# Patient Record
Sex: Male | Born: 1975 | Race: White | Hispanic: No | Marital: Married | Smoking: Never smoker
Health system: Southern US, Community
[De-identification: ages and names within clinical notes are randomized; demographics above are authoritative.]

## PROBLEM LIST (undated history)

## (undated) DIAGNOSIS — F32A Depression, unspecified: Secondary | ICD-10-CM

## (undated) DIAGNOSIS — I10 Essential (primary) hypertension: Secondary | ICD-10-CM

## (undated) DIAGNOSIS — C801 Malignant (primary) neoplasm, unspecified: Secondary | ICD-10-CM

## (undated) DIAGNOSIS — I951 Orthostatic hypotension: Secondary | ICD-10-CM

## (undated) DIAGNOSIS — I341 Nonrheumatic mitral (valve) prolapse: Secondary | ICD-10-CM

## (undated) DIAGNOSIS — R011 Cardiac murmur, unspecified: Secondary | ICD-10-CM

## (undated) DIAGNOSIS — R634 Abnormal weight loss: Secondary | ICD-10-CM

## (undated) DIAGNOSIS — S3992XA Unspecified injury of lower back, initial encounter: Secondary | ICD-10-CM

## (undated) DIAGNOSIS — K219 Gastro-esophageal reflux disease without esophagitis: Secondary | ICD-10-CM

## (undated) DIAGNOSIS — G629 Polyneuropathy, unspecified: Secondary | ICD-10-CM

## (undated) DIAGNOSIS — Z923 Personal history of irradiation: Secondary | ICD-10-CM

## (undated) HISTORY — DX: Depression, unspecified: F32.A

## (undated) HISTORY — PX: ELBOW FRACTURE SURGERY: SHX616

## (undated) HISTORY — DX: Malignant (primary) neoplasm, unspecified: C80.1

## (undated) HISTORY — DX: Nonrheumatic mitral (valve) prolapse: I34.1

## (undated) HISTORY — DX: Unspecified injury of lower back, initial encounter: S39.92XA

## (undated) HISTORY — PX: ELBOW SURGERY: SHX618

## (undated) HISTORY — DX: Personal history of irradiation: Z92.3

---

## 2002-10-16 DIAGNOSIS — S3992XA Unspecified injury of lower back, initial encounter: Secondary | ICD-10-CM

## 2002-10-16 HISTORY — DX: Unspecified injury of lower back, initial encounter: S39.92XA

## 2012-10-22 ENCOUNTER — Emergency Department: Payer: Self-pay | Admitting: Emergency Medicine

## 2012-10-22 LAB — CBC
HGB: 15.7 g/dL (ref 13.0–18.0)
Platelet: 254 10*3/uL (ref 150–440)
RBC: 5.21 10*6/uL (ref 4.40–5.90)
RDW: 12.7 % (ref 11.5–14.5)
WBC: 9.6 10*3/uL (ref 3.8–10.6)

## 2012-10-22 LAB — BASIC METABOLIC PANEL
BUN: 16 mg/dL (ref 7–18)
Chloride: 103 mmol/L (ref 98–107)
Co2: 27 mmol/L (ref 21–32)
Creatinine: 1.04 mg/dL (ref 0.60–1.30)
EGFR (African American): >60
Glucose: 113 mg/dL — ABNORMAL HIGH (ref 65–99)
Osmolality: 281 (ref 275–301)
Potassium: 3.9 mmol/L (ref 3.5–5.1)
Sodium: 140 mmol/L (ref 136–145)

## 2012-10-22 LAB — TROPONIN I: Troponin-I: <0.02 ng/mL

## 2014-01-07 ENCOUNTER — Emergency Department: Payer: Self-pay | Admitting: Emergency Medicine

## 2015-09-09 ENCOUNTER — Encounter (HOSPITAL_COMMUNITY): Payer: Self-pay | Admitting: Emergency Medicine

## 2015-09-09 ENCOUNTER — Emergency Department (HOSPITAL_COMMUNITY)
Admission: EM | Admit: 2015-09-09 | Discharge: 2015-09-09 | Disposition: A | Discharge status: Home / Self Care | Payer: Worker's Compensation | Attending: Emergency Medicine | Admitting: Emergency Medicine

## 2015-09-09 ENCOUNTER — Emergency Department (HOSPITAL_COMMUNITY): Payer: Worker's Compensation

## 2015-09-09 DIAGNOSIS — W208XXA Other cause of strike by thrown, projected or falling object, initial encounter: Secondary | ICD-10-CM | POA: Insufficient documentation

## 2015-09-09 DIAGNOSIS — Y9389 Activity, other specified: Secondary | ICD-10-CM | POA: Diagnosis not present

## 2015-09-09 DIAGNOSIS — Y998 Other external cause status: Secondary | ICD-10-CM | POA: Diagnosis not present

## 2015-09-09 DIAGNOSIS — S99922A Unspecified injury of left foot, initial encounter: Secondary | ICD-10-CM | POA: Diagnosis present

## 2015-09-09 DIAGNOSIS — Y9289 Other specified places as the place of occurrence of the external cause: Secondary | ICD-10-CM | POA: Diagnosis not present

## 2015-09-09 DIAGNOSIS — Z8679 Personal history of other diseases of the circulatory system: Secondary | ICD-10-CM | POA: Diagnosis not present

## 2015-09-09 DIAGNOSIS — S9032XA Contusion of left foot, initial encounter: Secondary | ICD-10-CM | POA: Diagnosis not present

## 2015-09-09 HISTORY — DX: Nonrheumatic mitral (valve) prolapse: I34.1

## 2015-09-09 MED ORDER — HYDROCODONE-ACETAMINOPHEN 5-325 MG PO TABS: 2.0000 | ORAL_TABLET | ORAL | Status: DC | PRN

## 2015-09-09 MED ORDER — ACETAMINOPHEN 325 MG PO TABS
650.0000 mg | ORAL_TABLET | Freq: Once | ORAL | Status: AC
  Administered 2015-09-09: 650 mg via ORAL
  Filled 2015-09-09: qty 2

## 2015-09-09 NOTE — Discharge Instructions (Signed)
Cryotherapy Cryotherapy means treatment with cold. Ice or gel packs can be used to reduce both pain and swelling. Ice is the most helpful within the first 24 to 48 hours after an injury or flare-up from overusing a muscle or joint. Sprains, strains, spasms, burning pain, shooting pain, and aches can all be eased with ice. Ice can also be used when recovering from surgery. Ice is effective, has very few side effects, and is safe for most people to use. PRECAUTIONS  Ice is not a safe treatment option for people with:  Raynaud phenomenon. This is a condition affecting small blood vessels in the extremities. Exposure to cold may cause your problems to return.  Cold hypersensitivity. There are many forms of cold hypersensitivity, including:  Cold urticaria. Red, itchy hives appear on the skin when the tissues begin to warm after being iced.  Cold erythema. This is a red, itchy rash caused by exposure to cold.  Cold hemoglobinuria. Red blood cells break down when the tissues begin to warm after being iced. The hemoglobin that carry oxygen are passed into the urine because they cannot combine with blood proteins fast enough.  Numbness or altered sensitivity in the area being iced. If you have any of the following conditions, do not use ice until you have discussed cryotherapy with your caregiver:  Heart conditions, such as arrhythmia, angina, or chronic heart disease.  High blood pressure.  Healing wounds or open skin in the area being iced.  Current infections.  Rheumatoid arthritis.  Poor circulation.  Diabetes. Ice slows the blood flow in the region it is applied. This is beneficial when trying to stop inflamed tissues from spreading irritating chemicals to surrounding tissues. However, if you expose your skin to cold temperatures for too long or without the proper protection, you can damage your skin or nerves. Watch for signs of skin damage due to cold. HOME CARE INSTRUCTIONS Follow  these tips to use ice and cold packs safely.  Place a dry or damp towel between the ice and skin. A damp towel will cool the skin more quickly, so you may need to shorten the time that the ice is used.  For a more rapid response, add gentle compression to the ice.  Ice for no more than 10 to 20 minutes at a time. The bonier the area you are icing, the less time it will take to get the benefits of ice.  Check your skin after 5 minutes to make sure there are no signs of a poor response to cold or skin damage.  Rest 20 minutes or more between uses.  Once your skin is numb, you can end your treatment. You can test numbness by very lightly touching your skin. The touch should be so light that you do not see the skin dimple from the pressure of your fingertip. When using ice, most people will feel these normal sensations in this order: cold, burning, aching, and numbness.  Do not use ice on someone who cannot communicate their responses to pain, such as small children or people with dementia. HOW TO MAKE AN ICE PACK Ice packs are the most common way to use ice therapy. Other methods include ice massage, ice baths, and cryosprays. Muscle creams that cause a cold, tingly feeling do not offer the same benefits that ice offers and should not be used as a substitute unless recommended by your caregiver. To make an ice pack, do one of the following:  Place crushed ice or a  bag of frozen vegetables in a sealable plastic bag. Squeeze out the excess air. Place this bag inside another plastic bag. Slide the bag into a pillowcase or place a damp towel between your skin and the bag.  Mix 3 parts water with 1 part rubbing alcohol. Freeze the mixture in a sealable plastic bag. When you remove the mixture from the freezer, it will be slushy. Squeeze out the excess air. Place this bag inside another plastic bag. Slide the bag into a pillowcase or place a damp towel between your skin and the bag. SEEK MEDICAL CARE  IF:  You develop white spots on your skin. This may give the skin a blotchy (mottled) appearance.  Your skin turns blue or pale.  Your skin becomes waxy or hard.  Your swelling gets worse. MAKE SURE YOU:   Understand these instructions.  Will watch your condition.  Will get help right away if you are not doing well or get worse.   This information is not intended to replace advice given to you by your health care provider. Make sure you discuss any questions you have with your health care provider.   Document Released: 05/29/2011 Document Revised: 10/23/2014 Document Reviewed: 05/29/2011 Elsevier Interactive Patient Education 2016 [place] Contusion A foot contusion is a deep bruise to the foot. Contusions are the result of an injury that caused bleeding under the skin. The contusion may turn blue, purple, or yellow. Minor injuries will give you a painless contusion, but more severe contusions may stay painful and swollen for a few weeks. CAUSES  A foot contusion comes from a direct blow to that area, such as a heavy object falling on the foot. SYMPTOMS   Swelling of the foot.  Discoloration of the foot.  Tenderness or soreness of the foot. DIAGNOSIS  You will have a physical exam and will be asked about your history. You may need an X-ray of your foot to look for a broken bone (fracture).  TREATMENT  An elastic wrap may be recommended to support your foot. Resting, elevating, and applying cold compresses to your foot are often the best treatments for a foot contusion. Over-the-counter medicines may also be recommended for pain control. HOME CARE INSTRUCTIONS   Put ice on the injured area.  Put ice in a plastic bag.  Place a towel between your skin and the bag.  Leave the ice on for 15-20 minutes, 03-04 times a day.  Only take over-the-counter or prescription medicines for pain, discomfort, or fever as directed by your caregiver.  If told, use an elastic  wrap as directed. This can help reduce swelling. You may remove the wrap for sleeping, showering, and bathing. If your toes become numb, cold, or blue, take the wrap off and reapply it more loosely.  Elevate your foot with pillows to reduce swelling.  Try to avoid standing or walking while the foot is painful. Do not resume use until instructed by your caregiver. Then, begin use gradually. If pain develops, decrease use. Gradually increase activities that do not cause discomfort until you have normal use of your foot.  See your caregiver as directed. It is very important to keep all follow-up appointments in order to avoid any lasting problems with your foot, including long-term (chronic) pain. SEEK IMMEDIATE MEDICAL CARE IF:   You have increased redness, swelling, or pain in your foot.  Your swelling or pain is not relieved with medicines.  You have loss of feeling in your foot  or are unable to move your toes.  Your foot turns cold or blue.  You have pain when you move your toes.  Your foot becomes warm to the touch.  Your contusion does not improve in 2 days. MAKE SURE YOU:   Understand these instructions.  Will watch your condition.  Will get help right away if you are not doing well or get worse.   This information is not intended to replace advice given to you by your health care provider. Make sure you discuss any questions you have with your health care provider.   Document Released: 07/24/2006 Document Revised: 04/02/2012 Document Reviewed: 06/08/2015 Elsevier Interactive Patient Education [employer].

## 2015-09-09 NOTE — ED Notes (Signed)
Pt c/o left foot injury onset today 0700 after a heavy object landed on his distal foot which was hanging off the end of a curb. Motor function, sensation intact, pedal pulses 2+.

## 2015-09-09 NOTE — ED Notes (Signed)
Patient transported to X-ray 
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
## 2015-10-17 HISTORY — PX: TONSILLECTOMY: SHX28A

## 2016-03-03 ENCOUNTER — Inpatient Hospital Stay: Admit: 2016-03-03 | Discharge: 2016-03-03 | Disposition: A | Discharge status: Home / Self Care | Payer: Self-pay

## 2016-03-03 ENCOUNTER — Other Ambulatory Visit: Payer: Self-pay | Admitting: Gastroenterology

## 2016-03-03 ENCOUNTER — Ambulatory Visit
Admission: RE | Admit: 2016-03-03 | Discharge: 2016-03-03 | Disposition: A | Discharge status: Home / Self Care | Payer: Managed Care, Other (non HMO) | Source: Ambulatory Visit | Attending: Urgent Care | Admitting: Urgent Care

## 2016-03-03 ENCOUNTER — Other Ambulatory Visit: Payer: Self-pay | Admitting: Urgent Care

## 2016-03-03 DIAGNOSIS — R220 Localized swelling, mass and lump, head: Secondary | ICD-10-CM

## 2016-03-03 DIAGNOSIS — R221 Localized swelling, mass and lump, neck: Secondary | ICD-10-CM | POA: Diagnosis not present

## 2016-03-03 DIAGNOSIS — R590 Localized enlarged lymph nodes: Secondary | ICD-10-CM | POA: Insufficient documentation

## 2016-03-03 MED ORDER — IOPAMIDOL (ISOVUE-300) INJECTION 61%
75.0000 mL | Freq: Once | INTRAVENOUS | Status: AC | PRN
  Administered 2016-03-03: 75 mL via INTRAVENOUS

## 2016-03-12 ENCOUNTER — Inpatient Hospital Stay: Admit: 2016-03-12 | Discharge: 2016-03-12 | Disposition: A | Discharge status: Home / Self Care | Payer: Self-pay

## 2016-03-12 ENCOUNTER — Encounter (HOSPITAL_COMMUNITY): Payer: Self-pay | Admitting: *Deleted

## 2016-03-12 ENCOUNTER — Emergency Department (HOSPITAL_COMMUNITY)
Admission: EM | Admit: 2016-03-12 | Discharge: 2016-03-12 | Disposition: A | Discharge status: Home / Self Care | Payer: Managed Care, Other (non HMO) | Attending: Emergency Medicine | Admitting: Emergency Medicine

## 2016-03-12 ENCOUNTER — Emergency Department (HOSPITAL_COMMUNITY): Payer: Managed Care, Other (non HMO)

## 2016-03-12 DIAGNOSIS — Z791 Long term (current) use of non-steroidal anti-inflammatories (NSAID): Secondary | ICD-10-CM | POA: Insufficient documentation

## 2016-03-12 DIAGNOSIS — R079 Chest pain, unspecified: Secondary | ICD-10-CM | POA: Diagnosis present

## 2016-03-12 DIAGNOSIS — R590 Localized enlarged lymph nodes: Secondary | ICD-10-CM | POA: Diagnosis not present

## 2016-03-12 LAB — CBC
HCT: 41.6 % (ref 39.0–52.0)
Hemoglobin: 14.4 g/dL (ref 13.0–17.0)
MCH: 29.8 pg (ref 26.0–34.0)
MCHC: 34.6 g/dL (ref 30.0–36.0)
MCV: 86.1 fL (ref 78.0–100.0)
PLATELETS: 227 10*3/uL (ref 150–400)
RBC: 4.83 MIL/uL (ref 4.22–5.81)
RDW: 12.5 % (ref 11.5–15.5)
WBC: 7.4 10*3/uL (ref 4.0–10.5)

## 2016-03-12 LAB — RAPID STREP SCREEN (MED CTR MEBANE ONLY): Streptococcus, Group A Screen (Direct): NEGATIVE

## 2016-03-12 LAB — BASIC METABOLIC PANEL
Anion gap: 6 (ref 5–15)
BUN: 20 mg/dL (ref 6–20)
CALCIUM: 9.4 mg/dL (ref 8.9–10.3)
CO2: 28 mmol/L (ref 22–32)
Chloride: 104 mmol/L (ref 101–111)
Creatinine, Ser: 0.98 mg/dL (ref 0.61–1.24)
GFR calc Af Amer: >60 mL/min (ref >60)
GLUCOSE: 118 mg/dL — AB (ref 65–99)
Potassium: 3.4 mmol/L — ABNORMAL LOW (ref 3.5–5.1)
SODIUM: 138 mmol/L (ref 135–145)

## 2016-03-12 LAB — I-STAT TROPONIN, ED
TROPONIN I, POC: 0 ng/mL (ref 0.00–0.08)
Troponin i, poc: 0 ng/mL (ref 0.00–0.08)

## 2016-03-12 LAB — MONONUCLEOSIS SCREEN: Mono Screen: NEGATIVE

## 2016-03-12 MED ORDER — KETOROLAC TROMETHAMINE 30 MG/ML IJ SOLN: 30.0000 mg | Freq: Once | INTRAMUSCULAR | Status: DC

## 2016-03-12 MED ORDER — NAPROXEN 500 MG PO TABS: 500.0000 mg | ORAL_TABLET | Freq: Two times a day (BID) | ORAL | Status: DC

## 2016-03-12 MED ORDER — AMOXICILLIN 500 MG PO CAPS
500.0000 mg | ORAL_CAPSULE | Freq: Once | ORAL | Status: AC
  Administered 2016-03-12: 500 mg via ORAL
  Filled 2016-03-12: qty 1

## 2016-03-12 MED ORDER — AMOXICILLIN 500 MG PO CAPS: 500.0000 mg | ORAL_CAPSULE | Freq: Three times a day (TID) | ORAL | Status: DC

## 2016-03-12 MED ORDER — IOPAMIDOL (ISOVUE-370) INJECTION 76%
100.0000 mL | Freq: Once | INTRAVENOUS | Status: AC | PRN
  Administered 2016-03-12: 100 mL via INTRAVENOUS

## 2016-03-12 NOTE — ED Notes (Signed)
Pt states he was trying to go to sleep tonight and he was lying on his side, started having cp and sob. Pt also concerned about lump he has on the left side of his neck. Denies fevers and 1 episode of vomiting.

## 2016-03-12 NOTE — ED Provider Notes (Signed)
CSN: [id number]     Arrival date & time 03/12/16  0301 History  By signing my name below, I, [name], attest that this documentation has been prepared under the direction and in the presence of Devynne Sturdivant, MD.  Electronically Signed: Tedra Coupe. Sheppard Coil, ED Scribe. 03/12/2016. 3:13 AM.    No chief complaint on file.   Patient is a 40 y.o. male presenting with chest pain. The history is provided by the patient. No language interpreter was used.  Chest Pain Pain location:  L chest Pain quality: sharp   Pain radiates to:  Does not radiate Pain radiates to the back: no   Pain severity:  Moderate Onset quality:  Sudden Duration:  2 hours Timing:  Intermittent Progression:  Partially resolved Chronicity:  New Worsened by:  Nothing tried Associated symptoms: no altered mental status, no cough, no dizziness, no fever, no lower extremity edema, no palpitations, no syncope, not vomiting and no weakness   Risk factors: male sex   Risk factors: no aortic disease     HPI Comments: [name] is a 40 y.o. male who presents to the Emergency Department complaining of sudden onset, intermittent, sharp chest pain with associated mild shortness of breath which came on PTA. Pt notes that while he was trying to sleep, he started having chest pain and shortness of breath. He reports possible weight loss of approximately 8-10 pounds. He admits to being pain-free at this time. He also reports left sided neck pain with a noted swollen "mass" to the left neck. He states his neck is warm to the touch and slightly numb surrounding mass. He reports having a headache for the past 3 days with no relief from medication. Pt denies any fever or swelling in legs.   Past Medical History  Diagnosis Date  . Mitral valve prolapse    No past surgical history on file. No family history on file. Social History  Substance Use Topics  . Smoking status: Not on file  . Smokeless tobacco: Not on file  .  Alcohol Use: Not on file    Review of Systems  Constitutional: Negative for fever and unexpected weight change.  HENT: Negative for sore throat and voice change.   Respiratory: Negative for cough.   Cardiovascular: Positive for chest pain. Negative for palpitations, leg swelling and syncope.  Gastrointestinal: Negative for vomiting and diarrhea.  Musculoskeletal: Positive for neck pain.  Neurological: Negative for dizziness and weakness.  All other systems reviewed and are negative.    Allergies  Review of patient's allergies indicates no known allergies.  Home Medications   Prior to Admission medications   Medication Sig Start Date End Date Taking? Authorizing Provider  HYDROcodone-acetaminophen (NORCO/VICODIN) 5-325 MG tablet Take 2 tablets by mouth every 4 (four) hours as needed. 09/09/15   Lacretia Leigh, MD  ibuprofen (ADVIL,MOTRIN) 200 MG tablet Take 200 mg by mouth every 6 (six) hours as needed for moderate pain.    Historical Provider, MD   There were no vitals taken for this visit. Physical Exam  Constitutional: He is oriented to person, place, and time. He appears well-developed and well-nourished.  HENT:  Head: Normocephalic and atraumatic.  Eyes: EOM are normal. Pupils are equal, round, and reactive to light.  Neck: Normal range of motion.  2xm, freely mobile mass on left neck  Cardiovascular: Normal rate, regular rhythm, normal heart sounds and intact distal pulses.   Pulmonary/Chest: Effort normal and breath sounds normal. No respiratory distress. He has  no wheezes. He has no rales.  Abdominal: Soft. Bowel sounds are normal. He exhibits no distension. There is no tenderness. There is no rebound and no guarding.  Gassy.   Musculoskeletal: Normal range of motion.  Lymphadenopathy:       Head (right side): No submental, no submandibular, no tonsillar, no preauricular, no posterior auricular and no occipital adenopathy present.       Head (left side): No submental, no  submandibular, no tonsillar, no preauricular, no posterior auricular and no occipital adenopathy present.    He has cervical adenopathy.       Right cervical: No superficial cervical and no deep cervical adenopathy present.      Left cervical: Superficial cervical adenopathy present. No deep cervical and no posterior cervical adenopathy present.    He has no axillary adenopathy.       Right: No inguinal, no supraclavicular and no epitrochlear adenopathy present.       Left: No inguinal, no supraclavicular and no epitrochlear adenopathy present.  Neurological: He is alert and oriented to person, place, and time.  Skin: Skin is warm and dry. No rash noted.  Psychiatric: He has a normal mood and affect. Judgment normal.  Nursing note and vitals reviewed.   ED Course  Procedures (including critical care time) DIAGNOSTIC STUDIES: Oxygen Saturation is 100% on RA, normal by my interpretation.    COORDINATION OF CARE: 3:13 AM-Discussed treatment plan which includes strep test, blood works, EKG, and CT with contrast  with pt at bedside and pt agreed to plan. Will receive Toradol.  Labs Review Labs Reviewed - No data to display  Imaging Review No results found. I have personally reviewed and evaluated these images and lab results as part of my medical decision-making.   EKG Interpretation None      MDM   Final diagnoses:  None    EKG Interpretation  Date/Time:    Ventricular Rate:    PR Interval:    QRS Duration:   QT Interval:    QTC Calculation:   R Axis:     Text Interpretation:        Results for orders placed or performed in visit on 10/22/12  CBC  Result Value Ref Range   WBC 9.6 3.8-10.6 x10 3/mm 3   RBC 5.21 4.40-5.90 x10 6/mm 3   HGB 15.7 13.0-18.0 g/dL   HCT 46.1 40.0-52.0 %   MCV 88 80-100 fL   MCH 30.2 26.0-34.0 pg   MCHC 34.1 32.0-36.0 g/dL   RDW 12.7 11.5-14.5 %   Platelet 254 150-440 x10 3/mm 3  Basic metabolic panel  Result Value Ref Range    Glucose 113 (H) 65-99 mg/dL   BUN 16 7-18 mg/dL   Creatinine 1.04 0.60-1.30 mg/dL   Sodium 140 136-145 mmol/L   Potassium 3.9 3.5-5.1 mmol/L   Chloride 103 98-107 mmol/L   Co2 27 21-32 mmol/L   Calcium, Total 8.8 8.5-10.1 mg/dL   Osmolality 281 275-301   Anion Gap 10 7-16   EGFR (African American) >60    EGFR (Non-African Amer.) >60   Troponin I  Result Value Ref Range   Troponin-I < 0.02 ng/mL   Ct Soft Tissue Neck W Contrast  03/03/2016  CLINICAL DATA:  Swelling in the left neck for 3 days. States similar symptoms previously when sick. EXAM: CT NECK WITH CONTRAST TECHNIQUE: Multidetector CT imaging of the neck was performed using the standard protocol following the bolus administration of intravenous contrast. CONTRAST:  3m ISOVUE-300  IOPAMIDOL (ISOVUE-300) INJECTION 61% COMPARISON:  None. FINDINGS: Pharynx and larynx: Symmetric prominence of palatine and lingual tonsils with tonsillith in the left tonsillar fossa. There is no definitive mass lesion. The larynx is deviated to the right compared to the hyoid with mildly concave appearance of the left thyroidal cartilage, favored posttraumatic. No airway narrowing. Salivary glands: Negative Thyroid: Sub cm cyst or nodule in the left lobe that is too small for follow-up purposes. Lymph nodes: Enlarged left jugulodigastric node with fairly homogeneous enhancement. The rounded node measures 18 mm in diameter and 29 mm length. Vascular: Negative Limited intracranial: Negative Visualized orbits: Negative Mastoids and visualized paranasal sinuses: Clear. Notable leftward nasal septal spurring contacting the middle turbinate. Skeleton: Remote T4 superior endplate fracture. No acute or aggressive process. Upper chest: Clear apical lungs.  Negative upper mediastinum. IMPRESSION: Left jugulodigastric adenopathy correlating with palpable complaint. If there is pharyngitis/URI (tonsils are symmetrically prominent for age) this could be a reactive node. If no  associated illness or if node persists, recommend biopsy to exclude lymphoma or metastasis. Electronically Signed   By: Monte Fantasia M.D.   On: 03/03/2016 17:10    Medications  ketorolac (TORADOL) 30 MG/ML injection 30 mg (not administered)   Filed Vitals:   03/12/16 0318  BP: 147/95  Pulse: 82  Temp: 98 F (36.7 C)  Resp: 16      Date: 03/12/2016  Rate: 80  Rhythm: normal sinus rhythm  QRS Axis: normal  Intervals: normal  ST/T Wave abnormalities: normal  Conduction Disutrbances: none  Narrative Interpretation: unremarkable  Medications  ketorolac (TORADOL) 30 MG/ML injection 30 mg (30 mg Intravenous Not Given 03/12/16 0457)  amoxicillin (AMOXIL) capsule 500 mg (not administered)  iopamidol (ISOVUE-370) 76 % injection 100 mL (100 mLs Intravenous Contrast Given 03/12/16 0452)    Patient is well appearing and has normal vitals and a full lymph exam was conducted and there were no additional lymph nodes.  I suspect this node is reactive, likely from an infection.  Will start NSAIDs and amoxicillin for 7 days.  Will have patient follow up with ENT for potential biopsy as directed on neck CT.  Ruled out for MI in the Ed with a normal EKG and 2 normal delta troponins.  HEART score is 1 and patient is stable for discharge.  Follow up with your PMD for ongoing care.    I personally performed the services described in this documentation, which was scribed in my presence. The recorded information has been reviewed and is accurate.      Veatrice Kells, MD 03/12/16 [phone]

## 2016-03-12 NOTE — ED Notes (Signed)
EKG given to EDP,Palumbo,MD., for review. 

## 2016-03-12 NOTE — ED Notes (Signed)
Pt returned from ct

## 2016-03-12 NOTE — Discharge Instructions (Signed)

## 2016-03-14 LAB — CULTURE, GROUP A STREP (THRC)

## 2016-03-27 ENCOUNTER — Other Ambulatory Visit: Payer: Self-pay | Admitting: Otolaryngology

## 2016-04-03 ENCOUNTER — Other Ambulatory Visit (HOSPITAL_COMMUNITY): Payer: Self-pay | Admitting: Otolaryngology

## 2016-04-03 DIAGNOSIS — R221 Localized swelling, mass and lump, neck: Secondary | ICD-10-CM

## 2016-04-04 ENCOUNTER — Telehealth: Payer: Self-pay | Admitting: Hematology and Oncology

## 2016-04-04 NOTE — Telephone Encounter (Signed)
Lt mess for referring office regarding pt review and evaulations

## 2016-04-07 ENCOUNTER — Inpatient Hospital Stay: Admit: 2016-04-07 | Discharge: 2016-04-07 | Disposition: A | Discharge status: Home / Self Care | Payer: Self-pay

## 2016-04-07 ENCOUNTER — Other Ambulatory Visit: Payer: Self-pay | Admitting: Gastroenterology

## 2016-04-07 ENCOUNTER — Telehealth: Payer: Self-pay | Admitting: *Deleted

## 2016-04-07 ENCOUNTER — Encounter (HOSPITAL_COMMUNITY)
Admission: RE | Admit: 2016-04-07 | Discharge: 2016-04-07 | Disposition: A | Discharge status: Home / Self Care | Payer: Managed Care, Other (non HMO) | Source: Ambulatory Visit | Attending: Otolaryngology | Admitting: Otolaryngology

## 2016-04-07 DIAGNOSIS — R221 Localized swelling, mass and lump, neck: Secondary | ICD-10-CM | POA: Insufficient documentation

## 2016-04-07 LAB — GLUCOSE, CAPILLARY: GLUCOSE-CAPILLARY: 116 mg/dL — AB (ref 65–99)

## 2016-04-07 MED ORDER — FLUDEOXYGLUCOSE F - 18 (FDG) INJECTION
8.8000 | Freq: Once | INTRAVENOUS | Status: AC | PRN
  Administered 2016-04-07: 8.8 via INTRAVENOUS

## 2016-04-07 NOTE — Telephone Encounter (Signed)
  Oncology Nurse Navigator Documentation  Navigator Location: [facility] (04/07/16 1302) Navigator Encounter Type: Introductory phone call;Telephone (04/07/16 1302) Telephone: Appt Confirmation/Clarification (04/07/16 1302)             Barriers/Navigation Needs: Coordination of Care (04/07/16 1302)       Placed introductory call to new referral patient [name].  Introduced myself as the H&N oncology nurse navigator that works with Drs Alvy Bimler and Isidore Moos to whom he has been referred by Dr. Wilburn Cornelia.  He confirmed his understanding of referral; he confirmed availability to see Dr. Alvy Bimler 7/7 11:45 followed by NE 1:30, Dr. Isidore Moos 2:00.  I briefly explained my role as his navigator, indicated that I would be joining him during his appts.  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 I explained the purpose of a dental evaluation prior to starting RT, indicated he wd be contacted by [facility] to arrange an appt.   I provided my contact information, encouraged him to call with questions/concerns before his appointments.  He verbalized understanding of information provided, expressed appreciation for my call.  [name], RN, BSN, [place] [facility] [place] [phone]                        Time Spent with Patient: 45 (04/07/16 1302)

## 2016-04-07 NOTE — Telephone Encounter (Signed)
  Oncology Nurse Navigator Documentation  Navigator Location: [facility] (04/07/16 1427) Navigator Encounter Type: Telephone (04/07/16 1427)                   Interventions: Coordination of Care (04/07/16 1427)       Called ENT Dr. Victorio Palm office, [facility] with request he order CT Neck with Contrast.  [name], RN, BSN, [place] [facility] [place] [phone]                   Time Spent with Patient: 15 (04/07/16 1427)

## 2016-04-10 ENCOUNTER — Other Ambulatory Visit: Payer: Self-pay | Admitting: Radiation Oncology

## 2016-04-10 ENCOUNTER — Other Ambulatory Visit (HOSPITAL_COMMUNITY): Payer: Self-pay

## 2016-04-11 ENCOUNTER — Encounter (HOSPITAL_BASED_OUTPATIENT_CLINIC_OR_DEPARTMENT_OTHER): Payer: Self-pay | Admitting: *Deleted

## 2016-04-11 ENCOUNTER — Telehealth: Payer: Self-pay | Admitting: *Deleted

## 2016-04-11 NOTE — Telephone Encounter (Signed)
  Oncology Nurse Navigator Documentation  Navigator Location: [facility] (04/11/16 1502) Navigator Encounter Type: Telephone (04/11/16 1502) Telephone: [name] Confirmation/Clarification (04/11/16 1502)                  Informed Mr. [name] that per Drs. Ann Lions, chemotherapy will not be part of his treatment plan.  He expressed appreciation for the information.  He informed me that he was just notified by Dr. Victorio Palm office that he is scheduled this Friday for tonsillectomy with biopsy, including biopsy of tongue and other tissues.    Information forwarded to Dr. Isidore Moos.  [name], RN, BSN, [place] [facility] [place] [phone]                          Time Spent with Patient: 15 (04/11/16 1502)

## 2016-04-13 ENCOUNTER — Other Ambulatory Visit: Payer: Self-pay | Admitting: Otolaryngology

## 2016-04-14 ENCOUNTER — Encounter: Payer: Self-pay | Admitting: Gastroenterology

## 2016-04-14 ENCOUNTER — Ambulatory Visit (HOSPITAL_BASED_OUTPATIENT_CLINIC_OR_DEPARTMENT_OTHER): Payer: Managed Care, Other (non HMO) | Admitting: Certified Registered"

## 2016-04-14 ENCOUNTER — Ambulatory Visit (HOSPITAL_BASED_OUTPATIENT_CLINIC_OR_DEPARTMENT_OTHER)
Admission: RE | Admit: 2016-04-14 | Discharge: 2016-04-14 | Disposition: A | Discharge status: Home / Self Care | Payer: Managed Care, Other (non HMO) | Source: Ambulatory Visit | Attending: Otolaryngology | Admitting: Otolaryngology

## 2016-04-14 ENCOUNTER — Encounter (HOSPITAL_BASED_OUTPATIENT_CLINIC_OR_DEPARTMENT_OTHER): Payer: Self-pay | Admitting: Certified Registered"

## 2016-04-14 ENCOUNTER — Encounter (HOSPITAL_BASED_OUTPATIENT_CLINIC_OR_DEPARTMENT_OTHER)
Admission: RE | Disposition: A | Discharge status: Home / Self Care | Payer: Self-pay | Source: Ambulatory Visit | Attending: Otolaryngology

## 2016-04-14 DIAGNOSIS — K219 Gastro-esophageal reflux disease without esophagitis: Secondary | ICD-10-CM | POA: Insufficient documentation

## 2016-04-14 DIAGNOSIS — C112 Malignant neoplasm of lateral wall of nasopharynx: Secondary | ICD-10-CM | POA: Diagnosis not present

## 2016-04-14 DIAGNOSIS — C770 Secondary and unspecified malignant neoplasm of lymph nodes of head, face and neck: Secondary | ICD-10-CM

## 2016-04-14 DIAGNOSIS — R221 Localized swelling, mass and lump, neck: Secondary | ICD-10-CM | POA: Diagnosis present

## 2016-04-14 HISTORY — DX: Abnormal weight loss: R63.4

## 2016-04-14 HISTORY — PX: NASOPHARYNGOSCOPY: SHX5210

## 2016-04-14 HISTORY — PX: TONSILLECTOMY: SHX5217

## 2016-04-14 HISTORY — DX: Gastro-esophageal reflux disease without esophagitis: K21.9

## 2016-04-14 SURGERY — TONSILLECTOMY
Anesthesia: General | Site: Mouth | Laterality: Left

## 2016-04-14 MED ORDER — DEXAMETHASONE SODIUM PHOSPHATE 10 MG/ML IJ SOLN
10.0000 mg | Freq: Once | INTRAMUSCULAR | Status: AC
  Administered 2016-04-14: 10 mg via INTRAVENOUS
  Filled 2016-04-14: qty 1

## 2016-04-14 MED ORDER — LIDOCAINE-EPINEPHRINE 1 %-1:100000 IJ SOLN
INTRAMUSCULAR | Status: AC
  Filled 2016-04-14: qty 1

## 2016-04-14 MED ORDER — SUCCINYLCHOLINE CHLORIDE 200 MG/10ML IV SOSY
PREFILLED_SYRINGE | INTRAVENOUS | Status: AC
  Filled 2016-04-14: qty 10

## 2016-04-14 MED ORDER — HYDROCODONE-ACETAMINOPHEN 7.5-325 MG/15ML PO SOLN
10.0000 mL | ORAL | Status: DC | PRN
  Administered 2016-04-14 (×2): 15 mL via ORAL
  Filled 2016-04-14 (×2): qty 15

## 2016-04-14 MED ORDER — ONDANSETRON HCL 4 MG/2ML IJ SOLN
INTRAMUSCULAR | Status: AC
  Filled 2016-04-14: qty 2

## 2016-04-14 MED ORDER — SCOPOLAMINE 1 MG/3DAYS TD PT72: 1.0000 | MEDICATED_PATCH | Freq: Once | TRANSDERMAL | Status: DC | PRN

## 2016-04-14 MED ORDER — CEFAZOLIN SODIUM-DEXTROSE 2-4 GM/100ML-% IV SOLN
INTRAVENOUS | Status: AC
  Filled 2016-04-14: qty 100

## 2016-04-14 MED ORDER — CEFAZOLIN SODIUM-DEXTROSE 2-3 GM-% IV SOLR
INTRAVENOUS | Status: DC | PRN
  Administered 2016-04-14: 2 g via INTRAVENOUS

## 2016-04-14 MED ORDER — OXYMETAZOLINE HCL 0.05 % NA SOLN
NASAL | Status: AC
  Filled 2016-04-14: qty 15

## 2016-04-14 MED ORDER — PROMETHAZINE HCL 25 MG/ML IJ SOLN: 6.2500 mg | INTRAMUSCULAR | Status: DC | PRN

## 2016-04-14 MED ORDER — EPINEPHRINE HCL 1 MG/ML IJ SOLN
INTRAMUSCULAR | Status: AC
  Filled 2016-04-14: qty 1

## 2016-04-14 MED ORDER — PHENOL 1.4 % MT LIQD
1.0000 | OROMUCOSAL | Status: DC | PRN
  Administered 2016-04-14: 1 via OROMUCOSAL
  Filled 2016-04-14: qty 177

## 2016-04-14 MED ORDER — HYDROMORPHONE HCL 1 MG/ML IJ SOLN
0.2500 mg | INTRAMUSCULAR | Status: DC | PRN
  Administered 2016-04-14 (×2): 0.5 mg via INTRAVENOUS

## 2016-04-14 MED ORDER — AMOXICILLIN-POT CLAVULANATE 250-62.5 MG/5ML PO SUSR: 10.0000 mL | Freq: Two times a day (BID) | ORAL | Status: DC

## 2016-04-14 MED ORDER — ONDANSETRON HCL 4 MG PO TABS: 4.0000 mg | ORAL_TABLET | ORAL | Status: DC | PRN

## 2016-04-14 MED ORDER — DEXAMETHASONE SODIUM PHOSPHATE 10 MG/ML IJ SOLN
INTRAMUSCULAR | Status: AC
  Filled 2016-04-14: qty 1

## 2016-04-14 MED ORDER — CHLORHEXIDINE GLUCONATE CLOTH 2 % EX PADS: 6.0000 | MEDICATED_PAD | Freq: Once | CUTANEOUS | Status: DC

## 2016-04-14 MED ORDER — GLYCOPYRROLATE 0.2 MG/ML IJ SOLN: 0.2000 mg | Freq: Once | INTRAMUSCULAR | Status: DC | PRN

## 2016-04-14 MED ORDER — DEXAMETHASONE SODIUM PHOSPHATE 4 MG/ML IJ SOLN
INTRAMUSCULAR | Status: DC | PRN
  Administered 2016-04-14: 10 mg via INTRAVENOUS

## 2016-04-14 MED ORDER — MIDAZOLAM HCL 2 MG/2ML IJ SOLN
INTRAMUSCULAR | Status: AC
  Filled 2016-04-14: qty 2

## 2016-04-14 MED ORDER — PROPOFOL 10 MG/ML IV BOLUS
INTRAVENOUS | Status: DC | PRN
  Administered 2016-04-14: 200 mg via INTRAVENOUS

## 2016-04-14 MED ORDER — LACTATED RINGERS IV SOLN
INTRAVENOUS | Status: DC
  Administered 2016-04-14 (×2): via INTRAVENOUS

## 2016-04-14 MED ORDER — HYDROCODONE-ACETAMINOPHEN 7.5-325 MG/15ML PO SOLN: 10.0000 mL | ORAL | Status: DC | PRN

## 2016-04-14 MED ORDER — HYDROMORPHONE HCL 1 MG/ML IJ SOLN
INTRAMUSCULAR | Status: AC
  Filled 2016-04-14: qty 1

## 2016-04-14 MED ORDER — LIDOCAINE HCL (CARDIAC) 20 MG/ML IV SOLN
INTRAVENOUS | Status: DC | PRN
  Administered 2016-04-14: 100 mg via INTRAVENOUS

## 2016-04-14 MED ORDER — ONDANSETRON HCL 4 MG/2ML IJ SOLN: 4.0000 mg | INTRAMUSCULAR | Status: DC | PRN

## 2016-04-14 MED ORDER — ONDANSETRON HCL 4 MG/2ML IJ SOLN
INTRAMUSCULAR | Status: DC | PRN
  Administered 2016-04-14: 4 mg via INTRAVENOUS

## 2016-04-14 MED ORDER — LIDOCAINE 2% (20 MG/ML) 5 ML SYRINGE
INTRAMUSCULAR | Status: AC
  Filled 2016-04-14: qty 5

## 2016-04-14 MED ORDER — BACITRACIN-NEOMYCIN-POLYMYXIN 400-5-5000 EX OINT
TOPICAL_OINTMENT | CUTANEOUS | Status: AC
  Filled 2016-04-14: qty 1

## 2016-04-14 MED ORDER — LIDOCAINE HCL (PF) 1 % IJ SOLN
INTRAMUSCULAR | Status: AC
  Filled 2016-04-14: qty 30

## 2016-04-14 MED ORDER — MORPHINE SULFATE (PF) 2 MG/ML IV SOLN
2.0000 mg | INTRAVENOUS | Status: DC | PRN
  Administered 2016-04-14: 2 mg via INTRAVENOUS
  Filled 2016-04-14: qty 2

## 2016-04-14 MED ORDER — DEXTROSE IN LACTATED RINGERS 5 % IV SOLN
INTRAVENOUS | Status: DC
  Administered 2016-04-14: 75 mL/h via INTRAVENOUS

## 2016-04-14 MED ORDER — SUCCINYLCHOLINE CHLORIDE 20 MG/ML IJ SOLN
INTRAMUSCULAR | Status: DC | PRN
  Administered 2016-04-14: 100 mg via INTRAVENOUS

## 2016-04-14 MED ORDER — FENTANYL CITRATE (PF) 100 MCG/2ML IJ SOLN
50.0000 ug | INTRAMUSCULAR | Status: DC | PRN
  Administered 2016-04-14 (×2): 50 ug via INTRAVENOUS

## 2016-04-14 MED ORDER — FENTANYL CITRATE (PF) 100 MCG/2ML IJ SOLN
INTRAMUSCULAR | Status: AC
  Filled 2016-04-14: qty 2

## 2016-04-14 MED ORDER — MIDAZOLAM HCL 2 MG/2ML IJ SOLN
1.0000 mg | INTRAMUSCULAR | Status: DC | PRN
  Administered 2016-04-14: 2 mg via INTRAVENOUS

## 2016-04-14 MED FILL — AMOX TR-K CLV 250-62.5/5 SU: 250-62.5 | 10 days supply | Qty: 200 | Fill #0

## 2016-04-14 MED FILL — HYDROCOD-APAP 7.5-325/15ML: 7.5-325 | 5 days supply | Qty: 473 | Fill #0

## 2016-04-14 SURGICAL SUPPLY — 41 items
CANISTER SUCT 1200ML W/VALVE (MISCELLANEOUS) ×3 IMPLANT
CATH ROBINSON RED A/P 10FR (CATHETERS) ×3 IMPLANT
CLEANER CAUTERY TIP 5X5 PAD (MISCELLANEOUS) ×1 IMPLANT
COAGULATOR SUCT 6 FR SWTCH (ELECTROSURGICAL) ×1
COAGULATOR SUCT SWTCH 10FR 6 (ELECTROSURGICAL) ×2 IMPLANT
COVER MAYO STAND STRL (DRAPES) ×3 IMPLANT
ELECT COATED BLADE 2.86 ST (ELECTRODE) ×3 IMPLANT
ELECT REM PT RETURN 9FT ADLT (ELECTROSURGICAL)
ELECT REM PT RETURN 9FT PED (ELECTROSURGICAL)
ELECTRODE REM PT RETRN 9FT PED (ELECTROSURGICAL) IMPLANT
ELECTRODE REM PT RTRN 9FT ADLT (ELECTROSURGICAL) IMPLANT
GLOVE BIOGEL M 7.0 STRL (GLOVE) ×3 IMPLANT
GLOVE BIOGEL PI IND STRL 7.0 (GLOVE) ×1 IMPLANT
GLOVE BIOGEL PI IND STRL 8.5 (GLOVE) ×1 IMPLANT
GLOVE BIOGEL PI INDICATOR 7.0 (GLOVE) ×2
GLOVE BIOGEL PI INDICATOR 8.5 (GLOVE) ×2
GLOVE ECLIPSE 6.5 STRL STRAW (GLOVE) ×3 IMPLANT
GOWN STRL REUS W/ TWL LRG LVL3 (GOWN DISPOSABLE) ×2 IMPLANT
GOWN STRL REUS W/ TWL XL LVL3 (GOWN DISPOSABLE) IMPLANT
GOWN STRL REUS W/TWL LRG LVL3 (GOWN DISPOSABLE) ×4
GOWN STRL REUS W/TWL XL LVL3 (GOWN DISPOSABLE)
MARKER SKIN DUAL TIP RULER LAB (MISCELLANEOUS) ×3 IMPLANT
NEEDLE PRECISIONGLIDE 27X1.5 (NEEDLE) IMPLANT
NS IRRIG 1000ML POUR BTL (IV SOLUTION) ×3 IMPLANT
PAD CLEANER CAUTERY TIP 5X5 (MISCELLANEOUS) ×2
PENCIL BUTTON HOLSTER BLD 10FT (ELECTRODE) ×3 IMPLANT
PIN SAFETY STERILE (MISCELLANEOUS) IMPLANT
SHEET MEDIUM DRAPE 40X70 STRL (DRAPES) ×3 IMPLANT
SOLUTION BUTLER CLEAR DIP (MISCELLANEOUS) ×3 IMPLANT
SPONGE GAUZE 4X4 12PLY STER LF (GAUZE/BANDAGES/DRESSINGS) ×3 IMPLANT
SPONGE NEURO XRAY DETECT 1X3 (DISPOSABLE) IMPLANT
SPONGE TONSIL 1 RF SGL (DISPOSABLE) IMPLANT
SPONGE TONSIL 1.25 RF SGL STRG (GAUZE/BANDAGES/DRESSINGS) ×3 IMPLANT
SYR BULB 3OZ (MISCELLANEOUS) ×3 IMPLANT
SYR CONTROL 10ML LL (SYRINGE) IMPLANT
TOWEL OR 17X24 6PK STRL BLUE (TOWEL DISPOSABLE) ×3 IMPLANT
TUBE CONNECTING 20'X1/4 (TUBING) ×1
TUBE CONNECTING 20X1/4 (TUBING) ×2 IMPLANT
TUBE SALEM SUMP 12R W/ARV (TUBING) IMPLANT
TUBE SALEM SUMP 16 FR W/ARV (TUBING) ×3 IMPLANT
YANKAUER SUCT BULB TIP NO VENT (SUCTIONS) ×3 IMPLANT

## 2016-04-14 NOTE — Brief Op Note (Signed)
04/14/2016  10:35 AM  PATIENT:  [name]  39 y.o. male  PRE-OPERATIVE DIAGNOSIS:  NECK MASS LEFT LATERAL SWELLING   POST-OPERATIVE DIAGNOSIS:  NECK MASS LEFT LATERAL SWELLING  PROCEDURE:  Procedure(s): TONSILLECTOMY AND BIOPSY OF TONGUE  (Left) BASE NASAL NASOPHARYNX (Left)  SURGEON:  Surgeon(s) and Role:    * Jerrell Belfast, MD - Primary  PHYSICIAN ASSISTANT:   ASSISTANTS: none   ANESTHESIA:   general  EBL:  Total I/O In: 1000 [I.V.:1000] Out: 50 [Blood:50]  BLOOD ADMINISTERED:none  DRAINS: none   LOCAL MEDICATIONS USED:  NONE  SPECIMEN:  Source of Specimen:  See op report  DISPOSITION OF SPECIMEN:  PATHOLOGY  COUNTS:  YES  TOURNIQUET:  * No tourniquets in log *  DICTATION: .Other Dictation: Dictation Number [id number]  PLAN OF CARE: Admit for overnight observation  PATIENT DISPOSITION:  PACU - hemodynamically stable.   Delay start of Pharmacological VTE agent (>24hrs) due to surgical blood loss or risk of bleeding: not applicable

## 2016-04-14 NOTE — Transfer of Care (Signed)
Immediate Anesthesia Transfer of Care Note  Patient: [name]  Procedure(s) Performed: Procedure(s): TONSILLECTOMY AND BIOPSY OF TONGUE  (Left) BASE NASAL NASOPHARYNX (Left)  Patient Location: PACU  Anesthesia Type:General  Level of Consciousness: awake, alert  and patient cooperative  Airway & Oxygen Therapy: Patient Spontanous Breathing and Patient connected to face mask oxygen  Post-op Assessment: Report given to RN, Post -op Vital signs reviewed and stable and Patient moving all extremities  Post vital signs: Reviewed and stable  Last Vitals:  Filed Vitals:   04/14/16 0916  BP: 132/71  Pulse: 73  Temp: 36.7 C  Resp: 18    Last Pain:  Filed Vitals:   04/14/16 0917  PainSc: 5       Patients Stated Pain Goal: 2 ([id number])  Complications: No apparent anesthesia complications

## 2016-04-14 NOTE — Anesthesia Preprocedure Evaluation (Signed)
Anesthesia Evaluation  Patient identified by MRN, date of birth, ID band Patient awake    Reviewed: Allergy & Precautions, NPO status , Patient's Chart, lab work & pertinent test results  Airway Mallampati: II  TM Distance: >3 FB Neck ROM: Full    Dental no notable dental hx.    Pulmonary neg pulmonary ROS,    Pulmonary exam normal breath sounds clear to auscultation       Cardiovascular negative cardio ROS Normal cardiovascular exam Rhythm:Regular Rate:Normal     Neuro/Psych negative neurological ROS  negative psych ROS   GI/Hepatic Neg liver ROS, GERD  ,  Endo/Other  negative endocrine ROS  Renal/GU negative Renal ROS  negative genitourinary   Musculoskeletal negative musculoskeletal ROS (+)   Abdominal   Peds negative pediatric ROS (+)  Hematology negative hematology ROS (+)   Anesthesia Other Findings   Reproductive/Obstetrics negative OB ROS                             Anesthesia Physical Anesthesia Plan  ASA: II  Anesthesia Plan: General   Post-op Pain Management:    Induction: Intravenous  Airway Management Planned: Oral ETT  Additional Equipment:   Intra-op Plan:   Post-operative Plan: Extubation in OR  Informed Consent: I have reviewed the patients History and Physical, chart, labs and discussed the procedure including the risks, benefits and alternatives for the proposed anesthesia with the patient or authorized representative who has indicated his/her understanding and acceptance.   Dental advisory given  Plan Discussed with: CRNA and Surgeon  Anesthesia Plan Comments:         Anesthesia Quick Evaluation

## 2016-04-14 NOTE — Anesthesia Postprocedure Evaluation (Signed)
Anesthesia Post Note  Patient: [name]  Procedure(s) Performed: Procedure(s) (LRB): TONSILLECTOMY AND BIOPSY OF TONGUE  (Left) BASE NASAL NASOPHARYNX (Left)  Patient location during evaluation: PACU Anesthesia Type: General Level of consciousness: awake and alert Pain management: pain level controlled Vital Signs Assessment: post-procedure vital signs reviewed and stable Respiratory status: spontaneous breathing, nonlabored ventilation, respiratory function stable and patient connected to nasal cannula oxygen Cardiovascular status: blood pressure returned to baseline and stable Postop Assessment: no signs of nausea or vomiting Anesthetic complications: no    Last Vitals:  Filed Vitals:   04/14/16 1037 04/14/16 1045  BP:  131/89  Pulse: 89 82  Temp: 36.4 C   Resp: 14 16    Last Pain:  Filed Vitals:   04/14/16 1051  PainSc: 5                  Edelin Fryer S

## 2016-04-14 NOTE — OR Nursing (Deleted)
Hycet from pyxis as 10 ml,  15 ml given to patient.  None to be wasted.  Marta Antu, RN witnessed.

## 2016-04-14 NOTE — Discharge Instructions (Signed)

## 2016-04-14 NOTE — H&P (Signed)
[name] is an 40 y.o. male.   Chief Complaint: Left neck mass HPI: Pt with bx pos. SCCa of left neck.  Past Medical History  Diagnosis Date  . Mitral valve prolapse   . GERD (gastroesophageal reflux disease)   . Weight loss     Past Surgical History  Procedure Laterality Date  . Elbow surgery Right   . Knee surgery Left     History reviewed. No pertinent family history. Social History:  reports that he has never smoked. He does not have any smokeless tobacco history on file. He reports that he drinks alcohol. He reports that he does not use illicit drugs.  Allergies: No Known Allergies  Medications Prior to Admission  Medication Sig Dispense Refill  . aspirin-acetaminophen-caffeine (EXCEDRIN MIGRAINE) 250-250-65 MG tablet Take 2 tablets by mouth every 6 (six) hours as needed for headache.      No results found for this or any previous visit (from the past 48 hour(s)). No results found.  Review of Systems  Constitutional: Negative.   HENT:       Left neck mass  Respiratory: Negative.   Cardiovascular: Negative.   Gastrointestinal: Negative.     Height 5\' 11"  (1.803 m), weight 79.379 kg (175 lb). Physical Exam  Constitutional: He is oriented to person, place, and time. He appears well-developed and well-nourished.  Cardiovascular: Normal rate.   Respiratory: Effort normal.  GI: Soft.  Musculoskeletal: Normal range of motion.  Lymphadenopathy:    He has cervical adenopathy.  Neurological: He is alert and oriented to person, place, and time.     Assessment/Plan Adm for OP L tonsillectomy, tongue base bx and NP bx  Yalda Herd, MD 04/14/2016, 8:50 AM

## 2016-04-14 NOTE — Anesthesia Procedure Notes (Signed)
Procedure Name: Intubation Date/Time: 04/14/2016 10:02 AM Performed by: Baxter Flattery Pre-anesthesia Checklist: Patient identified, Emergency Drugs available, Suction available and Patient being monitored Patient Re-evaluated:Patient Re-evaluated prior to inductionOxygen Delivery Method: Circle system utilized Preoxygenation: Pre-oxygenation with 100% oxygen Intubation Type: IV induction Ventilation: Mask ventilation without difficulty Laryngoscope Size: Miller and 2 Grade View: Grade II Tube type: Oral Tube size: 8.0 mm Number of attempts: 1 Airway Equipment and Method: Stylet and Oral airway Placement Confirmation: ETT inserted through vocal cords under direct vision,  positive ETCO2 and breath sounds checked- equal and bilateral Secured at: 22 cm Tube secured with: Tape Dental Injury: Teeth and Oropharynx as per pre-operative assessment

## 2016-04-17 ENCOUNTER — Encounter (HOSPITAL_BASED_OUTPATIENT_CLINIC_OR_DEPARTMENT_OTHER): Payer: Self-pay | Admitting: Otolaryngology

## 2016-04-17 NOTE — Progress Notes (Signed)
Head and Neck Cancer Location of Tumor / Histology:  Left Neck fine needle aspiration favored squamous cell carcinoma.   Patient presented with symptoms of: When he presented to the Emergency Department on 03/12/16 he reported a several week history of Left neck swelling.   Biopsies of revealed:  03/28/16 Left neck mass, Fine Needle Aspiration I (smears): Positive for malignancy, favor squamous cell carcinoma  04/14/16  Diagnosis 1. Nasopharynx, biopsy, Right Superior - DENSELY INFLAMED RESPIRATORY EPITHELIUM. - THERE IS NO EVIDENCE OF MALIGNANCY. 2. Nasopharynx, biopsy, Left Superior - DENSELY INFLAMED RESPIRATORY EPITHELIUM. - THERE IS NO EVIDENCE OF MALIGNANCY. 3. Nasopharynx, biopsy, Right Inferior - DENSELY INFLAMED RESPIRATORY EPITHELIUM. - THERE IS NO EVIDENCE OF MALIGNANCY. 4. Nasopharynx, biopsy, Left Inferior - DENSELY INFLAMED RESPIRATORY EPITHELIUM. - THERE IS NO EVIDENCE OF MALIGNANCY. 5. Tongue, biopsy, Lateral Base - DENSELY INFLAMED SQUAMOUS LINED MUCOSA. - THERE IS NO EVIDENCE OF MALIGNANCY. 6. Tongue, biopsy, Medial Base - DENSELY INFLAMED SQUAMOUS LINED MUCOSA. - THERE IS NO EVIDENCE OF MALIGNANCY. 7. Tonsil, Left - INVASIVE SQUAMOUS CELL CARCINOMA. - SEE COMMENT. 8. Oropharynx, biopsy, Left - DENSELY INFLAMED RESPIRATORY EPITHELIUM. - THERE IS NO EVIDENCE OF MALIGNANCY.   Nutrition Status Yes No Comments  Weight changes? [x]  []  He reports a 14 lb weight loss since April  Swallowing concerns? [x]  []  He did have trouble before his surgery, related to feeling like his food was stuck. Since the surgery he has had difficulty swallowing related to pain and food feeling like it is getting stuck.   PEG? []  [x]     Referrals Yes No Comments  Social Work? []  [x]    Dentistry? [x]  []  Dr. Enrique Sack 04/24/16  Swallowing therapy? []  [x]    Nutrition? []  [x]    Med/Onc? []  [x]  No Chemotherapy planned?   Safety Issues Yes No Comments  Prior radiation? []  [x]     Pacemaker/ICD? []  [x]    Possible current pregnancy? []  [x]  N/A  Is the patient on methotrexate? []  [x]     Tobacco/Marijuana/Snuff/ETOH use: He has never smoked.   Past/Anticipated interventions by otolaryngology, if any:  04/14/16 PROCEDURE: Procedure(s): TONSILLECTOMY AND BIOPSY OF TONGUE (Left) BASE NASAL NASOPHARYNX (Left) SURGEON: Surgeon(s) and Role:  * Jerrell Belfast, MD - Primary  Past/Anticipated interventions by medical oncology, if any: None planned.      Current Complaints / other details:   04/07/16 PET scan IMPRESSION: 1. Solitary left level-II lymph node is enlarged and exhibits malignant range FDG uptake. 2. Symmetric uptake identified within both tonsillar regions. No definite findings identified to suggest the primary site of disease. 3. No evidence for metastatic disease to the chest, abdomen, pelvis or bony structures. 4. Gallstones.  He does report when he leans his head back, he feels like his throat is blocked.  BP 129/88 mmHg  Pulse 99  Temp(Src) 97.8 F (36.6 C)  Ht 5\' 11"  (1.803 m)  Wt 174 lb 1.6 oz (78.971 kg)  BMI 24.29 kg/m2  SpO2 99%   Wt Readings from Last 3 Encounters:  04/21/16 174 lb 1.6 oz (78.971 kg)  04/14/16 176 lb (79.833 kg)

## 2016-04-19 NOTE — Op Note (Signed)
NAME:  MINA, METCALF NO.:  [id number]  MEDICAL RECORD NO.:  [id number]  LOCATION:                                 FACILITY:  PHYSICIAN:  Early Chars. Wilburn Cornelia, M.D.DATE OF BIRTH:  29-Dec-1975  DATE OF PROCEDURE:  04/14/2016 DATE OF DISCHARGE:                              OPERATIVE REPORT   LOCATION:  [place].  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  SURGICAL PROCEDURE: 1. Left tonsillectomy. 2. Nasopharyngeal biopsies. 3. Base of tongue biopsy.  ANESTHESIA:  General endotracheal.  SURGEON:  Early Chars. Wilburn Cornelia, MD.  COMPLICATIONS:  None.  ESTIMATED BLOOD LOSS:  Less than 50 mL.  The patient transferred from the operating room to the recovery room in stable condition.  BRIEF HISTORY:  The patient is an otherwise healthy 40 year old male who presented to our office with a left lateral neck mass.  Workup including CT scan and needle biopsy showed findings consistent with metastatic squamous cell carcinoma of the left neck.  The patient underwent PET scan to identify a possible primary.  There is no evidence of abnormal enhancement or activity with the exception of the left neck lymph node. A previous examination in the office showed no evidence of mucosal lesion or mass and the patient was completely asymptomatic.  Given his history and findings, I recommended that we undertake left tonsillectomy and the above-noted biopsies in order to establish the primary source of his metastatic squamous cell carcinoma.  The risks and benefits of procedure were discussed in detail with the patient and his family and they understood and agreed with our plan for surgery which is scheduled on an  elective basis at [facility].  DESCRIPTION OF PROCEDURE:  The patient was brought to the operating room on April 14, 2016, and placed in supine position on the operating table. General endotracheal anesthesia was established without difficulty. When the patient was adequately anesthetized, a surgical time-out was performed with correct identification of the patient, and the surgical procedures as well as the laterality of the left-sided biopsies.  With the patient prepped, draped and prepared, a Crowe-Davis mouth gag was inserted without difficulty.  The oral cavity and oropharynx were examined, nasopharynx was examined.  There is no evidence of mucosal mass or lesion.  Surgical procedure was begun with left tonsillectomy using Bovie electrocautery and dissecting in a subcapsular fashion.  The entire left tonsil was removed from superior pole to tongue base.  The tonsillar fossa were then gently abraded with a dry tonsil sponge and several areas of point hemorrhage were then cauterized with suction cautery.  Biopsy was then undertaken of the nasopharynx.  Under direct visualization four quadrant nasopharyngeal biopsy was undertaken.  These were blind biopsies and involved the right and left superior nasopharynx and right and left inferior nasopharynx.  Next set of biopsies performed involved the tongue base.  With the mouth guard in place, I was able to visualize  the patient's tongue base and lingual tonsil tissue was then biopsied using cup forceps.  Biopsy areas included medial and lateral left tongue base.  There is also an area of swelling and inflammatory changes along the posterior oropharynx on the left-hand side which was biopsied again as a separate specimen.  Each of the above specimen were carefully marked for location and laterality and these were all sent to Pathology for gross microscopic evaluation in formalin.  After the conclusion of all the  biopsies, the patient's nasal cavity and nasopharynx were irrigated and suctioned.  Orogastric tube was passed and the stomach contents were aspirated.  The mouth gag was released and reapplied.  There is no bleeding.  The mouth gag was then removed. Again no loose or broken teeth and no active bleeding.  The patient was awakened from his anesthetic.  He was then extubated and transferred from the operating room to recovery room in stable condition.  There were no complications and blood loss was approximately 50 mL.    ______________________________ Early Chars. Wilburn Cornelia, M.D.   ______________________________ Early Chars. Wilburn Cornelia, M.D.    DLS/MEDQ  D:  [id number]  T:  04/15/2016  Job:  [id number]

## 2016-04-20 ENCOUNTER — Telehealth: Payer: Self-pay | Admitting: *Deleted

## 2016-04-20 NOTE — Telephone Encounter (Signed)
  Oncology Nurse Navigator Documentation  Navigator Location: [facility] On[facility] (04/20/16 1755) Navigator Encounter Type: Telephone (04/20/16 1755) Telephone: [facility] Call;Appt Confirmation/Clarification (04/20/16 1755)             Barriers/Navigation Needs: Education (04/20/16 1755) Education: Newly Diagnosed Cancer Education (04/20/16 1755)       Returned Mr. Ungerer's VM, confirmed appt times for tomorrow and with Dr. Enrique Sack on Monday of next week, answered his questions about treatment start date, potential need for feeding tube. He understands to arrive tomorrow by 1:15 to register.  [name], RN, BSN, [place] [facility] [place] [phone]                      Time Spent with Patient: 15 (04/20/16 1755)

## 2016-04-21 ENCOUNTER — Ambulatory Visit: Payer: Self-pay | Admitting: Hematology and Oncology

## 2016-04-21 ENCOUNTER — Ambulatory Visit
Admission: RE | Admit: 2016-04-21 | Discharge: 2016-04-21 | Disposition: A | Discharge status: Home / Self Care | Payer: Managed Care, Other (non HMO) | Source: Ambulatory Visit | Attending: Radiation Oncology | Admitting: Radiation Oncology

## 2016-04-21 ENCOUNTER — Encounter: Payer: Self-pay | Admitting: Radiation Oncology

## 2016-04-21 ENCOUNTER — Encounter: Payer: Self-pay | Admitting: *Deleted

## 2016-04-21 VITALS — BP 129/88 | HR 99 | Temp 97.8°F | Ht 71.0 in | Wt 174.1 lb

## 2016-04-21 DIAGNOSIS — I341 Nonrheumatic mitral (valve) prolapse: Secondary | ICD-10-CM | POA: Insufficient documentation

## 2016-04-21 DIAGNOSIS — Z510 Encounter for antineoplastic radiation therapy: Secondary | ICD-10-CM | POA: Insufficient documentation

## 2016-04-21 DIAGNOSIS — R1310 Dysphagia, unspecified: Secondary | ICD-10-CM | POA: Diagnosis not present

## 2016-04-21 DIAGNOSIS — Z7401 Bed confinement status: Secondary | ICD-10-CM | POA: Diagnosis not present

## 2016-04-21 DIAGNOSIS — R634 Abnormal weight loss: Secondary | ICD-10-CM | POA: Diagnosis present

## 2016-04-21 DIAGNOSIS — K219 Gastro-esophageal reflux disease without esophagitis: Secondary | ICD-10-CM | POA: Insufficient documentation

## 2016-04-21 DIAGNOSIS — C090 Malignant neoplasm of tonsillar fossa: Secondary | ICD-10-CM | POA: Diagnosis present

## 2016-04-21 DIAGNOSIS — R590 Localized enlarged lymph nodes: Secondary | ICD-10-CM | POA: Insufficient documentation

## 2016-04-21 DIAGNOSIS — K429 Umbilical hernia without obstruction or gangrene: Secondary | ICD-10-CM | POA: Diagnosis not present

## 2016-04-21 NOTE — Progress Notes (Signed)
  Oncology Nurse Navigator Documentation  Navigator Location: [facility] (04/21/16 1415) Navigator Encounter Type: Initial RadOnc (04/21/16 1415)           Patient Visit Type: KVTXLE (04/21/16 1415) Treatment Phase: Pre-Tx/Tx Discussion (04/21/16 1415) Barriers/Navigation Needs: No barriers at this time;Coordination of Care;Education (04/21/16 1415) Education: Understanding Cancer/ Treatment Options (04/21/16 1415) Interventions: Education Method (04/21/16 1415)     Education Method: Verbal;Tour (04/21/16 1415)        Met with [name] during initial consult with Dr. Isidore Moos.  He was accompanied by his wife. 1. Further introduced myself as his Navigator, explained my role as a member of the Care Team.   2. Provided New Patient Information packet, discussed contents:  Contact information for physician(s), myself, other members of the Care Team.  Advance Directive information ([place] blue pamphlet with LCSW contact info) - He stated they are meeting with a lawyer to complete.  Fall Prevention Patient Safety Plan  Appointment Guideline  Financial Assistance Information sheet  [place] with highlight of [place] 3. Provided introductory explanation of radiation treatment including SIM planning and purpose of Aquaplast head and shoulder mask, showed them example.   4. Provided and discussed education handouts for PEG.  5. We discussed his attendance at next week Tuesday's [facility].  I provided him an 8:45 arrival time. 6. Provided a tour of SIM and [place] areas, explained treatment and arrival procedures. 7. Showed them location of [place], explained registration process for his Monday appt. 8. I encouraged them to contact me with questions/concerns as treatments/procedures begin.  They verbalized understanding of information provided.    [name], RN, BSN, [place] at [place] [phone]            Time Spent with Patient: 120 (04/21/16 1415)

## 2016-04-21 NOTE — Progress Notes (Signed)
Radiation Oncology         [phone] ________________________________  Initial outpatient Consultation  Name: [name] MRN: [id number]  Date: 04/21/2016  DOB: 1976-04-27  [id number] [name] Dust, MD  No ref. 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 Loss of weight 783.21 R63.4 naproxen (NAPROSYN) 500 MG tablet     TSH  2. Cancer of tonsillar fossa (HCC) 146.1 C09.0 naproxen (NAPROSYN) 500 MG tablet     TSH    HISTORY OF PRESENT ILLNESS::[name] is a 40 y.o. male who presented with left neck swelling which prompted him to visit the ED at the end of May.  Subsequently, the patient saw Dr. Claiborne Rigg who conducted a biopsy of the left neck mass.  Biopsy of the left neck mass on 03/27/16 revealed: malignant cells favoring squamous cell carcinoma.   Pertinent imaging thus far includes CT of neck performed on 03/03/16 revealing left cervical adenopathy. Also a PET scan on 04/07/26 revealed a solitary left level 2 lymph node that was enlarged any hypermetabolic. There is symmetric tonsillar hypermetabolic activity.   On 04/14/16 Dr. Wilburn Cornelia performed biopsies of bilateral nasopharynx as well as the lateral and medial base of tongue.  These biopsies were negative for malignancy, however, biopsy of the left tonsil revealed invasive squamous cell carcinoma which was diffusely p16 positive. The carcinoma was moderately differentiated. Dr Victorio Palm op note noted an area of swelling and inflammatory changes along the posterior oropharynx on theleft-hand side.   The patient reports 14lbs weight loss since April. He has had some sensation of food feeling like it is getting stuck in his throat with swallowing. He sees Dr. Enrique Sack on 04/24/16. He denies any history of smoking/chewing tobacco. Reports when he leans his head back, he feels as though  his head is blocked. He is here today with his wife, who is a Education officer, museum.    Prior cancers, if any: None  Patient reports that he drinks a glass of wine per day. Patient reports that he has been having some trouble swallowing, and has had waxing and waning throat pain over the past few years, in addition to waxing and waning swollen throat lymph nodes. Patient reports headaches, ear aches, and a tingling sensation on the left side of his face. Patient reports that his swelling has worsened since his surgical procedure on 04/14/16. He reports that he can, for the most part, swallow liquids with minimal effort versus solid food. Patient reports that he has nausea and a burning sensation in his stomach when he takes medication without eating food.  PREVIOUS RADIATION THERAPY: No  PAST MEDICAL HISTORY:  has a past medical history of Mitral valve prolapse; GERD (gastroesophageal reflux disease); and Weight loss.    PAST SURGICAL HISTORY: Past Surgical History  Procedure Laterality Date  . Elbow surgery Right   . Knee surgery Left   . Tonsillectomy Left 04/14/2016    Procedure: TONSILLECTOMY AND BIOPSY OF TONGUE ;  Surgeon: Jerrell Belfast, MD;  Location: [facility];  Service: ENT;  Laterality: Left;  . Nasopharyngoscopy Left 04/14/2016    Procedure: BASE NASAL NASOPHARYNX;  Surgeon: Jerrell Belfast, MD;  Location: [place];  Service: ENT;  Laterality: Left;    FAMILY HISTORY: family history is not on file.  SOCIAL HISTORY:  reports that he has never smoked. He does  not have any smokeless tobacco history on file. He reports that he drinks alcohol. He reports that he does not use illicit drugs.  ALLERGIES: Review of patient's allergies indicates no known allergies.  MEDICATIONS:  Current Outpatient Prescriptions  Medication Sig Dispense Refill  . amoxicillin-clavulanate (AUGMENTIN) 250-62.5 MG/5ML suspension Take 10 mLs by mouth 2 (two) times daily. 200 mL 0    . HYDROcodone-acetaminophen (HYCET) 7.5-325 mg/15 ml solution Take 10-15 mLs by mouth every 4 (four) hours as needed for moderate pain. 473 mL 0  . naproxen (NAPROSYN) 500 MG tablet Take by mouth. Reported on 04/21/2016     No current facility-administered medications for this encounter.    REVIEW OF SYSTEMS:  Notable for that above.   PHYSICAL EXAM:  height is 5\' 11"  (1.803 m) and weight is 174 lb 1.6 oz (78.971 kg). His temperature is 97.8 F (36.6 C). His blood pressure is 129/88 and his pulse is 99. His oxygen saturation is 99%.   General: Alert and oriented, in no acute distress HEENT: Head is normocephalic. Extraocular movements are intact. Oropharynx is notable for whiteish granulation tissue extending from the majority of the left tonsillar fossa to the left soft palate, the region of abnormality expands approximately 3-4cm. The right tonsil appears slightly full. On the left, the soft palate whitish tissue extends to the lateral aspect of the uvula appearing related to recent biopsy. He has a left level 2 mass that's approximately 3.0 cm at greatest dimension, no other masses appreciated from neck.   Tympanic membranes are clear.   Neck: Neck is notable for neck pain  Heart: Regular in rate and rhythm with a heart murmur that is most audible at the mitral valve region.  (pt states history of mitral valve prolapse) Chest: Clear to auscultation bilaterally, with no rhonchi, wheezes, or rales. Abdomen: Soft, nontender, nondistended, with no rigidity or guarding. Extremities: No cyanosis or edema. Lymphatics: see Neck Exam Skin: No concerning lesions. Musculoskeletal: symmetric strength and muscle tone throughout. Neurologic: Cranial nerves II through XII are grossly intact. No obvious focalities. Speech is fluent. Coordination is intact. Psychiatric: Judgment and insight are intact. Affect is appropriate.  ECOG = 0  0 - Asymptomatic (Fully active, able to carry on all predisease  activities without restriction)  1 - Symptomatic but completely ambulatory (Restricted in physically strenuous activity but ambulatory and able to carry out work of a light or sedentary nature. For example, light housework, office work)  2 - Symptomatic, <50% in bed during the day (Ambulatory and capable of all self care but unable to carry out any work activities. Up and about more than 50% of waking hours)  3 - Symptomatic, >50% in bed, but not bedbound (Capable of only limited self-care, confined to bed or chair 50% or more of waking hours)  4 - Bedbound (Completely disabled. Cannot carry on any self-care. Totally confined to bed or chair)  5 - Death   Eustace Pen MM, Creech RH, Tormey DC, et al. [phone]). "Toxicity and response criteria of the [facility]". [place] Oncol. 5 (6): 649-55   LABORATORY DATA:  Lab Results  Component Value Date   WBC 7.4 03/12/2016   HGB 14.4 03/12/2016   HCT 41.6 03/12/2016   MCV 86.1 03/12/2016   PLT 227 03/12/2016   CMP     Component Value Date/Time   NA 138 03/12/2016 0345   NA 140 10/22/2012 0146   K 3.4* 03/12/2016 0345   K 3.9 10/22/2012  0146   CL 104 03/12/2016 0345   CL 103 10/22/2012 0146   CO2 28 03/12/2016 0345   CO2 27 10/22/2012 0146   GLUCOSE 118* 03/12/2016 0345   GLUCOSE 113* 10/22/2012 0146   BUN 20 03/12/2016 0345   BUN 16 10/22/2012 0146   CREATININE 0.98 03/12/2016 0345   CREATININE 1.04 10/22/2012 0146   CALCIUM 9.4 03/12/2016 0345   CALCIUM 8.8 10/22/2012 0146   GFRNONAA >60 03/12/2016 0345   GFRNONAA >60 10/22/2012 0146   GFRAA >60 03/12/2016 0345   GFRAA >60 10/22/2012 0146      No results found for: TSH    RADIOGRAPHY: Nm Pet Image Initial (pi) Skull Base To Thigh  04/07/2016  CLINICAL DATA:  Initial treatment strategy for head and neck carcinoma. EXAM: NUCLEAR MEDICINE PET SKULL BASE TO THIGH TECHNIQUE: 8.8 mCi F-18 FDG was injected intravenously. Full-ring PET imaging was performed from  the skull base to thigh after the radiotracer. CT data was obtained and used for attenuation correction and anatomic localization. FASTING BLOOD GLUCOSE:  Value: 8.8 mg/dl COMPARISON:  03/12/2016 FINDINGS: NECK There is a hypermetabolic left level-II measuring 2.7 x 2.0 cm. SUV max is equal to 23.74. Bilateral and symmetric increased uptake identified within both tonsillar regions. No additional hypermetabolic or enlarged lymph nodes within the soft tissues of the neck. CHEST No hypermetabolic mediastinal or hilar nodes. No suspicious pulmonary nodules on the CT scan. ABDOMEN/PELVIS No abnormal hypermetabolic activity within the liver, pancreas, adrenal glands, or spleen. Numerous stones are identified within the gallbladder. No hypermetabolic lymph nodes in the abdomen or pelvis. Small periumbilical hernia contains fat only. SKELETON No focal hypermetabolic activity to suggest skeletal metastasis. IMPRESSION: 1. Solitary left level-II lymph node is enlarged and exhibits malignant range FDG uptake. 2. Symmetric uptake identified within both tonsillar regions. No definite findings identified to suggest the primary site of disease. 3. No evidence for metastatic disease to the chest, abdomen, pelvis or bony structures. 4. Gallstones. Electronically Signed   By: Kerby Moors M.D.   On: 04/07/2016 09:15      IMPRESSION/PLAN:  This is a delightful patient with tonsil head and neck cancer. I have recommended radiotherapy for this patient.  I spoke with the patient about offering a referral to our medical oncologist at the cancer center, although the case has already been discussed at tumor board, and the benefits of chemotherapy are felt to be outweighed by the risks. He declines an opportunity to meet with our medical oncologist to discuss further. I also spoke with the patient about TORS surgery which could potentially cure his cancer, although I cannot guarantee that he would not need adjuvant treatment. One  reason he might consider a surgical consultation is that radiotherapy has a significant risk of dysgeusia and the patient works as a Ambulance person. I explained to him that sometimes taste changes can persist long term, even for life. Nevertheless the patient is intent on proceeding with definitive radiation and does not want to receive a surgical opinion at [facility] on TORS.   I recommend that he receive 35 treatments over 6-7 weeks.  We discussed the potential risks, benefits, and side effects of radiotherapy. We talked in detail about acute and late effects. We discussed that some of the most bothersome acute effects may be mucositis, dysgeusia, salivary changes, skin irritation, hair loss, dehydration, weight loss and fatigue. We talked about late effects which include but are not necessarily limited to dysphagia, hypothyroidism, nerve injury, spinal cord injury,  xerostomia, trismus, and neck edema. No guarantees of treatment were given. A consent form was signed and placed in the patient's medical record. The patient is enthusiastic about proceeding with treatment. I look forward to participating in the patient's care.    Simulation (treatment planning) will take place tentatively on July 14 - we can move this is needed per dentistry.  We also discussed that the treatment of head and neck cancer is a multidisciplinary process to maximize treatment outcomes and quality of life. For this reasons the following referrals have been or will be made:    Dentistry for dental evaluation, possible extractions in the radiation fields, and /or advice on reducing risk of cavities, osteoradionecrosis, or other oral issues.  MBSS for dysphagia.                                                            Nutritionist for nutrition support during and after treatment.  Speech language pathology for swallowing and/or speech therapy.  Social work for social support.   Physical therapy due to risk of lymphedema in neck  and deconditioning.   Baseline labs including TSH.  Feeding tube discussed - will use wait and see approach and not order now.   __________________________________________   Eppie Gibson, MD     This document serves as a record of services personally performed by Eppie Gibson , MD. It was created on his behalf by Truddie Hidden, a trained medical scribe. The creation of this record is based on the scribe's personal observations and the provider's statements to them. This document has been checked and approved by the attending provider.

## 2016-04-24 ENCOUNTER — Encounter (HOSPITAL_COMMUNITY): Payer: Self-pay | Admitting: Dentistry

## 2016-04-24 ENCOUNTER — Ambulatory Visit (HOSPITAL_COMMUNITY): Payer: Self-pay | Admitting: Dentistry

## 2016-04-24 VITALS — BP 122/68 | HR 70 | Temp 98.2°F

## 2016-04-24 DIAGNOSIS — K045 Chronic apical periodontitis: Secondary | ICD-10-CM

## 2016-04-24 DIAGNOSIS — C090 Malignant neoplasm of tonsillar fossa: Secondary | ICD-10-CM

## 2016-04-24 DIAGNOSIS — C099 Malignant neoplasm of tonsil, unspecified: Secondary | ICD-10-CM | POA: Diagnosis not present

## 2016-04-24 DIAGNOSIS — K029 Dental caries, unspecified: Secondary | ICD-10-CM

## 2016-04-24 DIAGNOSIS — Z01818 Encounter for other preprocedural examination: Secondary | ICD-10-CM | POA: Diagnosis not present

## 2016-04-24 DIAGNOSIS — K036 Deposits [accretions] on teeth: Secondary | ICD-10-CM

## 2016-04-24 DIAGNOSIS — K083 Retained dental root: Secondary | ICD-10-CM

## 2016-04-24 DIAGNOSIS — M264 Malocclusion, unspecified: Secondary | ICD-10-CM

## 2016-04-24 DIAGNOSIS — K0530 Chronic periodontitis, unspecified: Secondary | ICD-10-CM

## 2016-04-24 DIAGNOSIS — K08409 Partial loss of teeth, unspecified cause, unspecified class: Secondary | ICD-10-CM

## 2016-04-24 DIAGNOSIS — IMO0002 Reserved for concepts with insufficient information to code with codable children: Secondary | ICD-10-CM

## 2016-04-24 NOTE — Progress Notes (Signed)
DENTAL CONSULTATION  Date of Consultation:  04/24/2016 Patient Name:   [name] Date of Birth:   10-18-1975 Medical Record Number: [id number]  VITALS: BP 122/68 mmHg  Pulse 70  Temp(Src) 98.2 F (36.8 C) (Oral)  CHIEF COMPLAINT: Patient was referred by Dr. Isidore Moos for a dental consultation.  HPI: [name] is a 40 year old male recently diagnosed with squamous cell carcinoma of the left tonsil. Patient with anticipated radiation therapy. Patient now seen as part of a medically necessary pre-radiation therapy dental protocol examination.  The patient currently denies acute toothaches, swellings, or abscesses. Patient was last seen by dentist one year ago for root canal therapy on tooth #19. This was with Dr. Remonia Richter, an Endodontist. The patient has not been seen by general dentist for at least 10 years. Patient has not seen a general dentist since being in [place] since 2010. The patient indicates that he is a dental phobic. Patient relates having problems with the dentist during orthodontic therapy as a teenager. Patient had the majority of his dental treatment when he was in [place] in approximately 1994/1995.   PROBLEM LIST: Patient Active Problem List   Diagnosis Date Noted  . Cancer of tonsillar fossa ([place]) 04/21/2016    Priority: High  . Mass of left side of neck 04/14/2016  . 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 Neck mass 04/14/2016    PMH: Past Medical History  Diagnosis Date  . Mitral valve prolapse   . GERD (gastroesophageal reflux disease)   . Weight loss     PSH: Past Surgical History  Procedure Laterality Date  . Elbow surgery Right   . Knee surgery Left   . Tonsillectomy Left 04/14/2016    Procedure: TONSILLECTOMY AND BIOPSY OF TONGUE ;  Surgeon: Jerrell Belfast, MD;  Location: [place];  Service: ENT;  Laterality: Left;  . Nasopharyngoscopy Left 04/14/2016    Procedure: BASE NASAL NASOPHARYNX;  Surgeon:  Jerrell Belfast, MD;  Location: [facility];  Service: ENT;  Laterality: Left;    ALLERGIES: No Known Allergies  MEDICATIONS: Current Outpatient Prescriptions  Medication Sig Dispense Refill  . amoxicillin-clavulanate (AUGMENTIN) 250-62.5 MG/5ML suspension Take 10 mLs by mouth 2 (two) times daily. 200 mL 0  . HYDROcodone-acetaminophen (HYCET) 7.5-325 mg/15 ml solution Take 10-15 mLs by mouth every 4 (four) hours as needed for moderate pain. 473 mL 0  . naproxen (NAPROSYN) 500 MG tablet Take by mouth. Reported on 04/21/2016     No current facility-administered medications for this visit.    LABS: Lab Results  Component Value Date   WBC 7.4 03/12/2016   HGB 14.4 03/12/2016   HCT 41.6 03/12/2016   MCV 86.1 03/12/2016   PLT 227 03/12/2016      Component Value Date/Time   NA 138 03/12/2016 0345   NA 140 10/22/2012 0146   K 3.4* 03/12/2016 0345   K 3.9 10/22/2012 0146   CL 104 03/12/2016 0345   CL 103 10/22/2012 0146   CO2 28 03/12/2016 0345   CO2 27 10/22/2012 0146   GLUCOSE 118* 03/12/2016 0345   GLUCOSE 113* 10/22/2012 0146   BUN 20 03/12/2016 0345   BUN 16 10/22/2012 0146   CREATININE 0.98 03/12/2016 0345   CREATININE 1.04 10/22/2012 0146   CALCIUM 9.4 03/12/2016 0345   CALCIUM 8.8 10/22/2012 0146   GFRNONAA >60 03/12/2016 0345   GFRNONAA >60 10/22/2012 0146   GFRAA >60 03/12/2016 0345   GFRAA >60 10/22/2012 0146  No results found for: INR, PROTIME No results found for: PTT  SOCIAL HISTORY: Social History   Social History  . Marital Status: Married    Spouse Name: N/A  . Number of Children: 0  . Years of Education: N/A   Occupational History  . Not on file.   Social History Main Topics  . Smoking status: Never Smoker   . Smokeless tobacco: Former Systems developer  . Alcohol Use: 0.0 oz/week    0 Standard drinks or equivalent per week     Comment: social: Research scientist (life sciences) at [employer]  . Drug Use: No  . Sexual Activity: Not on  file   Other Topics Concern  . Not on file   Social History Narrative    FAMILY HISTORY: Family History  Problem Relation Age of Onset  . Cancer Mother     Breast  . Cancer Father     Lung    REVIEW OF SYSTEMS: Reviewed with patient as per history of present illness.  Psych: Patient has a history of dental phobia.   DENTAL HISTORY: CHIEF COMPLAINT: Patient was referred by Dr. Isidore Moos for a dental consultation.  HPI: [name] is a 40 year old male recently diagnosed with squamous cell carcinoma of the left tonsil. Patient with anticipated radiation therapy. Patient now seen as part of a medically necessary pre-radiation therapy dental protocol examination.  The patient currently denies acute toothaches, swellings, or abscesses. Patient was last seen by dentist one year ago for root canal therapy on tooth #19. This was with Dr. Remonia Richter, an Endodontist. The patient has not been seen by general dentist for at least 10 years. Patient has not seen a general dentist since being in [place] since 2010. The patient indicates that he is a dental phobic. Patient relates having problems with the dentist during orthodontic therapy as a teenager. Patient had the majority of his dental treatment when he was in [place] in approximately 1994/1995.   DENTAL EXAMINATION: GENERAL:The patient is a well-developed, well-nourished male in no acute distress.  HEAD AND NECK:There is left neck lymphadenopathy. I do not palpate any right neck lymphadenopathy. The patient denies acute TMJ symptoms.  INTRAORAL EXAM:Patient has normal saliva. Patient has bilateral mandibular lingual tori. There is no evidence of oral abscess formation.  DENTITION:Patient is missing tooth numbers 1, 16, 17, and 32. Tooth numbers 3 and 5 are retained root segments.  PERIODONTAL:Patient has chronic periodontitis with plaque and calculus accumulations, gingival recession, and incipient to moderate bone loss.  DENTAL  CARIES/SUBOPTIMAL RESTORATIONS: Multiple dental caries noted as per dental charting form.  ENDODONTIC:Patient currently denies acute pulpitis symptoms. Patient has periapical pathology associated with tooth numbers 5 and 8. Tooth #8 was nonresponsive with the electric pulp testing.  CROWN AND BRIDGE: There are no crown or bridge restorations.  PROSTHODONTIC:Patient denies having any partial dentures.  OCCLUSION:Patient has a class III malocclusion with bilateral anterior and posterior crossbite. RADIOGRAPHIC INTERPRETATION: An orthopantogram was taken and supplemented with a full series of dental radiographs. There are missing tooth numbers 1, 16, 17, and 32 with there are retained root segments in the area of tooth numbers 3 and 5. There multiple areas appeared pathology and radiolucency associated with tooth numbers 5 and 8. Dental caries are noted. Suboptimal dental restorations are noted. Previous root canal therapies associated with tooth numbers 3, 18, 19.  ASSESSMENTS: 1. Squamous cell carcinoma of the left tonsil 2. Preradiation therapy dental protocol 3. 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 Multiple retained root segments 5. Dental caries and suboptimal dental restorations 6. Bilateral mandibular lingual tori 7. Chronic periodontitis with bone loss 8. Gingival recession 9. Accretions 10. Multiple missing teeth 11. 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 I discussed the risks, benefits, and complications of various treatment options with the patient in relationship to his medical and dental conditions, anticipated radiation therapy, and radiation therapy side effects to include xerostomia, radiation caries, trismus, mucositis, taste changes, gum and jawbone changes, and risk for infection and osteoradionecrosis.  We discussed various treatment options to include no treatment, multiple extractions with  alveoloplasty, pre-prosthetic surgery as indicated, periodontal therapy, dental restorations, root canal therapy, crown and bridge therapy, implant therapy, and replacement of missing teeth as indicated. The patient currently wishes to proceed with referral to an oral surgeon for second opinion concerning extraction of tooth numbers 3, 5, 15, 18, 19 with consideration for additional extraction of tooth numbers 2 and 31 based on the anticipated dose to these teeth.  Patient will also be evaluated for bilateral mandibular lingual tori reductions. Patient has been referred to Dr. Benson Norway for a oral surgery consultation today with definitive treatment planning to be determined after further discussion with Dr. Isidore Moos concerning the anticipated doses to the posterior molars on the right side. Patient will then need to proceed with evaluation for root canal therapy on tooth #8 as well as dental restorations and initial periodontal therapy as indicated. Patient agreed to proceed with impressions today for the fabrication of future fluoride trays and scatter protection devices once it is has been determined which teeth will be extracted. After adequate healing of 2-3 weeks, the patient may then proceed with start of radiation therapy. Patient will need to be referred to a new primary dentist in the future that is able to provide dental treatment with emphasis on his dental phobia.   2. Discussion of findings with medical team and coordination of future medical and dental care as needed.  I spent in excess of  120 minutes during the conduct of this consultation and >50% of this time involved direct face-to-face encounter for counseling and/or coordination of the patient's care.    Lenn Cal, DDS

## 2016-04-24 NOTE — Patient Instructions (Signed)

## 2016-04-25 ENCOUNTER — Ambulatory Visit
Admission: RE | Admit: 2016-04-25 | Discharge: 2016-04-25 | Disposition: A | Discharge status: Home / Self Care | Payer: Managed Care, Other (non HMO) | Source: Ambulatory Visit | Attending: Radiation Oncology | Admitting: Radiation Oncology

## 2016-04-25 ENCOUNTER — Telehealth: Payer: Self-pay | Admitting: *Deleted

## 2016-04-25 ENCOUNTER — Ambulatory Visit: Payer: Managed Care, Other (non HMO) | Attending: Radiation Oncology | Admitting: Physical Therapy

## 2016-04-25 ENCOUNTER — Ambulatory Visit: Payer: Managed Care, Other (non HMO)

## 2016-04-25 ENCOUNTER — Encounter: Payer: Self-pay | Admitting: *Deleted

## 2016-04-25 DIAGNOSIS — R1310 Dysphagia, unspecified: Secondary | ICD-10-CM | POA: Insufficient documentation

## 2016-04-25 DIAGNOSIS — C090 Malignant neoplasm of tonsillar fossa: Secondary | ICD-10-CM

## 2016-04-25 DIAGNOSIS — R634 Abnormal weight loss: Secondary | ICD-10-CM

## 2016-04-25 DIAGNOSIS — R293 Abnormal posture: Secondary | ICD-10-CM | POA: Insufficient documentation

## 2016-04-25 DIAGNOSIS — M6281 Muscle weakness (generalized): Secondary | ICD-10-CM | POA: Diagnosis present

## 2016-04-25 LAB — TSH: TSH: 0.874 m(IU)/L (ref 0.320–4.118)

## 2016-04-25 NOTE — Telephone Encounter (Signed)
04/25/2016  Patient:            [name] Date of Birth:  11-17-1975 MRN:                [id number]   The patient was contacted concerning scheduling dental restoration and periodontal procedures prior to dental extractions scheduled for 05/03/2016 with Dr. Benson Norway.  Patient indicates that he is unable to make an appointment today and is leaving town at this time. Patient will return later in the evening on 05/02/2016 prior to his anticipated dental extraction date of 05/03/2016.  The patient will be contacted at later date to schedule the dental appointments as needed. L. Ingram/Dr. Enrique Sack

## 2016-04-25 NOTE — Therapy (Signed)
[place], [place], [address] Phone: [phone]   Fax:  [phone]  Physical Therapy Evaluation  Patient Details  Name: [name] MRN: [id number] Date of Birth: [birth year] Referring Provider: Dr. Eppie [name]  Encounter Date: 04/25/2016      PT End of Session - 04/25/16 1125    Visit Number 1   Number of Visits 1   PT Start Time 1015   PT Stop Time 1038   PT Time Calculation (min) 23 min   Activity Tolerance Patient tolerated treatment well   Behavior During Therapy [facility] for tasks assessed/performed      Past Medical History  Diagnosis Date  . Mitral valve prolapse   . GERD (gastroesophageal reflux disease)   . Weight loss     Past Surgical History  Procedure Laterality Date  . Elbow surgery Right   . Knee surgery Left   . Tonsillectomy Left 04/14/2016    Procedure: TONSILLECTOMY AND BIOPSY OF TONGUE ;  Surgeon: [name] Belfast, MD;  Location: [place];  Service: ENT;  Laterality: Left;  . Nasopharyngoscopy Left 04/14/2016    Procedure: BASE NASAL NASOPHARYNX;  Surgeon: [name] Belfast, MD;  Location: [place];  Service: ENT;  Laterality: Left;    There were no vitals filed for this visit.       Subjective Assessment - 04/25/16 1112    Subjective still with sore throat after left tonsillectomy 04/14/16   Patient is accompained by: Family member  wife   Pertinent History Presented with left neck swelling to ED at end of May; had been fatigued prior to that.  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  PET 04/07/16 showed solitary left level 2 lymph node enlarged and hypermetabolic.  Left tonsillectomy 04/14/16.   Patient Stated Goals get info from all head & neck clinic providers   Currently in Pain? Yes   Pain Score 2    Pain Location Throat   up to ear   Pain Orientation Left   Pain Descriptors / Indicators Sore   Pain Type Surgical pain   Pain Onset 1 to 4 weeks ago   Aggravating Factors  swallowing   Pain Relieving Factors not swallowing            [facility] PT Assessment - 04/25/16 1117    Assessment   Medical Diagnosis left tonsil squamous cell carcinoma, stage III with lymph node involvement   Referring Provider Dr. Eppie [name]   Onset Date/Surgical Date 04/14/16  left tonsillectomy   Precautions   Precautions Other (comment)   Precaution Comments cancer precautions   Restrictions   Weight Bearing Restrictions No   Balance Screen   Has the patient fallen in the past 6 months No   Has the patient had a decrease in activity level because of a fear of falling?  No   Is the patient reluctant to leave their home because of a fear of falling?  No   Home Environment   Living Environment Private residence   Living Arrangements Spouse/significant other   Type of Homeland Two level   Prior Function   Level of Pass [place] Full time employment  currently on disability   Vocation Requirements manages a country club, ensuring events and programs run smoothly; only occasional heavy lifting; lots of walking; also is  a sommelier   Leisure until onset of symptoms, worked out at gym 5 days a week, including elliptical, free weights, and resistance exercises   Cognition   Overall Cognitive Status Within Functional Limits for tasks assessed   Observation/Other Assessments   Observations left neck lump visible   Functional Tests   Functional tests Sit to Stand   Sit to Stand   Comments 18 times in 30 seconds   Posture/Postural Control   Posture/Postural Control Postural limitations   Postural Limitations Rounded Shoulders;Forward head   ROM / Strength   AROM / PROM / Strength AROM   AROM   Overall AROM Comments neck and shoulder AROM both grossly WFL throughout   Ambulation/Gait    Ambulation/Gait Yes   Ambulation/Gait Assistance 7: Independent           LYMPHEDEMA/ONCOLOGY QUESTIONNAIRE - 04/25/16 1123    Type   Cancer Type left tonsil, stage III   Surgeries   Other Surgery Date 04/14/16  tonsillectomy   Treatment   Active Radiation Treatment Yes   Lymphedema Assessments   Lymphedema Assessments Head and Neck   Head and Neck   4 cm superior to sternal notch around neck 37.6 cm   6 cm superior to sternal notch around neck 37.3 cm   8 cm superior to sternal notch around neck 38 cm   Other 10 cm. superior, 38.8                        PT Education - 04/25/16 1125    Education provided Yes   Education Details neck ROM, posture, walking, Cure article on staying active, lymphedema and PT info   Person(s) Educated Patient;Spouse   Methods Explanation;Handout   Comprehension Verbalized understanding                 Head and Neck Clinic Goals - 04/25/16 1129    Patient will be able to verbalize understanding of a home exercise program for cervical range of motion, posture, and walking.    Status Achieved   Patient will be able to verbalize understanding of proper sitting and standing posture.    Status Achieved   Patient will be able to verbalize understanding of lymphedema risk and availability of treatment for this condition.    Status Achieved           Plan - 04/25/16 1126    Clinical Impression Statement Young man with stage III head & neck cancer who was previously very fit, going to the gym 5 days a week, but hasn't been able to do this in some months due to onset of fatigue.  He has forward head posture as well as throat pain currently.     Rehab Potential Excellent   PT Frequency One time visit   PT Treatment/Interventions Patient/family education   PT Next Visit Plan None at this time; may need therapy if lymphedema develops or if he has difficulty regaining energy.   PT Home Exercise Plan neck  ROM, walking   Consulted and Agree with Plan of Care Patient      Patient will benefit from skilled therapeutic intervention in order to improve the following deficits and impairments:  Decreased activity tolerance, Postural dysfunction  Visit Diagnosis: Abnormal posture - Plan: PT plan of care cert/re-cert  Muscle weakness (generalized) - Plan: PT plan of care cert/re-cert     Problem List Patient Active Problem List   Diagnosis Date Noted  . Cancer  of tonsillar fossa ([place]) 04/21/2016  . Mass of left side of neck 04/14/2016  . 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 Neck mass 04/14/2016    SALISBURY,DONNA 04/25/2016, 11:32 AM  [place], [place], [address] Phone: [phone]   Fax:  [phone]  Name: [name] MRN: [id number] Date of Birth: 05-20-1976   Serafina Royals, PT 04/25/2016 11:32 AM

## 2016-04-25 NOTE — Patient Instructions (Signed)
SWALLOWING EXERCISES Do these 6 of the 7 days per week until 6 months after your last day of radiation, then 2-3 times per week afterwards  1. Effortful Swallows - Press your tongue against the roof of your mouth for 3 seconds, then squeeze          the muscles in your neck while you swallow your saliva or a sip of water - Repeat 15-20 times, 2-3 times a day, and use whenever you eat or drink  2. Masako Swallow - swallow with your tongue sticking out - Stick tongue out past your teeth and gently bite tongue with your teeth - Swallow, while holding your tongue with your teeth - Repeat 15-20 times, 2-3 times a day *use a wet spoon if your mouth gets dry*  3. Shaker Exercise - head lift - Lie flat on your back in your bed or on a couch without pillows - Raise your head and look at your feet - KEEP YOUR SHOULDERS DOWN - HOLD FOR 45-60 SECONDS, then lower your head back down - Repeat 3 times, 2-3 times a day  4. Mendelsohn Maneuver - "half swallow" exercise - Start to swallow, and keep your Adam's apple up by squeezing hard with the            muscles of the throat - Hold the squeeze for 5-7 seconds and then relax - Repeat 15-20 times, 2-3 times a day *use a wet spoon if your mouth gets dry*  5. Breath Hold - Say "HUH!" loudly, then hold your breath for 3 seconds at your voice box - Repeat 20 times, 2-3 times a day  6. Chin pushback - Open your mouth  - Place your fist UNDER your chin near your neck, and push back with your fist for 5 seconds - Repeat 10 times, 2-3 times a day

## 2016-04-25 NOTE — Telephone Encounter (Signed)
Called patient to give info and ask question, no answer will call later.

## 2016-04-25 NOTE — Progress Notes (Signed)
Head & Neck Multidisciplinary Clinic Clinical Social Work  Clinical Social Work met with patient/family at head & neck multidisciplinary clinic to offer support and assess for psychosocial needs.  [name] was accompanied by his spouse, [name].  [name] works at [employer], but is on leave from work throughout [place] treatment.  [name] only concerns at this time was being able to eat and managing side effects.  [name]'s main concern was the patient maintaining a sense of routine through treatment.  CSW and patient/spouse discussed ways to maintain routine and utilize support system.  CSW discussed symptoms of anxiety and depression and importance of utilizing CSW support through treatment if they begin to experience symptoms.    Clinical Social Work briefly discussed Clinical Social Work role and [employer] support programs/services.  Clinical Social Work encouraged patient to call with any additional questions or concerns.   Polo Riley, MSW, LCSW, OSW-C Clinical Social Worker [facility] [phone]

## 2016-04-25 NOTE — Progress Notes (Signed)
  Oncology Nurse Navigator Documentation  Navigator Location: [facility] Onc (04/25/16 6387) Navigator Encounter Type: Clinic/MDC (04/25/16 5643)             Treatment Phase: Pre-Tx/Tx Discussion (04/25/16 3295)       Met with Mr. Ostergaard during H&N [place].    Arrived him to Nursing, provided verbal and written overview of [place], the clinicians who will be seeing him, encouraged him to ask questions during their time with him.  He informed he was seen by Dr. Benson Norway yesterday after his appointment with Dr. Enrique Sack, has extractions scheduled for 05/03/16.  I informed him he has a lab appointment this morning after [place], explained registration procedure.  He was seen by SW, SLP and PT.  Spoke with him at end of [facility], addressed questions.  [name], RN, BSN, [place] at [place] [phone]                          Time Spent with Patient: 60 (04/25/16 0160)

## 2016-04-25 NOTE — Therapy (Signed)
[facility] [address] [place], [place], [address] Phone: [phone]   Fax:  [phone]  Speech Language Pathology Evaluation  Patient Details  Name: [name] MRN: [id number] Date of Birth: 11/18/1975 Referring Provider: Eppie Gibson M.D.  Encounter Date: 04/25/2016      End of Session - 04/25/16 1208    Visit Number 1   Number of Visits 7   Date for SLP Re-Evaluation 10/26/16   SLP Start Time 41   SLP Stop Time  1000   SLP Time Calculation (min) 45 min   Activity Tolerance Patient tolerated treatment well      Past Medical History  Diagnosis Date  . Mitral valve prolapse   . GERD (gastroesophageal reflux disease)   . Weight loss     Past Surgical History  Procedure Laterality Date  . Elbow surgery Right   . Knee surgery Left   . Tonsillectomy Left 04/14/2016    Procedure: TONSILLECTOMY AND BIOPSY OF TONGUE ;  Surgeon: Jerrell Belfast, MD;  Location: [place];  Service: ENT;  Laterality: Left;  . Nasopharyngoscopy Left 04/14/2016    Procedure: BASE NASAL NASOPHARYNX;  Surgeon: Jerrell Belfast, MD;  Location: [place];  Service: ENT;  Laterality: Left;    There were no vitals filed for this visit.          SLP Evaluation [facility] - 04/25/16 [id number]    SLP Visit Information   SLP Received On 04/25/16   Referring Provider Eppie Gibson M.D.   Medical Diagnosis lt tonsilar [facility]   Pain Assessment   Pain Score 2    Pain Location Ear   Pain Orientation Left   Pain Type Acute pain   Prior Functional Status   Cognitive/Linguistic Baseline Within functional limits   Cognition   Overall Cognitive Status Within Functional Limits for tasks assessed   Auditory Comprehension   Overall Auditory Comprehension Appears within functional limits for tasks assessed   Verbal Expression   Overall Verbal Expression Appears within functional limits for tasks assessed   Oral  Motor/Sensory Function   Overall Oral Motor/Sensory Function Appears within functional limits for tasks assessed   Motor Speech   Overall Motor Speech Appears within functional limits for tasks assessed     Pt currently tolerates softer diet with thin liquids, mostly due to tonsillectomy/biopsy removal.  POs: Pt ate bites of ham sandwith and drank water without overt s/s aspiration. Initially pt was noted to drink with open mouth posture, SLP cued him to close mouth during PO liquid intake. This was likely a learned behavior as a result of his surgery.Thyroid elevation appeared WNL, and swallows appeared timely. Oral residue noted as WNL. Pt's swallow deemed WFL/WNL at this time. Pt told SLP that swallowing is affected by pain near his rt ear.  Until yesterday pt had experienced coughing at slightly more than average frequency with liquids. Frequency of cough with liquids has decr'd much in the last 2-3 days. SLP assumes this is might be due to decr'd edema as a result of cont'd healing in tonsillar area.  Because data states the risk for dysphagia during and after radiation treatment is high due to undergoing radiation tx, SLP taught pt about the possibility of reduced/limited ability for PO intake during rad tx. SLP encouraged pt to continue swallowing POs as far into rad tx as possible, even ingesting POs and/or completing HEP shortly after administration of pain meds.   SLP educated pt re:  changes to swallowing musculature after rad tx, and why adherence to dysphagia HEP provided today and PO consumption was necessary to inhibit muscular disuse atrophy and to reduce muscle fibrosis following rad tx. Pt demonstrated understanding of these things to SLP.    SLP then developed a HEP for pt and pt was instructed how to perform exercises involving lingual, vocal, and pharyngeal strengthening. SLP performed each exercise and pt return demonstrated each exercise. SLP ensured pt performance was correct  prior to moving on to next exercise. Pt was instructed to complete this program 2-3 times a day, 6-7 days/week until 6 months after their last rad tx, then x2-3 a week after that.                     SLP Education - 04/25/16 1207    Education provided Yes   Education Details HEP, late effects head/neck radiation on swallowing skills   Person(s) Educated Patient;Spouse   Methods Explanation;Handout;Demonstration;Verbal cues   Comprehension Verbalized understanding;Returned demonstration;Need further instruction          SLP Short Term Goals - 04/25/16 1211    SLP SHORT TERM GOAL #1   Title pt will demo HEP with rare min A over 2 sessions   Time 3   Period --  visits   Status New   SLP SHORT TERM GOAL #2   Title pt will tell SLP why he is completing HEP over two sessions   Time 3   Period --  visits   Status New   SLP SHORT TERM GOAL #3   Title pt will tell SLP 3 s/s aspiration PNA with modified independence over two sesions   Time 3   Period --  visits   Status New          SLP Long Term Goals - 04/25/16 1212    SLP LONG TERM GOAL #1   Title pt will demo HEP with independence over 3 sessions   Time 6   Period --  visits   Status New   SLP LONG TERM GOAL #2   Title pt will tell SLP how a food journal can assist return to full WNL PO diet   Time 6   Period --  visits   Status New          Plan - 04/25/16 1209    Clinical Impression Statement Pt presents with swallowing abilities WFL/WNL today. however pt's swallowing skills  may decline over his course of radiaiton therapy. Skilled ST is necessary to continually assess pt's swallowing safety during and after treatment, as well as to assess continued correct procedure with HEP.    Speech Therapy Frequency --  approx every four weeks   Duration --  6 therapy visits   Treatment/Interventions Aspiration precaution training;Pharyngeal strengthening exercises;Diet toleration management by  SLP;Trials of upgraded texture/liquids;SLP instruction and feedback;Patient/family education;Compensatory techniques  any combination or all may be used   Potential to Achieve Goals Good   SLP Home Exercise Plan provided today   Consulted and Agree with Plan of Care Patient      Patient will benefit from skilled therapeutic intervention in order to improve the following deficits and impairments:   Dysphagia    Problem List Patient Active Problem List   Diagnosis Date Noted  . Cancer of tonsillar fossa ([place]) 04/21/2016  . Mass of left side of neck 04/14/2016  . Metastatic cancer to cervical lymph nodes ([place]) 04/14/2016  . Neck mass 04/14/2016  [facility] ,[place], [place]  04/25/2016, 12:14 PM  [place] [address] [place], [place], [address] Phone: [phone]   Fax:  [phone]  Name: [name] MRN: [id number] Date of Birth: [birth year]

## 2016-04-26 ENCOUNTER — Telehealth: Payer: Self-pay | Admitting: *Deleted

## 2016-04-26 ENCOUNTER — Other Ambulatory Visit: Payer: Self-pay | Admitting: Radiation Oncology

## 2016-04-26 DIAGNOSIS — C090 Malignant neoplasm of tonsillar fossa: Secondary | ICD-10-CM

## 2016-04-26 NOTE — Telephone Encounter (Signed)
Called patient to inform of appts. On 05/09/16, vm has not been set up, therefore I could not leave a message, i will try again later.

## 2016-05-02 ENCOUNTER — Telehealth: Payer: Self-pay | Admitting: *Deleted

## 2016-05-02 NOTE — Telephone Encounter (Signed)
  Oncology Nurse Navigator Documentation  Navigator Location: [facility] On[facility] (05/02/16 1509) Navigator Encounter Type: Telephone (05/02/16 1509) Telephone: [name] (05/02/16 1509)                   Called [name] to follow-up on my offer of a Patient Mentor; he confirmed his interest.  I later called him, provide him mentor name and phone number [name]), explained [employer] will contact him in the next day or so.  I explained general components of mentoring, assuring him of relationship confidentiality.  He expressed concern about upfront cost of extractions tomorrow with Dr. Benson Norway.  I confirmed with [place]'s office that his medical insurance does not cover this cost, relayed this information to him.  He understands i can be contacted with needs/concerns.  [name], RN, BSN, [place] at [place] [phone]                           Time Spent with Patient: 30 (05/02/16 1509)

## 2016-05-09 ENCOUNTER — Ambulatory Visit
Admission: RE | Admit: 2016-05-09 | Discharge: 2016-05-09 | Disposition: A | Discharge status: Home / Self Care | Payer: Managed Care, Other (non HMO) | Source: Ambulatory Visit | Attending: Radiation Oncology | Admitting: Radiation Oncology

## 2016-05-09 ENCOUNTER — Ambulatory Visit (HOSPITAL_COMMUNITY): Payer: Self-pay | Admitting: Dentistry

## 2016-05-09 ENCOUNTER — Encounter (HOSPITAL_COMMUNITY): Payer: Self-pay | Admitting: Dentistry

## 2016-05-09 ENCOUNTER — Telehealth: Payer: Self-pay | Admitting: *Deleted

## 2016-05-09 ENCOUNTER — Ambulatory Visit: Payer: Managed Care, Other (non HMO)

## 2016-05-09 ENCOUNTER — Other Ambulatory Visit: Payer: Managed Care, Other (non HMO)

## 2016-05-09 ENCOUNTER — Encounter: Payer: Self-pay | Admitting: *Deleted

## 2016-05-09 ENCOUNTER — Inpatient Hospital Stay: Admission: RE | Admit: 2016-05-09 | Payer: Managed Care, Other (non HMO) | Source: Ambulatory Visit

## 2016-05-09 VITALS — BP 129/77 | HR 85 | Temp 98.7°F

## 2016-05-09 DIAGNOSIS — K08409 Partial loss of teeth, unspecified cause, unspecified class: Secondary | ICD-10-CM

## 2016-05-09 DIAGNOSIS — Z01818 Encounter for other preprocedural examination: Secondary | ICD-10-CM

## 2016-05-09 DIAGNOSIS — Z463 Encounter for fitting and adjustment of dental prosthetic device: Secondary | ICD-10-CM | POA: Diagnosis not present

## 2016-05-09 DIAGNOSIS — Z510 Encounter for antineoplastic radiation therapy: Secondary | ICD-10-CM | POA: Diagnosis not present

## 2016-05-09 DIAGNOSIS — C090 Malignant neoplasm of tonsillar fossa: Secondary | ICD-10-CM

## 2016-05-09 LAB — BUN AND CREATININE (CC13)
BUN: 10.6 mg/dL (ref 7.0–26.0)
Creatinine: 1.1 mg/dL (ref 0.7–1.3)
EGFR: 80 mL/min/{1.73_m2} — AB (ref >90)

## 2016-05-09 NOTE — Patient Instructions (Signed)
PLAN: 1. Continue with salt water rinses as needed to aid healing. 2. Maintain oral hygiene as best able after surgical procedures. Brush tongue daily. 3. Start fluoride in fluoride trays after additional healing from dental extractions. 4. Prescription for FluoriSHIELD was sent to [place] with as needed refills. 5. Patient will be referred to Dr. Delphia Grates for evaluation for dental restorations, periodontal therapy, and questionable root canal therapy #8 with nitrous oxide or sedation as indicated. Release of information was previously obtained. 6. Simulation appointment with Dr. Isidore Moos as indicated after evaluation of facial swelling and ability to proceed with simulation-today. 7. Return to dental medicine in 2-3 weeks for an oral examination during radiation therapy. Patient to call for appointment.    Lenn Cal, DDS

## 2016-05-09 NOTE — Progress Notes (Signed)
Head and Neck Cancer Simulation, IMRT treatment planning note   Outpatient  Diagnosis:    ICD-9-CM ICD-10-CM   1. Cancer of tonsillar fossa (HCC) 146.1 C09.0     The patient was taken to the CT simulator and laid in the supine position on the table. An Aquaplast head and shoulder mask was custom fitted to the patient's anatomy. High-resolution CT axial imaging was obtained of the head and neck with contrast. I verified that the quality of the imaging is good for treatment planning. 1 Medically Necessary Treatment Device was fabricated and supervised by me: Aquaplast mask.   Treatment planning note I plan to treat the patient with IMRT. I plan to treat the patient's b/l tonsils (due to findings on PET and physical exam) and bilateral neck nodes. I plan to treat to a total dose of 70 Gray in 35 fractions. BID treatments once/week, otherwise QD treatment. Dose calculation was ordered from dosimetry.  IMRT planning Note  IMRT is an important modality to deliver adequate dose to the patient's at risk tissues while sparing the patient's normal structures, including the: esophagus, parotid tissue, mandible, brain stem, spinal cord, oral cavity, brachial plexus.  This justifies the use of IMRT in the patient's treatment.   -----------------------------------  Eppie Gibson, MD  This document serves as a record of services personally performed by Eppie Gibson , MD. It was created on his behalf by Truddie Hidden, a trained medical scribe. The creation of this record is based on the scribe's personal observations and the provider's statements to them. This document has been checked and approved by the attending provider.

## 2016-05-09 NOTE — Progress Notes (Addendum)
Does patient have an allergy to IV contrast dye?: No   Has patient ever received premedication for IV contrast dye?: No  Does patient take metformin?: No  If patient does take metformin when was the last dose: N/A  Date of lab work: 05-09-16 11;23 A.M. BUN: 10.6 CR: 1.1  IV site: Left antecubital tolerating well No pacemaker

## 2016-05-09 NOTE — Progress Notes (Signed)
PROGRESS NOTE:  05/09/2016 [name] [id number]  VITALS: BP 129/77   Pulse 85   Temp 98.7 F (37.1 C) (Oral)   [name] is status post extraction of tooth numbers 2, 3, 5, 15, 18, 19, and 31 with alveoloplasty and bilateral tori reductions with Dr. Duaine [name] surgeon on 05/03/16. Patient now presents for insertion of upper and lower fluoride trays and scatter protection devices.  SUBJECTIVE: Patient indicates that he is having discomfort and swelling after the dental extractions with alveoloplasty and tori reductions.  Patient has been using his pain medication as needed. Patient is using salt water rinses as needed. Patient is scheduled to see Dr. Benson [name] later this afternoon for evaluation of healing. Patient is also scheduled for simulation appointment after this appointment.  EXAM: There is no sign of infection, heme, or ooze.  Sutures are intact. Patient is healing in by generalized primary closure.    PROCEDURE: Appliances were tried in and adjusted as needed. [name] ([place]). Trismus device was previously fabricated at 42 mm and 25 sticks. Postop instructions were provided in a written and verbal format concerning the use and care of appliances. All questions were answered.  ASSESSMENT: Loss of teeth due to extraction by Dr. Benson [name], oral surgeon Healing is consistent with dental procedures performed by Dr. Benson [name]. Dental caries Periapical pathology  PLAN: 1. Continue with salt water rinses as needed to aid healing. 2. Maintain oral hygiene as best able after surgical procedures. Brush tongue daily. 3. Start fluoride in fluoride trays after additional healing from dental extractions. 4. Prescription for FluoriSHIELD was sent to [facility] with as needed refills. 5. Patient will be referred to Dr. Delphia [name] for evaluation for dental restorations, periodontal therapy, and questionable root canal therapy #8 with nitrous oxide or sedation as indicated.  Release of information was previously obtained. 6. Simulation appointment with Dr. Isidore [name] as indicated after evaluation of facial swelling and ability to proceed with simulation-today. 7. Return to dental medicine in 2-3 weeks for an oral examination during radiation therapy. Patient to call for appointment.    [name], DDS

## 2016-05-12 ENCOUNTER — Telehealth: Payer: Self-pay | Admitting: *Deleted

## 2016-05-12 DIAGNOSIS — Z510 Encounter for antineoplastic radiation therapy: Secondary | ICD-10-CM | POA: Diagnosis not present

## 2016-05-12 NOTE — Telephone Encounter (Signed)
CALLED PATIENT TO INFORM OF APPT. @ [facility] FOR PT, APPT. WILL BE ON 05-16-16 - ARRIVAL TIME - 1:30 PM, SPOKE WITH PATIENT AND HE IS AWARE OF THIS

## 2016-05-12 NOTE — Progress Notes (Signed)
  Oncology Nurse Navigator Documentation  To provide support, encouragement and care continuity, met with [name] during his CT SIM.  He was accompanied by his wife. He tolerated procedure with difficulty, denied any concerns. He noted he had been contacted by his mentor, said their initial conversation was very helpful. He understands I can be contacted prior to the initiation of his Tomotherapy on Monday, 05/22/16.  [name], RN, BSN, [place] at [facility] [phone]   Navigator Location: [facility] (05/09/16 1230) Navigator Encounter Type: Other (05/09/16 1230)           Patient Visit Type: RadOnc (05/09/16 1230) Treatment Phase: CT SIM (05/09/16 1230)                            Time Spent with Patient: 75 (05/09/16 1230)

## 2016-05-16 ENCOUNTER — Ambulatory Visit: Payer: Managed Care, Other (non HMO) | Attending: Radiation Oncology | Admitting: Physical Therapy

## 2016-05-16 ENCOUNTER — Ambulatory Visit: Payer: Self-pay | Admitting: Physical Therapy

## 2016-05-16 DIAGNOSIS — R1310 Dysphagia, unspecified: Secondary | ICD-10-CM | POA: Insufficient documentation

## 2016-05-16 DIAGNOSIS — R293 Abnormal posture: Secondary | ICD-10-CM | POA: Diagnosis present

## 2016-05-16 DIAGNOSIS — M6281 Muscle weakness (generalized): Secondary | ICD-10-CM | POA: Insufficient documentation

## 2016-05-16 DIAGNOSIS — M25512 Pain in left shoulder: Secondary | ICD-10-CM | POA: Insufficient documentation

## 2016-05-16 NOTE — Therapy (Signed)
[place], [place], [address] Phone: [phone]   Fax:  [phone]  Physical Therapy Evaluation  Patient Details  Name: [name] MRN: [id number] Date of Birth: 09/08/1976 Referring Provider: Isidore Moos  Encounter Date: 05/16/2016      PT End of Session - 05/16/16 1117    Visit Number 1   Number of Visits 9   Date for PT Re-Evaluation 06/13/16   PT Start Time 1020   PT Stop Time 1107   PT Time Calculation (min) 47 min   Activity Tolerance Patient tolerated treatment well   Behavior During Therapy [facility] for tasks assessed/performed      Past Medical History:  Diagnosis Date  . GERD (gastroesophageal reflux disease)   . Mitral valve prolapse   . Weight loss     Past Surgical History:  Procedure Laterality Date  . ELBOW SURGERY Right   . KNEE SURGERY Left   . NASOPHARYNGOSCOPY Left 04/14/2016   Procedure: BASE NASAL NASOPHARYNX;  Surgeon: Jerrell Belfast, MD;  Location: [place];  Service: ENT;  Laterality: Left;  . TONSILLECTOMY Left 04/14/2016   Procedure: TONSILLECTOMY AND BIOPSY OF TONGUE ;  Surgeon: Jerrell Belfast, MD;  Location: [place];  Service: ENT;  Laterality: Left;    There were no vitals filed for this visit.       Subjective Assessment - 05/16/16 1035    Subjective A year ago I had a hard time swallowing there was something in my neck. I went to the doctor. Over the course of the last two to three years I had what I thought was strep throat and tonsillitis. In March I was down and in bed and thought I was sick again. I was sick for two weeks and then I noticed the swelling. I was having awful night sweats and had chest pains. I went to the ER and they sent me to have a needle biopsy. I was diagnosed with throat cancer. Removed left tonsil, left the right tonsil, removed part of the nasopharyngeal area to biopsy. They biopsied by throat and base of  my tongue and nasopharyngeal areas on both sides. The took out six of my teeth and the tori bone.    Pertinent History Presented with left neck swelling to ED at end of May; had been fatigued prior to that.  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  PET 04/07/16 showed solitary left level 2 lymph node enlarged and hypermetabolic.  Left tonsillectomy 04/14/16.   Patient Stated Goals to be able to lift left arm over head without pain   Currently in Pain? No/denies   Pain Score 0-No pain            [facility] PT Assessment - 05/16/16 0001      Assessment   Medical Diagnosis left tonsil squamous cell carcinoma, stage III with lymph node involvement   Referring Provider Squire   Onset Date/Surgical Date 04/14/16  left tonsillectomy   Hand Dominance Right   Prior Therapy none     Precautions   Precautions Other (comment)  at risk for lymphedema     Restrictions   Weight Bearing Restrictions No     Balance Screen   Has the patient fallen in the past 6 months No   Has the patient had a decrease in activity level because of a fear of falling?  No   Is the patient reluctant to leave their home because of a fear of falling?  No     Home Ecologist residence   Living Arrangements Spouse/significant other   Available Help at Discharge Family   Type of [place] to enter   Entrance Stairs-Number of Steps 3   Entrance Stairs-Rails None   Home Layout Two level   Alternate Level Stairs-Number of Steps 14   Alternate Level Stairs-Rails Right   Home Equipment None     Prior Function   Level of Independence Independent   Vocation Full time employment  currently using his vacation time and not working   [employer] a country club, ensuring events and programs run smoothly; only occasional heavy lifting; lots of  walking; also is a sommelier   Leisure until onset of symptoms, worked out at gym 5 days a week, including elliptical, free weights, and resistance exercises     Cognition   Overall Cognitive Status Within Functional Limits for tasks assessed     Observation/Other Assessments   Observations left neck lump visible     ROM / Strength   AROM / PROM / Strength AROM     AROM   Overall AROM Comments neck and shoulder AROM both grossly WFL throughout  pain in left shoulder during AROM           LYMPHEDEMA/ONCOLOGY QUESTIONNAIRE - 05/16/16 1101      Type   Cancer Type left tonsil, stage III     Surgeries   Other Surgery Date 04/14/16  tonsillectomy     Treatment   Active Chemotherapy Treatment No   Past Chemotherapy Treatment No   Active Radiation Treatment No  starts on Monday   Past Radiation Treatment No     What other symptoms do you have   Are you Having Heaviness or Tightness No     Lymphedema Assessments   Lymphedema Assessments Head and Neck     Head and Neck   4 cm superior to sternal notch around neck 40.5 cm   6 cm superior to sternal notch around neck 37.5 cm   8 cm superior to sternal notch around neck 37.4 cm   Other 38.5 10 cm superior to sternal notch                        PT Education - 05/16/16 1123    Education provided Yes   Education Details anatomy and physiology of lymphatic system, importance of walking to increase endurance   Person(s) Educated Patient   Methods Explanation   Comprehension Verbalized understanding                Long Term Clinic Goals - 05/16/16 1123      CC Long Term Goal  #1   Title Pt will report a 60% improvement in left shoulder pain when taking his shirt off over his head.   Time 4   Period Weeks   Status New     CC Long Term Goal  #2   Title Pt will be independent in a home exercise program for strengthening   Time 4   Period Weeks   Status New     CC Long Term Goal  #3   Title  Pt will be able to verbalize signs and symptoms of lymphedema   Time 4   Period Weeks   Status  New         Head and Neck Clinic Goals - 04/25/16 1129      Patient will be able to verbalize understanding of a home exercise program for cervical range of motion, posture, and walking.    Status Achieved     Patient will be able to verbalize understanding of proper sitting and standing posture.    Status Achieved     Patient will be able to verbalize understanding of lymphedema risk and availability of treatment for this condition.    Status Achieved           Plan - 05/16/16 1119    Clinical Impression Statement Pt presents with left neck mass that he states is an enlarged lymph nodes and complaints of left shoulder pain when taking off his shirt. He also has been more fatigued lately and prior to his cancer diagnosis he was exercising 5 days a week at the gym. 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 He would benefit from skilled PT services to increase left upper trap strength and left shoulder and scapular strength to help decrease left shoulder pain.    Rehab Potential Excellent   Clinical Impairments Affecting Rehab Potential starting radiation   PT Frequency 2x / week   PT Duration 4 weeks   PT Treatment/Interventions ADLs/Self Care Home Management;Electrical Stimulation;Therapeutic exercise;Manual techniques;Patient/family education;Passive range of motion   PT Next Visit Plan give Quick DASH, begin scapular strengthening, upper trap strengthening,    PT Home Exercise Plan neck ROM, walking   Consulted and Agree with Plan of Care Patient      Patient will benefit from skilled therapeutic intervention in order to improve the following deficits and impairments:  Decreased activity tolerance, Postural  dysfunction, Decreased strength, Impaired UE functional use  Visit Diagnosis: Pain in left shoulder - Plan: PT plan of care cert/re-cert  Abnormal posture - Plan: PT plan of care cert/re-cert  Muscle weakness (generalized) - Plan: PT plan of care cert/re-cert     Problem List Patient Active Problem List   Diagnosis Date Noted  . Cancer of tonsillar fossa ([place]) 04/21/2016  . Mass of left side of neck 04/14/2016  . 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 Neck mass 04/14/2016    Alexia Freestone 05/16/2016, 11:27 AM  [place], [place], [address] Phone: [phone]   Fax:  [phone]  Name: Kierin Tempest MRN: [id number] Date of Birth: [birth year]  Allyson Sabal, PT 05/16/16 11:27 AM

## 2016-05-17 ENCOUNTER — Ambulatory Visit: Payer: Managed Care, Other (non HMO)

## 2016-05-17 ENCOUNTER — Ambulatory Visit: Payer: Managed Care, Other (non HMO) | Admitting: Radiation Oncology

## 2016-05-18 ENCOUNTER — Ambulatory Visit: Payer: Managed Care, Other (non HMO)

## 2016-05-19 ENCOUNTER — Ambulatory Visit: Payer: Managed Care, Other (non HMO)

## 2016-05-22 ENCOUNTER — Encounter: Payer: Self-pay | Admitting: *Deleted

## 2016-05-22 ENCOUNTER — Ambulatory Visit
Admission: RE | Admit: 2016-05-22 | Discharge: 2016-05-22 | Disposition: A | Discharge status: Home / Self Care | Payer: Managed Care, Other (non HMO) | Source: Ambulatory Visit | Attending: Radiation Oncology | Admitting: Radiation Oncology

## 2016-05-22 ENCOUNTER — Telehealth: Payer: Self-pay | Admitting: *Deleted

## 2016-05-22 ENCOUNTER — Encounter: Payer: Self-pay | Admitting: Radiation Oncology

## 2016-05-22 VITALS — BP 118/63 | HR 76 | Temp 97.5°F | Ht 71.0 in | Wt 174.1 lb

## 2016-05-22 DIAGNOSIS — C090 Malignant neoplasm of tonsillar fossa: Secondary | ICD-10-CM

## 2016-05-22 DIAGNOSIS — Z510 Encounter for antineoplastic radiation therapy: Secondary | ICD-10-CM | POA: Diagnosis not present

## 2016-05-22 NOTE — Progress Notes (Signed)
   Weekly Management Note:  Outpatient    ICD-9-CM ICD-10-CM   1. Cancer of tonsillar fossa (HCC) 146.1 C09.0     Current Dose:  2 Gy  Projected Dose: 70 Gy   Narrative:  The patient presents for routine under treatment assessment.  CBCT/MVCT images/Port film x-rays were reviewed.  The chart was checked. No new complaints. Doing well.   Physical Findings:  Wt Readings from Last 3 Encounters:  05/22/16 174 lb 1.6 oz (79 kg)  04/25/16 173 lb (78.5 kg)  04/21/16 174 lb 1.6 oz (79 kg)    height is 5\' 11"  (1.803 m) and weight is 174 lb 1.6 oz (79 kg). His temperature is 97.5 F (36.4 C). His blood pressure is 118/63 and his pulse is 76.  no oropharyngeal thrush or mucositis. Left upper neck mass is visible  CBC    Component Value Date/Time   WBC 7.4 03/12/2016 0345   RBC 4.83 03/12/2016 0345   HGB 14.4 03/12/2016 0345   HGB 15.7 10/22/2012 0146   HCT 41.6 03/12/2016 0345   HCT 46.1 10/22/2012 0146   PLT 227 03/12/2016 0345   PLT 254 10/22/2012 0146   MCV 86.1 03/12/2016 0345   MCV 88 10/22/2012 0146   MCH 29.8 03/12/2016 0345   MCHC 34.6 03/12/2016 0345   RDW 12.5 03/12/2016 0345   RDW 12.7 10/22/2012 0146     CMP     Component Value Date/Time   NA 138 03/12/2016 0345   NA 140 10/22/2012 0146   K 3.4 (L) 03/12/2016 0345   K 3.9 10/22/2012 0146   CL 104 03/12/2016 0345   CL 103 10/22/2012 0146   CO2 28 03/12/2016 0345   CO2 27 10/22/2012 0146   GLUCOSE 118 (H) 03/12/2016 0345   GLUCOSE 113 (H) 10/22/2012 0146   BUN 10.6 05/09/2016 1123   CREATININE 1.1 05/09/2016 1123   CALCIUM 9.4 03/12/2016 0345   CALCIUM 8.8 10/22/2012 0146   GFRNONAA >60 03/12/2016 0345   GFRNONAA >60 10/22/2012 0146   GFRAA >60 03/12/2016 0345   GFRAA >60 10/22/2012 0146     Impression:  The patient is tolerating radiotherapy.   Plan:  Continue radiotherapy as planned.    -----------------------------------  Eppie Gibson, MD

## 2016-05-22 NOTE — Progress Notes (Signed)
IMRT Device Note outpatient    ICD-9-CM ICD-10-CM   1. Cancer of tonsillar fossa (HCC) 146.1 C09.0     10.5 delivered field widths represent one set of IMRT treatment devices. The code is [phone].  -----------------------------------  Eppie Gibson, MD

## 2016-05-22 NOTE — Progress Notes (Signed)
Mr. Ricketson received his first fraction today.  No voiced concerns.  Given Radiation Therapy and You Booklet and Sonafine.    BP 118/63 (BP Location: Right Arm, Patient Position: Sitting, Cuff Size: Normal)   Pulse 76   Temp 97.5 F (36.4 C)   Ht 5\' 11"  (1.803 m)   Wt 174 lb 1.6 oz (79 kg)   BMI 24.28 kg/m    Wt Readings from Last 3 Encounters:  05/22/16 174 lb 1.6 oz (79 kg)  04/25/16 173 lb (78.5 kg)  04/21/16 174 lb 1.6 oz (79 kg)

## 2016-05-22 NOTE — Progress Notes (Signed)
  Oncology Nurse Navigator Documentation  To provide support, encouragement and care continuity, met with Mr. Daffin for his initial Tomo RT.  He was accompanied by his wife.  I reviewed the 2-step treatment process, reminded him of the registration/arrival procedure for subsequent treatments.  His wife observed the treatment, RTT Melanie explained procedure.    Mr. Mccarter completed treatment without difficulty, denied questions/concerns.  I addressed his questions about a PT lyphedema appt he had on 8/1 as he was seen on 7/11 in [facility] for baseline evaluation.  He noted this appt included scheduling of additional appts for lymphedema treatment; and evaluation of his L shoulder.  He noted he did not want to pursue PT for his shoulder at this time; I questioned the appropriateness of lymphedema treatment since he just started treatment today.  I indicated I would arrange cancellation of appointments.  They understand I will be checking with him throughout treatment, that I can be contacted with needs/concerns.   [name], RN, BSN, [place] at [facility] [phone]   Navigator Location: [facility] (05/22/16 1020) Navigator Encounter Type: Treatment (05/22/16 1020)         Treatment Initiated Date: 05/22/16 (05/22/16 1020) Patient Visit Type: RadOnc (05/22/16 1020) Treatment Phase: First Radiation Tx (05/22/16 1020) Barriers/Navigation Needs: Education (05/22/16 1020)                Acuity: Level 2 (05/22/16 1020)         Time Spent with Patient: 60 (05/22/16 1020)

## 2016-05-22 NOTE — Telephone Encounter (Signed)
CALLED PATIENT TO INFORM OF NUTRITION APPTS., NO ANSWER WILL CALL LATER

## 2016-05-23 ENCOUNTER — Other Ambulatory Visit (HOSPITAL_COMMUNITY): Payer: Self-pay | Admitting: Dentistry

## 2016-05-23 ENCOUNTER — Ambulatory Visit
Admission: RE | Admit: 2016-05-23 | Discharge: 2016-05-23 | Disposition: A | Discharge status: Home / Self Care | Payer: Managed Care, Other (non HMO) | Source: Ambulatory Visit | Attending: Radiation Oncology | Admitting: Radiation Oncology

## 2016-05-23 ENCOUNTER — Ambulatory Visit: Payer: Managed Care, Other (non HMO) | Admitting: Physical Therapy

## 2016-05-23 DIAGNOSIS — Z510 Encounter for antineoplastic radiation therapy: Secondary | ICD-10-CM | POA: Diagnosis not present

## 2016-05-23 DIAGNOSIS — C090 Malignant neoplasm of tonsillar fossa: Secondary | ICD-10-CM

## 2016-05-23 MED ORDER — SODIUM FLUORIDE 1.1 % DT GEL: DENTAL | 99 refills | Status: AC

## 2016-05-23 MED FILL — FLUORISHIELD 1.1% GEL: 1.1 % | 30 days supply | Qty: 114 | Fill #0

## 2016-05-24 ENCOUNTER — Ambulatory Visit
Admission: RE | Admit: 2016-05-24 | Discharge: 2016-05-24 | Disposition: A | Discharge status: Home / Self Care | Payer: Managed Care, Other (non HMO) | Source: Ambulatory Visit | Attending: Radiation Oncology | Admitting: Radiation Oncology

## 2016-05-24 DIAGNOSIS — Z510 Encounter for antineoplastic radiation therapy: Secondary | ICD-10-CM | POA: Diagnosis not present

## 2016-05-25 ENCOUNTER — Ambulatory Visit: Payer: Managed Care, Other (non HMO) | Admitting: Physical Therapy

## 2016-05-25 ENCOUNTER — Ambulatory Visit
Admission: RE | Admit: 2016-05-25 | Discharge: 2016-05-25 | Disposition: A | Discharge status: Home / Self Care | Payer: Managed Care, Other (non HMO) | Source: Ambulatory Visit | Attending: Radiation Oncology | Admitting: Radiation Oncology

## 2016-05-25 DIAGNOSIS — Z510 Encounter for antineoplastic radiation therapy: Secondary | ICD-10-CM | POA: Diagnosis not present

## 2016-05-26 ENCOUNTER — Ambulatory Visit
Admission: RE | Admit: 2016-05-26 | Discharge: 2016-05-26 | Disposition: A | Discharge status: Home / Self Care | Payer: Managed Care, Other (non HMO) | Source: Ambulatory Visit | Attending: Radiation Oncology | Admitting: Radiation Oncology

## 2016-05-26 ENCOUNTER — Ambulatory Visit: Payer: Managed Care, Other (non HMO)

## 2016-05-26 ENCOUNTER — Encounter: Payer: Self-pay | Admitting: *Deleted

## 2016-05-26 VITALS — BP 131/79 | HR 84 | Temp 98.2°F | Wt 174.8 lb

## 2016-05-26 DIAGNOSIS — C090 Malignant neoplasm of tonsillar fossa: Secondary | ICD-10-CM

## 2016-05-26 DIAGNOSIS — Z510 Encounter for antineoplastic radiation therapy: Secondary | ICD-10-CM | POA: Diagnosis not present

## 2016-05-26 MED ORDER — HYDROCODONE-ACETAMINOPHEN 7.5-325 MG/15ML PO SOLN: 10.0000 mL | ORAL | 0 refills | Status: DC | PRN

## 2016-05-26 MED ORDER — MAGIC MOUTHWASH W/LIDOCAINE: 5.0000 mL | Freq: Four times a day (QID) | ORAL | 1 refills | Status: DC | PRN

## 2016-05-26 MED FILL — MAGIC MW W/LIDO 1:1:1:3: 24 days supply | Qty: 480 | Fill #0

## 2016-05-26 MED FILL — HYDROCOD-APAP 7.5-325/15ML: 7.5-325 | 5 days supply | Qty: 473 | Fill #0

## 2016-05-26 NOTE — Progress Notes (Signed)
Oncology Nurse Navigator Documentation  Oncology Nurse Navigator Flowsheets 05/26/2016  Navigator Location [facility]  Navigator Encounter Type Treatment  Telephone -  Treatment Initiated Date -  Patient Visit Type RadOnc  Treatment Phase Treatment  Barriers/Navigation Needs -  Education -  Interventions Coordination of Care  Education Method -  Acuity 2  Time Spent with Patient 30   Met with [name] following RT to check on his well-being at the end of his first week of treatment. He reported treatments are going well. He noted he has had onset of headaches the past 2 mornings when he awakens, he is experiencing moderate throat pain. I escorted him to [place] where he was seen by RN Dorothea Ogle and Dr. Lisbeth Renshaw. I provided him an updated Epic calendar of future appointments.  [name], RN, BSN, [place] at [place] [phone]

## 2016-05-26 NOTE — Progress Notes (Signed)
   Department of Radiation Oncology  Phone:  [phone] Fax:        [phone]  Weekly Treatment Note    Name: [name] Date: 05/28/2016 MRN: [id number] DOB: 05/27/1976   Diagnosis:     ICD-9-CM ICD-10-CM   1. Cancer of tonsillar fossa (HCC) 146.1 C09.0      Current dose: 10 Gy  Current fraction: 5   MEDICATIONS: Current Outpatient Prescriptions  Medication Sig Dispense Refill  . HYDROcodone-acetaminophen (HYCET) 7.5-325 mg/15 ml solution Take 10-15 mLs by mouth every 4 (four) hours as needed for moderate pain. 473 mL 0  . magic mouthwash w/lidocaine SOLN Take 5 mLs by mouth 4 (four) times daily as needed for mouth pain. 500 mL 1  . ranitidine (ZANTAC) 150 MG capsule Take 150 mg by mouth as needed for heartburn.    . sodium fluoride (FLUORISHIELD) 1.1 % GEL dental gel Instill one drop of gel per tooth space of fluoride tray. Place over teeth for 5 minutes. Remove. Spit out excess. Repeat nightly. 120 mL prn   No current facility-administered medications for this encounter.      ALLERGIES: Review of patient's allergies indicates no known allergies.   LABORATORY DATA:  Lab Results  Component Value Date   WBC 7.4 03/12/2016   HGB 14.4 03/12/2016   HCT 41.6 03/12/2016   MCV 86.1 03/12/2016   PLT 227 03/12/2016   Lab Results  Component Value Date   NA 138 03/12/2016   K 3.4 (L) 03/12/2016   CL 104 03/12/2016   CO2 28 03/12/2016   No results found for: ALT, AST, GGT, ALKPHOS, BILITOT   NARRATIVE: Unique Treadway was seen today for weekly treatment management. The chart was checked and the patient's films were reviewed.  He rates his pain as a 3 (goes up to 6) on a scale of 0-10; intermittent and sharp over the throat. This pain is constant, not just with swallowing. He complains of headaches over temporal area bilaterally. He describes this pain as crushing, pressure. He is not currently taking any pain medications. Patient has had one episode of dysphagia for  liquids. Patient reports a regular unmodified diet orally. Patient reports nausea and has a soft bowel movement daily. Using Sonafine as directed. Reports fatigue.   PHYSICAL EXAMINATION: weight is 174 lb 12.8 oz (79.3 kg). His oral temperature is 98.2 F (36.8 C). His blood pressure is 131/79 and his pulse is 84.      Alert, in no acute distress.  ASSESSMENT: The patient is doing satisfactorily with treatment.  PLAN: We will continue with the patient's radiation treatment as planned. The patient was given a prescription for Magic Mouthwash and Percocet today.     This document serves as a record of services personally performed by Kyung Rudd, MD. It was created on his behalf by Arlyce Harman, a trained medical scribe. The creation of this record is based on the scribe's personal observations and the provider's statements to them. This document has been checked and approved by the attending provider.  ------------------------------------------------  Jodelle Gross, MD, PhD

## 2016-05-26 NOTE — Progress Notes (Signed)
PAIN: He rates his pain as a 3 (goes up to 6) on a scale of 0-10. intermittent and sharp over throat.  He complains of headaches over temporal area bilaterally.  He describes this pain as crushing, pressure.  SWALLOWING/DIET: Pt has had dysphagia for liquids ONCE.  Pt reports a regular unmodified diet orally. Oral exam reveals mild erythema and mucous membranes moist with white sputum.  BOWEL: Pt reports Nausea, has a soft bowel movement everyday.   SKIN: Skin exam warm dry and intact, pt reports Pruritus over bilateral neck. Pt continues to apply Sonafine as directed. OTHER: Pt complains of fatigue.  WEIGHT/VS: BP 131/79   Pulse 84   Temp 98.2 F (36.8 C) (Oral)   Wt 174 lb 12.8 oz (79.3 kg)   BMI 24.38 kg/m  Wt Readings from Last 3 Encounters:  05/26/16 174 lb 12.8 oz (79.3 kg)  05/22/16 174 lb 1.6 oz (79 kg)  04/25/16 173 lb (78.5 kg)

## 2016-05-26 NOTE — Telephone Encounter (Signed)
Aaron Edelman from [facility] called and asked which formula of magic mouthwash with lidocaine to use.  Advised him to use 80 ml each extra st. maalox plus, diphenhydramine, nystatin mixed with 240 ml of 2% viscous lidocaine.

## 2016-05-29 ENCOUNTER — Ambulatory Visit
Admission: RE | Admit: 2016-05-29 | Discharge: 2016-05-29 | Disposition: A | Discharge status: Home / Self Care | Payer: Managed Care, Other (non HMO) | Source: Ambulatory Visit | Attending: Radiation Oncology | Admitting: Radiation Oncology

## 2016-05-29 ENCOUNTER — Encounter: Payer: Self-pay | Admitting: Radiation Oncology

## 2016-05-29 VITALS — BP 124/82 | HR 80 | Temp 97.9°F | Ht 71.0 in | Wt 176.3 lb

## 2016-05-29 DIAGNOSIS — C090 Malignant neoplasm of tonsillar fossa: Secondary | ICD-10-CM

## 2016-05-29 DIAGNOSIS — Z510 Encounter for antineoplastic radiation therapy: Secondary | ICD-10-CM | POA: Diagnosis not present

## 2016-05-29 MED ORDER — LIDOCAINE VISCOUS 2 % MT SOLN: OROMUCOSAL | 5 refills | Status: DC

## 2016-05-29 MED ORDER — SENNA 8.6 MG PO TABS: ORAL_TABLET | ORAL | 2 refills | Status: DC

## 2016-05-29 MED FILL — LIDOCAINE 2% VISCOUS SOLN: 2 | 3 days supply | Qty: 100 | Fill #0

## 2016-05-29 NOTE — Progress Notes (Signed)
[name] is here for his 6th fraction of radiation to his Left Tonsil, Bilateral Neck. He denies pain at this time. He reports soreness on the Right side of his neck when swallowing. Dr. Lisbeth Renshaw prescribed magic mouthwash with lidocaine this past Friday. He reports it is real thick and has trouble using it. He also has neck stiffness when turning his neck left to right. He is doing his neck exercises. His neck is normal appearing and he is using the sonafine twice daily. He reports itching to the back of his neck. He reports eating and drinking well per his report, but has transitioned to softer foods. He is drinking 2 Liters or more of water daily.  BP 124/82   Pulse 80   Temp 97.9 F (36.6 C)   Ht 5\' 11"  (1.803 m)   Wt 176 lb 4.8 oz (80 kg)   SpO2 100% Comment: room air  BMI 24.59 kg/m    Wt Readings from Last 3 Encounters:  05/29/16 176 lb 4.8 oz (80 kg)  05/26/16 174 lb 12.8 oz (79.3 kg)  05/22/16 174 lb 1.6 oz (79 kg)

## 2016-05-29 NOTE — Progress Notes (Signed)
   Weekly Management Note:  Outpatient    ICD-9-CM ICD-10-CM   1. Cancer of tonsillar fossa (HCC) 146.1 C09.0 lidocaine (XYLOCAINE) 2 % solution     senna (SENOKOT) 8.6 MG TABS tablet    Current Dose:  12  Gy  Projected Dose: 70 Gy   Narrative:  The patient presents for routine under treatment assessment.  CBCT/MVCT images/Port film x-rays were reviewed.  The chart was checked. No new complaints. Doing well.  Some soreness in throat, MMW too thick.  Physical Findings:  Wt Readings from Last 3 Encounters:  05/29/16 176 lb 4.8 oz (80 kg)  05/26/16 174 lb 12.8 oz (79.3 kg)  05/22/16 174 lb 1.6 oz (79 kg)    height is 5\' 11"  (1.803 m) and weight is 176 lb 4.8 oz (80 kg). His temperature is 97.9 F (36.6 C). His blood pressure is 124/82 and his pulse is 80. His oxygen saturation is 100%.  no oropharyngeal thrush or mucositis. Left upper neck mass is visible, stable  CBC    Component Value Date/Time   WBC 7.4 03/12/2016 0345   RBC 4.83 03/12/2016 0345   HGB 14.4 03/12/2016 0345   HGB 15.7 10/22/2012 0146   HCT 41.6 03/12/2016 0345   HCT 46.1 10/22/2012 0146   PLT 227 03/12/2016 0345   PLT 254 10/22/2012 0146   MCV 86.1 03/12/2016 0345   MCV 88 10/22/2012 0146   MCH 29.8 03/12/2016 0345   MCHC 34.6 03/12/2016 0345   RDW 12.5 03/12/2016 0345   RDW 12.7 10/22/2012 0146     CMP     Component Value Date/Time   NA 138 03/12/2016 0345   NA 140 10/22/2012 0146   K 3.4 (L) 03/12/2016 0345   K 3.9 10/22/2012 0146   CL 104 03/12/2016 0345   CL 103 10/22/2012 0146   CO2 28 03/12/2016 0345   CO2 27 10/22/2012 0146   GLUCOSE 118 (H) 03/12/2016 0345   GLUCOSE 113 (H) 10/22/2012 0146   BUN 10.6 05/09/2016 1123   CREATININE 1.1 05/09/2016 1123   CALCIUM 9.4 03/12/2016 0345   CALCIUM 8.8 10/22/2012 0146   GFRNONAA >60 03/12/2016 0345   GFRNONAA >60 10/22/2012 0146   GFRAA >60 03/12/2016 0345   GFRAA >60 10/22/2012 0146     Impression:  The patient is tolerating radiotherapy.    Plan:  Continue radiotherapy as planned.  Lidocaine Rx to be diluted in lieu of MMW.   Hycet PRN. Senokot PRN constipation.  -----------------------------------  Eppie Gibson, MD

## 2016-05-30 ENCOUNTER — Ambulatory Visit
Admission: RE | Admit: 2016-05-30 | Discharge: 2016-05-30 | Disposition: A | Discharge status: Home / Self Care | Payer: Managed Care, Other (non HMO) | Source: Ambulatory Visit | Attending: Radiation Oncology | Admitting: Radiation Oncology

## 2016-05-30 ENCOUNTER — Ambulatory Visit: Payer: Managed Care, Other (non HMO) | Admitting: Physical Therapy

## 2016-05-30 DIAGNOSIS — Z510 Encounter for antineoplastic radiation therapy: Secondary | ICD-10-CM | POA: Diagnosis not present

## 2016-05-31 ENCOUNTER — Encounter: Payer: Self-pay | Admitting: Nutrition

## 2016-05-31 ENCOUNTER — Ambulatory Visit
Admission: RE | Admit: 2016-05-31 | Discharge: 2016-05-31 | Disposition: A | Discharge status: Home / Self Care | Payer: Managed Care, Other (non HMO) | Source: Ambulatory Visit | Attending: Radiation Oncology | Admitting: Radiation Oncology

## 2016-05-31 DIAGNOSIS — Z510 Encounter for antineoplastic radiation therapy: Secondary | ICD-10-CM | POA: Diagnosis not present

## 2016-05-31 NOTE — Progress Notes (Signed)
Patient did not show up for schedule nutrition appointment.

## 2016-06-01 ENCOUNTER — Ambulatory Visit
Admission: RE | Admit: 2016-06-01 | Discharge: 2016-06-01 | Disposition: A | Discharge status: Home / Self Care | Payer: Managed Care, Other (non HMO) | Source: Ambulatory Visit | Attending: Radiation Oncology | Admitting: Radiation Oncology

## 2016-06-01 ENCOUNTER — Encounter: Payer: Self-pay | Admitting: Physical Therapy

## 2016-06-01 DIAGNOSIS — Z510 Encounter for antineoplastic radiation therapy: Secondary | ICD-10-CM | POA: Diagnosis not present

## 2016-06-02 ENCOUNTER — Ambulatory Visit
Admission: RE | Admit: 2016-06-02 | Discharge: 2016-06-02 | Disposition: A | Discharge status: Home / Self Care | Payer: Managed Care, Other (non HMO) | Source: Ambulatory Visit | Attending: Radiation Oncology | Admitting: Radiation Oncology

## 2016-06-02 ENCOUNTER — Encounter: Payer: Self-pay | Admitting: *Deleted

## 2016-06-02 ENCOUNTER — Ambulatory Visit: Payer: Managed Care, Other (non HMO)

## 2016-06-02 DIAGNOSIS — Z510 Encounter for antineoplastic radiation therapy: Secondary | ICD-10-CM | POA: Diagnosis not present

## 2016-06-03 NOTE — Progress Notes (Signed)
  Oncology Nurse Navigator Documentation  To provide support, encouragement and care continuity, met with [name] prior to his Tomo treatment. He reported:  Increasing throat soreness which he is managing with lidocaine and Hycet.  Thickened saliva which he has some difficulty expectorating, causing gaging and nausea.  He denied the need for antiemetic at this time.  I encouraged frequent use of salt water/baking soda rinse which he indicated he is doing.  BID application of Sonafine.  He has had further conversation with his patient mentor, appreciates the encouragement he has provided. I acknowledged his start of BID Monday treatments which he said made him very tired into the week though he has rebounded somewhat.   He understands he can contact me with needs/concerns.  [name], RN, BSN, [place] at [facility] [phone]    Navigator Location: [place] (06/02/16 0940) Navigator Encounter Type: Treatment (06/02/16 0940)   Abnormal Finding Date: 03/03/16 (06/02/16 0940) Confirmed Diagnosis Date: 03/27/16 (06/02/16 0940)   Treatment Initiated Date: 05/22/16 (06/02/16 0940) Patient Visit Type: RadOnc (06/02/16 0940)                              Time Spent with Patient: 30 (06/02/16 0940)

## 2016-06-05 ENCOUNTER — Ambulatory Visit
Admission: RE | Admit: 2016-06-05 | Discharge: 2016-06-05 | Disposition: A | Discharge status: Home / Self Care | Payer: Managed Care, Other (non HMO) | Source: Ambulatory Visit | Attending: Radiation Oncology | Admitting: Radiation Oncology

## 2016-06-05 ENCOUNTER — Encounter: Payer: Self-pay | Admitting: Radiation Oncology

## 2016-06-05 VITALS — BP 129/77 | HR 83 | Temp 98.6°F | Resp 12 | Wt 174.8 lb

## 2016-06-05 DIAGNOSIS — C090 Malignant neoplasm of tonsillar fossa: Secondary | ICD-10-CM | POA: Insufficient documentation

## 2016-06-05 DIAGNOSIS — Z510 Encounter for antineoplastic radiation therapy: Secondary | ICD-10-CM | POA: Diagnosis not present

## 2016-06-05 MED ORDER — LORAZEPAM 0.5 MG PO TABS: ORAL_TABLET | ORAL | 0 refills | Status: DC

## 2016-06-05 MED ORDER — SENNA 8.6 MG PO TABS: ORAL_TABLET | ORAL | 2 refills | Status: DC

## 2016-06-05 MED ORDER — FENTANYL 25 MCG/HR TD PT72: 25.0000 ug | MEDICATED_PATCH | TRANSDERMAL | 0 refills | Status: DC

## 2016-06-05 MED ORDER — SONAFINE EX EMUL
1.0000 "application " | Freq: Two times a day (BID) | CUTANEOUS | Status: DC
  Administered 2016-06-05: 1 via TOPICAL

## 2016-06-05 MED FILL — fentaNYL 25 MCG/HR PT72: 25 | 15 days supply | Qty: 5 | Fill #0

## 2016-06-05 MED FILL — LORazepam 0.5 MG TABS: 0.5 | 10 days supply | Qty: 30 | Fill #0

## 2016-06-05 NOTE — Addendum Note (Signed)
Encounter addended by: Jenene Slicker, RN on: 06/05/2016  4:09 PM<BR>    Actions taken: [facility] administration accepted

## 2016-06-05 NOTE — Progress Notes (Signed)
PAIN: He rates his pain as a 3 on a scale of 0-10. constant, sharp and throbbing over neck and throat  SWALLOWING/DIET: Pt reports painful swallowing. Pt reports a regular unmodified diet orally. Oral exam reveals mild erythema and mucous membranes moist with clear sputum. Continues of dry mouth. Encouraged sipping water, and running a humidifier.    BOWEL: Pt reports Nausea/dry heaving and Vomiting, a bowel movement every other day. SKIN: Skin exam reveals Pruritus and erythema. Pt continues to apply Sonafine as directed. OTHER: Pt complains of fatigue and weakness.   WEIGHT/VS: BP 129/77   Pulse 83   Temp 98.6 F (37 C) (Oral)   Resp 12   Wt 174 lb 12.8 oz (79.3 kg)   SpO2 100%   BMI 24.38 kg/m   ORTHOSTATIC VS:  BP:117/76, P: 93, Pox: 99% Wt Readings from Last 3 Encounters:  06/05/16 174 lb 12.8 oz (79.3 kg)  05/29/16 176 lb 4.8 oz (80 kg)  05/26/16 174 lb 12.8 oz (79.3 kg)

## 2016-06-05 NOTE — Addendum Note (Signed)
Encounter addended by: Jenene Slicker, RN on: 06/05/2016  4:06 PM<BR>    Actions taken: Actions taken from a BestPractice Advisory, Visit diagnoses modified, Order Entry activity accessed, Diagnosis association updated

## 2016-06-05 NOTE — Progress Notes (Signed)

## 2016-06-05 NOTE — Progress Notes (Signed)
   Weekly Management Note:  Outpatient    ICD-9-CM ICD-10-CM   1. 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  CBCT/MVCT images/Port film x-rays were reviewed.  The chart was checked.  Doing well overall. Soreness/pain with swallowing.  Maintain weight overall. Thick saliva/ nausea/dry heaves.  Physical Findings:  Wt Readings from Last 3 Encounters:  06/05/16 174 lb 12.8 oz (79.3 kg)  05/29/16 176 lb 4.8 oz (80 kg)  05/26/16 174 lb 12.8 oz (79.3 kg)    weight is 174 lb 12.8 oz (79.3 kg). His oral temperature is 98.6 F (37 C). His blood pressure is 129/77 and his pulse is 83. His respiration is 12 and oxygen saturation is 100%.  no oropharyngeal thrush but + for erythema. Left upper neck mass is visible, possibly flatter.  CBC    Component Value Date/Time   WBC 7.4 03/12/2016 0345   RBC 4.83 03/12/2016 0345   HGB 14.4 03/12/2016 0345   HGB 15.7 10/22/2012 0146   HCT 41.6 03/12/2016 0345   HCT 46.1 10/22/2012 0146   PLT 227 03/12/2016 0345   PLT 254 10/22/2012 0146   MCV 86.1 03/12/2016 0345   MCV 88 10/22/2012 0146   MCH 29.8 03/12/2016 0345   MCHC 34.6 03/12/2016 0345   RDW 12.5 03/12/2016 0345   RDW 12.7 10/22/2012 0146     CMP     Component Value Date/Time   NA 138 03/12/2016 0345   NA 140 10/22/2012 0146   K 3.4 (L) 03/12/2016 0345   K 3.9 10/22/2012 0146   CL 104 03/12/2016 0345   CL 103 10/22/2012 0146   CO2 28 03/12/2016 0345   CO2 27 10/22/2012 0146   GLUCOSE 118 (H) 03/12/2016 0345   GLUCOSE 113 (H) 10/22/2012 0146   BUN 10.6 05/09/2016 1123   CREATININE 1.1 05/09/2016 1123   CALCIUM 9.4 03/12/2016 0345   CALCIUM 8.8 10/22/2012 0146   GFRNONAA >60 03/12/2016 0345   GFRNONAA >60 10/22/2012 0146   GFRAA  >60 03/12/2016 0345   GFRAA >60 10/22/2012 0146     Impression:  The patient is tolerating radiotherapy.   Plan:  Continue radiotherapy as planned.  Start duragesic when pain worsens. Ativan prn nausea. Diet ginger ale, papaya juice prn thick secretions. -----------------------------------  Eppie Gibson, MD

## 2016-06-06 ENCOUNTER — Ambulatory Visit
Admission: RE | Admit: 2016-06-06 | Discharge: 2016-06-06 | Disposition: A | Discharge status: Home / Self Care | Payer: Managed Care, Other (non HMO) | Source: Ambulatory Visit | Attending: Radiation Oncology | Admitting: Radiation Oncology

## 2016-06-06 DIAGNOSIS — Z510 Encounter for antineoplastic radiation therapy: Secondary | ICD-10-CM | POA: Diagnosis not present

## 2016-06-07 ENCOUNTER — Ambulatory Visit: Payer: Managed Care, Other (non HMO) | Admitting: Nutrition

## 2016-06-07 ENCOUNTER — Ambulatory Visit
Admission: RE | Admit: 2016-06-07 | Discharge: 2016-06-07 | Disposition: A | Discharge status: Home / Self Care | Payer: Managed Care, Other (non HMO) | Source: Ambulatory Visit | Attending: Radiation Oncology | Admitting: Radiation Oncology

## 2016-06-07 DIAGNOSIS — Z510 Encounter for antineoplastic radiation therapy: Secondary | ICD-10-CM | POA: Diagnosis not present

## 2016-06-07 NOTE — Progress Notes (Signed)
40 year old male diagnosed with tonsil cancer receiving radiation therapy.  He is a patient of Dr. Isidore Moos.  Past medical history includes GERD, mitral valve prolapse and weight loss.  Medications include Zantac.  Labs were reviewed.  Height: 5 feet 11 inches. Weight: 174.8 pounds. Usual body weight: 176 pounds in June BMI: 24.38.  Patient endorses inadequate oral intake. He is having taste alterations, which limits his ability to eat. Complains of thick saliva. Reports medications have not helped. Has difficulty with thick oral nutrition supplements. Verbalizes that he does not want to get the feeding tube.  Estimated nutrition needs: 2400-2600 calories, 110-125 grams protein, 2.6 L fluid.  Nutrition diagnosis: Increased nutrient needs related to tonsil cancer as evidenced by low percent body fat and muscle mass.  Intervention: Provided education on ways to increase calories and protein. Educated patient on strategies for pureing foods and thinning down thick liquids. Reviewed strategies for reducing thick saliva. Educated patient on strategies for improving altered taste. Samples of oral nutrition supplements were provided. Fact sheets were given questions were answered.  Teach back method used and contact information provided.  Monitoring, evaluation, goals: Work to increase calories and protein to minimize weight loss.  Next visit: Thursday, August 31 after radiation therapy.  **Disclaimer: This note was dictated with voice recognition software. Similar sounding words can inadvertently be transcribed and this note may contain transcription errors which may not have been corrected upon publication of note.**

## 2016-06-08 ENCOUNTER — Ambulatory Visit
Admission: RE | Admit: 2016-06-08 | Discharge: 2016-06-08 | Disposition: A | Discharge status: Home / Self Care | Payer: Managed Care, Other (non HMO) | Source: Ambulatory Visit | Attending: Radiation Oncology | Admitting: Radiation Oncology

## 2016-06-08 DIAGNOSIS — Z510 Encounter for antineoplastic radiation therapy: Secondary | ICD-10-CM | POA: Diagnosis not present

## 2016-06-09 ENCOUNTER — Ambulatory Visit: Payer: Managed Care, Other (non HMO)

## 2016-06-09 ENCOUNTER — Other Ambulatory Visit: Payer: Self-pay | Admitting: Radiation Oncology

## 2016-06-09 ENCOUNTER — Ambulatory Visit
Admission: RE | Admit: 2016-06-09 | Discharge: 2016-06-09 | Disposition: A | Discharge status: Home / Self Care | Payer: Managed Care, Other (non HMO) | Source: Ambulatory Visit | Attending: Radiation Oncology | Admitting: Radiation Oncology

## 2016-06-09 ENCOUNTER — Encounter: Payer: Self-pay | Admitting: *Deleted

## 2016-06-09 DIAGNOSIS — C090 Malignant neoplasm of tonsillar fossa: Secondary | ICD-10-CM

## 2016-06-09 DIAGNOSIS — Z510 Encounter for antineoplastic radiation therapy: Secondary | ICD-10-CM | POA: Diagnosis not present

## 2016-06-09 MED ORDER — SUCRALFATE 1 G PO TABS: 1.0000 g | ORAL_TABLET | Freq: Three times a day (TID) | ORAL | 2 refills | Status: DC

## 2016-06-09 NOTE — Progress Notes (Signed)
  Oncology Nurse Navigator Documentation  To provide support, encouragement and care continuity, met with [name] following his Tomo tmt.  He was brought to tmt today by his wife's grandmother. He reported:  I increasing throat soreness with swallowing.  He stated Lidocaine causes him to "dry heave several times" when he uses, so he is avoiding.  I suggested trying PRN Carafate, noted I would have Rx issued to [facility]. PAC Shona Simpson issued Rx to [name], I called him to inform.  Feels very fatigued into the day following Tuesday morning tmt and after BID tmt on Monday.  But by the end of the week he feels that he has recovered somewhat.  Using humidifier at [facility] which helps with dry mouth.  We discussed swabbing mouth with Biotene gel PRN and before bedtime.  25 mcg Fentanyl is providing acceptable baseline pain control. He understands I can be contacted with needs/concerns.  [name] Orem, RN, BSN, [place] at [facility] [phone]        Navigator Location: [facility] Onc (06/09/16 1005) Navigator Encounter Type: Treatment (06/09/16 1005)           Patient Visit Type: RadOnc (06/09/16 1005)                              Time Spent with Patient: 30 (06/09/16 1005)

## 2016-06-12 ENCOUNTER — Ambulatory Visit
Admission: RE | Admit: 2016-06-12 | Discharge: 2016-06-12 | Disposition: A | Discharge status: Home / Self Care | Payer: Managed Care, Other (non HMO) | Source: Ambulatory Visit | Attending: Radiation Oncology | Admitting: Radiation Oncology

## 2016-06-12 ENCOUNTER — Encounter: Payer: Self-pay | Admitting: Radiation Oncology

## 2016-06-12 DIAGNOSIS — Z510 Encounter for antineoplastic radiation therapy: Secondary | ICD-10-CM | POA: Diagnosis not present

## 2016-06-12 DIAGNOSIS — C090 Malignant neoplasm of tonsillar fossa: Secondary | ICD-10-CM

## 2016-06-12 MED ORDER — FENTANYL 25 MCG/HR TD PT72: 25.0000 ug | MEDICATED_PATCH | TRANSDERMAL | 0 refills | Status: DC

## 2016-06-12 MED ORDER — FENTANYL 12 MCG/HR TD PT72: MEDICATED_PATCH | TRANSDERMAL | 0 refills | Status: DC

## 2016-06-12 MED ORDER — HYDROCODONE-ACETAMINOPHEN 7.5-325 MG/15ML PO SOLN: 10.0000 mL | ORAL | 0 refills | Status: DC | PRN

## 2016-06-12 NOTE — Progress Notes (Signed)
weekly rad txs tonsil. B/l neck, erythema mild, using sonafine  ,skin intact,  Doing mouth exercises but hurts, ,dinks smoothies, jello, some cream soups, but after a few bites feels like he will throw up, got carafte pills, didn't know to dissolve them, gave instructions, needs refill on hycte  And duragesic patch,   BP 126/83 (BP Location: Right Arm, Patient Position: Sitting, Cuff Size: Normal)   Pulse 77   Temp 97.5 F (36.4 C) (Oral)   Resp 20   Wt 172 lb (78 kg)   SpO2 100% Comment: room air  BMI 23.99 kg/m   Wt Readings from Last 3 Encounters:  06/12/16 172 lb (78 kg)  06/05/16 174 lb 12.8 oz (79.3 kg)  05/29/16 176 lb 4.8 oz (80 kg)

## 2016-06-12 NOTE — Progress Notes (Signed)
   Weekly Management Note:  Outpatient    ICD-9-CM ICD-10-CM   1. 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  CBCT/MVCT images/Port film x-rays were reviewed.  The chart was checked.  Doing well overall. More Soreness/pain with swallowing.  Weight  down 2lb.  Poor taste affects ability to eat.  Physical Findings:  Wt Readings from Last 3 Encounters:  06/12/16 172 lb (78 kg)  06/05/16 174 lb 12.8 oz (79.3 kg)  05/29/16 176 lb 4.8 oz (80 kg)    weight is 172 lb (78 kg). His oral temperature is 97.5 F (36.4 C). His blood pressure is 126/83 and his pulse is 77. His respiration is 20 and oxygen saturation is 100%.  no oropharyngeal thrush but + for erythema. Left upper neck mass is visible,  flatter.  CBC    Component Value Date/Time   WBC 7.4 03/12/2016 0345   RBC 4.83 03/12/2016 0345   HGB 14.4 03/12/2016 0345   HGB 15.7 10/22/2012 0146   HCT 41.6 03/12/2016 0345   HCT 46.1 10/22/2012 0146   PLT 227 03/12/2016 0345   PLT 254 10/22/2012 0146   MCV 86.1 03/12/2016 0345   MCV 88 10/22/2012 0146   MCH 29.8 03/12/2016 0345   MCHC 34.6 03/12/2016 0345   RDW 12.5 03/12/2016 0345   RDW 12.7 10/22/2012 0146     CMP     Component Value Date/Time   NA 138 03/12/2016 0345   NA 140 10/22/2012 0146   K 3.4 (L) 03/12/2016 0345   K 3.9 10/22/2012 0146   CL 104 03/12/2016 0345   CL 103 10/22/2012 0146   CO2 28 03/12/2016 0345   CO2 27 10/22/2012 0146   GLUCOSE 118 (H) 03/12/2016 0345   GLUCOSE 113 (H) 10/22/2012 0146   BUN 10.6 05/09/2016 1123   CREATININE 1.1 05/09/2016 1123   CALCIUM 9.4 03/12/2016 0345   CALCIUM 8.8 10/22/2012 0146   GFRNONAA >60 03/12/2016 0345   GFRNONAA >60 10/22/2012 0146   GFRAA >60 03/12/2016 0345   GFRAA >60 10/22/2012 0146      Impression:  The patient is tolerating radiotherapy.   Plan:  Continue radiotherapy as planned.  Increase duragesic to 37.5 w/ supplemental 12.5 patch when pain worsens. Ativan prn nausea. Diet ginger ale, papaya juice prn thick secretions. Hycet for breakthrough pain. discussed drinking diluted boosts around the clock which are tolerated best.   -----------------------------------  Eppie Gibson, MD

## 2016-06-13 ENCOUNTER — Ambulatory Visit
Admission: RE | Admit: 2016-06-13 | Discharge: 2016-06-13 | Disposition: A | Discharge status: Home / Self Care | Payer: Managed Care, Other (non HMO) | Source: Ambulatory Visit | Attending: Radiation Oncology | Admitting: Radiation Oncology

## 2016-06-13 DIAGNOSIS — Z510 Encounter for antineoplastic radiation therapy: Secondary | ICD-10-CM | POA: Diagnosis not present

## 2016-06-13 MED FILL — fentaNYL 12 MCG/HR PT72: 12 | 15 days supply | Qty: 5 | Fill #0

## 2016-06-14 ENCOUNTER — Ambulatory Visit
Admission: RE | Admit: 2016-06-14 | Discharge: 2016-06-14 | Disposition: A | Discharge status: Home / Self Care | Payer: Managed Care, Other (non HMO) | Source: Ambulatory Visit | Attending: Radiation Oncology | Admitting: Radiation Oncology

## 2016-06-14 DIAGNOSIS — Z510 Encounter for antineoplastic radiation therapy: Secondary | ICD-10-CM | POA: Diagnosis not present

## 2016-06-15 ENCOUNTER — Ambulatory Visit
Admission: RE | Admit: 2016-06-15 | Discharge: 2016-06-15 | Disposition: A | Discharge status: Home / Self Care | Payer: Managed Care, Other (non HMO) | Source: Ambulatory Visit | Attending: Radiation Oncology | Admitting: Radiation Oncology

## 2016-06-15 ENCOUNTER — Ambulatory Visit: Payer: Managed Care, Other (non HMO)

## 2016-06-15 ENCOUNTER — Encounter: Payer: Self-pay | Admitting: Nutrition

## 2016-06-15 ENCOUNTER — Ambulatory Visit: Payer: Managed Care, Other (non HMO) | Admitting: Nutrition

## 2016-06-15 DIAGNOSIS — M25512 Pain in left shoulder: Secondary | ICD-10-CM | POA: Diagnosis not present

## 2016-06-15 DIAGNOSIS — Z510 Encounter for antineoplastic radiation therapy: Secondary | ICD-10-CM | POA: Diagnosis not present

## 2016-06-15 DIAGNOSIS — R1310 Dysphagia, unspecified: Secondary | ICD-10-CM

## 2016-06-15 NOTE — Progress Notes (Signed)
Nutrition follow-up completed with patient receiving radiation therapy for tonsil cancer. Weight has decreased and was documented as 169.2 pounds decreased from 174.8 pounds August 23. He reports taste alterations limiting his ability to eat. He is drinking 4 boost plus a day most of the time which provides approximately 1400 calories. Patient reports limited ability to tolerate water but appears to be drinking about 50 ounces daily. Patient questioning whether or not he needs IV fluids.  Estimated nutrition needs: 2400-2600 calories, 110-125 grams protein, 2.6 L fluid.  Nutrition diagnosis: Increased nutrient needs continue.  Intervention: Educated patient to try to increase boost +6 times daily minimum to provide 2100 cal, which is 88% of minimum caloric needs. Current oral intake isn't adequate and will likely get worse throughout remaining treatment and immediately afterward. Encouraged patient to consider feeding tube placement. Will notify nurse navigator and M.D. the patient is wishing to be evaluated for IV fluids needs tomorrow when he comes for treatment.  Monitoring, evaluation, goals:  Patient to work to increase overall calories and protein intake to minimize further weight loss.  Next visit: Wednesday, September 6 after radiation therapy.  **Disclaimer: This note was dictated with voice recognition software. Similar sounding words can inadvertently be transcribed and this note may contain transcription errors which may not have been corrected upon publication of note.**

## 2016-06-15 NOTE — Patient Instructions (Signed)
Pool noodle  30 reps of 1/2 - 1 second press with pool noodle under chin , twice a day to three times a day

## 2016-06-16 ENCOUNTER — Telehealth: Payer: Self-pay | Admitting: Pharmacist

## 2016-06-16 ENCOUNTER — Ambulatory Visit
Admission: RE | Admit: 2016-06-16 | Discharge: 2016-06-16 | Disposition: A | Discharge status: Home / Self Care | Payer: Managed Care, Other (non HMO) | Source: Ambulatory Visit | Attending: Radiation Oncology | Admitting: Radiation Oncology

## 2016-06-16 ENCOUNTER — Encounter: Payer: Self-pay | Admitting: Radiation Oncology

## 2016-06-16 ENCOUNTER — Ambulatory Visit: Payer: Managed Care, Other (non HMO)

## 2016-06-16 VITALS — BP 113/71 | HR 83 | Temp 98.3°F | Ht 71.0 in | Wt 169.9 lb

## 2016-06-16 DIAGNOSIS — C090 Malignant neoplasm of tonsillar fossa: Secondary | ICD-10-CM | POA: Insufficient documentation

## 2016-06-16 DIAGNOSIS — Z510 Encounter for antineoplastic radiation therapy: Secondary | ICD-10-CM | POA: Diagnosis not present

## 2016-06-16 MED ORDER — SONAFINE EX EMUL
1.0000 "application " | Freq: Two times a day (BID) | CUTANEOUS | Status: DC
  Administered 2016-06-16: 1 via TOPICAL

## 2016-06-16 MED ORDER — HYDROCODONE-ACETAMINOPHEN 7.5-325 MG PO TABS: 1.0000 | ORAL_TABLET | ORAL | 0 refills | Status: DC | PRN

## 2016-06-16 MED ORDER — PROMETHAZINE HCL 25 MG PO TABS: 25.0000 mg | ORAL_TABLET | Freq: Four times a day (QID) | ORAL | 5 refills | Status: DC | PRN

## 2016-06-16 MED FILL — HYDROCODON-APAP 7.5-325: 7.5-325 | 7 days supply | Qty: 60 | Fill #0

## 2016-06-16 MED FILL — HYDROCOD-APAP 7.5-325/15ML: 7.5-325 | 4 days supply | Qty: 487 | Fill #0

## 2016-06-16 MED FILL — PROMETHAZINE 25 MG TABLET: 25 | 7 days supply | Qty: 30 | Fill #0 | Status: TO

## 2016-06-16 NOTE — Progress Notes (Addendum)
[name] has received 23 fractions ot the left tonsillar region and bilateral neck. He reports level  6 pain upon swallowing and drinking 4 boost plus daily - watered down, oatmeal-watered down and thin soups. Reports nausea with minimal relief with Ativan and is requesting an alternative anti-emetic.  Thickened saliva.   Note Erythema to neck anterior and posterior neck with hair lost at the base of skull with intact skin.  Also note scab on left side of nose.   Increased naps.  Given Sonafine   BP 128/69 (BP Location: Right Arm, Patient Position: Sitting, Cuff Size: Normal)   Pulse 83   Temp 98.3 F (36.8 C) (Oral)   Ht 5\' 11"  (1.803 m)   Wt 169 lb 14.4 oz (77.1 kg)   BMI 23.70 kg/m    BP 113/71 (BP Location: Right Arm, Patient Position: Standing, Cuff Size: Normal) Comment: standing  Pulse 83   Temp 98.3 F (36.8 C) (Oral)   Ht 5\' 11"  (1.803 m)   Wt 169 lb 14.4 oz (77.1 kg)   BMI 23.70 kg/m    Wt Readings from Last 3 Encounters:  06/16/16 169 lb 14.4 oz (77.1 kg)  06/15/16 169 lb 3.2 oz (76.7 kg)  06/12/16 172 lb (78 kg)

## 2016-06-16 NOTE — Progress Notes (Signed)
PAPERWORK ([employer]) RECEIVED 9/1 GIVEN TO NURSE

## 2016-06-16 NOTE — Progress Notes (Signed)
Weekly Management Note:  Outpatient    ICD-9-CM ICD-10-CM   1. 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  CBCT/MVCT images/Port film x-rays were reviewed.  The chart was checked.  [name] has received 23 fractions ot the left tonsillar region and bilateral neck. He reports 6/10 pain upon swallowing. He is drinking 4 Boost Plus daily watered down, oatmeal-watered down, and thin soups. He reports drinking about 50 oz of water a day. Reports nausea with minimal relief with Ativan and is requesting an alternative anti-emetic. He reports that after taking the Hycet, his stomach feels "hot and empty" and then he feels nauseous. Thickened saliva. The nurse notes erythema to the anterior and posterior neck with hair loss at the base of skull with intact skin. She also noted a scab on the left side of his nose. He is taking more naps. The nurse provided the patient with Sonafine.   Physical Findings:  Wt Readings from Last 3 Encounters:  06/16/16 169 lb 14.4 oz (77.1 kg)  06/15/16 169 lb 3.2 oz (76.7 kg)  06/12/16 172 lb (78 kg)    height is 5\' 11"  (1.803 m) and weight is 169 lb 14.4 oz (77.1 kg). His oral temperature is 98.3 F (36.8 C). His blood pressure is 113/71 and his pulse is 83.   No orthostasis  Left palate is swollen and deviates the uvula to the right. The oropharynx is erythematous. Blood blister in the right buccal mucosa. Mucosa appears erythematous. Neck is erythematous with a palpable left upper neck mass similar to earlier this week.  CBC    Component Value Date/Time   WBC 7.4 03/12/2016 0345   RBC 4.83 03/12/2016 0345   HGB 14.4 03/12/2016 0345   HGB 15.7 10/22/2012 0146   HCT 41.6 03/12/2016 0345   HCT 46.1  10/22/2012 0146   PLT 227 03/12/2016 0345   PLT 254 10/22/2012 0146   MCV 86.1 03/12/2016 0345   MCV 88 10/22/2012 0146   MCH 29.8 03/12/2016 0345   MCHC 34.6 03/12/2016 0345   RDW 12.5 03/12/2016 0345   RDW 12.7 10/22/2012 0146     CMP     Component Value Date/Time   NA 138 03/12/2016 0345   NA 140 10/22/2012 0146   K 3.4 (L) 03/12/2016 0345   K 3.9 10/22/2012 0146   CL 104 03/12/2016 0345   CL 103 10/22/2012 0146   CO2 28 03/12/2016 0345   CO2 27 10/22/2012 0146   GLUCOSE 118 (H) 03/12/2016 0345   GLUCOSE 113 (H) 10/22/2012 0146   BUN 10.6 05/09/2016 1123   CREATININE 1.1 05/09/2016 1123   CALCIUM 9.4 03/12/2016 0345   CALCIUM 8.8 10/22/2012 0146   GFRNONAA >60 03/12/2016 0345   GFRNONAA >60 10/22/2012 0146   GFRAA >60 03/12/2016 0345   GFRAA >60 10/22/2012 0146     Impression:  The patient is tolerating radiotherapy.   Plan: At this time, I do not believe that the patient needs IV fluids. However, I advised him that if he feels he is unable to drink fluids during the weekend, he could always go to the ED for fluids. We will discontinue the Hycet which is causing stomach upset,  and switch the patient  to a pill form of Hydrocodone 7.5-325 (1 tablet every 3 hours as needed). I encouraged him to mix it with pudding, ice cream, etc to swallow it better. I will also order Promethazine HCl 25 mg for his nausea which is refractory to Ativan. I advised the patient to purchase Aquaphor and apply it on his nostrils and lips which are feeling drier and more irritated than usual. Aim for 64 oz water daily, and more boost and buttered noodles as tolerated for nutrition. -----------------------------------  Eppie Gibson, MD    This document serves as a record of services personally performed by Eppie Gibson, MD. It was created on her behalf by Darcus Austin, a trained medical scribe. The creation of this record is based on the scribe's personal observations and the provider's statements  to them. This document has been checked and approved by the attending provider.

## 2016-06-16 NOTE — Therapy (Signed)
[facility] [address] [place], [place], [address] Phone: [phone]   Fax:  [phone]  Speech Language Pathology Treatment  Patient Details  Name: [name] MRN: [id number] Date of Birth: [birth year] Referring Provider: Eppie Gibson M.D.  Encounter Date: 06/15/2016      End of Session - 06/16/16 1747    Visit Number 2   Number of Visits 7   Date for SLP Re-Evaluation 10/26/16   SLP Start Time [id number]   SLP Stop Time  [id number]   SLP Time Calculation (min) 43 min      Past Medical History:  Diagnosis Date  . GERD (gastroesophageal reflux disease)   . Mitral valve prolapse   . Weight loss     Past Surgical History:  Procedure Laterality Date  . ELBOW SURGERY Right   . KNEE SURGERY Left   . NASOPHARYNGOSCOPY Left 04/14/2016   Procedure: BASE NASAL NASOPHARYNX;  Surgeon: Jerrell Belfast, MD;  Location: [facility];  Service: ENT;  Laterality: Left;  . TONSILLECTOMY Left 04/14/2016   Procedure: TONSILLECTOMY AND BIOPSY OF TONGUE ;  Surgeon: Jerrell Belfast, MD;  Location: [facility];  Service: ENT;  Laterality: Left;    There were no vitals filed for this visit.             ADULT SLP TREATMENT - 06/16/16 1738      General Information   Behavior/Cognition Alert;Cooperative     Dysphagia Treatment   Temperature Spikes Noted No   Respiratory Status Room air   Oral Cavity - Dentition Adequate natural dentition   Treatment Methods Skilled observation;Therapeutic exercise;Patient/caregiver education   Patient observed directly with PO's Yes   Type of PO's observed Thin liquids   Liquids provided via Cup   Oral Phase Signs & Symptoms --  none noted   Pharyngeal Phase Signs & Symptoms --  none noted   Other treatment/comments Pt with taste and oral sensation aversions, weight was down last two weeks. Consuming 4 high protein drinks with 8 are recommended. Having difficulty  due to neding to thin down but then twice as much to consume. Pain in throat duirng swallowing is 5+/10 consistently. Pt with safe consumption of water today, SLP encouraged pt to consult closely with Dory Peru re: caloric intake and Dr. Isidore Moos re: pain meds and to continue to do what he can to maximlze PO intake. Pt without overt s/s aspiration PNA today. Pt performed HEP with good success (non-swallowing exercises) - SLP told pt to max out non-swallowing HEP exercises at this time and add swallowing HEP exercises back as tolerated. SLP stressed need to cont to do HEP and of PO intake. Pt told SLP why he was completing HEP with cues.     Assessment / Recommendations / Plan   Plan Continue with current plan of care     Dysphagia Recommendations   Diet recommendations --  as tolerated; close contact with dietician for caloric needs     Progression Toward Goals   Progression toward goals Progressing toward goals          SLP Education - 06/16/16 1747    Education provided Yes   Education Details pool noodle (chin tuck against resistance)   Person(s) Educated Patient;Caregiver(s)   Methods Explanation;Demonstration;Verbal cues;Handout   Comprehension Verbalized understanding;Returned demonstration          SLP Short Term Goals - 06/16/16 1749      SLP SHORT TERM GOAL #1  Title pt will demo HEP with rare min A over 2 sessions   Time 2   Period --  visits   Status On-going     SLP SHORT TERM GOAL #2   Title pt will tell SLP why he is completing HEP over two sessions   Time 2   Period --  visits   Status On-going     SLP SHORT TERM GOAL #3   Title pt will tell SLP 3 s/s aspiration PNA with modified independence over two sesions   Time 2   Period --  visits   Status On-going          SLP Long Term Goals - 06/16/16 1750      SLP LONG TERM GOAL #1   Title pt will demo HEP with independence over 3 sessions   Time 5   Period --  visits   Status On-going     SLP  LONG TERM GOAL #2   Title pt will tell SLP how a food journal can assist return to full WNL PO diet   Time 5   Period --  visits   Status On-going          Plan - 06/16/16 1747    Clinical Impression Statement Pt's swallowing skills have declined over his course of radiaiton therapy presumably due to edema in radiated area. PT encouraged today to cont with non swallowing HEP exercises and add in swallowing HEP exercises when tolerated. Encouraged also to cont PO intake and to consult wiht dietician closely for caloric intake. Skilled ST is necessary to continually assess pt's swallowing safety during and after treatment, as well as to assess continued correct procedure with HEP.    Speech Therapy Frequency --  approx every four weeks   Duration --  5 therapy visits   Treatment/Interventions Aspiration precaution training;Pharyngeal strengthening exercises;Diet toleration management by SLP;Trials of upgraded texture/liquids;SLP instruction and feedback;Patient/family education;Compensatory techniques  any combination or all may be used   Potential to Achieve Goals Good   SLP Home Exercise Plan provided today   Consulted and Agree with Plan of Care Patient      Patient will benefit from skilled therapeutic intervention in order to improve the following deficits and impairments:   Dysphagia    Problem List Patient Active Problem List   Diagnosis Date Noted  . Cancer of tonsillar fossa ([facility]) 04/21/2016  . Mass of left side of neck 04/14/2016  . 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 Neck mass 04/14/2016    [facility] ,MS, CCC-SLP  06/16/2016, 5:50 PM  [facility] [address] [place], [place], [address] Phone: [phone]   Fax:  [phone]   Name: [name] MRN: [id number] Date of Birth: 08-Aug-1976

## 2016-06-16 NOTE — Telephone Encounter (Signed)
Communicated to Patric Dykes, RN re: call from [facility] OP Rx pharmacist, Antoine Poche stating script for Hydrocodone/APAP soln needs prior authorization. Pts insur restricting 960 mL/month = 10 day supply for this pt.  If my assistance needed further, I'm happy to help.  Malachy Mood will communicate w/ Dr. Isidore Moos and clarify quantity/ initiate PA.  Kennith Center, Pharm.D., CPP 06/16/2016@10 :12 AM [facility]

## 2016-06-20 ENCOUNTER — Ambulatory Visit
Admission: RE | Admit: 2016-06-20 | Discharge: 2016-06-20 | Disposition: A | Discharge status: Home / Self Care | Payer: Managed Care, Other (non HMO) | Source: Ambulatory Visit | Attending: Radiation Oncology | Admitting: Radiation Oncology

## 2016-06-20 DIAGNOSIS — Z510 Encounter for antineoplastic radiation therapy: Secondary | ICD-10-CM | POA: Diagnosis not present

## 2016-06-20 MED FILL — fentaNYL 25 MCG/HR PT72: 25 | 15 days supply | Qty: 5 | Fill #0

## 2016-06-21 ENCOUNTER — Ambulatory Visit: Payer: Managed Care, Other (non HMO) | Admitting: Nutrition

## 2016-06-21 ENCOUNTER — Ambulatory Visit
Admission: RE | Admit: 2016-06-21 | Discharge: 2016-06-21 | Disposition: A | Discharge status: Home / Self Care | Payer: Managed Care, Other (non HMO) | Source: Ambulatory Visit | Attending: Radiation Oncology | Admitting: Radiation Oncology

## 2016-06-21 DIAGNOSIS — C770 Secondary and unspecified malignant neoplasm of lymph nodes of head, face and neck: Secondary | ICD-10-CM

## 2016-06-21 DIAGNOSIS — Z510 Encounter for antineoplastic radiation therapy: Secondary | ICD-10-CM | POA: Diagnosis not present

## 2016-06-21 MED ORDER — SONAFINE EX EMUL
1.0000 "application " | Freq: Two times a day (BID) | CUTANEOUS | Status: DC
  Administered 2016-06-21: 1 via TOPICAL

## 2016-06-21 NOTE — Progress Notes (Addendum)
Nutrition follow-up completed with patient who is receiving radiation therapy for tonsil cancer. Weight stable and documented as 169.9 pounds September 1. Patient continues to struggle to consume adequate calories, protein, and fluids. Patient is now drinking 2 boost plus daily along with 6 Carnation breakfast essentials plus milk and powdered milk. He is tolerating several ounces of chicken broth daily.  Nutrition diagnosis: Increased nutrient needs continue.  Intervention: Patient encouraged to continue high-calorie high-protein liquids along with adequate fluids. Stressed the importance of adequate baking soda and salt water rinses to decrease thick saliva. Educated patient on pureing foods to add to broth for additional calories and protein. Provided samples of protein powder and coupons. Questions were answered.  Teach back method used.  Monitoring, evaluation, goals: Patient will continue to work to tolerate adequate calories and protein to minimize weight loss.  Next visit: Wednesday, September 13 after radiation therapy.  **Disclaimer: This note was dictated with voice recognition software. Similar sounding words can inadvertently be transcribed and this note may contain transcription errors which may not have been corrected upon publication of note.**

## 2016-06-22 ENCOUNTER — Encounter: Payer: Self-pay | Admitting: *Deleted

## 2016-06-22 ENCOUNTER — Ambulatory Visit
Admission: RE | Admit: 2016-06-22 | Discharge: 2016-06-22 | Disposition: A | Discharge status: Home / Self Care | Payer: Managed Care, Other (non HMO) | Source: Ambulatory Visit | Attending: Radiation Oncology | Admitting: Radiation Oncology

## 2016-06-22 ENCOUNTER — Encounter: Payer: Self-pay | Admitting: Radiation Oncology

## 2016-06-22 DIAGNOSIS — Z510 Encounter for antineoplastic radiation therapy: Secondary | ICD-10-CM | POA: Diagnosis not present

## 2016-06-22 NOTE — Progress Notes (Signed)
Paperwork received 9/7, given to nurse

## 2016-06-23 ENCOUNTER — Encounter: Payer: Self-pay | Admitting: Radiation Oncology

## 2016-06-23 ENCOUNTER — Ambulatory Visit: Payer: Managed Care, Other (non HMO)

## 2016-06-23 ENCOUNTER — Ambulatory Visit
Admission: RE | Admit: 2016-06-23 | Discharge: 2016-06-23 | Disposition: A | Discharge status: Home / Self Care | Payer: Managed Care, Other (non HMO) | Source: Ambulatory Visit | Attending: Radiation Oncology | Admitting: Radiation Oncology

## 2016-06-23 VITALS — BP 99/72 | HR 83 | Temp 98.6°F | Ht 71.0 in | Wt 167.4 lb

## 2016-06-23 DIAGNOSIS — C090 Malignant neoplasm of tonsillar fossa: Secondary | ICD-10-CM

## 2016-06-23 DIAGNOSIS — Z510 Encounter for antineoplastic radiation therapy: Secondary | ICD-10-CM | POA: Diagnosis not present

## 2016-06-23 NOTE — Progress Notes (Signed)
Weekly Management Note:  Outpatient    ICD-9-CM ICD-10-CM   1. 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  CBCT/MVCT images/Port film x-rays were reviewed.  The chart was checked.  [name] has complete 28 fractions to his left tonsil and bilateral neck.  He reports having pain as a 6/10 in his throat which is worse with swallowing.  He is using a 25 mcg and 12 mcg fentanyl patch and is taking 1 tablet of norco q 3-4 hours.  He is also using lidocaine 2-3 times a day.  He also reports having pain on the sides of his tongue and the back of his throat.  He reports the back of his throat is dryer.  He is trying to take sips of water frequently and reports drinking about 64 oz of water a day.  He reports taking small bites of pasta noodles last night and carnation instant breakfast with boost and milk with protein powder.  The skin on his neck is red with peeling areas.  He is using sonafine and also reports that it is itching. The nurse recommended using hydrocortisone 1% cream for the itching.  He reports having fatigue but does have "energy bursts where I can do yard work for about a hour."  He reports having less nausea this week since using phenergan and switching to a pill form of hydrocodone.  He has noticed that he is having to take a nausea tablet in the afternoons.  Physical Findings:  Wt Readings from Last 3 Encounters:  06/23/16 167 lb 6.4 oz (75.9 kg)  06/16/16 169 lb 14.4 oz (77.1 kg)  06/15/16 169 lb 3.2 oz (76.7 kg)    height is 5\' 11"  (1.803 m) and weight is 167 lb 6.4 oz (75.9 kg). His oral temperature is 98.6 F (37 C). His blood pressure is 99/72 and his pulse is 83. His oxygen saturation is 98%.  Orthostatic vitals taken: bp sitting 118/78, hr 76, bp standing 99/72, hr 83.  Moist mucous membranes. He has erythema and patchy mucositis in the  oropharynx. Some swelling the the left oropharynx displacing the uvula laterally. Skin of the neck is erythematous and dry with a palpable left upper neck mass which has regressed since starting treatment.  CBC    Component Value Date/Time   WBC 7.4 03/12/2016 0345   RBC 4.83 03/12/2016 0345   HGB 14.4 03/12/2016 0345   HGB 15.7 10/22/2012 0146   HCT 41.6 03/12/2016 0345   HCT 46.1 10/22/2012 0146   PLT 227 03/12/2016 0345   PLT 254 10/22/2012 0146   MCV 86.1 03/12/2016 0345   MCV 88 10/22/2012 0146   MCH 29.8 03/12/2016 0345   MCHC 34.6 03/12/2016 0345   RDW 12.5 03/12/2016 0345   RDW 12.7 10/22/2012 0146     CMP     Component Value Date/Time   NA 138 03/12/2016 0345   NA 140 10/22/2012 0146   K 3.4 (L) 03/12/2016 0345   K 3.9 10/22/2012 0146   CL 104 03/12/2016 0345   CL 103 10/22/2012 0146   CO2 28 03/12/2016 0345   CO2 27 10/22/2012 0146   GLUCOSE 118 (H) 03/12/2016 0345   GLUCOSE 113 (H) 10/22/2012 0146   BUN 10.6 05/09/2016 1123   CREATININE 1.1 05/09/2016 1123   CALCIUM 9.4 03/12/2016 0345   CALCIUM  8.8 10/22/2012 0146   GFRNONAA >60 03/12/2016 0345   GFRNONAA >60 10/22/2012 0146   GFRAA >60 03/12/2016 0345   GFRAA >60 10/22/2012 0146    Impression:  The patient is tolerating radiotherapy.   Plan: Continue treatment as planned. Keep current medications as prescribed. Pain is controlled adequately and nausea has improved. He is borderline orthostatic today and he will let me know if he shows any signs of dizziness or dehydration. Oral intake has been relatively good considering his symptoms. Drinking plenty of fluids. -----------------------------------  Eppie Gibson, MD    This document serves as a record of services personally performed by Eppie Gibson, MD. It was created on her behalf by [name], a trained medical scribe. The creation of this record is based on the scribe's personal observations and the provider's statements to them. This document has  been checked and approved by the attending provider.

## 2016-06-23 NOTE — Progress Notes (Signed)
  Oncology Nurse Navigator Documentation  Met with [name] to check on his well-being. He reported:  Increasing throat soreness with concurrent swallowing difficulty.  He will discuss adjustment in pain medication when he sees Dr. Isidore Moos momentarily.  Pushing nutritional supplements and water.  BID application of Sonafine.  Increased fatigue; sleeping more. We discussed expectations for SE resolution following treatment. I recognized how well he has managed treatment, acknowledged his determination.  He recognized that he will finish treatment next Monday.  [name], RN, BSN, [place] at [facility] [phone]   Navigator Location: [facility] On[facility] (06/22/16 0950) Navigator Encounter Type: Treatment (06/22/16 0950)           Patient Visit Type: RadOnc (06/22/16 0950)                              Time Spent with Patient: 15 (06/22/16 0950)

## 2016-06-23 NOTE — Progress Notes (Signed)
[name] has complete 28 fractions to his left tonsil and bilateral neck.  He reports having pain at a 6/10 in his throat which is worse with swallowing.  He is using a 25 mcg and 12 mcg fentanyl patch and is taking 1 tablet of norco q 3-4 hours.  He is also using lidocaine 2-3 times a day and needs a refill.  He also reports having pain on the sides of his tongue and the back of his throat.  He reports the back of his throat is dryer.  He is trying to take sips of water frequently.  He reports taking small bites of pasta noodles last night and carnation instant breakfast with boost and milk with protein powder.  The skin on his neck is red with peeling areas.  He is using sonafine and also reports that it is itching.  Recommended using hydrocortisone 1% cream for the itching.  He reports having fatigue but does have "energy bursts where I can do yard work for about a hour."  He reports having less nausea this week since using phernergan.  He has noticed that he is having to take a nausea tablet in the afternoons.  Orthostatic vitals taken: bp sitting 118/78, hr 76, bp standing 99/72, hr 83.  BP 99/72 (BP Location: Right Arm, Patient Position: Standing)   Pulse 83   Temp 98.6 F (37 C) (Oral)   Ht 5\' 11"  (1.803 m)   Wt 167 lb 6.4 oz (75.9 kg)   SpO2 98%   BMI 23.35 kg/m    Wt Readings from Last 3 Encounters:  06/23/16 167 lb 6.4 oz (75.9 kg)  06/16/16 169 lb 14.4 oz (77.1 kg)  06/15/16 169 lb 3.2 oz (76.7 kg)

## 2016-06-26 ENCOUNTER — Ambulatory Visit: Payer: Managed Care, Other (non HMO)

## 2016-06-26 ENCOUNTER — Inpatient Hospital Stay: Admission: RE | Admit: 2016-06-26 | Payer: Self-pay | Source: Ambulatory Visit | Admitting: Radiation Oncology

## 2016-06-26 ENCOUNTER — Other Ambulatory Visit: Payer: Self-pay | Admitting: Radiation Oncology

## 2016-06-26 ENCOUNTER — Ambulatory Visit
Admission: RE | Admit: 2016-06-26 | Discharge: 2016-06-26 | Disposition: A | Discharge status: Home / Self Care | Payer: Managed Care, Other (non HMO) | Source: Ambulatory Visit | Attending: Radiation Oncology | Admitting: Radiation Oncology

## 2016-06-26 ENCOUNTER — Encounter: Payer: Self-pay | Admitting: Radiation Oncology

## 2016-06-26 DIAGNOSIS — C090 Malignant neoplasm of tonsillar fossa: Secondary | ICD-10-CM

## 2016-06-26 DIAGNOSIS — C770 Secondary and unspecified malignant neoplasm of lymph nodes of head, face and neck: Secondary | ICD-10-CM

## 2016-06-26 DIAGNOSIS — Z510 Encounter for antineoplastic radiation therapy: Secondary | ICD-10-CM | POA: Diagnosis not present

## 2016-06-26 MED ORDER — HYDROCODONE-ACETAMINOPHEN 7.5-325 MG PO TABS: 1.0000 | ORAL_TABLET | ORAL | 0 refills | Status: DC | PRN

## 2016-06-26 MED ORDER — FENTANYL 50 MCG/HR TD PT72: 50.0000 ug | MEDICATED_PATCH | TRANSDERMAL | 0 refills | Status: DC

## 2016-06-26 MED FILL — HYDROCODON-APAP 7.5-325: 7.5-325 | 7 days supply | Qty: 60 | Fill #0

## 2016-06-26 MED FILL — fentaNYL 50 MCG/HR PT72: 50 | 30 days supply | Qty: 10 | Fill #0

## 2016-06-26 MED FILL — LIDOCAINE 2% VISCOUS SOLN: 2 | 3 days supply | Qty: 100 | Fill #1

## 2016-06-26 NOTE — Progress Notes (Signed)
Mr. [name] came to nursing today before his radiation treatment reporting increased pain in his throat. He would like to have his Fentanyl patch increased and reports that he will be out of his breakthrough Hydrocodone pills before he sees Dr. Isidore Moos on Wednesday for his under treat visit. Dr. Isidore Moos communicated through email that she recommended his Fentanyl patch be increased to 50 mcg. Shona Simpson PA was in the office today and prescribed him the Fentanyl patches and a refill for his Hydrocodone. Mr. [name] was able to pick up these prescriptions, and knows on Wednesday to see Dr. Isidore Moos for his scheduled appointment after his radiation treatment for assessment. He know to call with any other concerns.

## 2016-06-26 NOTE — Progress Notes (Signed)
PAPERWORK RECEIVED 9/7, FAXED TO MARISA @ [phone], CONF RECEIVED 9/7, COPY GIVEN TO PATIENT 9/12

## 2016-06-27 ENCOUNTER — Ambulatory Visit
Admission: RE | Admit: 2016-06-27 | Discharge: 2016-06-27 | Disposition: A | Discharge status: Home / Self Care | Payer: Managed Care, Other (non HMO) | Source: Ambulatory Visit | Attending: Radiation Oncology | Admitting: Radiation Oncology

## 2016-06-27 DIAGNOSIS — C090 Malignant neoplasm of tonsillar fossa: Secondary | ICD-10-CM

## 2016-06-27 DIAGNOSIS — Z510 Encounter for antineoplastic radiation therapy: Secondary | ICD-10-CM | POA: Diagnosis not present

## 2016-06-27 MED ORDER — SONAFINE EX EMUL
1.0000 "application " | Freq: Once | CUTANEOUS | Status: AC
  Administered 2016-06-27: 1 via TOPICAL

## 2016-06-28 ENCOUNTER — Ambulatory Visit
Admission: RE | Admit: 2016-06-28 | Discharge: 2016-06-28 | Disposition: A | Discharge status: Home / Self Care | Payer: Managed Care, Other (non HMO) | Source: Ambulatory Visit | Attending: Radiation Oncology | Admitting: Radiation Oncology

## 2016-06-28 ENCOUNTER — Encounter: Payer: Self-pay | Admitting: Radiation Oncology

## 2016-06-28 ENCOUNTER — Ambulatory Visit: Payer: Managed Care, Other (non HMO) | Admitting: Nutrition

## 2016-06-28 VITALS — BP 118/73 | HR 80 | Temp 98.2°F | Ht 71.0 in | Wt 166.0 lb

## 2016-06-28 DIAGNOSIS — C090 Malignant neoplasm of tonsillar fossa: Secondary | ICD-10-CM

## 2016-06-28 DIAGNOSIS — Z510 Encounter for antineoplastic radiation therapy: Secondary | ICD-10-CM | POA: Diagnosis not present

## 2016-06-28 NOTE — Progress Notes (Signed)
Nutrition follow-up completed with patient receiving radiation for tonsil cancer. Weight decreased and documented as 166.7 pounds on September 13. Patient is struggling with eating and drinking adequate calories and protein. Continues to force boost plus and Carnation breakfast essentials. Patient anxious for treatment to be completed.  Nutrition diagnosis: Increased nutrition needs continue.  Intervention: Patient was educated to continue strategies for increased calories and protein, especially over the next 4-6 weeks. Educated him to continue baking soda and salt water rinses. Encouraged smooth pured foods when patient is able. Provided additional samples and protein powder. Teach back method used.  Monitoring, evaluation, goals: Patient will continue to work to tolerate adequate calories and protein to minimize weight loss and promote healing.  Next visit: Patient canceled last nutrition visit but states he will call me with further questions or concerns.  **Disclaimer: This note was dictated with voice recognition software. Similar sounding words can inadvertently be transcribed and this note may contain transcription errors which may not have been corrected upon publication of note.**

## 2016-06-28 NOTE — Progress Notes (Signed)
[name] is here for his 31st fraction of radiation to his Tonsil. He rates his pain a 7/10 at this time. He tells me it has been a 10/10 at times . He is using a 50 mcg Fentanyl patch and hydrocodone every 3 hours with some relief. He reports a dry area in his throat that is very painful and will increase in pain as the day goes on. His skin over his neck is red, burning with peeling noted to the Left side. He using sonafine to these areas. He reports severe pain to his neck when turning his head side to side or changing positions. He is drinking 4 carnations instant breakfasts daily and 2% milk. He is drinking 70 ounces of water daily. He reports having normal bowel movements, and his taking laxatives. He would like to talk to Dr. Isidore Moos about ending his treatment earlier, he tells me at this point he is not able to look past Friday.  BP 118/73   Pulse 80   Temp 98.2 F (36.8 C)   Ht 5\' 11"  (1.803 m)   Wt 166 lb (75.3 kg)   SpO2 100% Comment: room air  BMI 23.15 kg/m    Wt Readings from Last 3 Encounters:  06/28/16 166 lb (75.3 kg)  06/23/16 167 lb 6.4 oz (75.9 kg)  06/16/16 169 lb 14.4 oz (77.1 kg)

## 2016-06-28 NOTE — Progress Notes (Signed)
Weekly Management Note:  Outpatient    ICD-9-CM ICD-10-CM   1. 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  CBCT/MVCT images/Port film x-rays were reviewed.  The chart was checked.  Mr. Reveal is here for his 31st fraction of radiation to his tonsil. He rates his pain a 7/10 at this time. He tells me it has been 10/10 at times. He is using a 50 mcg Fentanyl patch and hydrocodone every 3 hours with some relief. He reports a dry area in his throat that is very painful and will increase in pain as the day goes on. His skin over his neck is red, burning, with peeling on the left side. He is using Sonafine on these areas. He reports severe pain to his neck when turning his head side to side or changing positions. He is drinking 4 carnation instant breakfasts daily and 2% milk. He is drinking 70 oz of water daily. He reports having normal bowel movements, and he is taking laxatives. He would like to talk  about ending his treatment earlier, he tells the nurse at this point he is not able to look past Friday.  Physical Findings:  Wt Readings from Last 3 Encounters:  06/28/16 166 lb (75.3 kg)  06/23/16 167 lb 6.4 oz (75.9 kg)  06/16/16 169 lb 14.4 oz (77.1 kg)    height is 5\' 11"  (1.803 m) and weight is 166 lb (75.3 kg). His temperature is 98.2 F (36.8 C). His blood pressure is 118/73 and his pulse is 80. His oxygen saturation is 100%.  Orthostatic vitals taken: bp sitting 118/78, hr 76, bp standing 99/72, hr 83.  Confluent mucositis in oropharynx. Moist mucous membranes. No thrush.  CBC    Component Value Date/Time   WBC 7.4 03/12/2016 0345   RBC 4.83 03/12/2016 0345   HGB 14.4 03/12/2016 0345   HGB 15.7 10/22/2012 0146   HCT 41.6 03/12/2016 0345   HCT 46.1 10/22/2012 0146   PLT 227 03/12/2016 0345   PLT 254 10/22/2012 0146   MCV 86.1 03/12/2016 0345   MCV 88  10/22/2012 0146   MCH 29.8 03/12/2016 0345   MCHC 34.6 03/12/2016 0345   RDW 12.5 03/12/2016 0345   RDW 12.7 10/22/2012 0146     CMP     Component Value Date/Time   NA 138 03/12/2016 0345   NA 140 10/22/2012 0146   K 3.4 (L) 03/12/2016 0345   K 3.9 10/22/2012 0146   CL 104 03/12/2016 0345   CL 103 10/22/2012 0146   CO2 28 03/12/2016 0345   CO2 27 10/22/2012 0146   GLUCOSE 118 (H) 03/12/2016 0345   GLUCOSE 113 (H) 10/22/2012 0146   BUN 10.6 05/09/2016 1123   CREATININE 1.1 05/09/2016 1123   CALCIUM 9.4 03/12/2016 0345   CALCIUM 8.8 10/22/2012 0146   GFRNONAA >60 03/12/2016 0345   GFRNONAA >60 10/22/2012 0146   GFRAA >60 03/12/2016 0345   GFRAA >60 10/22/2012 0146    Impression:  The patient is tolerating radiotherapy.   Plan: Continue treatment as planned. Advised patient to complete radiation course. Offered to change the 2 a day appointment on Monday to 1 appointment on Monday and 1 appointment on Tuesday.   Discussed increasing Fentanyl from 50 mcg to 75 mcg. Patient already has 25 mcg patches if he wishes to add them to the  37mcg ones.  Advised patient to reduce Sonafine cream - it burns - and use neosporin instead. Suggested using tea bags on neck to soothe the skin.  He is miserable but willing to finish RT.  Reluctant to increase pain meds but will consider this as above.  A lot of empathetic listening and encouragement took place at this visit. -----------------------------------  Eppie Gibson, MD    This document serves as a record of services personally performed by Eppie Gibson, MD. It was created on her behalf by Bethann Humble, a trained medical scribe. The creation of this record is based on the scribe's personal observations and the provider's statements to them. This document has been checked and approved by the attending provider.

## 2016-06-29 ENCOUNTER — Ambulatory Visit
Admission: RE | Admit: 2016-06-29 | Discharge: 2016-06-29 | Disposition: A | Discharge status: Home / Self Care | Payer: Managed Care, Other (non HMO) | Source: Ambulatory Visit | Attending: Radiation Oncology | Admitting: Radiation Oncology

## 2016-06-29 ENCOUNTER — Encounter: Payer: Self-pay | Admitting: Radiation Oncology

## 2016-06-29 DIAGNOSIS — Z510 Encounter for antineoplastic radiation therapy: Secondary | ICD-10-CM | POA: Diagnosis not present

## 2016-06-29 NOTE — Progress Notes (Signed)
Paperwork received from doctor, faxed to [facility] [phone], conf received 06/29/16, copy given to patient

## 2016-06-30 ENCOUNTER — Ambulatory Visit: Admission: RE | Admit: 2016-06-30 | Payer: Managed Care, Other (non HMO) | Source: Ambulatory Visit

## 2016-06-30 ENCOUNTER — Ambulatory Visit: Payer: Managed Care, Other (non HMO)

## 2016-07-03 ENCOUNTER — Ambulatory Visit: Payer: Managed Care, Other (non HMO)

## 2016-07-03 ENCOUNTER — Encounter: Payer: Self-pay | Admitting: Radiation Oncology

## 2016-07-03 ENCOUNTER — Ambulatory Visit
Admission: RE | Admit: 2016-07-03 | Discharge: 2016-07-03 | Disposition: A | Discharge status: Home / Self Care | Payer: Managed Care, Other (non HMO) | Source: Ambulatory Visit | Attending: Radiation Oncology | Admitting: Radiation Oncology

## 2016-07-03 ENCOUNTER — Encounter: Payer: Self-pay | Admitting: Nutrition

## 2016-07-03 DIAGNOSIS — Z510 Encounter for antineoplastic radiation therapy: Secondary | ICD-10-CM | POA: Diagnosis not present

## 2016-07-03 DIAGNOSIS — C090 Malignant neoplasm of tonsillar fossa: Secondary | ICD-10-CM

## 2016-07-03 DIAGNOSIS — Z923 Personal history of irradiation: Secondary | ICD-10-CM | POA: Diagnosis not present

## 2016-07-03 DIAGNOSIS — R52 Pain, unspecified: Secondary | ICD-10-CM | POA: Diagnosis not present

## 2016-07-03 MED ORDER — GUAIFENESIN ER 600 MG PO TB12: ORAL_TABLET | ORAL | 3 refills | Status: DC

## 2016-07-03 MED ORDER — HYDROCODONE-ACETAMINOPHEN 7.5-325 MG PO TABS: 1.0000 | ORAL_TABLET | ORAL | 0 refills | Status: DC | PRN

## 2016-07-03 NOTE — Progress Notes (Signed)
[name] is here for his 33st fraction of radiation to his Tonsil. He rates his pain a 7/10 at this time.  He is using a 50 mcg Fentanyl patch and hydrocodone every 3-4 hours with some relief. He reports a dry area in his throat that is very painful and will increase in pain as the day goes on. His skin over his neck is red, burning with peeling noted to the Left side. He he has not been using the sonafine to these areas the past week. The sonafine is causing burning and the area feels better without the sonafine.   Has been applying a warm cloth to the area to smooth.  Not having severe pain to his neck when turning his head side to side or changing positions. He is drinking 4 carnations instant breakfasts daily and 2% milk. He is drinking 70 ounces of water daily. He reports having normal bowel movements, and his taking laxatives.  Wt Readings from Last 3 Encounters:  07/03/16 164 lb 8 oz (74.6 kg)  06/28/16 166 lb (75.3 kg)  06/23/16 167 lb 6.4 oz (75.9 kg)  BP 109/76 (BP Location: Right Arm, Patient Position: Sitting, Cuff Size: Normal)   Pulse 81   Temp 98.4 F (36.9 C) (Oral)   Resp 18   Ht 5\' 11"  (1.803 m)   Wt 164 lb 8 oz (74.6 kg)   SpO2 100%   BMI 22.94 kg/m

## 2016-07-03 NOTE — Progress Notes (Signed)
Weekly Management Note:  Outpatient    ICD-9-CM ICD-10-CM   1. 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  CBCT/MVCT images/Port film x-rays were reviewed.  The chart was checked. [name] is here for his 33st fraction of radiation to his Tonsil. He rates his pain a 7/10 at this time.  He is using a 50 mcg Fentanyl patch and hydrocodone every 3-4 hours with some relief. He reports a dry area in his throat that is very painful and will increase in pain as the day goes on. His skin over his neck is red, burning with peeling noted to the Left side. He he has not been using the sonafine to these areas the past week. The sonafine is causing burning and the area feels better without the sonafine.   Has been applying a warm cloth to the area to smooth.  Not having severe pain to his neck when turning his head side to side or changing positions. He is drinking 4 carnations instant breakfasts daily and 2% milk. He is drinking 70 ounces of water daily. He reports having normal bowel movements, and his taking laxatives.   Physical Findings:  Wt Readings from Last 3 Encounters:  07/03/16 164 lb 8 oz (74.6 kg)  06/28/16 166 lb (75.3 kg)  06/23/16 167 lb 6.4 oz (75.9 kg)    height is 5\' 11"  (1.803 m) and weight is 164 lb 8 oz (74.6 kg). His oral temperature is 98.4 F (36.9 C). His blood pressure is 109/76 and his pulse is 81. His respiration is 18 and oxygen saturation is 100%.  Orthostatic vitals taken: bp sitting 118/78, hr 76, bp standing 99/72, hr 83.  Confluent mucositis in oropharynx. Moist mucous membranes. No thrush. Skin is dry with dry desquamation in patches.    CBC    Component Value Date/Time   WBC 7.4 03/12/2016 0345   RBC  4.83 03/12/2016 0345   HGB 14.4 03/12/2016 0345   HGB 15.7 10/22/2012 0146   HCT 41.6 03/12/2016 0345   HCT 46.1 10/22/2012 0146   PLT 227 03/12/2016 0345   PLT 254 10/22/2012 0146   MCV 86.1 03/12/2016 0345   MCV 88 10/22/2012 0146   MCH 29.8 03/12/2016 0345   MCHC 34.6 03/12/2016 0345   RDW 12.5 03/12/2016 0345   RDW 12.7 10/22/2012 0146     CMP     Component Value Date/Time   NA 138 03/12/2016 0345   NA 140 10/22/2012 0146   K 3.4 (L) 03/12/2016 0345   K 3.9 10/22/2012 0146   CL 104 03/12/2016 0345   CL 103 10/22/2012 0146   CO2 28 03/12/2016 0345   CO2 27 10/22/2012 0146   GLUCOSE 118 (H) 03/12/2016 0345   GLUCOSE 113 (H) 10/22/2012 0146   BUN 10.6 05/09/2016 1123   CREATININE 1.1 05/09/2016 1123   CALCIUM 9.4 03/12/2016 0345   CALCIUM 8.8 10/22/2012 0146   GFRNONAA >60 03/12/2016 0345   GFRNONAA >60 10/22/2012 0146   GFRAA >60 03/12/2016 0345   GFRAA >60 10/22/2012 0146    Impression:  The patient is tolerating radiotherapy.   Plan: Continue treatment as planned.    Advised patient to use Radiaplex cream if  tolerated, otherwise, try Aloe gel  He is miserable but willing to finish RT.  Wants to stay at current pain meds dose.  Yankauer suction ordered from home health today for copious secretions.  Mucinex Rx.  Declined scopolamine.  A lot of empathetic listening and encouragement took place at this visit.  F/u in 2wk  -----------------------------------  [name] Gibson, MD    This document serves as a record of services personally performed by [name] Gibson, MD. It was created on her behalf by Bethann Humble, a trained medical scribe. The creation of this record is based on the scribe's personal observations and the provider's statements to them. This document has been checked and approved by the attending provider.

## 2016-07-04 ENCOUNTER — Ambulatory Visit
Admission: RE | Admit: 2016-07-04 | Discharge: 2016-07-04 | Disposition: A | Discharge status: Home / Self Care | Payer: Managed Care, Other (non HMO) | Source: Ambulatory Visit | Attending: Radiation Oncology | Admitting: Radiation Oncology

## 2016-07-04 ENCOUNTER — Telehealth: Payer: Self-pay | Admitting: *Deleted

## 2016-07-04 ENCOUNTER — Ambulatory Visit: Payer: Managed Care, Other (non HMO)

## 2016-07-04 DIAGNOSIS — Z510 Encounter for antineoplastic radiation therapy: Secondary | ICD-10-CM | POA: Diagnosis not present

## 2016-07-04 NOTE — Telephone Encounter (Signed)
CALLED PATIENT TO INFORM OF FU APPT. ON 07-17-16 @ 1 PM, SPOKE WITH PATIENT AND HE IS AWARE OF THIS APPT.

## 2016-07-05 ENCOUNTER — Ambulatory Visit: Payer: Managed Care, Other (non HMO)

## 2016-07-05 ENCOUNTER — Ambulatory Visit
Admission: RE | Admit: 2016-07-05 | Discharge: 2016-07-05 | Disposition: A | Discharge status: Home / Self Care | Payer: Managed Care, Other (non HMO) | Source: Ambulatory Visit | Attending: Radiation Oncology | Admitting: Radiation Oncology

## 2016-07-05 ENCOUNTER — Encounter: Payer: Self-pay | Admitting: Radiation Oncology

## 2016-07-05 ENCOUNTER — Encounter: Payer: Self-pay | Admitting: *Deleted

## 2016-07-05 DIAGNOSIS — Z510 Encounter for antineoplastic radiation therapy: Secondary | ICD-10-CM | POA: Diagnosis not present

## 2016-07-05 NOTE — Progress Notes (Signed)
Oncology Nurse Navigator Documentation  Met with [name] during final RT to offer support and to celebrate end of radiation treatment.  He was accompanied by his mother and close friend. I provided him a Certificate of Recognition for his wife for her supportive care. I explained that my role as navigator will continue for several more months and that I will be calling and/or joining him during follow-up visits.   I encouraged him to call me with needs/concerns.    [name], RN, BSN, [facility] [place] [phone]

## 2016-07-06 ENCOUNTER — Ambulatory Visit: Payer: Managed Care, Other (non HMO)

## 2016-07-12 NOTE — Progress Notes (Signed)
  Radiation Oncology         [phone] ________________________________  Name: [name] MRN: [id number]  Date: 07/05/2016  DOB: 1975/12/21  End of Treatment Note  Diagnosis:    ICD-9-CM ICD-10-CM   1. 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  He received treatment BID no more than once/ week, otherwise daily  Plan: The patient has completed radiation treatment. The patient will return to radiation oncology clinic for routine followup in one half month. I advised them to call or return sooner if they have any questions or concerns related to their recovery or treatment.  -----------------------------------  Eppie Gibson, MD

## 2016-07-12 NOTE — Progress Notes (Addendum)
[name] presents for follow up of radiation completed   Pain issues, if any: 3/10 throat Using a feeding tube?: No Weight changes, if any:  Wt Readings from Last 3 Encounters:  07/17/16 155 lb 9.6 oz (70.6 kg)  07/03/16 164 lb 8 oz (74.6 kg)  06/28/16 166 lb (75.3 kg)   Swallowing issues, if any: Pain when swallowing has improved some each day.  Has some sore areas and white spots on his throat.  Still taking Fluconazle. Smoking or chewing tobacco? No Using fluoride trays daily? Yes daily Last ENT visit was on: NoOther notable issues, if any:  BP (!) 136/92 (BP Location: Right Arm, Patient Position: Sitting, Cuff Size: Normal)   Pulse 88   Temp 97.7 F (36.5 C) (Oral)   Resp 18   Ht 5\' 11"  (1.803 m)   Wt 155 lb 9.6 oz (70.6 kg)   SpO2 100%   BMI 21.70 kg/m    Orthostatics: BP sitting 120/76, pulse 76, BP standing 120/85 pulse 82

## 2016-07-13 ENCOUNTER — Ambulatory Visit: Payer: Managed Care, Other (non HMO)

## 2016-07-13 ENCOUNTER — Encounter: Payer: Self-pay | Admitting: Radiation Oncology

## 2016-07-13 DIAGNOSIS — C090 Malignant neoplasm of tonsillar fossa: Secondary | ICD-10-CM

## 2016-07-14 ENCOUNTER — Telehealth: Payer: Self-pay | Admitting: *Deleted

## 2016-07-14 MED ORDER — FLUCONAZOLE 100 MG PO TABS: ORAL_TABLET | ORAL | 0 refills | Status: DC

## 2016-07-14 NOTE — Telephone Encounter (Signed)
Oncology Nurse Navigator Documentation  Called [name] to check on well being s/p completion of RT last week.  He reported:  Developed an "infection" in throat, "white patches back of mouth", Rx for fluconazole called in by Dr. Isidore Moos.  Taking PRN pain med 2-3 times daily for throat soreness which is beginning to resolve.  Was beginning to eat soft foods until thrush developed.  Overall, feeling better. Subjectively, his voice was clearer, he expressed sense of humor which was absent during final weeks of RT. He voiced understanding of 10/2 2:00 PM follow-up appt with Dr. Isidore Moos.  [name], RN, BSN, [place] [facility] [place] [phone]

## 2016-07-14 NOTE — Progress Notes (Signed)
ICD-9-CM ICD-10-CM   1. 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 This sounds like a classic candidal infection.  I called in a Rx for fluconazole and instructed them to take 200mg  that evening followed by 100mg  daily x 20 more days.   I will see him for followup on Oct 2 but she was encouraged to call if he needs to be seen before the weekend on 9-29. Also may go to the ED over the weekend if condition doesn't improve. She appreciated the advice. -----------------------------------  Eppie Gibson, MD

## 2016-07-17 ENCOUNTER — Ambulatory Visit
Admission: RE | Admit: 2016-07-17 | Discharge: 2016-07-17 | Disposition: A | Discharge status: Home / Self Care | Payer: Managed Care, Other (non HMO) | Source: Ambulatory Visit | Attending: Radiation Oncology | Admitting: Radiation Oncology

## 2016-07-17 ENCOUNTER — Encounter: Payer: Self-pay | Admitting: Radiation Oncology

## 2016-07-17 ENCOUNTER — Telehealth: Payer: Self-pay | Admitting: *Deleted

## 2016-07-17 VITALS — BP 136/92 | HR 88 | Temp 97.7°F | Resp 18 | Ht 71.0 in | Wt 155.6 lb

## 2016-07-17 DIAGNOSIS — C090 Malignant neoplasm of tonsillar fossa: Secondary | ICD-10-CM | POA: Diagnosis not present

## 2016-07-17 DIAGNOSIS — B379 Candidiasis, unspecified: Secondary | ICD-10-CM | POA: Diagnosis not present

## 2016-07-17 DIAGNOSIS — Z79899 Other long term (current) drug therapy: Secondary | ICD-10-CM | POA: Diagnosis not present

## 2016-07-17 DIAGNOSIS — R634 Abnormal weight loss: Secondary | ICD-10-CM

## 2016-07-17 MED ORDER — FENTANYL 12 MCG/HR TD PT72: 12.5000 ug | MEDICATED_PATCH | TRANSDERMAL | 0 refills | Status: DC

## 2016-07-17 MED ORDER — HYDROCODONE-ACETAMINOPHEN 7.5-325 MG PO TABS: 1.0000 | ORAL_TABLET | ORAL | 0 refills | Status: DC | PRN

## 2016-07-17 MED ORDER — FENTANYL 25 MCG/HR TD PT72: MEDICATED_PATCH | TRANSDERMAL | 0 refills | Status: DC

## 2016-07-17 NOTE — Telephone Encounter (Signed)
CALLED PATIENT TO INFORM OF APPT. WITH GRETCHEN DAWSON ON 08-07-16  @ 2:30 PM, SPOKE WITH PATIENT AND HE IS AWARE OF THIS APPT.

## 2016-07-17 NOTE — Progress Notes (Signed)
Radiation Oncology         [phone] ________________________________  Name: [name] MRN: [id number]  Date: 07/17/2016  DOB: 02-May-1976  Follow-Up Visit Note  CC: Donnie Coffin, MD  Heath Lark, MD  Diagnosis and Prior Radiotherapy:       ICD-9-CM ICD-10-CM   1. 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  1.5 weeks post treatment with definitive RT, no chemo.   Feels better. Pain issues, if any: 3/10 throat Using a feeding tube?: No Weight changes, if any:  Wt Readings from Last 3 Encounters:  07/17/16 155 lb 9.6 oz (70.6 kg)  07/03/16 164 lb 8 oz (74.6 kg)  06/28/16 166 lb (75.3 kg)   Swallowing issues, if any: Pain when swallowing has improved some each day.   Still taking Fluconazole which helped int he past 3 days. Smoking or chewing tobacco? No Using fluoride trays daily? Yes daily Last ENT visit was on: NoOther notable issues, if any:  BP (!) 136/92 (BP Location: Right Arm, Patient Position: Sitting, Cuff Size: Normal)   Pulse 88   Temp 97.7 F (36.5 C) (Oral)   Resp 18   Ht 5\' 11"  (1.803 m)   Wt 155 lb 9.6 oz (70.6 kg)   SpO2 100%   BMI 21.70 kg/m    Orthostatics: BP sitting 120/76, pulse 76, BP standing 120/85 pulse 82                     ALLERGIES:  has No Known Allergies.  Meds: Current Outpatient Prescriptions  Medication Sig Dispense Refill  . fentaNYL (DURAGESIC - DOSED MCG/HR) 50 MCG/HR Place 1 patch (50 mcg total) onto the skin every 3 (three) days. 10 patch 0  . fluconazole (DIFLUCAN) 100 MG tablet Take 2 tablets today, then 1 tablet daily x 20 more days. 22 tablet 0  . HYDROcodone-acetaminophen (NORCO) 7.5-325 MG tablet Take 1 tablet by mouth every 3 (three) hours as needed for moderate pain. 100 tablet 0  . LORazepam (ATIVAN) 0.5 MG tablet Dissolve one tablet under tongue up to  TID prn nausea. 30 tablet 0  . promethazine (PHENERGAN) 25 MG tablet Take 1 tablet (25 mg total) by mouth every 6 (six) hours as needed for nausea or vomiting. 30 tablet 5  . ranitidine (ZANTAC) 150 MG capsule Take 150 mg by mouth as needed for heartburn.    . senna (SENOKOT) 8.6 MG TABS tablet Take 1-2 tablets PO daily as needed to prevent constipation 60 tablet 2  . sodium fluoride (FLUORISHIELD) 1.1 % GEL dental gel Instill one drop of gel per tooth space of fluoride tray. Place over teeth for 5 minutes. Remove. Spit out excess. Repeat nightly. 120 mL prn  . guaiFENesin (MUCINEX) 600 MG 12 hr tablet Take [phone] mg PO BID. (Patient not taking: Reported on 07/17/2016) 60 tablet 3  . lidocaine (XYLOCAINE) 2 % solution Mix 1 part 2% viscous lidocaine,1 part H2O. Swallow +/- swish 31mL of this mixture, 74min before meals and at bedtime, up to QID (Patient not taking: Reported on 07/17/2016) 100 mL 5  . sucralfate (CARAFATE) 1 g tablet Take 1 tablet (1 g total) by mouth 4 (four) times daily -  with meals and at bedtime. (Patient not taking: Reported on 07/17/2016) 120 tablet 2   No current facility-administered medications for this  encounter.     Physical Findings: The patient is in no acute distress. Patient is alert and oriented. Wt Readings from Last 3 Encounters:  07/17/16 155 lb 9.6 oz (70.6 kg)  07/03/16 164 lb 8 oz (74.6 kg)  06/28/16 166 lb (75.3 kg)    height is 5\' 11"  (1.803 m) and weight is 155 lb 9.6 oz (70.6 kg). His oral temperature is 97.7 F (36.5 C). His blood pressure is 136/92 (abnormal) and his pulse is 88. His respiration is 18 and oxygen saturation is 100%. .  General: Alert and oriented, in no acute distress HEENT: Head is normocephalic. Extraocular movements are intact. Oropharynx is notable for confluent mucositis in right oropharynx, healing, no thrush. Mucosa moist Neck: Neck is notable for dry skin, healing well; palpable left upper neck mass has regressed but is still  palpable, ie 2-2.5cm and non bulging Skin: Skin in treatment fields shows satisfactory healing    Lymphatics: see Neck Exam Psychiatric: Judgment and insight are intact. Affect is appropriate.   Lab Findings: Lab Results  Component Value Date   WBC 7.4 03/12/2016   HGB 14.4 03/12/2016   HCT 41.6 03/12/2016   MCV 86.1 03/12/2016   PLT 227 03/12/2016   CMP     Component Value Date/Time   NA 138 03/12/2016 0345   NA 140 10/22/2012 0146   K 3.4 (L) 03/12/2016 0345   K 3.9 10/22/2012 0146   CL 104 03/12/2016 0345   CL 103 10/22/2012 0146   CO2 28 03/12/2016 0345   CO2 27 10/22/2012 0146   GLUCOSE 118 (H) 03/12/2016 0345   GLUCOSE 113 (H) 10/22/2012 0146   BUN 10.6 05/09/2016 1123   CREATININE 1.1 05/09/2016 1123   CALCIUM 9.4 03/12/2016 0345   CALCIUM 8.8 10/22/2012 0146   GFRNONAA >60 03/12/2016 0345   GFRNONAA >60 10/22/2012 0146   GFRAA >60 03/12/2016 0345   GFRAA >60 10/22/2012 0146    Lab Results  Component Value Date   TSH 0.874 04/25/2016    Radiographic Findings: No results found.  Impression/Plan:    1) Head and Neck Cancer Status: healing well, recovering from yeast infection  2) Nutritional Status: push po intake, staying hydrated PEG tube: none  3) Risk Factors: p16+, abstaining from tobacco, ETOH in moderation  4) Swallowing: functional  5) Dental: Encouraged to continue regular followup with dentistry, and dental hygiene including fluoride rinses.    6) Thyroid function:  Lab Results  Component Value Date   TSH 0.874 04/25/2016    7) Other: tapering pain meds.  Will go down from 49mcg to 37.22mcg duragesic in near future. Hydrocodone PRN. Refilled all today.  8) Follow-up in 3 wks w/ Mike Craze NP for further tapering of pain meds, survivorship, symptom management, etc.  F/u in late Dec with PET and TSH with me. The patient was encouraged to call with any issues or questions before then.     _____________________________________   Eppie Gibson, MD

## 2016-07-21 ENCOUNTER — Ambulatory Visit: Payer: Managed Care, Other (non HMO) | Attending: Radiation Oncology

## 2016-07-21 DIAGNOSIS — R1319 Other dysphagia: Secondary | ICD-10-CM | POA: Diagnosis not present

## 2016-07-21 NOTE — Therapy (Signed)
[facility] [address] [place], [place], [address] Phone: [phone]   Fax:  [phone]  Speech Language Pathology Treatment  Patient Details  Name: [name] MRN: [id number] Date of Birth: 06/03/1976 Referring Provider: Eppie Gibson M.D.  Encounter Date: 07/21/2016      End of Session - 07/21/16 1056    Visit Number 3   Number of Visits 7   Date for SLP Re-Evaluation 10/26/16   SLP Start Time 0934   SLP Stop Time  [id number]   SLP Time Calculation (min) 41 min   Activity Tolerance Patient tolerated treatment well      Past Medical History:  Diagnosis Date  . GERD (gastroesophageal reflux disease)   . Mitral valve prolapse   . Weight loss     Past Surgical History:  Procedure Laterality Date  . ELBOW SURGERY Right   . KNEE SURGERY Left   . NASOPHARYNGOSCOPY Left 04/14/2016   Procedure: BASE NASAL NASOPHARYNX;  Surgeon: Jerrell Belfast, MD;  Location: [facility];  Service: ENT;  Laterality: Left;  . TONSILLECTOMY Left 04/14/2016   Procedure: TONSILLECTOMY AND BIOPSY OF TONGUE ;  Surgeon: Jerrell Belfast, MD;  Location: [facility];  Service: ENT;  Laterality: Left;    There were no vitals filed for this visit.      Subjective Assessment - 07/21/16 1000    Subjective Pt is eating 3 times a day, then snacks through the day.   Patient is accompained by: --  mom   Currently in Pain? No/denies               ADULT SLP TREATMENT - 07/21/16 1001      General Information   Behavior/Cognition Alert;Cooperative;Pleasant mood     Treatment Provided   Treatment provided Dysphagia     Dysphagia Treatment   Temperature Spikes Noted No   Respiratory Status Room air   Oral Cavity - Dentition Adequate natural dentition   Treatment Methods Skilled observation;Therapeutic exercise;Patient/caregiver education   Patient observed directly with PO's Yes   Type of PO's observed  Thin liquids;Dysphagia 3 (soft)   Liquids provided via --  bottle   Oral Phase Signs & Symptoms --  none noted   Pharyngeal Phase Signs & Symptoms --  none noted   Other treatment/comments Pt without overt s/s aspiration with american cheese and water from a bottle. He req'd min verbal cues occasionally for procedure of HEP, but was indpendent by session's end. SLP stressed to pt he has to complete HEP at least BID to afford himself best opportunity for swallowing remaining WNL.     Assessment / Recommendations / Plan   Plan Continue with current plan of care  SLP to see pt in two months     Progression Toward Goals   Progression toward goals Progressing toward goals          SLP Education - 07/21/16 1055    Education provided Yes   Education Details need to do HEP BID, late effects head/neck cancer on swallowing   Person(s) Educated Patient   Methods Explanation;Demonstration;Verbal cues;Handout   Comprehension Verbalized understanding;Returned demonstration          SLP Short Term Goals - 07/21/16 1003      SLP SHORT TERM GOAL #1   Title pt will demo HEP with rare min A over 2 sessions   Time 1   Period --  visits   Status On-going  SLP SHORT TERM GOAL #2   Title pt will tell SLP why he is completing HEP over two sessions   Baseline one session 07-21-16   Time 1   Period --  visits   Status On-going     SLP SHORT TERM GOAL #3   Title pt will tell SLP 3 s/s aspiration PNA with modified independence over two sesions   Time 1   Period --  visits   Status On-going          SLP Long Term Goals - 07/21/16 1057      SLP LONG TERM GOAL #1   Title pt will demo HEP with independence over 3 sessions   Time 4   Period --  visits   Status On-going     SLP LONG TERM GOAL #2   Title pt will tell SLP how a food journal can assist return to full WNL PO diet   Time 4   Period --  visits   Status On-going          Plan - 07/21/16 1005    Clinical  Impression Statement Pt's swallowing skills have improved since last session. Pt is not performing HEP to suggested frequency. Encouraged also to cont PO intake. Skilled ST is necessary to continually assess pt's swallowing safety during and after treatment, as well as to assess continued correct procedure with HEP.    Speech Therapy Frequency --  approx every four weeks   Duration --  5 therapy visits   Treatment/Interventions Aspiration precaution training;Pharyngeal strengthening exercises;Diet toleration management by SLP;Trials of upgraded texture/liquids;SLP instruction and feedback;Patient/family education;Compensatory techniques  any combination or all may be used   Potential to Achieve Goals Good   SLP Home Exercise Plan provided today   Consulted and Agree with Plan of Care Patient      Patient will benefit from skilled therapeutic intervention in order to improve the following deficits and impairments:   Other dysphagia    Problem List Patient Active Problem List   Diagnosis Date Noted  . Cancer of tonsillar fossa ([facility]) 04/21/2016  . Mass of left side of neck 04/14/2016  . 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 Neck mass 04/14/2016    [facility] ,MS, CCC-SLP  07/21/2016, 10:58 AM  [facility] [address] [place], [place], [address] Phone: [phone]   Fax:  [phone]   Name: [name] MRN: [id number] Date of Birth: 11/14/1975

## 2016-08-03 ENCOUNTER — Telehealth: Payer: Self-pay | Admitting: *Deleted

## 2016-08-03 NOTE — Telephone Encounter (Addendum)
Oncology Nurse Navigator Documentation  Called Mr. Errante to check on his well-being.  He is 62m post-RT. He stated:  Continues to experience throat pain but gradually resolving.  Has decreased fentanyl patch from 50 mcg to 37 mcg, taking PRN Norco about q 4h.  Diet progressing, eating soft/solid foods eg chicken patties, mac & cheese, hot dogs, hamburgers.  Drinking fluids. Not drinking nutritional supplements.  Maintaining weight at about 153 lbs.  Does not feel the need to see Nutrition at this time.  Having hard small BMs q 2-3 days, taking Senokot daily.  I encouraged daily OTC Miralax to improve regularity.  Had SLP appt earlier this month, next appt scheduled for early December. He understands to call me with needs/concerns.  [name], RN, BSN, [facility] [phone]

## 2016-08-07 ENCOUNTER — Encounter (HOSPITAL_COMMUNITY): Payer: Self-pay | Admitting: Dentistry

## 2016-08-07 ENCOUNTER — Ambulatory Visit (HOSPITAL_COMMUNITY): Payer: Self-pay | Admitting: Dentistry

## 2016-08-07 ENCOUNTER — Ambulatory Visit (HOSPITAL_BASED_OUTPATIENT_CLINIC_OR_DEPARTMENT_OTHER): Payer: Managed Care, Other (non HMO) | Admitting: Adult Health

## 2016-08-07 ENCOUNTER — Encounter: Payer: Self-pay | Admitting: Adult Health

## 2016-08-07 VITALS — BP 112/69 | HR 70 | Temp 98.5°F | Wt 152.0 lb

## 2016-08-07 DIAGNOSIS — Z923 Personal history of irradiation: Secondary | ICD-10-CM

## 2016-08-07 DIAGNOSIS — K117 Disturbances of salivary secretion: Secondary | ICD-10-CM

## 2016-08-07 DIAGNOSIS — R52 Pain, unspecified: Secondary | ICD-10-CM

## 2016-08-07 DIAGNOSIS — C099 Malignant neoplasm of tonsil, unspecified: Secondary | ICD-10-CM | POA: Diagnosis not present

## 2016-08-07 DIAGNOSIS — K08409 Partial loss of teeth, unspecified cause, unspecified class: Secondary | ICD-10-CM

## 2016-08-07 DIAGNOSIS — R682 Dry mouth, unspecified: Secondary | ICD-10-CM

## 2016-08-07 DIAGNOSIS — C090 Malignant neoplasm of tonsillar fossa: Secondary | ICD-10-CM

## 2016-08-07 DIAGNOSIS — R432 Parageusia: Secondary | ICD-10-CM

## 2016-08-07 DIAGNOSIS — R1310 Dysphagia, unspecified: Secondary | ICD-10-CM

## 2016-08-07 DIAGNOSIS — K1233 Oral mucositis (ulcerative) due to radiation: Secondary | ICD-10-CM

## 2016-08-07 DIAGNOSIS — K029 Dental caries, unspecified: Secondary | ICD-10-CM

## 2016-08-07 MED ORDER — FENTANYL 25 MCG/HR TD PT72: MEDICATED_PATCH | TRANSDERMAL | 0 refills | Status: DC

## 2016-08-07 NOTE — Progress Notes (Signed)
08/07/2016  Patient Name:   [name] Date of Birth:   1976/01/01 Medical Record Number: [id number]  BP 112/69 (BP Location: Left Arm)   Pulse 70   Temp 98.5 F (36.9 C) (Oral)   Wt 152 lb (68.9 kg)   BMI 21.20 kg/m   [name] presents for oral examination after radiation therapy. Patient has completed radiation treatments 05/22/16 thru 07/05/16.  REVIEW OF CHIEF COMPLAINTS:  DRY MOUTH: Yes HARD TO SWALLOW: Yes, at times.  HURT TO SWALLOW: Yes, at times. TASTE CHANGES: Taste is slowly returning. Patient still can not taste any sweet at this time. SORES IN MOUTH: Yes. Patient had recent episode of thrush that was treated with Diflucan. TRISMUS: No problems with trismus. WEIGHT: 152 pounds.  HOME OH REGIMEN:  BRUSHING: 4 times a day FLOSSING: Not flossing. Patient indicates that he was instructed not to floss. The patient was instructed to floss at bedtime. RINSING: Using salt water and baking soda rinses and Biotene rinses. FLUORIDE: Using fluoride at bedtime. TRISMUS EXERCISES:  Maximum interincisal opening: 38 mm.   DENTAL EXAM:  Oral Hygiene:(PLAQUE): Some plaque noted. Oral hygiene improvement was highly suggested. Instructed patient to schedule dental cleaning appointment with Dr. Johnnye Sima for approximately one month from today. LOCATION OF MUCOSITIS: Back of throat. No evidence of oral candidiasis. DESCRIPTION OF SALIVA: Decreased saliva with ropey saliva. Moderate xerostomia. ANY EXPOSED BONE: None noted OTHER WATCHED AREAS: Previous extraction sites. Dental caries previously noted and not restored before radiation therapy. DX: Xerostomia, Dysgeusia, Dysphagia, Odynophagia, Accretions, Mucositis and dental caries  RECOMMENDATIONS: 1. Brush after meals and at bedtime. Floss at bedtime. Use fluoride at bedtime.  2. Use trismus exercises as directed. 3. Use Biotene Rinse or salt water/baking soda rinses. 4. Multiple sips of water as needed. 5. Return to Dr. Johnnye Sima  in one month if able to tolerate dental cleaning and then discuss follow-up therapy of other dental caries and periapical pathology with IV sedation or nitrous oxide sedation as indicated.   Lenn Cal, DDS

## 2016-08-07 NOTE — Patient Instructions (Addendum)
RECOMMENDATIONS: 1. Brush after meals and at bedtime. Floss at bedtime. Use fluoride at bedtime.  2. Use trismus exercises as directed. 3. Use Biotene Rinse or salt water/baking soda rinses. 4. Multiple sips of water as needed. 5. Return to Dr. Johnnye Sima in one month if able to tolerate dental cleaning and then discuss follow-up therapy of other dental caries and periapical pathology with IV sedation or nitrous oxide sedation as indicated.   Lenn Cal, DDS

## 2016-08-07 NOTE — Progress Notes (Signed)
CLINIC:  Survivorship  REASON FOR VISIT:  Routine follow-up for head & neck cancer & to address post-treatment acute survivorship needs.   BRIEF ONCOLOGIC HISTORY:    Cancer of tonsillar fossa ([name])   03/03/2016 Imaging    CT neck: Left jugulodigastric adenopathy correlating with palpable complaint.  Tonsils symmetrically prominent for age.       03/27/2016 Initial Biopsy    Left neck mass (consult slides), fine needle aspirate: Malignant cells present, favor squamous cell carcinoma.       04/07/2016 PET scan    Solitary (L) level 2 LN enlarged and exhibits malignant range FDG uptake.  Symmetric uptake in both tonsillar regions. No definite findings identified to suggest primary site of disease. No evidence of metastatic disease to chest, abdomen, pelvis, or bony structures.       04/14/2016 Procedure    Biopsy of left tonsil: (+) invasive squamous cell carcinoma, p16+.   Biopsies of nasopharynx x 4 (benign), base of tongue x 2 (benign), and left oropharynx (benign).       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    His pain is continuing to improve. He tells me it is "up and down." He currently is using 25 g and 12 g patches. He takes Norco 7.5/325 about every 4 hours for a total of about 6 tabs per day. He is not having any trouble with swallowing the tablets. He pre-medicates before his meals, which adequately helps control his pain. He also uses the lidocaine and water mixture, which he swishes & swallows. He is eating wet cereal and drinking about 2 Carnation Instant  Breakfast shakes per day.  His weight is up 2 pounds from his previous visit about 2.5 weeks ago. He finished a 3 week course of fluconazole for oral candidiasis.  He reports being constipated. He takes 1 tablet of Senokot twice per day and uses the MiraLAX when necessary. He has small, hard bowel movements about every 3 days. He tells me he is drinking plenty of water per day; he drinks about 60 ounces per day.   He fatigues easily and has shortness of breath with exertion. He tried to do some yard work, and had to take frequent breaks. He is surprised by how tired he has been. He would like to restart some type of exercise at the gym, but wanted to make sure that it was okay.   He wants me to look at the hair loss on the back of his head completing radiation. I shared with him that the areas affected by radiation likely do not have returned hair growth. He is working with his Art gallery manager to make this less noticeable.  He tells me he is currently on [employer] insurance through his employer. He would like to ensure that his PET scan is done this calendar year, given that his insurance is at 100% coverage now.   -Pain: Sore throat; Fentanyl patch-37 mcg (25 mcg patch & 12 mcg patch); requiring Norco about 6 times/ day.  -Diet/Taste  changes: Soft foods; taste improving; difficult to eat hot (temperature) foods.  Eats wet cereal; drinks about 2 [employer].  Uses Lidocaine swish/swallow mixture before meals.  -Swallowing: Pain with swallowing; saw Garald Balding, SLP on 07/21/16.   -Dentist: Saw Dr. Enrique Sack today for post-radiation therapy evaluation.  -Weight change:  GAIN ~ 2 lbs since 07/17/16 -Fatigue:  Improving; wants to start exercising again.     ADDITIONAL REVIEW OF SYSTEMS:  ROS, per HPI    CURRENT MEDICATIONS:  Current Outpatient Prescriptions on File Prior to Visit  Medication Sig Dispense Refill  . fentaNYL (DURAGESIC) 12 MCG/HR Place 1 patch (12.5 mcg total) onto the  skin every 3 (three) days. 10 patch 0  . fentaNYL (DURAGESIC) 25 MCG/HR patch Apply 1 patch to skin every 3 days for pain. May add 12 mcg patch PRN. 5 patch 0  . fluconazole (DIFLUCAN) 100 MG tablet Take 2 tablets today, then 1 tablet daily x 20 more days. 22 tablet 0  . guaiFENesin (MUCINEX) 600 MG 12 hr tablet Take [phone] mg PO BID. (Patient not taking: Reported on 07/17/2016) 60 tablet 3  . HYDROcodone-acetaminophen (NORCO) 7.5-325 MG tablet Take 1 tablet by mouth every 3 (three) hours as needed for moderate pain. 120 tablet 0  . lidocaine (XYLOCAINE) 2 % solution Mix 1 part 2% viscous lidocaine,1 part H2O. Swallow +/- swish 42mL of this mixture, 27min before meals and at bedtime, up to QID (Patient not taking: Reported on 07/17/2016) 100 mL 5  . LORazepam (ATIVAN) 0.5 MG tablet Dissolve one tablet under tongue up to TID prn nausea. 30 tablet 0  . promethazine (PHENERGAN) 25 MG tablet Take 1 tablet (25 mg total) by mouth every 6 (six) hours as needed for nausea or vomiting. 30 tablet 5  . ranitidine (ZANTAC) 150 MG capsule Take 150 mg by mouth as needed for heartburn.    . senna (SENOKOT) 8.6 MG TABS tablet Take 1-2 tablets PO daily as needed to prevent constipation 60 tablet 2  . sodium fluoride (FLUORISHIELD) 1.1 % GEL dental gel Instill one drop of gel per tooth space of fluoride tray. Place over teeth for 5 minutes. Remove. Spit out excess. Repeat nightly. 120 mL prn  . sucralfate (CARAFATE) 1 g tablet Take 1 tablet (1 g total) by mouth 4 (four) times daily -  with meals and at bedtime. (Patient not taking: Reported on 07/17/2016) 120 tablet 2   No current facility-administered medications on file prior to visit.     ALLERGIES:  No Known Allergies  SOCIAL HISTORY:  [name] is married and lives with his wife in [place], [place].  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 He denies any tobacco use.  He drinks alcohol  occasionally.    PHYSICAL EXAM:  Vitals:   08/07/16 1446  BP: 103/63  Pulse: 69  Resp: 18  Temp: 97.5 F (36.4 C)     Filed Weights   08/07/16 1446  Weight: 157 lb (71.2 kg)   Weight Date  157 lb (71.2 kg) 08/07/16  155 lb 9.6 oz (70.6 kg) 07/17/16  164 lb 8 oz (74.6 kg) 07/03/16  166 lb (75.3 kg) 06/28/16  169 lb 14.4 oz (77.1 kg) 06/16/16  172 lb (78 kg) 06/12/16  174 lb 1.6 oz (78.971 kg).  Pre-treatment (RT consult date): 04/21/16   General: Male in no acute distress.  Unaccompanied today.  HEENT: Head is  atraumatic and normocephalic. Conjunctivae clear without exudate.  Sclerae anicteric. Oral mucosa is pink and slightly dry; saliva ropey.   Tongue pink.  Posterior oropharynx is red with patchy mucositis; no evidence of thrush.   Lymph: No cervical, supraclavicular, or infraclavicular lymphadenopathy noted on palpation.   Neck: Left neck mass measuring about 2 cm, mobile; Skin on neck is intact and only very mildly hyperpigmented.  Cardiovascular: Normal rate and rhythm. Respiratory: Clear to auscultation bilaterally. Chest expansion symmetric without accessory muscle use; breathing non-labored. GI: Abdomen soft and round. Non-tender. Bowel sounds normoactive.  GU: Deferred.   Neuro: No focal deficits. Steady gait.   Psych: Normal mood and affect for situation. Extremities: No edema. Skin: Warm and dry.    LABORATORY DATA:  None at this visit.  DIAGNOSTIC IMAGING:  None at this visit.    ASSESSMENT & PLAN:  [name] is a pleasant 40 y.o. 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  Patient presents to survivorship clinic today for routine follow-up after finishing treatment.   1. Cancer of the left tonsil:  [name] is continuing to recover from definitive radiation therapy to his head & neck. PET scan will be done and results  reviewed with patient by Dr. Isidore Moos in late 09/2016.  I encouraged [name] to call me with any questions or concerns before his next appointment at the cancer center.   2. Pain: [name] is continuing to heal.  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  He currently is using 37 mcg Fentanyl patches (25 mcg patch and 12 mcg patch), in combination with Norco 7.5/325 every 4 hours as needed.  He has been requiring the Norco about 6 times/day.  However, he feels like his pain is steadily improving.  I provided him a prescription for the Fentanyl 25 g patch today (Fentanyl 25 mcg top Q72H, #5, no refills).  I will see him back in the survivorship clinic in 2 weeks to continue to manage his pain. My hope is he will continue to heal in the upcoming 2 weeks' time and we will be able to work on continuing to wean down on his opiate medications. Perhaps at that time we can remove the 12 g patch, to leave him with 25 g patch of the fentanyl for a few more weeks after that.  I explained my plan to work with him on adequately managing his pain, while reducing the amount of opiate pain medications he requires over time. My goal to accomplish this is over the next 2 months or so.  He agrees with this plan.     3. 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 His current bowel regimen is inadequate, given that he only has a bowel movement every 3 days, and is not completely emptying his bowels. Therefore I instructed him to increase his Senokot to 2 tabs twice daily and start MiraLAX once daily. If she begins to have diarrhea, then he can reduce the Senokot to 1 tablet twice daily. He voiced understanding the importance of bowel regimen while taking opiates and agreed with above plan.  4. Nutrition: [name] has gained about 2 lbs since his last visit, which is encouraging.  I encouraged him to increase his protein,  calorie, and fat intake. I reiterated the importance  of nutrition, stressing that his calorie expenditure is higher than normal given his healing and recovery. I explained the importance of weight gain, particularly during this time after completing treatment. I encouraged him to increase his Carnation Instant Breakfast shakes to 1 additional shake per day, for a total of 3; particularly if he is not going to be able to drink Boost/Ensure.  He also plans to restart his whey protein shakes as well.  I will certainly include Dory Peru, RD should the patient shows signs of losing weight at subsequent follow-up visits.  5. Radiation-induced mucositis/esophagitis: We discussed that it is not uncommon for head and neck cancer patients to experience mucositis and esophagitis as a result of therapy. He had stopped the Carafate, as he did not notice that it was providing him any benefit. I reviewed with him the purpose of the Carafate, and its role in his continued healing. I encouraged him to restart the Carafate, as this may provide improved healing with time of the radiation-induced mucositis and esophagitis. He also takes Zantac for heartburn, which I encouraged him to continue.  6. Fatigue: [name] expressed interest in wanting to exercise again. I encouraged him to take it easy, and to not overdo it at the gym. I reminded him that the calories he burns at the gym he will need to replace with his intake, as we do not want him losing weight. However, we know that patients to engage in physical activity after treatment, have less reported treatment-related fatigue. Therefore, light exercise and light weightlifting would be fine for this patient to consider at this time.   7. Oral health/At risk for dental caries/Xerostomia: He understands that he is at increased risk for dental caries given xerostomia as a result of radiation therapy.   Encouraged him to use Biotene products (gel, mouthwash, or spray) to help  with dry mouth.   Dental recommendations from today's visit with Dr. Enrique Sack. (see below)    Dispo:  -Return to cancer center to see Survivorship NP in 2 weeks to follow-up on symptoms.    A total of 35 minutes was spent in the face-to-face care of this patient, with greater than 50% of that time spent in counseling and care-coordination.   Mike Craze, NP [place] [phone]

## 2016-08-08 ENCOUNTER — Encounter (HOSPITAL_COMMUNITY): Payer: Self-pay | Admitting: Dentistry

## 2016-08-21 ENCOUNTER — Other Ambulatory Visit: Payer: Self-pay

## 2016-08-21 ENCOUNTER — Other Ambulatory Visit (HOSPITAL_BASED_OUTPATIENT_CLINIC_OR_DEPARTMENT_OTHER): Payer: Managed Care, Other (non HMO)

## 2016-08-21 ENCOUNTER — Ambulatory Visit (HOSPITAL_BASED_OUTPATIENT_CLINIC_OR_DEPARTMENT_OTHER): Payer: Managed Care, Other (non HMO) | Admitting: Adult Health

## 2016-08-21 VITALS — BP 127/74 | HR 71 | Temp 97.6°F | Resp 18 | Wt 155.3 lb

## 2016-08-21 DIAGNOSIS — R5383 Other fatigue: Secondary | ICD-10-CM

## 2016-08-21 DIAGNOSIS — C090 Malignant neoplasm of tonsillar fossa: Secondary | ICD-10-CM

## 2016-08-21 LAB — CBC WITH DIFFERENTIAL/PLATELET
BASO%: 0.2 % (ref 0.0–2.0)
Basophils Absolute: 0 10*3/uL (ref 0.0–0.1)
EOS%: 1.5 % (ref 0.0–7.0)
Eosinophils Absolute: 0.1 10*3/uL (ref 0.0–0.5)
HCT: 36.8 % — ABNORMAL LOW (ref 38.4–49.9)
HGB: 12.8 g/dL — ABNORMAL LOW (ref 13.0–17.1)
LYMPH%: 13 % — AB (ref 14.0–49.0)
MCH: 30.1 pg (ref 27.2–33.4)
MCHC: 34.8 g/dL (ref 32.0–36.0)
MCV: 86.6 fL (ref 79.3–98.0)
MONO#: 0.5 10*3/uL (ref 0.1–0.9)
MONO%: 9.6 % (ref 0.0–14.0)
NEUT%: 75.7 % — AB (ref 39.0–75.0)
NEUTROS ABS: 3.6 10*3/uL (ref 1.5–6.5)
Platelets: 207 10*3/uL (ref 140–400)
RBC: 4.25 10*6/uL (ref 4.20–5.82)
RDW: 12.3 % (ref 11.0–14.6)
WBC: 4.7 10*3/uL (ref 4.0–10.3)
lymph#: 0.6 10*3/uL — ABNORMAL LOW (ref 0.9–3.3)

## 2016-08-21 LAB — COMPREHENSIVE METABOLIC PANEL
ALT: 12 U/L (ref 0–55)
AST: 15 U/L (ref 5–34)
Albumin: 4.2 g/dL (ref 3.5–5.0)
Alkaline Phosphatase: 48 U/L (ref 40–150)
Anion Gap: 8 mEq/L (ref 3–11)
BILIRUBIN TOTAL: 2.17 mg/dL — AB (ref 0.20–1.20)
BUN: 10.8 mg/dL (ref 7.0–26.0)
CHLORIDE: 102 meq/L (ref 98–109)
CO2: 29 meq/L (ref 22–29)
CREATININE: 0.8 mg/dL (ref 0.7–1.3)
Calcium: 9.9 mg/dL (ref 8.4–10.4)
EGFR: >90 mL/min/{1.73_m2} (ref >90)
GLUCOSE: 93 mg/dL (ref 70–140)
Potassium: 4.5 mEq/L (ref 3.5–5.1)
SODIUM: 139 meq/L (ref 136–145)
TOTAL PROTEIN: 7.3 g/dL (ref 6.4–8.3)

## 2016-08-21 LAB — TSH: TSH: 1.151 m(IU)/L (ref 0.320–4.118)

## 2016-08-21 MED ORDER — HYDROCODONE-ACETAMINOPHEN 7.5-325 MG PO TABS
1.0000 | ORAL_TABLET | ORAL | 0 refills | Status: DC | PRN
Start: 2016-08-21 — End: 2016-09-04

## 2016-08-21 MED ORDER — FENTANYL 50 MCG/HR TD PT72: 50.0000 ug | MEDICATED_PATCH | TRANSDERMAL | 0 refills | Status: DC

## 2016-08-22 ENCOUNTER — Encounter: Payer: Self-pay | Admitting: Adult Health

## 2016-08-22 ENCOUNTER — Other Ambulatory Visit: Payer: Self-pay | Admitting: Adult Health

## 2016-08-22 ENCOUNTER — Telehealth: Payer: Self-pay | Admitting: Adult Health

## 2016-08-22 DIAGNOSIS — R634 Abnormal weight loss: Secondary | ICD-10-CM

## 2016-08-22 DIAGNOSIS — C099 Malignant neoplasm of tonsil, unspecified: Secondary | ICD-10-CM

## 2016-08-22 DIAGNOSIS — J029 Acute pharyngitis, unspecified: Secondary | ICD-10-CM

## 2016-08-22 DIAGNOSIS — R5383 Other fatigue: Secondary | ICD-10-CM

## 2016-08-22 LAB — T3: T3 TOTAL: 109 ng/dL (ref 71–180)

## 2016-08-22 LAB — T4, FREE: FREE T4: 1.14 ng/dL (ref 0.82–1.77)

## 2016-08-22 MED ORDER — PREDNISONE 20 MG PO TABS: 20.0000 mg | ORAL_TABLET | Freq: Every day | ORAL | 0 refills | Status: DC

## 2016-08-22 NOTE — Progress Notes (Addendum)
CLINIC:  Survivorship  REASON FOR VISIT:  Routine follow-up for head & neck cancer & to address post-treatment acute survivorship needs.   BRIEF ONCOLOGIC HISTORY:    Cancer of tonsillar fossa ([place])   03/03/2016 Imaging    CT neck: Left jugulodigastric adenopathy correlating with palpable complaint.  Tonsils symmetrically prominent for age.       03/27/2016 Initial Biopsy    Left neck mass (consult slides), fine needle aspirate: Malignant cells present, favor squamous cell carcinoma.       04/07/2016 PET scan    Solitary (L) level 2 LN enlarged and exhibits malignant range FDG uptake.  Symmetric uptake in both tonsillar regions. No definite findings identified to suggest primary site of disease. No evidence of metastatic disease to chest, abdomen, pelvis, or bony structures.       04/14/2016 Procedure    Biopsy of left tonsil: (+) invasive squamous cell carcinoma, p16+.   Biopsies of nasopharynx x 4 (benign), base of tongue x 2 (benign), and left oropharynx (benign).       04/21/2016 Initial Diagnosis    Cancer of tonsillar fossa ([place])     05/22/2016 - 07/05/2016 Radiation Therapy    Tonsils and bilateral neck / 70 Gy in 35 fractions to gross disease, 63 Gy in 35 fractions to high risk nodal echelons, and 56 Gy in 35 fractions to intermediate risk nodal echelons        INTERVAL HISTORY:  Mr. 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    Overall, he has not been feeling very well. He feels like his pain is worse, particularly every 3rd day when his Fentanyl patches are due to be changed.  His current total dose of the Fentanyl patches is 37 g (25 mcg patch & 12 mcg patch).   He continues to require the Norco 7.5/325 every 3-4 hours consistently around-the-clock. He is trying a variety of foods, and the hardest foods for him are those that are temperature hot or spicy.  He is having trouble with any foods  that have high salt or vinegar content (like Gatorade, ketchup).  He also has new low back pain, which he is concerned about.    He tells me his fatigue is much worse than it was 2 weeks ago. He tells me he sleeps until 10 or 11:00 in the morning, which is very unusual for him.  He is falling asleep when there are visitors at his house.  He tells me he is "freezing cold" all the time. He overall feels very tired, which is new and worsened since his last visit 2 weeks ago. He has not increased his physical activity, because he is too fatigued.  He is concerned that his weight is continuing to decline. He tells me he is to pounds down from his previous visit here, but notices at home that he has lost more weight. He tells me the throat pain is his biggest barrier to being able to eat.  He is not having any trouble with swallowing the tablets. He pre-medicates before his meals, but this doesn't seem to help control his pain for the entire meal.  He is able to tolerate cold foods best.  He tells me he continues to have runny nose when he eats, which is annoying for him.   His bowels are moving better since increasing his bowel regimen with Senokot and Miralax.  He is having  bowel movements every other day.       -Pain: Sore throat; Fentanyl patch-37 mcg (25 mcg patch & 12 mcg patch); requiring Norco about 6+ times per day; new low back pain  -Diet/Taste changes: Soft foods; Worsening pain impairing his ability to eat as much.  -Swallowing: Pain with swallowing; saw Garald Balding, SLP on 07/21/16.   -Dentist: Saw Dr. Enrique Sack 08/07/16 for post-radiation therapy evaluation.  -Weight change:  LOSS ~ 2 lbs since 08/07/16 -Fatigue:  Worsening     ADDITIONAL REVIEW OF SYSTEMS:  ROS, per HPI    CURRENT MEDICATIONS:  Current Outpatient Prescriptions on File Prior to Visit  Medication Sig Dispense Refill  . lidocaine (XYLOCAINE) 2 % solution Mix 1 part 2% viscous lidocaine,1 part H2O. Swallow +/- swish  25mL of this mixture, 70min before meals and at bedtime, up to QID 100 mL 5  . ranitidine (ZANTAC) 150 MG capsule Take 150 mg by mouth as needed for heartburn.    . senna (SENOKOT) 8.6 MG TABS tablet Take 1-2 tablets PO daily as needed to prevent constipation 60 tablet 2  . sodium fluoride (FLUORISHIELD) 1.1 % GEL dental gel Instill one drop of gel per tooth space of fluoride tray. Place over teeth for 5 minutes. Remove. Spit out excess. Repeat nightly. 120 mL prn   No current facility-administered medications on file prior to visit.     ALLERGIES:  No Known Allergies  SOCIAL HISTORY:  Mr. Starr is married and lives with his wife in [place], [place].  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 He denies any tobacco use.  He drinks alcohol occasionally.    PHYSICAL EXAM:  Vitals:   08/21/16 1255  BP: 127/74  Pulse: 71  Resp: 18  Temp: 97.6 F (36.4 C)     Filed Weights   08/21/16 1255  Weight: 155 lb 4.8 oz (70.4 kg)   Weight Date  155 lb 4.8 oz (70.4 kg) 08/21/16  157 lb (71.2 kg) 08/07/16  155 lb 9.6 oz (70.6 kg) 07/17/16  164 lb 8 oz (74.6 kg) 07/03/16  166 lb (75.3 kg) 06/28/16  169 lb 14.4 oz (77.1 kg) 06/16/16  172 lb (78 kg) 06/12/16  174 lb 1.6 oz (78.971 kg).  Pre-treatment (RT consult date): 04/21/16   General: Male in no acute distress.  Unaccompanied today.  HEENT: Head is atraumatic and normocephalic. Conjunctivae clear without exudate.  Sclerae anicteric. Oral mucosa is pink and slightly dry; saliva ropey.  Tongue pink.  Posterior oropharynx is red with patchy mucositis, improved from previous; no evidence of thrush.   Lymph: No cervical, supraclavicular, or infraclavicular lymphadenopathy noted on palpation.   Neck: Left neck mass measuring about 1 cm, mobile; clinically improved from previous; Skin on neck is intact. Cardiovascular: Normal rate and rhythm. Respiratory: Clear to auscultation  bilaterally. Chest expansion symmetric without accessory muscle use; breathing non-labored. GI: Abdomen soft and round. Non-tender. Bowel sounds normoactive.  GU: Deferred.   Neuro/MSK: No focal deficits. Steady gait.  No spinal process tenderness on palpation.  Lumbar spine musculature tense. Psych: Normal mood and affect for situation. Extremities: No edema. Skin: Warm and dry.    LABORATORY DATA:  CBC Latest Ref Rng & Units 08/21/2016 03/12/2016 10/22/2012  WBC 4.0 - 10.3 10e3/uL 4.7 7.4 9.6  Hemoglobin 13.0 - 17.1 g/dL 12.8(L) 14.4 15.7  Hematocrit 38.4 - 49.9 % 36.8(L) 41.6 46.1  Platelets 140 -  400 10e3/uL 207 227 254   Results for MAXELL, MIGGINS (MRN [id number])   Ref. Range 08/21/2016 14:05  NEUT% Latest Ref Range: 39.0 - 75.0 % 75.7 (H)  Results for JAKYLAN, RHODD (MRN [id number])   Ref. Range 08/21/2016 14:05  LYMPH% Latest Ref Range: 14.0 - 49.0 % 13.0 (L)  Results for VENIAMIN, WEIGMAN (MRN [id number])   Ref. Range 08/21/2016 14:05  lymph# Latest Ref Range: 0.9 - 3.3 10e3/uL 0.6 (L)   CMP Latest Ref Rng & Units 08/21/2016 05/09/2016 03/12/2016  Glucose 70 - 140 mg/dl 93 - 118(H)  BUN 7.0 - 26.0 mg/dL 10.8 10.6 20  Creatinine 0.7 - 1.3 mg/dL 0.8 1.1 0.98  Sodium 136 - 145 mEq/L 139 - 138  Potassium 3.5 - 5.1 mEq/L 4.5 - 3.4(L)  Chloride 101 - 111 mmol/L - - 104  CO2 22 - 29 mEq/L 29 - 28  Calcium 8.4 - 10.4 mg/dL 9.9 - 9.4  Total Protein 6.4 - 8.3 g/dL 7.3 - -  Total Bilirubin 0.20 - 1.20 mg/dL 2.17(H) - -  Alkaline Phos 40 - 150 U/L 48 - -  AST 5 - 34 U/L 15 - -  ALT 0 - 55 U/L 12 - -   Results for NEKODA, GOLDWIRE (MRN [id number])   Ref. Range 08/21/2016 14:05  TSH Latest Ref Range: 0.320 - 4.172m(IU)/L   1.151  Triiodothyronine (T3) Latest Ref Range: 71 - 180 ng/dL 109  T4,Free(Direct) Latest Ref Range: 0.82 - 1.77 ng/dL 1.14    DIAGNOSTIC IMAGING:  None at this visit.    ASSESSMENT & PLAN:  Mr. Theys is a pleasant 40 y.o. 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  Patient presents to survivorship clinic today for routine follow-up after finishing treatment.   1. Cancer of the left tonsil:  Mr. Gurung is continuing to recover from definitive radiation therapy to his head & neck. PET scan will be done and results reviewed with patient by Dr. Isidore Moos in late 09/2016.  I encouraged Mr. Memon to call me with any questions or concerns before his next appointment at the cancer center.   2. Worsening fatigue: Mr. Kil's fatigue was initially improving and is now worsening, which is concerning.  He also endorses cold intolerance. We discussed that thyroid dysfunction is common in H&N cancer survivors, but it generally is not prevalent this early after completing treatment.  However, I will collect some basic lab work (CBC, CMET), as well as TSH, free T4, and T3 for further evaluation.    Addendum:  His CBC reveals mild anemia with H&H 12.8/36.8.  His H&H about 6 months ago was normal.  This decrease could certainly be related to his cancer diagnosis and subsequent recovery from treatment.  His total bilirubin is 2x above normal limits at 2.07, which could be secondary to hemolysis contributing to the mild anemia.  However, it could also be benign Gilbert's syndrome, with asymptomatic hyperbilirubinemia.  I will discuss his case with Dr. Alvy Bimler to get her recommendations.    *I discussed his case with Dr. Alvy Bimler. She recommended a trial of short course low-dose prednisone to help with the patient's symptoms, which I believe would be a great option for this patient.  Regarding his lab values, Dr. Alvy Bimler does not think his increased bilirubin is contributing to the anemia, given that the decreased hemoglobin is very mild at this point.  I will give the patient a call with these recommendations.    3. Pain: Given that his pain has worsened  since his last visit and is impairing his ability to consume adequate nutrition & hydration, we will increase his Fentanyl patch to 50 mcg today.  He was provided a new prescription for Fentanyl 50 mcg patches.  He was also given a refill prescription of the Norco 7.5/325, but with decreased frequency (every 4 hours instead of every 3 hours prn) since we are increasing the Fentanyl patch.   He understands our goal is work on decreasing his pain medications, but we must do so slowly to best control his pain and aid in his healing.  He really wants to "get off all of these medications", but understands that it will take more time.  I will see him back in 2 weeks and we will try to decrease him back down to 37 mcg.   Prescriptions given today:  *Fentanyl 50 mcg, top Q72H, #5, no refills *Norco 7.5/325 mg po Q4Hprn, #120, no refills.   See below for patient's [place] Controlled Substance Registry data.  It is important for this patient to continue acute pain management given his H&N cancer diagnosis and treatment.      4. New low back pain: Based on his clinical symptoms, his low back pain appears to be musculoskeletal in nature.  I do not think this is related to his diagnosis of H&N cancer.  He has no urinary symptoms to suggest a possible kidney etiology of the low back pain. I encouraged him to try conservative symptom management with heating pad and/or get a professional massage to help alleviate the pain.  The muscle tension could also be secondary to his uncontrolled pain, which may improve with better overall pain control.  Will certainly continue to monitor.    5. 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 Since his last visit, he has increased his bowel regimen to include both Senokot and Miralax. He is now having bowel movements every other day, which is appropriate.  He understands that with the increase in opiate dose, he may  have worsening constipation. Recommended he titrate his bowel regimen medications as needed, with the goal of having a bowel movement every day or every other day.    6. Protein-calorie malnutrition: Mr. Vandegriff has lost about 2 lbs since his last visit.  Pain is the biggest barrier to him being able to consume adequate nutrition at this time.  We discussed the importance of maintaining adequate caloric and protein intake as he continues to heal.  We discussed that he could try Pedialyte, as this may have a little less sodium and thus be less caustic to his mucositis pain (since he could not tolerate Gatorade).   7. Radiation-induced mucositis/esophagitis: We discussed that it is not uncommon for head and neck cancer patients to experience mucositis and esophagitis as a result of therapy. Clinically, the oral mucositis is improved on exam today. Maintain current regimen.      Dispo:  -Return to cancer center to see Survivorship NP in 2 weeks to follow-up on symptoms.    A total of 35 minutes was spent in the face-to-face care of this patient, with greater than 50% of that time spent in counseling and care-coordination.   Mike Craze, NP [place] [phone]

## 2016-08-22 NOTE — Telephone Encounter (Signed)
I called and spoke with [name] regarding his lab studies from yesterday.  I shared with him that overall his labs look great.  His bilirubin is elevated and he is mildly anemic, but these could be secondary to his cancer diagnosis and treatment.    We discussed the option of doing low-dose prednisone for the next 2 weeks to see if this improves his pain and improves his appetite.  This may also help give him more energy as well, which would be beneficial as well.  He voiced understanding and agreed with above plan of care.   Prednisone 20 mg once daily, #15, no refills [facility]-prescribed [facility] [place].  Encouraged him to call me with any questions or concerns.  I will see him in about 2 weeks for follow-up visit, but will touch base with him next week via phone to check on him.  He voiced appreciation for the call.   Mike Craze, NP [place] [phone]

## 2016-09-01 ENCOUNTER — Telehealth: Payer: Self-pay | Admitting: *Deleted

## 2016-09-01 NOTE — Telephone Encounter (Signed)
Called patient to f/u to see if pain level is better since medication has been increased. Pt said pain level was much better. Pt has 4 more Prednisone to take. Pt will be here on 09/04/16. Message to be fwd to G.Dawson,NP.

## 2016-09-04 ENCOUNTER — Ambulatory Visit (HOSPITAL_BASED_OUTPATIENT_CLINIC_OR_DEPARTMENT_OTHER): Payer: Managed Care, Other (non HMO) | Admitting: Adult Health

## 2016-09-04 ENCOUNTER — Encounter: Payer: Self-pay | Admitting: Adult Health

## 2016-09-04 DIAGNOSIS — C090 Malignant neoplasm of tonsillar fossa: Secondary | ICD-10-CM | POA: Diagnosis not present

## 2016-09-04 DIAGNOSIS — R5383 Other fatigue: Secondary | ICD-10-CM

## 2016-09-04 DIAGNOSIS — J029 Acute pharyngitis, unspecified: Secondary | ICD-10-CM

## 2016-09-04 DIAGNOSIS — C099 Malignant neoplasm of tonsil, unspecified: Secondary | ICD-10-CM

## 2016-09-04 MED ORDER — FENTANYL 50 MCG/HR TD PT72: 50.0000 ug | MEDICATED_PATCH | TRANSDERMAL | 0 refills | Status: DC

## 2016-09-04 MED ORDER — PREDNISONE 10 MG PO TABS: 10.0000 mg | ORAL_TABLET | Freq: Every day | ORAL | 0 refills | Status: DC

## 2016-09-04 MED ORDER — HYDROCODONE-ACETAMINOPHEN 7.5-325 MG PO TABS: 1.0000 | ORAL_TABLET | ORAL | 0 refills | Status: DC | PRN

## 2016-09-04 MED ORDER — PREDNISONE 20 MG PO TABS: 20.0000 mg | ORAL_TABLET | Freq: Every day | ORAL | 0 refills | Status: DC
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
## 2016-09-14 ENCOUNTER — Telehealth: Payer: Self-pay | Admitting: *Deleted

## 2016-09-14 NOTE — Telephone Encounter (Signed)
Called patient to inform of Pet Scan for 10-10-16- arrival time - 9:30 am , pt. To be NPO -6 hrs. Prior to test @ [facility], and his fu visit with Dr. Isidore Moos on 10-11-16,  lvm for a return call

## 2016-09-18 ENCOUNTER — Encounter: Payer: Self-pay | Admitting: Adult Health

## 2016-09-18 ENCOUNTER — Ambulatory Visit (HOSPITAL_BASED_OUTPATIENT_CLINIC_OR_DEPARTMENT_OTHER): Payer: Managed Care, Other (non HMO) | Admitting: Adult Health

## 2016-09-18 VITALS — BP 98/53 | HR 78 | Temp 97.8°F | Resp 16 | Ht 71.0 in | Wt 152.4 lb

## 2016-09-18 DIAGNOSIS — C090 Malignant neoplasm of tonsillar fossa: Secondary | ICD-10-CM

## 2016-09-18 DIAGNOSIS — R5383 Other fatigue: Secondary | ICD-10-CM | POA: Diagnosis not present

## 2016-09-18 MED ORDER — FENTANYL 12 MCG/HR TD PT72: 12.5000 ug | MEDICATED_PATCH | TRANSDERMAL | 0 refills | Status: DC

## 2016-09-18 MED ORDER — FENTANYL 25 MCG/HR TD PT72: 25.0000 ug | MEDICATED_PATCH | TRANSDERMAL | 0 refills | Status: DC

## 2016-09-18 NOTE — Progress Notes (Signed)
CLINIC:  Survivorship  REASON FOR VISIT:  Routine follow-up for head & neck cancer & to address post-treatment acute survivorship needs.   BRIEF ONCOLOGIC HISTORY:    Cancer of tonsillar fossa ([place])   03/03/2016 Imaging    CT neck: Left jugulodigastric adenopathy correlating with palpable complaint.  Tonsils symmetrically prominent for age.       03/27/2016 Initial Biopsy    Left neck mass (consult slides), fine needle aspirate: Malignant cells present, favor squamous cell carcinoma.       04/07/2016 PET scan    Solitary (L) level 2 LN enlarged and exhibits malignant range FDG uptake.  Symmetric uptake in both tonsillar regions. No definite findings identified to suggest primary site of disease. No evidence of metastatic disease to chest, abdomen, pelvis, or bony structures.       04/14/2016 Procedure    Biopsy of left tonsil: (+) invasive squamous cell carcinoma, p16+.   Biopsies of nasopharynx x 4 (benign), base of tongue x 2 (benign), and left oropharynx (benign).       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    Overall, he tells me he is feeling great. He did notice a decrease in his energy levels and appetite with the decrease in prednisone dose from 20 mg to 10 mg.  But, he states that he feels like he is ready to further decrease his pain medications and come off of the steroids.    He is eating a large variety of foods. He is exercising, doing a little bit of cardio and light weightlifting. He is working to regain his strength and stamina.   He is looking forward to  the [place] cruise with his wife in a few weeks.       -Pain: Much improved from previous; only takes Norco 3-4x/day.  Currently on Fentanyl 50 mcg patch.  -Diet/Taste changes: Regular diet    -Swallowing: Pain with swallowing improving significantly; saw Garald Balding, SLP on 07/21/16.   -Dentist: Saw Dr. Enrique Sack 08/07/16 for post-radiation therapy evaluation.  -Weight change:  LOSS ~ 4 lbs since 09/04/16 -Fatigue: Improving with exercise.     ADDITIONAL REVIEW OF SYSTEMS:  ROS, per HPI    CURRENT MEDICATIONS:  Current Outpatient Prescriptions on File Prior to Visit  Medication Sig Dispense Refill  . fentaNYL (DURAGESIC - DOSED MCG/HR) 50 MCG/HR Place 1 patch (50 mcg total) onto the skin every 3 (three) days. 5 patch 0  . HYDROcodone-acetaminophen (NORCO) 7.5-325 MG tablet Take 1 tablet by mouth every 4 (four) hours as needed for moderate pain. 120 tablet 0  . lidocaine (XYLOCAINE) 2 % solution Mix 1 part 2% viscous lidocaine,1 part H2O. Swallow +/- swish 3mL of this mixture, 75min before meals and at bedtime, up to QID (Patient not taking: Reported on 09/04/2016) 100 mL 5  . predniSONE (DELTASONE) 10 MG tablet Take 1 tablet (10 mg total) by mouth daily with breakfast. 15 tablet 0  . predniSONE (DELTASONE) 20 MG tablet Take 1 tablet (20 mg total) by  mouth daily with breakfast. 7 tablet 0  . ranitidine (ZANTAC) 150 MG capsule Take 150 mg by mouth as needed for heartburn.    . senna (SENOKOT) 8.6 MG TABS tablet Take 1-2 tablets PO daily as needed to prevent constipation 60 tablet 2  . sodium fluoride (FLUORISHIELD) 1.1 % GEL dental gel Instill one drop of gel per tooth space of fluoride tray. Place over teeth for 5 minutes. Remove. Spit out excess. Repeat nightly. 120 mL prn   No current facility-administered medications on file prior to visit.     ALLERGIES:  No Known Allergies  SOCIAL HISTORY:  [name] is married and lives with his wife in [place], [place].  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 He denies any tobacco use.  He drinks alcohol occasionally.    PHYSICAL EXAM:  Vitals:   09/18/16 1359  BP: (!) 98/53  Pulse: 78  Resp: 16  Temp: 97.8 F (36.6 C)     Filed Weights   09/18/16 1359  Weight: 152 lb 6.4 oz (69.1 kg)   Weight Date  152 lb 6.4 oz (69.1 kg) 09/18/16  156 lb 11.2 oz (71.1 kg) 09/04/16  155 lb 4.8 oz (70.4 kg) 08/21/16  157 lb (71.2 kg) 08/07/16  155 lb 9.6 oz (70.6 kg) 07/17/16  164 lb 8 oz (74.6 kg) 07/03/16  166 lb (75.3 kg) 06/28/16  169 lb 14.4 oz (77.1 kg) 06/16/16  172 lb (78 kg) 06/12/16  174 lb 1.6 oz (78.971 kg).  Pre-treatment (RT consult date): 04/21/16   General: Male in no acute distress.  Unaccompanied today.  HEENT: Head is atraumatic and normocephalic. Conjunctivae clear without exudate.  Sclerae anicteric. Oral mucosa is pink and slightly dry; saliva ropey.  Tongue pink.  Posterior oropharynx is red with improved mucositis, improved from previous; no evidence of thrush.     Lymph: No cervical, supraclavicular, or infraclavicular lymphadenopathy noted on palpation.   Neck: No palpable neck mass on exam (mass resolved since previous exam); Skin on neck is intact. Cardiovascular: Normal rate and rhythm. Respiratory: Clear to auscultation bilaterally. Chest expansion symmetric without accessory muscle use; breathing non-labored. GI: Abdomen soft and round. Non-tender. Bowel sounds normoactive.  GU: Deferred.   Neuro/MSK: No focal deficits. Steady gait.  Psych: Normal mood and affect for situation. Extremities: No edema. Skin: Warm and dry.    LABORATORY DATA:  None for this visit.    DIAGNOSTIC IMAGING:  None at this visit.     ASSESSMENT & PLAN:  [name] is a pleasant 40 y.o. 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  Patient presents to survivorship clinic today for routine follow-up after finishing treatment.   1. Cancer of the left tonsil:  [name] is continuing to recover from definitive radiation therapy to his head & neck. PET scan will be done and results reviewed with patient by Dr. Isidore Moos in late 09/2016.  I encouraged [name] to call me with any questions or concerns before his next appointment at the cancer center.   2. Fatigue/Appetite, improving: His appetite and energy levels were better on the 20 mg of Prednisone than they currently are on the 10 mg.  He is not interested in going back up on steroid dose, which is reasonable at this point.  We will plan on having him complete 7 more days of the 10 mg prednisone and then stop. He is exercising a bit now, but not too strenuously.  I encouraged him to increase his caloric intake given his energy expenditure with exercise.  He understands how important his nutrition is in managing his fatigue, healing, and recovery from radiation therapy.    3. Pain, improving: His pain is well-controlled and he feels ready to wean down on his long-acting pain medication, which is appropriate.  He only requires the Norco 3-4x per day, which is reasonable to wean down on his Fentanyl patch dose.  I will provide him with a month's worth of the Fentanyl 25 mcg and 12 mcg patches (total dose 37 mcg).  When he sees Dr. Isidore Moos at the end of the month for his PET scan results, she can further wean his patch dose, if appropriate.  I would suggest decreasing to 25 mcg at that time for 2 weeks, as he had significant rebound pain with his last dose decrease.  He tells me he has plenty of his Norco for now and did not need a refill.    Prescriptions given today:  *Fentanyl 25 mcg, top Q72H, #10, no refills *Fentanyl 12 mcg, top Q72H, #10, no refills     See below for patient's [place] Controlled Substance Registry data.  It is important for this patient to  continue acute pain management given his H&N cancer diagnosis and treatment.      4. 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    5. Protein-calorie malnutrition, improving: [name] has lost about 4 lbs since his last visit 2 weeks ago, but he tells me his weight has been fluctuating +/- 2 lbs at home. He weighs himself diligently twice daily and he reports his weight has been stable.  Reinforced the importance of increased calories and protein, particularly if he is now exercising. He will need even more calories to prevent further weight loss. He understands this rationale and will try to add a shake or extra meal sometime during the day.  Encouraged him to continue to try new foods, as tolerated.       Dispo:  -Return to cancer center to see Dr. Isidore Moos in late 09/2016 to get the results of restaging PET scan.  Wished him well and encouraged him to contact me with any questions or concerns before his next visit.    A total of 20 minutes was spent in the face-to-face care of this patient, with greater than 50% of that time spent in counseling and care-coordination.   [name] Craze, NP [place] [phone]

## 2016-09-18 NOTE — Patient Instructions (Signed)
You're doing great! Call me with any questions or concerns.   Mike Craze, NP [place] [phone]

## 2016-09-20 ENCOUNTER — Ambulatory Visit: Payer: Managed Care, Other (non HMO) | Attending: Radiation Oncology

## 2016-10-10 ENCOUNTER — Other Ambulatory Visit: Payer: Self-pay | Admitting: Gastroenterology

## 2016-10-10 ENCOUNTER — Ambulatory Visit (HOSPITAL_COMMUNITY)
Admission: RE | Admit: 2016-10-10 | Discharge: 2016-10-10 | Disposition: A | Discharge status: Home / Self Care | Payer: Managed Care, Other (non HMO) | Source: Ambulatory Visit | Attending: Radiation Oncology | Admitting: Radiation Oncology

## 2016-10-10 DIAGNOSIS — C090 Malignant neoplasm of tonsillar fossa: Secondary | ICD-10-CM | POA: Insufficient documentation

## 2016-10-10 DIAGNOSIS — K8020 Calculus of gallbladder without cholecystitis without obstruction: Secondary | ICD-10-CM | POA: Insufficient documentation

## 2016-10-10 LAB — GLUCOSE, CAPILLARY: GLUCOSE-CAPILLARY: 97 mg/dL (ref 65–99)

## 2016-10-10 MED ORDER — FLUDEOXYGLUCOSE F - 18 (FDG) INJECTION
7.5100 | Freq: Once | INTRAVENOUS | Status: AC | PRN
  Administered 2016-10-10: 7.51 via INTRAVENOUS

## 2016-10-11 ENCOUNTER — Encounter: Payer: Self-pay | Admitting: *Deleted

## 2016-10-11 ENCOUNTER — Ambulatory Visit
Admission: RE | Admit: 2016-10-11 | Discharge: 2016-10-11 | Disposition: A | Discharge status: Home / Self Care | Payer: Managed Care, Other (non HMO) | Source: Ambulatory Visit | Attending: Radiation Oncology | Admitting: Radiation Oncology

## 2016-10-11 ENCOUNTER — Encounter: Payer: Self-pay | Admitting: Radiation Oncology

## 2016-10-11 DIAGNOSIS — C090 Malignant neoplasm of tonsillar fossa: Secondary | ICD-10-CM

## 2016-10-11 DIAGNOSIS — R070 Pain in throat: Secondary | ICD-10-CM | POA: Diagnosis not present

## 2016-10-11 DIAGNOSIS — Z79899 Other long term (current) drug therapy: Secondary | ICD-10-CM | POA: Diagnosis not present

## 2016-10-11 DIAGNOSIS — Z8589 Personal history of malignant neoplasm of other organs and systems: Secondary | ICD-10-CM | POA: Diagnosis not present

## 2016-10-11 DIAGNOSIS — R634 Abnormal weight loss: Secondary | ICD-10-CM

## 2016-10-11 DIAGNOSIS — Z08 Encounter for follow-up examination after completed treatment for malignant neoplasm: Secondary | ICD-10-CM | POA: Insufficient documentation

## 2016-10-11 DIAGNOSIS — M545 Low back pain: Secondary | ICD-10-CM | POA: Diagnosis not present

## 2016-10-11 DIAGNOSIS — Z923 Personal history of irradiation: Secondary | ICD-10-CM | POA: Insufficient documentation

## 2016-10-11 LAB — TSH: TSH: 1.451 m(IU)/L (ref 0.320–4.118)

## 2016-10-11 MED ORDER — FENTANYL 12 MCG/HR TD PT72: 12.5000 ug | MEDICATED_PATCH | TRANSDERMAL | 0 refills | Status: DC

## 2016-10-11 MED ORDER — FENTANYL 25 MCG/HR TD PT72: 25.0000 ug | MEDICATED_PATCH | TRANSDERMAL | 0 refills | Status: DC

## 2016-10-11 MED ORDER — HYDROCODONE-ACETAMINOPHEN 7.5-325 MG PO TABS: 1.0000 | ORAL_TABLET | Freq: Four times a day (QID) | ORAL | 0 refills | Status: DC | PRN

## 2016-10-11 NOTE — Progress Notes (Signed)
Oncology Nurse Navigator Documentation  Met with [name] and his wife during f/u with Dr. Isidore Moos.  He completed XRT for L tonsil carcinoma 07/05/16.  He arrives today for follow-up and discussion of PET completed yesterday. They voiced understanding of Dr. Pearlie Oyster report of excellent PET results, NED in treatment area or other areas of body. He voiced understanding of appropriateness to begin taper of Fentanyl, to increase short acting analgesics as needed. I discussed opportunity to attend [facility] and [facility] next April, encouraged them to attend. They understand I can be contacted with needs/concerns.  [name], RN, BSN, [place] [facility] Oncology Nurse [place] at [place] [phone]

## 2016-10-11 NOTE — Progress Notes (Signed)
A user error has taken place: encounter opened in error, closed for administrative reasons.

## 2016-10-11 NOTE — Progress Notes (Signed)
Radiation Oncology         [phone] ________________________________  Name: [name] MRN: [id number]  Date: 10/11/2016  DOB: 08/02/1976  Follow-Up Visit Note  CC: Donnie Coffin, MD  Jerrell Belfast, MD  Diagnosis and Prior Radiotherapy:       ICD-9-CM ICD-10-CM   1. Cancer of tonsillar fossa ([facility]) 146.1 C09.0    C09.0 Left tonsil squamous cell carcinoma, p16+ (and possibly right tonsil cancer) T1N1M0 Stage III  CHIEF COMPLAINT: I am here for my PET results  Narrative:  The patient returns today for routine follow-up of definitive radiation completed to his tonsils and bilateral neck on 07/05/16.  On review of systems, the patient reports right lower back pain, which worsens as the day goes on. He has tried a heating pad with some relief. He also reports throat pain which happens when eating and yawning. He experiences dry mouth when he first wakes up. The patient reports he is currently using 37.5 mcg Fentanyl patches as well as Norco 7.5/325 before meals, 2-3 times daily. He reports being frustrated with eating because of the pain.  No pain at rest. He feels like his throat "needs to relax" before he is able to eat. He also feels that he has had an increase in mucous production recently. The patient is not using a feeding tube. He drinks Boost occasionally, and feels that he is maintaining his weight. The patient denies smoking or chewing tobacco. He reports using daily fluoride trays. The patient has not had an ENT visit since his diagnosis. The patient reports trouble sleeping, which he says is caused by difficulty breathing while sleeping, though he is using a humidifier to try and alleviate this. He says he can breathe well through his nose but not through his mouth. His wife is accompanying him today, and reports hearing a noise in his throat while he is sleeping. He also reports noticing his voice has changed since completing radiation.        The patient and his wife were  joined today by [name], Nurse Navigator.  ALLERGIES:  has No Known Allergies.  Meds: Current Outpatient Prescriptions  Medication Sig Dispense Refill  . fentaNYL (DURAGESIC - DOSED MCG/HR) 12 MCG/HR Place 1 patch (12.5 mcg total) onto the skin every 3 (three) days. 10 patch 0  . fentaNYL (DURAGESIC) 25 MCG/HR patch Place 1 patch (25 mcg total) onto the skin every 3 (three) days. 10 patch 0  . HYDROcodone-acetaminophen (NORCO) 7.5-325 MG tablet Take 1 tablet by mouth every 4 (four) hours as needed for moderate pain. 120 tablet 0  . ranitidine (ZANTAC) 150 MG capsule Take 150 mg by mouth as needed for heartburn.    . senna (SENOKOT) 8.6 MG TABS tablet Take 1-2 tablets PO daily as needed to prevent constipation 60 tablet 2  . sodium fluoride (FLUORISHIELD) 1.1 % GEL dental gel Instill one drop of gel per tooth space of fluoride tray. Place over teeth for 5 minutes. Remove. Spit out excess. Repeat nightly. 120 mL prn  . lidocaine (XYLOCAINE) 2 % solution Mix 1 part 2% viscous lidocaine,1 part H2O. Swallow +/- swish 76mL of this mixture, 63min before meals and at bedtime, up to QID (Patient not taking: Reported on 10/11/2016) 100 mL 5   No current facility-administered medications for this encounter.     Physical Findings: The patient is in no acute distress. Patient is alert and oriented. Wt Readings from Last 3 Encounters:  10/11/16 154 lb (69.9 kg)  09/18/16 152 lb 6.4 oz (69.1 kg)  09/04/16 156 lb 11.2 oz (71.1 kg)    height is 5\' 11"  (1.803 m) and weight is 154 lb (69.9 kg). His temperature is 98.3 F (36.8 C). His blood pressure is 123/76 and his pulse is 72. His oxygen saturation is 100%. .  General: Alert and oriented, in no acute distress HEENT:  Oropharynx and oral cavity are clear. Some salivary production, but his mouth is still slightly dry. No evidence of thrush. Neck: Neck is supple, no palpable cervical or supraclavicular masses. Heart: Regular in rate and rhythm with no  murmurs. Chest: Clear to auscultation bilaterally. Abdomen: Soft, nontender, nondistended, with no rigidity or guarding. No tenderness to palpation along the right flank - palpation helps the discomfort, actually. Lymphatics: see Neck Exam Skin: No concerning lesions. Skin over the neck is well healed. Neurologic: No obvious focalities. Speech is fluent. Coordination is intact. Psychiatric: Judgment and insight are intact. Affect is appropriate.  Lab Findings: Lab Results  Component Value Date   WBC 4.7 08/21/2016   HGB 12.8 (L) 08/21/2016   HCT 36.8 (L) 08/21/2016   MCV 86.6 08/21/2016   PLT 207 08/21/2016   CMP     Component Value Date/Time   NA 139 08/21/2016 1405   K 4.5 08/21/2016 1405   CL 104 03/12/2016 0345   CL 103 10/22/2012 0146   CO2 29 08/21/2016 1405   GLUCOSE 93 08/21/2016 1405   BUN 10.8 08/21/2016 1405   CREATININE 0.8 08/21/2016 1405   CALCIUM 9.9 08/21/2016 1405   PROT 7.3 08/21/2016 1405   ALBUMIN 4.2 08/21/2016 1405   AST 15 08/21/2016 1405   ALT 12 08/21/2016 1405   ALKPHOS 48 08/21/2016 1405   BILITOT 2.17 (H) 08/21/2016 1405   GFRNONAA >60 03/12/2016 0345   GFRNONAA >60 10/22/2012 0146   GFRAA >60 03/12/2016 0345   GFRAA >60 10/22/2012 0146    Lab Results  Component Value Date   TSH 1.451 10/11/2016    Radiographic Findings: images reviewed by me Nm Pet Image Restag (ps) Skull Base To Thigh  Result Date: 10/10/2016 CLINICAL DATA:  Subsequent treatment strategy for restaging of tonsillar cancer. Status post left-sided tonsillectomy 04/14/2016. EXAM: NUCLEAR MEDICINE PET SKULL BASE TO THIGH TECHNIQUE: 7.5 mCi F-18 FDG was injected intravenously. Full-ring PET imaging was performed from the skull base to thigh after the radiotracer. CT data was obtained and used for attenuation correction and anatomic localization. FASTING BLOOD GLUCOSE:  Value: 97 mg/dl COMPARISON:  04/07/2016.  Neck CT 02/22/2016. FINDINGS: NECK Resolution of previously  described left-sided level 2 hypermetabolic adenopathy. No new sites of cervical nodal or mucosal hypermetabolism identified. CHEST No areas of abnormal hypermetabolism. No thoracic adenopathy. No lobe suspicious pulmonary nodule or mass. Heart size upper normal. ABDOMEN/PELVIS No abdominopelvic parenchymal or nodal hypermetabolism. Stone filled gallbladder. Normal adrenal glands. Fat containing central pelvic anterior wall hernia. Unchanged. SKELETON No abnormal marrow activity. No focal osseous lesion. IMPRESSION: 1. Resolved left-sided hypermetabolic cervical adenopathy. 2. No evidence of residual or recurrent disease. 3. Cholelithiasis. Electronically Signed   By: Abigail Miyamoto M.D.   On: 10/10/2016 11:06    Impression/Plan:    1) Head and Neck Cancer Status: NED - complete response on PET to RT  2) Nutritional Status: very good PEG tube: none   3) Risk Factors: p16+, abstaining from tobacco, ETOH in moderation.  4) Swallowing: functional; I encouraged the patient to continue his swallowing exercises.  5) Dental: Encouraged to continue  regular followup with dentistry, and dental hygiene including fluoride rinses. I encouraged the patient to purchase and use a tongue scraper.  6) Thyroid function: WNL Lab Results  Component Value Date   TSH 1.451 10/11/2016    7) Other: Tapering pain meds.  Will go down from 37.5 mcg to 25 mcg Fentanyl for two weeks, and then move down to 12.5 mcg for the next two weeks. I will refill the patient's Norco, which he may continue to take before all of his meals, up to x 4 daily. Additionally, I encouraged the patient to try using his Lidocaine rinse again to see if he can tolerate it better in order to alleviate pain during eating.  Back pain is benign in nature and nothing concerning upon my personal review of PET scan  8) I would like the patient to call me in a month if he feels that he needs a refill of the Fentanyl patches.  Discussed importance of  tapering and preventing dependency. Try mild foods and lidocaine to help with throat/mouth  pain during eating. I will refer the patient to PT to discuss his lower back pain. Additionally, I will refer the patient to ENT in 1 month. Follow-up with survivorship in 4 months.  He needs to be seen q 50mo for first year after RT.  So, ENT appt 3 mo after survivorship, and then another appt to be with me, 3 mo after ENT (  ENT ---> survivorship ---> ENT ---> Conchetta Lamia.)  I spent 35 minutes face to face with patient, over 50% on counseling and care coordination.  _____________________________________   Eppie Gibson, MD  This document serves as a record of services personally performed by Eppie Gibson, MD. It was created on her behalf by Maryla Morrow, a trained medical scribe. The creation of this record is based on the scribe's personal observations and the provider's statements to them. This document has been checked and approved by the attending provider.

## 2016-10-12 ENCOUNTER — Other Ambulatory Visit: Payer: Self-pay | Admitting: Radiation Oncology

## 2016-10-12 DIAGNOSIS — C090 Malignant neoplasm of tonsillar fossa: Secondary | ICD-10-CM

## 2016-10-13 ENCOUNTER — Telehealth: Payer: Self-pay | Admitting: *Deleted

## 2016-10-13 NOTE — Telephone Encounter (Signed)
Oncology Nurse Navigator Documentation  Per Dr. Pearlie Oyster guidance, called [facility] to arrange appointment.  Spoke with Michel Bickers, requested patient be contacted and routine follow-up appt arranged with Dr. Wilburn Cornelia in 1 month.    She verbalized understanding.  [name], RN, BSN, [place] [facility] Oncology Nurse [place] at [place] [phone]

## 2016-10-13 NOTE — Telephone Encounter (Signed)
CALLED PATIENT TO INFORM OF PT APPT. FOR 10-18-16 - ARRIVAL TIME- 3 PM @ [facility]  AND HIS APPT. WITH GRETCHEN DAWSON ON 02-09-17 @ 3 PM, SPOKE WITH PATIENT AND HE IS AWARE OF THESE APPTS.

## 2016-10-16 HISTORY — PX: NECK SURGERY: SHX720

## 2016-10-18 ENCOUNTER — Ambulatory Visit: Payer: Managed Care, Other (non HMO) | Attending: Radiation Oncology | Admitting: Physical Therapy

## 2016-10-18 DIAGNOSIS — M545 Low back pain, unspecified: Secondary | ICD-10-CM

## 2016-10-18 DIAGNOSIS — I890 Lymphedema, not elsewhere classified: Secondary | ICD-10-CM | POA: Diagnosis not present

## 2016-10-19 NOTE — Therapy (Signed)
[place], [place], [address] Phone: [phone]   Fax:  [phone]  Physical Therapy Evaluation  Patient Details  Name: [name] MRN: [id number] Date of Birth: [birth year] Referring Provider: Dr. Eppie Gibson  Encounter Date: 10/18/2016      PT End of Session - 10/19/16 0819    Visit Number 1   Number of Visits 7   Date for PT Re-Evaluation 12/13/16   PT Start Time 1520   PT Stop Time 1608   PT Time Calculation (min) 48 min   Activity Tolerance Patient tolerated treatment well   Behavior During Therapy [facility] for tasks assessed/performed      Past Medical History:  Diagnosis Date  . GERD (gastroesophageal reflux disease)   . Mitral valve prolapse   . Weight loss     Past Surgical History:  Procedure Laterality Date  . ELBOW SURGERY Right   . KNEE SURGERY Left   . NASOPHARYNGOSCOPY Left 04/14/2016   Procedure: BASE NASAL NASOPHARYNX;  Surgeon: Jerrell Belfast, MD;  Location: [facility];  Service: ENT;  Laterality: Left;  . TONSILLECTOMY Left 04/14/2016   Procedure: TONSILLECTOMY AND BIOPSY OF TONGUE ;  Surgeon: Jerrell Belfast, MD;  Location: [facility];  Service: ENT;  Laterality: Left;    There were no vitals filed for this visit.       Subjective Assessment - 10/18/16 1523    Subjective Lymphedema in the neck and unrelieved right low back pain.  Uncomfortable sitting in a car, at a desk, standing; lying on a heating pad helps it subside.   Pertinent History Presented with left neck swelling to ED at end of May; had been fatigued prior to that.  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  PET 04/07/16 showed solitary left level 2 lymph node enlarged and hypermetabolic.  Left tonsillectomy 04/14/16.  XRT treatment completed 07/05/16.  Lost about 30 lbs.  Recent PET scan was clear.  Just started back to work yesterday.  Did go to the gym when he was on the steroid, but stopped because he was losing weight.  Has started back with some UE weights. Not doing cardio now, except walking 20 minutes on a treadmill but trying not to break a sweat because of the weight loss. h/o L1 fracture about 2004, but hasn't had any problems with that.   Patient Stated Goals get rid of neck swelling and get some relief to his back pain   Currently in Pain? Yes   Pain Score 5    Pain Location Back   Pain Orientation Right;Lower   Pain Descriptors / Indicators Aching;Throbbing;Constant   Pain Onset More than a month ago   Aggravating Factors  sitting, standing, walking; as the day goes on   Pain Relieving Factors lying with a heating pad; better early in the day            [facility] PT Assessment - 10/19/16 0001      Assessment   Medical Diagnosis left tonsil squamous cell carcinoma, stage III with lymph node involvement   Onset Date/Surgical Date 04/14/16  left tonsillectomy   Hand Dominance Right   Prior Therapy none     Precautions   Precautions Other (comment)   Precaution Comments cancer precautions     Restrictions   Weight Bearing Restrictions No     Home Environment  Living Environment Private residence   Living Arrangements Spouse/significant other   Type of [place] Two level     Prior Function   Level of Independence Independent   Vocation Full time employment  currently using his vacation time and not working   [employer] a [employer], ensuring events and programs run smoothly; only occasional heavy lifting; lots of walking; also is a sommelier   Leisure until onset of symptoms, worked out at gym 5 days a week, including elliptical, free weights, and resistance exercises     Cognition   Overall Cognitive Status Within Functional Limits for tasks assessed     Posture/Postural Control    Posture/Postural Control Postural limitations   Postural Limitations Forward head;Rounded Shoulders     AROM   Overall AROM  Deficits   Overall AROM Comments standing active trunk flexion WFL, extension 50% limited.  Pt. has pain with these movements.     Palpation   Palpation comment anterior neck swelling is fairly soft; right low back muscles are moderately tight; no significant spinal malalignment     Special Tests    Special Tests Lumbar   Lumbar Tests other     other   Comments repeated flexion in standing x 7 increased intensity of right LBP; repeated extension was painful during but decreased intensity slightly.  After prone on elbows for a minute or two, patient reported pain at about 1/2 the intensity it had been.             LYMPHEDEMA/ONCOLOGY QUESTIONNAIRE - 10/19/16 0815      Head and Neck   4 cm superior to sternal notch around neck 38.7 cm   6 cm superior to sternal notch around neck 38.8 cm   8 cm superior to sternal notch around neck 38.9 cm   Other 40 at 10 cm superior to sternal notch   Other has lost 30 lbs. since treatment started                [facility] Adult PT Treatment/Exercise - 10/19/16 0001      Self-Care   Self-Care Other Self-Care Comments   Other Self-Care Comments  Fashioned foam chip pack in stockinette for neck compression and educated patient in its use.  Briefly discussed availability of manufactured compression garments and Flexitouch pump.                PT Education - 10/19/16 0818    Education provided Yes   Education Details use of foam chip pack 2-4 hours a day for neck compression; availability of manufactured compression garments and Flexitouch pump; also, for now, to try to avoid rounding his back to see if that relieves back pain symptoms   Person(s) Educated Patient   Methods Explanation;Demonstration;Handout  Flexitouch info handout   Comprehension Verbalized understanding           Short Term Clinic  Goals - 10/19/16 [id number]      CC Short Term Goal  #1   Title Patient will report at least 25% decrease in right low back pain.   Time 2   Period Weeks   Status New     CC Short Term Goal  #2   Title Pt. will be knowledgeable about equipment (garments, pump) available for lymphedema home management.   Time 2   Period Weeks   Status New             Long Term Clinic Goals -  10/19/16 0836      CC Long Term Goal  #1   Title Patient will report at least 60% decrease in right low back pain for improved ADLs and function in his job.   Time 4   Period Weeks   Status New     CC Long Term Goal  #2   Title Pt. will be independent in self-manual lymph drainage for neck.   Time 4   Period Weeks   Status New     CC Long Term Goal  #3   Title Knowledgeable about tools available for home management of neck lymphedema.   Time 4   Period Weeks   Status New     CC Long Term Goal  #4   Title Independent in HEP for minimizing right low back pain.   Time 4   Period Weeks   Status New         Head and Neck Clinic Goals - 04/25/16 1129      Patient will be able to verbalize understanding of a home exercise program for cervical range of motion, posture, and walking.    Status Achieved     Patient will be able to verbalize understanding of proper sitting and standing posture.    Status Achieved     Patient will be able to verbalize understanding of lymphedema risk and availability of treatment for this condition.    Status Achieved           Plan - 10/19/16 0819    Clinical Impression Statement Pleasant gentleman who is now 3 months s/p XRT for left tonsillar cancer.  He has developed lymphedema in his neck; though he lost 30 lbs. since start of treatment, his neck measurements have mostly increased compared to pre-treatment measurements.  He has developed right low back pain.  There is some effect of repeated trunk flexion (increasing his pain) and extension  (decreasing his pain), but it seems his pain may simply be muscular in nature.  Eval is moderate in complexity because of personal factor of patient just returning to work yesterday in a job that aggravates back pain and also leaves him very fatigued; status is evolving due to recent onset of lymphedema that is a progressive condition.   Rehab Potential Excellent   Clinical Impairments Affecting Rehab Potential completed XRT to neck in September 2017   PT Frequency 1x / week  Patient can't come more often, in part due to finances   PT Duration 6 weeks   PT Treatment/Interventions ADLs/Self Care Home Management;Cryotherapy;Electrical Stimulation;Moist Heat;DME Instruction;Therapeutic exercise;Patient/family education;Manual techniques;Manual lymph drainage;Compression bandaging;Passive range of motion;Taping   PT Next Visit Plan See if patient noted any benefit from avoiding trunk flexion/rounding. Begin manual lymph drainage for neck and instructing patient in same.  Try/instruct patient in knee to chest, prone press-ups, and lower trunk rotation exercises. Consider soft tissue work to low back as well as modalities to same.  Assess benefit of foam chip pack and discuss manufactured compression garment options.  See if patient isinterested in pursuing Flexitouch.   Consulted and Agree with Plan of Care Patient      Patient will benefit from skilled therapeutic intervention in order to improve the following deficits and impairments:  Decreased activity tolerance, Decreased knowledge of precautions, Decreased knowledge of use of DME, Decreased range of motion, Increased edema, Pain, Increased muscle spasms  Visit Diagnosis: Lymphedema, not elsewhere classified - Plan: PT plan of care cert/re-cert  Acute right-sided low back pain  without sciatica - Plan: PT plan of care cert/re-cert     Problem List Patient Active Problem List   Diagnosis Date Noted  . Cancer of tonsillar fossa ([facility]) 04/21/2016   . Mass of left side of neck 04/14/2016  . 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 Neck mass 04/14/2016    SALISBURY,DONNA 10/19/2016, 8:40 AM  [place], [place], [address] Phone: [phone]   Fax:  [phone]  Name: [name] MRN: [id number] Date of Birth: Dec 10, 1975  Serafina Royals, PT 10/19/16 8:41 AM

## 2016-12-04 ENCOUNTER — Ambulatory Visit: Payer: Self-pay

## 2016-12-04 ENCOUNTER — Ambulatory Visit: Payer: Managed Care, Other (non HMO) | Attending: Radiation Oncology

## 2016-12-15 ENCOUNTER — Encounter: Payer: Self-pay | Admitting: Radiation Oncology

## 2016-12-15 ENCOUNTER — Ambulatory Visit
Admission: RE | Admit: 2016-12-15 | Discharge: 2016-12-15 | Disposition: A | Discharge status: Home / Self Care | Payer: Managed Care, Other (non HMO) | Source: Ambulatory Visit | Attending: Radiation Oncology | Admitting: Radiation Oncology

## 2016-12-15 ENCOUNTER — Other Ambulatory Visit: Payer: Self-pay | Admitting: Radiation Therapy

## 2016-12-15 DIAGNOSIS — C090 Malignant neoplasm of tonsillar fossa: Secondary | ICD-10-CM

## 2016-12-15 DIAGNOSIS — R51 Headache: Secondary | ICD-10-CM | POA: Diagnosis not present

## 2016-12-15 DIAGNOSIS — M545 Low back pain: Secondary | ICD-10-CM | POA: Insufficient documentation

## 2016-12-15 DIAGNOSIS — R070 Pain in throat: Secondary | ICD-10-CM | POA: Insufficient documentation

## 2016-12-15 DIAGNOSIS — R634 Abnormal weight loss: Secondary | ICD-10-CM | POA: Diagnosis not present

## 2016-12-15 DIAGNOSIS — H539 Unspecified visual disturbance: Secondary | ICD-10-CM | POA: Diagnosis not present

## 2016-12-15 DIAGNOSIS — R110 Nausea: Secondary | ICD-10-CM | POA: Insufficient documentation

## 2016-12-15 DIAGNOSIS — Z6820 Body mass index (BMI) 20.0-20.9, adult: Secondary | ICD-10-CM | POA: Insufficient documentation

## 2016-12-15 DIAGNOSIS — Z79899 Other long term (current) drug therapy: Secondary | ICD-10-CM | POA: Insufficient documentation

## 2016-12-15 NOTE — Progress Notes (Signed)
Mr. Juergensen presents as a walk in today for symptoms that are concerning to him. He completed radiation 07/05/16 to his tonsils and bilateral neck   Pain issues, if any: He reports pain to his back which has continued and he rates it a 6/10 today. He reports severe cramping to the left side of his face. This used to occur when he was yawning, but now happens during other times of the day. This pain is debilitating for the few seconds that it last. He compares it to calf cramping pain. He has a headache with sensitivity to light intermittently during the day. He tried norco for the headache and back pain and he tells me that it did not help him at all.  He is not taking any pain medicine at this timeand has weaned himself off fentanyl patches.  Using a feeding tube?: N/A Weight changes, if any:  Wt Readings from Last 3 Encounters:  12/15/16 148 lb 9.6 oz (67.4 kg)  10/11/16 154 lb (69.9 kg)  09/18/16 152 lb 6.4 oz (69.1 kg)   Swallowing issues, if any: He tells me that food will sometimes get stuck in his throat when swallowing. He reports taste changes which limits what he is eating due to poor tolerance.  Smoking or chewing tobacco? No Using fluoride trays daily? No Last ENT visit was on: Dr. Wilburn Cornelia 11/20/16 Other notable issues, if any:   He reports depression, and occasional mood swings. He reports short term memory deficits. He needs to keep a calendar to remind him of certain items that have happened.  His Left ear feels like it is "popping" at times.   BP (!) 145/87   Pulse 88   Temp 98.3 F (36.8 C)   Ht 5\' 11"  (1.803 m)   Wt 148 lb 9.6 oz (67.4 kg)   SpO2 100% Comment: room air  BMI 20.73 kg/m

## 2016-12-15 NOTE — Progress Notes (Signed)
Radiation Oncology         [phone] ________________________________  Name: [name] MRN: [id number]  Date: 12/15/2016  DOB: 12-Dec-1975  Follow-Up Visit Note  CC: Donnie Coffin, MD  Jerrell Belfast, MD  Diagnosis and Prior Radiotherapy:       ICD-9-CM ICD-10-CM   1. Cancer of tonsillar fossa (HCC) 146.1 C09.0    C09.0 Left tonsil squamous cell carcinoma, p16+ (and possibly right tonsil cancer) T1N1M0 Stage III  CHIEF COMPLAINT: Walk in for significant back pain and headaches  Narrative:  The patient returns today as a walk in for symptoms that are concerning to him.   The patient reports severe headaches for several weeks, coupled with vision changes and nausea. He reports he cannot remember much from the past 6 months; he has begun to leave himself notes about conversations he has and things he must remember to do.  Ongoing pain for several months in right lower back in the mid lumbar region, which causes the patient to be unable to sit or lay still for more than 15 minutes. He cannot remember exactly when it started, but suspects it was around the end of treatment.  He reports tingling and weakness in his legs, which makes him feel like he may fall. This only occurs when walking, never when sitting or laying down. He also reports tingling in fingertips, sometimes this is slightly painful, like electricity.  He reports unexpected facial cramps that are "debilitating." It is not associated with yawning, though yawning is painful.  In the left ear the patient reports ringing and a sharp "pinched" feeling that lasts several minutes. He reports this is also debilitating.   He reports food getting stuck in his throat when he eats, and he must drink lots of water to wash it down. He has ongoing throat pain, but reports he almost doesn't notice it anymore because he has become used to it.  The patient is taking pain medication "rarely" because it does not help his pain. He reports  he stopped using Fentanyl patches "cold [place]" following an emergency at work prompting him to forget to use a patch, at which time he decided not to use any more. He was on 12.5 mcg dose when stopping this patch, and had been tapered.   Additionally, the patient reports he "doesn't feel like himself" at work or at home, though he is not sure why or how to best describe his feeling. He had to return to work on 10/16/16 or risk losing his job, however his original role had been replaced and he is in another position. The patient denies thoughts of hurting himself.    Overall, the patient reports his biggest issues are headaches, his lumbar back pain, and his memory issues.     ALLERGIES:  has No Known Allergies.  Meds: Current Outpatient Prescriptions  Medication Sig Dispense Refill  . fentaNYL (DURAGESIC - DOSED MCG/HR) 12 MCG/HR Place 1 patch (12.5 mcg total) onto the skin every 3 (three) days. 5 patch 0  . fentaNYL (DURAGESIC) 25 MCG/HR patch Place 1 patch (25 mcg total) onto the skin every 3 (three) days. Then taper to 12.5 mcg patches. 5 patch 0  . HYDROcodone-acetaminophen (NORCO) 7.5-325 MG tablet Take 1 tablet by mouth 4 (four) times daily as needed for moderate pain. 120 tablet 0  . lidocaine (XYLOCAINE) 2 % solution Mix 1 part 2% viscous lidocaine,1 part H2O. Swallow +/- swish 57mL of this mixture, 52min before meals and at bedtime, up  to QID (Patient not taking: Reported on 10/18/2016) 100 mL 5  . ranitidine (ZANTAC) 150 MG capsule Take 150 mg by mouth as needed for heartburn.    . senna (SENOKOT) 8.6 MG TABS tablet Take 1-2 tablets PO daily as needed to prevent constipation 60 tablet 2  . sodium fluoride (FLUORISHIELD) 1.1 % GEL dental gel Instill one drop of gel per tooth space of fluoride tray. Place over teeth for 5 minutes. Remove. Spit out excess. Repeat nightly. 120 mL prn   No current facility-administered medications for this encounter.     Physical Findings: The patient is in  no acute distress. Patient is alert and oriented. Wt Readings from Last 3 Encounters:  12/15/16 148 lb 9.6 oz (67.4 kg)  10/11/16 154 lb (69.9 kg)  09/18/16 152 lb 6.4 oz (69.1 kg)    height is 5\' 11"  (1.803 m) and weight is 148 lb 9.6 oz (67.4 kg). His temperature is 98.3 F (36.8 C). His blood pressure is 145/87 (abnormal) and his pulse is 88. His oxygen saturation is 100%.  General: Alert and oriented, in no acute distress. HEENT:  Left eyebrow is a little more raised than right eyebrow. No obvious concerns looking at left tympanic membrane. Mucous membranes are dry. No tumor in the oropharynx. Neck: No palpable cervical or supraclavicular masses. Lymphatics: see Neck Exam Musculoskeletal: No reproducible tenderness in lumbar spine or right paraspinal tissues in lumbar region where his pain is. Neurologic: Grossly he feels his vision is blurry. EOMI. No nystagmus. Otherwise cranial nerves are grossly intact. No obvious focalities. Speech is fluent. Coordination is intact. Psychiatric: Judgment and insight are intact. Affect is appropriate.    Lab Findings: Lab Results  Component Value Date   WBC 4.7 08/21/2016   HGB 12.8 (L) 08/21/2016   HCT 36.8 (L) 08/21/2016   MCV 86.6 08/21/2016   PLT 207 08/21/2016   CMP     Component Value Date/Time   NA 139 08/21/2016 1405   K 4.5 08/21/2016 1405   CL 104 03/12/2016 0345   CL 103 10/22/2012 0146   CO2 29 08/21/2016 1405   GLUCOSE 93 08/21/2016 1405   BUN 10.8 08/21/2016 1405   CREATININE 0.8 08/21/2016 1405   CALCIUM 9.9 08/21/2016 1405   PROT 7.3 08/21/2016 1405   ALBUMIN 4.2 08/21/2016 1405   AST 15 08/21/2016 1405   ALT 12 08/21/2016 1405   ALKPHOS 48 08/21/2016 1405   BILITOT 2.17 (H) 08/21/2016 1405   GFRNONAA >60 03/12/2016 0345   GFRNONAA >60 10/22/2012 0146   GFRAA >60 03/12/2016 0345   GFRAA >60 10/22/2012 0146    Lab Results  Component Value Date   TSH 1.451 10/11/2016    Radiographic Findings:   No results  found.  Impression/Plan:    1) Head and Neck Cancer Status:  New neurologic symptoms warrant workup  2) Nutritional Status: recent weight loss, likely from feeling unwell  3) Risk Factors: p16+, abstaining from tobacco, ETOH in moderation.  4) Swallowing: functional   5) Dental: Encouraged to continue regular followup with dentistry, and dental hygiene including fluoride rinses.   6) Thyroid function: WNL Lab Results  Component Value Date   TSH 1.451 10/11/2016    7) Other: I discussed with the patient that he may be experiencing some post traumatic stress disorder secondary to treatment, and I encouraged him that I will reach out to [employer], LCSW to have the patient meet with her and discuss further options.   I  will order an MRI of the cervical , thoracic and lumbar spine, as well as a brain MRI to rule out possible causes for the patient's significant discomfort.  Need to rule out radiation myelopathy which would be very rare, and rule out metastatic cancer, also very rare after a clear PET, and rule out  other possible unrelated causes  Refer to neurology post MRI.  I spent 35 minutes face to face with patient, over 50% on counseling and care coordination.  _____________________________________   Eppie Gibson, MD  This document serves as a record of services personally performed by Eppie Gibson, MD. It was created on her behalf by Maryla Morrow, a trained medical scribe. The creation of this record is based on the scribe's personal observations and the provider's statements to them. This document has been checked and approved by the attending provider.

## 2016-12-18 ENCOUNTER — Telehealth: Payer: Self-pay | Admitting: Radiation Therapy

## 2016-12-18 ENCOUNTER — Other Ambulatory Visit: Payer: Self-pay | Admitting: Radiation Therapy

## 2016-12-18 ENCOUNTER — Other Ambulatory Visit: Payer: Self-pay | Admitting: Radiation Oncology

## 2016-12-18 DIAGNOSIS — C090 Malignant neoplasm of tonsillar fossa: Secondary | ICD-10-CM

## 2016-12-18 NOTE — Telephone Encounter (Signed)
Patient complained of new symptoms of blurred vision, inability to concentrate, headache and pain in his spine. Dr. Isidore Moos asked that he have a brain mri and complete spinal axis scanned. She has also asked that he see a neurologist after the imaging is complete.       I spoke with [name] to inform him of the upcoming imaging appointments and [name] will set him up to be seen by a neurologist soon after his imaging. He is happy with this plan and has my contact information in case he has questions in the future.  Mont Dutton R.T.(R)(T) Special Procedures Navigator

## 2016-12-19 ENCOUNTER — Telehealth: Payer: Self-pay | Admitting: *Deleted

## 2016-12-19 NOTE — Telephone Encounter (Signed)
CALLED PATIENT TO INFORM OF SCANS AND [facility] APPT. - ARRIVAL TIME- 2:30 PM WITH DR. Leta Baptist ON 01-01-17- [address]ESS [phone], [place], [place] PH. NO. - [phone], LVM FOR A RETURN CALL

## 2016-12-20 ENCOUNTER — Encounter: Payer: Self-pay | Admitting: *Deleted

## 2016-12-20 NOTE — Progress Notes (Signed)
Clinical Social Work received referral from radiation oncologist for difficulty coping/managing life after cancer treatment.  CSW contacted patient by phone and briefly explored patient's perspective.  CSW and patient scheduled counseling session for 01/04/17 at 1:00.    Polo Riley, MSW, LCSW, OSW-C Clinical Social Worker [facility] [phone]

## 2016-12-21 ENCOUNTER — Encounter: Payer: Self-pay | Admitting: *Deleted

## 2016-12-22 ENCOUNTER — Ambulatory Visit
Admission: RE | Admit: 2016-12-22 | Discharge: 2016-12-22 | Disposition: A | Discharge status: Home / Self Care | Payer: Managed Care, Other (non HMO) | Source: Ambulatory Visit | Attending: Radiation Oncology | Admitting: Radiation Oncology

## 2016-12-22 ENCOUNTER — Inpatient Hospital Stay
Admission: RE | Admit: 2016-12-22 | Discharge: 2016-12-22 | Disposition: A | Discharge status: Home / Self Care | Payer: Managed Care, Other (non HMO) | Source: Ambulatory Visit | Attending: Radiation Oncology | Admitting: Radiation Oncology

## 2016-12-22 ENCOUNTER — Encounter: Payer: Self-pay | Admitting: Radiation Oncology

## 2016-12-22 VITALS — BP 127/80 | HR 89 | Temp 98.6°F | Ht 71.0 in | Wt 147.4 lb

## 2016-12-22 DIAGNOSIS — C090 Malignant neoplasm of tonsillar fossa: Secondary | ICD-10-CM

## 2016-12-22 DIAGNOSIS — R51 Headache: Principal | ICD-10-CM

## 2016-12-22 DIAGNOSIS — R519 Headache, unspecified: Secondary | ICD-10-CM

## 2016-12-22 DIAGNOSIS — M792 Neuralgia and neuritis, unspecified: Secondary | ICD-10-CM

## 2016-12-22 DIAGNOSIS — G609 Hereditary and idiopathic neuropathy, unspecified: Secondary | ICD-10-CM

## 2016-12-22 MED ORDER — AMITRIPTYLINE HCL 25 MG PO TABS: 25.0000 mg | ORAL_TABLET | Freq: Every day | ORAL | 0 refills | Status: DC

## 2016-12-22 MED ORDER — GADOBENATE DIMEGLUMINE 529 MG/ML IV SOLN
15.0000 mL | Freq: Once | INTRAVENOUS | Status: AC | PRN
  Administered 2016-12-22: 13 mL via INTRAVENOUS

## 2016-12-22 NOTE — Progress Notes (Signed)
Radiation Oncology         [phone] ________________________________  Name: [name] MRN: [id number]  Date: 12/22/2016  DOB: Feb 02, 1976  Follow-Up Visit Note  CC: Donnie Coffin, MD  Jerrell Belfast, MD  Diagnosis and Prior Radiotherapy:       ICD-9-CM ICD-10-CM   1. Bilateral headaches 784.0 R51 RPR     Vitamin B12     Sedimentation rate     C-reactive protein  2. Cancer of tonsillar fossa (HCC) 146.1 C09.0   3. Hereditary and idiopathic peripheral neuropathy 356.9 G60.9 RPR     Vitamin B12  4. 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 He reports continued pain to his back and left temple regions. He has MRI of the brain and spinal cord scheduled for today and next week to evaluate this pain. He is taking Hydrocodone at night as his only pain relief. He is upset that he needs to take 3 hydrocodone pills to relieve the pain and reports that level of medication just puts him to sleep.   He reports doing poorly at work and received a Quarry manager from [employer] allowing him to be excused from work. He did not turn this letter in, and he was given the original copy today. He continues to be concerned about his memory difficulty and his work situation. He also notes extreme fatigue that is affecting his daily life.   He reports headaches start around the left eye and temple. The headaches are daily and they get progressively worse over the course of several hours. He describes the pain as dull and achy with extreme light sensitivity. He reports trying Imitrex for 2 days with no relief.   Patient reports  an intermittent pins and needles sensation in bilateral legs with walking. He notes this sensation lasts 5 seconds. He notes he will experience a similar sensation rarely in his fingers.  He reports continued cramping on the left side of his face and along his neck. He reports these spasms are becoming more frequent. He describes this as a shooting pain.  Patient denies hyperarousal, denies recurrent thoughts of the treatment. He reports that he is scared of the symptoms.  ALLERGIES:  has No Known Allergies.  Meds: Current Outpatient Prescriptions  Medication Sig Dispense Refill  . HYDROcodone-acetaminophen (NORCO) 7.5-325 MG tablet Take 1 tablet by mouth 4 (four) times daily as needed for moderate pain. 120 tablet 0  . sodium fluoride (FLUORISHIELD) 1.1 % GEL dental gel Instill one drop of gel per tooth space of fluoride tray. Place over teeth for 5 minutes. Remove. Spit out excess. Repeat nightly. 120 mL prn  . amitriptyline (ELAVIL) 25 MG tablet Take 1 tablet (25 mg total) by mouth at bedtime. Increase to 2 tablets, at bedtime after 1 week, if tolerated and if you don't have complete resolution of symptoms. 60 tablet 0  . fentaNYL (DURAGESIC - DOSED MCG/HR) 12 MCG/HR Place 1 patch (12.5 mcg total) onto the skin every 3 (three) days. (Patient not taking: Reported on 12/22/2016) 5 patch 0  . fentaNYL (DURAGESIC) 25 MCG/HR patch  Place 1 patch (25 mcg total) onto the skin every 3 (three) days. Then taper to 12.5 mcg patches. (Patient not taking: Reported on 12/22/2016) 5 patch 0  . lidocaine (XYLOCAINE) 2 % solution Mix 1 part 2% viscous lidocaine,1 part H2O. Swallow +/- swish 87m of this mixture, 372m before meals and at bedtime, up to QID (Patient not taking: Reported on 10/18/2016) 100 mL 5  . ranitidine (ZANTAC) 150 MG capsule Take 150 mg by mouth as needed for heartburn.    . senna (SENOKOT) 8.6 MG TABS tablet Take 1-2 tablets PO daily as needed to prevent constipation (Patient not taking: Reported  on 12/22/2016) 60 tablet 2   No current facility-administered medications for this encounter.     Physical Findings: The patient is in no acute distress. Patient is alert and oriented. Wt Readings from Last 3 Encounters:  12/22/16 147 lb 6.4 oz (66.9 kg)  12/15/16 148 lb 9.6 oz (67.4 kg)  10/11/16 154 lb (69.9 kg)    height is 5' 11"  (1.803 m) and weight is 147 lb 6.4 oz (66.9 kg). His temperature is 98.6 F (37 C). His blood pressure is 127/80 and his pulse is 89. His oxygen saturation is 100%.   General: Alert and oriented, in mild distress, non toxic appearing.    Lab Findings: Lab Results  Component Value Date   WBC 4.7 08/21/2016   HGB 12.8 (L) 08/21/2016   HCT 36.8 (L) 08/21/2016   MCV 86.6 08/21/2016   PLT 207 08/21/2016   CMP     Component Value Date/Time   NA 139 08/21/2016 1405   K 4.5 08/21/2016 1405   CL 104 03/12/2016 0345   CL 103 10/22/2012 0146   CO2 29 08/21/2016 1405   GLUCOSE 93 08/21/2016 1405   BUN 10.8 08/21/2016 1405   CREATININE 0.8 08/21/2016 1405   CALCIUM 9.9 08/21/2016 1405   PROT 7.3 08/21/2016 1405   ALBUMIN 4.2 08/21/2016 1405   AST 15 08/21/2016 1405   ALT 12 08/21/2016 1405   ALKPHOS 48 08/21/2016 1405   BILITOT 2.17 (H) 08/21/2016 1405   GFRNONAA >60 03/12/2016 0345   GFRNONAA >60 10/22/2012 0146   GFRAA >60 03/12/2016 0345   GFRAA >60 10/22/2012 0146    Lab Results  Component Value Date   TSH 1.451 10/11/2016    Radiographic Findings:   Mr BrJeri Coso Contrast  Result Date: 12/22/2016 CLINICAL DATA:  Squamous cell carcinoma of the left tonsil. EXAM: MRI HEAD WITHOUT AND WITH CONTRAST TECHNIQUE: Multiplanar, multiecho pulse sequences of the brain and surrounding structures were obtained without and with intravenous contrast. CONTRAST:  1352mULTIHANCE GADOBENATE DIMEGLUMINE 529 MG/ML IV SOLN COMPARISON:  CT of the neck 03/03/2016 FINDINGS: Brain: A focal area of susceptibility is evident in the right frontal operculum A  developmental venous anomaly is seen adjacent to this lesion. 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 There is no other focal enhancement to suggest metastatic disease to the brain or meninges. No acute infarct, hemorrhage, or mass lesion is present. The ventricles are of normal size. No significant extraaxial fluid collection is present. No significant white matter disease is present. Vascular: Flow is present in the major intracranial arteries. Skull and upper cervical spine: The skullbase is normal. The craniocervical junction is normal. Marrow signal is normal. The upper cervical spine is within normal limits. Midline sagittal structures are unremarkable. Sinuses/Orbits: The paranasal sinuses and mastoid air  cells are clear. The globes and orbits are within normal limits. IMPRESSION: 1. 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 2. No evidence of metastatic disease to the brain. Electronically Signed   By: San Morelle M.D.   On: 12/22/2016 17:05    Impression/Plan:    Head and Neck Cancer Status:  New neurologic symptoms  No evidence of cancer.  I will workup for temporal artritis with ESR and CRP if either are elevated we may consider sending him tho rheumatology.    Offered referral to physical therapy for muscle and nerve pain. He will wait on this, consider later. Order lab tests on vitamin B12 and syphilis blood test for unexplained neurological symptoms. Patient will undergo MRI of the brain today and MRI of the spinal cord next week. Prescribe Amitriptyline for pain management as a trial. A letter was written to excuse him from work indefinitely.  Patient will follow up with neurology on 01/01/17. Social work visit pending. He is under extreme stress.  Denies thoughts of self harm.  I spent 40  minutes  face to face with patient, over 50% on counseling and care coordination.  Addendum: brain MRI this PM negative for acute events or etiology for symptoms.  I called patient to inform him.   _____________________________________   Eppie Gibson, MD  This document serves as a record of services personally performed by Eppie Gibson, MD. It was created on her behalf by Bethann Humble, a trained medical scribe. The creation of this record is based on the scribe's personal observations and the provider's statements to them. This document has been checked and approved by the attending provider.

## 2016-12-22 NOTE — Progress Notes (Signed)
[name] presents today for continued pain to his back and Left Temple regions. He was last seen 12/15/16 for these same concerns. He has imaging scheduled today and into next week to evaluate this pain. He is very distressed about this continued pain and called yesterday to report it. He has not returned to taking his fentanyl patches. He is taking hydrocodone at night only for pain medicine. He is upset that he needs to take 3 pills of hydrocodone to relieve his pain, but it will often just put him to sleep. He is not doing well at work, and did receive a letter from [name] to help him be excused from work. He did not turn this letter in, and I have given him the original today. He continues to be concerned about his memory difficulties and his work situation. He also continues to have a cramping feeling to the left side of his face.   BP 127/80   Pulse 89   Temp 98.6 F (37 C)   Ht 5\' 11"  (1.803 m)   Wt 147 lb 6.4 oz (66.9 kg)   SpO2 100% Comment: room air  BMI 20.56 kg/m    Wt Readings from Last 3 Encounters:  12/22/16 147 lb 6.4 oz (66.9 kg)  12/15/16 148 lb 9.6 oz (67.4 kg)  10/11/16 154 lb (69.9 kg)

## 2016-12-23 ENCOUNTER — Other Ambulatory Visit: Payer: Self-pay

## 2016-12-23 LAB — C-REACTIVE PROTEIN: CRP: 0.4 mg/L (ref 0.0–4.9)

## 2016-12-23 LAB — SEDIMENTATION RATE: SED RATE: 2 mm/h (ref 0–15)

## 2016-12-23 LAB — RPR: RPR: NONREACTIVE

## 2016-12-23 LAB — VITAMIN B12: Vitamin B12: 505 pg/mL (ref 232–1245)

## 2016-12-24 ENCOUNTER — Other Ambulatory Visit: Payer: Self-pay | Admitting: Gastroenterology

## 2016-12-24 ENCOUNTER — Ambulatory Visit
Admission: RE | Admit: 2016-12-24 | Discharge: 2016-12-24 | Disposition: A | Discharge status: Home / Self Care | Payer: Managed Care, Other (non HMO) | Source: Ambulatory Visit | Attending: Radiation Oncology | Admitting: Radiation Oncology

## 2016-12-24 DIAGNOSIS — C090 Malignant neoplasm of tonsillar fossa: Secondary | ICD-10-CM

## 2016-12-24 MED ORDER — GADOBENATE DIMEGLUMINE 529 MG/ML IV SOLN
13.0000 mL | Freq: Once | INTRAVENOUS | Status: AC | PRN
  Administered 2016-12-24: 13 mL via INTRAVENOUS

## 2016-12-25 ENCOUNTER — Other Ambulatory Visit: Payer: Self-pay | Admitting: Radiation Oncology

## 2016-12-25 DIAGNOSIS — C090 Malignant neoplasm of tonsillar fossa: Secondary | ICD-10-CM

## 2016-12-25 NOTE — Progress Notes (Signed)
I informed pt of results from Friday's labwork and MRI of L spine.   No etiology found thus far for symptoms. Referring to PT for symptom management.  Neurology appt in 1 week. .-----------------------------------  Eppie Gibson, MD

## 2016-12-26 ENCOUNTER — Telehealth: Payer: Self-pay | Admitting: *Deleted

## 2016-12-26 NOTE — Telephone Encounter (Signed)
SPOKE WITH ROSE OF [facility] AND SHE SPOKE WITH MR. Mishkin TO ARRANGE THIS APPT., PT. STATED THAT WOULD CALL HER BACK LATER TODAY

## 2016-12-27 ENCOUNTER — Other Ambulatory Visit: Payer: Self-pay | Admitting: Radiation Therapy

## 2016-12-27 ENCOUNTER — Other Ambulatory Visit: Payer: Self-pay

## 2016-12-27 ENCOUNTER — Telehealth: Payer: Self-pay | Admitting: Radiation Therapy

## 2016-12-27 ENCOUNTER — Ambulatory Visit
Admission: RE | Admit: 2016-12-27 | Discharge: 2016-12-27 | Disposition: A | Discharge status: Home / Self Care | Payer: Managed Care, Other (non HMO) | Source: Ambulatory Visit | Attending: Radiation Oncology | Admitting: Radiation Oncology

## 2016-12-27 ENCOUNTER — Other Ambulatory Visit: Payer: Self-pay | Admitting: Urology

## 2016-12-27 DIAGNOSIS — M546 Pain in thoracic spine: Secondary | ICD-10-CM

## 2016-12-27 DIAGNOSIS — C090 Malignant neoplasm of tonsillar fossa: Secondary | ICD-10-CM

## 2016-12-27 NOTE — Telephone Encounter (Signed)
Informed [name] that his MRI studies have been rescheduled to tomorrow at 10:00, arrive at 9:40. I have encouraged him to take his pain medication prior to going to help him be more comfortable. He understands and plans to take the pain medication.   Cervical and Thoracic MRI scans will be done, with and without contrast.    Mont Dutton R.T.(R)(T) Special Procedures Navigator

## 2016-12-28 ENCOUNTER — Other Ambulatory Visit: Payer: Self-pay | Admitting: Gastroenterology

## 2016-12-28 ENCOUNTER — Other Ambulatory Visit: Payer: Self-pay

## 2016-12-28 ENCOUNTER — Ambulatory Visit
Admission: RE | Admit: 2016-12-28 | Discharge: 2016-12-28 | Disposition: A | Discharge status: Home / Self Care | Payer: Managed Care, Other (non HMO) | Source: Ambulatory Visit | Attending: Radiation Oncology | Admitting: Radiation Oncology

## 2016-12-28 MED ORDER — GADOBENATE DIMEGLUMINE 529 MG/ML IV SOLN: 13.0000 mL | Freq: Once | INTRAVENOUS | Status: DC | PRN

## 2016-12-29 ENCOUNTER — Telehealth: Payer: Self-pay | Admitting: Radiation Oncology

## 2016-12-29 ENCOUNTER — Other Ambulatory Visit: Payer: Self-pay | Admitting: Radiation Oncology

## 2016-12-29 NOTE — Telephone Encounter (Signed)
Called patient to inform him of remaining MRI spine results from 3-14 which do not explain his symptoms. Fortunately no sign of radiation myelitis or metastatic disease.  He sees neurology on 3-19.  I also recommended he get a PCP, as he no longer follows with his prior PCP. -----------------------------------  Eppie Gibson, MD

## 2017-01-01 ENCOUNTER — Ambulatory Visit (INDEPENDENT_AMBULATORY_CARE_PROVIDER_SITE_OTHER): Payer: Managed Care, Other (non HMO) | Admitting: Diagnostic Neuroimaging

## 2017-01-01 ENCOUNTER — Encounter: Payer: Self-pay | Admitting: Diagnostic Neuroimaging

## 2017-01-01 VITALS — BP 98/60 | HR 98 | Ht 70.0 in | Wt 146.0 lb

## 2017-01-01 DIAGNOSIS — G43009 Migraine without aura, not intractable, without status migrainosus: Secondary | ICD-10-CM

## 2017-01-01 DIAGNOSIS — C770 Secondary and unspecified malignant neoplasm of lymph nodes of head, face and neck: Secondary | ICD-10-CM | POA: Diagnosis not present

## 2017-01-01 DIAGNOSIS — R202 Paresthesia of skin: Secondary | ICD-10-CM | POA: Diagnosis not present

## 2017-01-01 DIAGNOSIS — M792 Neuralgia and neuritis, unspecified: Secondary | ICD-10-CM

## 2017-01-01 DIAGNOSIS — C090 Malignant neoplasm of tonsillar fossa: Secondary | ICD-10-CM

## 2017-01-01 MED ORDER — GABAPENTIN 300 MG PO CAPS: 300.0000 mg | ORAL_CAPSULE | Freq: Two times a day (BID) | ORAL | 6 refills | Status: DC

## 2017-01-01 NOTE — Progress Notes (Signed)
[facility]  PATIENT: [name] DOB: 09/17/1976  REFERRING CLINICIAN: Pearlie Oyster, MD HISTORY FROM: patient  REASON FOR VISIT: new consult    HISTORICAL  CHIEF COMPLAINT:  Chief Complaint  Patient presents with  . Neurological symptoms    rm 7, new Pt, "headaches, back pain, numbness, cramping, fatigue, confusion"  . 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 Isidore Moos 04/21/16), status post left tonsillectomy, nasopharyngeal biopsies, base of tongue biopsy in June 2017, status post intensity modulated radiation therapy from August until September 2017, here for evaluation of constellation of symptoms according headache, back pain, numbness, pain, cramping.  Patient had intermittent sore throat sensation from 2014 2 2015. He went to PCP for evaluation, treated empirically for upper respiratory infection and strep throat. In March 2017 patient noted a small mass on the left side of his throat. One day he woke up sweating and confused. He followed up at the emergency room for evaluation, was set up with ENT evaluation and suited had diagnosis of left tonsil squamous cell carcinoma. Patient was treated with left tonsillectomy, and then several rounds of radiation therapy to the tonsils and bilateral neck.  During this time patient continued to eat by mouth and was supplemented with boost shakes. However he gradually lost approximate 40 pounds of weight during his treatment. Patient weight declined from 185 down to 145 pounds.  Around the same timeframe patient started to have intermittent shooting electrical sensations in his legs, arms, body. Patient had intermittent headaches, typically left-sided, associated with photophobia and nausea. He has several of these headaches per week up to 3 per  week. He also has lower grade daily headache.  Patient also having intermittent right lower back pain, sometimes radiating to his right leg. Patient has an episodes of exercise-induced numbness and weakness in the bilateral lower extremities. He describes intermittent right sided foot drop.  Patient having more anxiety, depression, insomnia, memory loss and confusion. His sleep and insomnia issues are mainly related to uncomfortable sensation in the evening and bedtime.  Patient was treated with fentanyl previously, and due to concern of possible opioid side effect this was gradually weaned off. However his symptoms did not improve. He has used small course of hydrocodone with Tylenol for back pain without relief. One week ago he was started on amitriptyline to help with insomnia and nerve pain. He has not noticed benefit yet.  Patient had MRI of the brain, cervical spine, thoracic spine and lumbar spine which I reviewed. No significant abnormalities to explain his neurologic symptoms at this time.  Patient also had lab testing to look for alternate causes of neuropathy which were unremarkable.    REVIEW OF SYSTEMS: Full 14 system review of systems performed and negative with exception of: Eye twitching weight loss hearing loss ringing in ears cramps dizziness sleepiness. Otherwise as per history of present illness.  ALLERGIES: No Known Allergies  HOME MEDICATIONS: Outpatient Medications Prior to Visit  Medication Sig Dispense Refill  . amitriptyline (ELAVIL) 25 MG tablet Take 1 tablet (25 mg total) by mouth at bedtime. Increase to 2 tablets, at bedtime after 1 week, if tolerated and if you don't have complete resolution of symptoms. 60 tablet 0  . HYDROcodone-acetaminophen (NORCO) 7.5-325 MG tablet Take 1 tablet by mouth 4 (four)  times daily as needed for moderate pain. 120 tablet 0  . lidocaine (XYLOCAINE) 2 % solution Mix 1 part 2% viscous lidocaine,1 part H2O. Swallow +/- swish 49mL of  this mixture, 23min before meals and at bedtime, up to QID 100 mL 5  . ranitidine (ZANTAC) 150 MG capsule Take 150 mg by mouth as needed for heartburn.    . senna (SENOKOT) 8.6 MG TABS tablet Take 1-2 tablets PO daily as needed to prevent constipation 60 tablet 2  . sodium fluoride (FLUORISHIELD) 1.1 % GEL dental gel Instill one drop of gel per tooth space of fluoride tray. Place over teeth for 5 minutes. Remove. Spit out excess. Repeat nightly. 120 mL prn  . fentaNYL (DURAGESIC - DOSED MCG/HR) 12 MCG/HR Place 1 patch (12.5 mcg total) onto the skin every 3 (three) days. (Patient not taking: Reported on 12/22/2016) 5 patch 0  . fentaNYL (DURAGESIC) 25 MCG/HR patch Place 1 patch (25 mcg total) onto the skin every 3 (three) days. Then taper to 12.5 mcg patches. (Patient not taking: Reported on 12/22/2016) 5 patch 0   No facility-administered medications prior to visit.     PAST MEDICAL HISTORY: Past Medical History:  Diagnosis Date  . Back injury due to skiing accident 2004   jet ski accident  . Cancer [facility])    tosillar, s/p radiation therapy  . GERD (gastroesophageal reflux disease)   . Mitral valve prolapse   . Weight loss     PAST SURGICAL HISTORY: Past Surgical History:  Procedure Laterality Date  . ELBOW SURGERY Right   . KNEE SURGERY Left   . NASOPHARYNGOSCOPY Left 04/14/2016   Procedure: BASE NASAL NASOPHARYNX;  Surgeon: Jerrell Belfast, MD;  Location: [place];  Service: ENT;  Laterality: Left;  . TONSILLECTOMY Left 04/14/2016   Procedure: TONSILLECTOMY AND BIOPSY OF TONGUE ;  Surgeon: Jerrell Belfast, MD;  Location: [place];  Service: ENT;  Laterality: Left;    FAMILY HISTORY: Family History  Problem Relation Age of Onset  . Cancer Mother     Breast  . Cancer Father     Lung  . Cancer Brother     brain  . Cancer Maternal Grandfather     leukemia    SOCIAL HISTORY:  Social History   Social History  . Marital status: Married     Spouse name: N/A  . Number of children: 0  . Years of education: 12   Occupational History  .      [employer]   Social History Main Topics  . Smoking status: Never Smoker  . Smokeless tobacco: Former Systems developer  . Alcohol use 0.0 oz/week     Comment: social: Research scientist (life sciences) at [employer]  . Drug use: No  . Sexual activity: Not on file   Other Topics Concern  . Not on file   Social History Narrative   Lives with wife   caffeine , rare     PHYSICAL EXAM  GENERAL EXAM/CONSTITUTIONAL: Vitals:  Vitals:   01/01/17 1506  BP: 98/60  Pulse: 98  Weight: 146 lb (66.2 kg)  Height: 5\' 10"  (1.778 m)     Body mass index is 20.95 kg/m.  No exam data present  Patient is in no distress; well developed, nourished and groomed; neck is supple  SLIGHT ASYMM OF THROAT FROM POST-OP CHANGES TO LEFT THROAT  CARDIOVASCULAR:  Examination of carotid arteries is normal; no carotid bruits  Regular rate and rhythm, no  murmurs  Examination of peripheral vascular system by observation and palpation is normal  EYES:  Ophthalmoscopic exam of optic discs and posterior segments is normal; no papilledema or hemorrhages  MUSCULOSKELETAL:  Gait, strength, tone, movements noted in Neurologic exam below  NEUROLOGIC: MENTAL STATUS:  No flowsheet data found.  awake, alert, oriented to person, place and time  recent and remote memory intact  normal attention and concentration  language fluent, comprehension intact, naming intact,   fund of knowledge appropriate  CRANIAL NERVE:   2nd - no papilledema on fundoscopic exam  2nd, 3rd, 4th, 6th - pupils equal and reactive to light, visual fields full to confrontation, extraocular muscles intact, no nystagmus  5th - facial sensation symmetric  7th - facial strength symmetric  8th - hearing intact  9th - palate elevates symmetrically, uvula midline  11th - shoulder shrug symmetric  12th - tongue  protrusion midline  MOTOR:   normal bulk and tone, full strength in the BUE, BLE  EXCEPT ATROPHY OF LEFT TRAPEZIUS  SENSORY:   normal and symmetric to light touch, temperature, vibration  DECR PP IN FEET AND ANKLES UP TO KNEES  COORDINATION:   finger-nose-finger, fine finger movements normal  REFLEXES:   deep tendon reflexes present and symmetric  GAIT/STATION:   narrow based gait; able to walk on toes, heels and tandem; romberg is negative    DIAGNOSTIC DATA (LABS, IMAGING, TESTING) - I reviewed patient records, labs, notes, testing and imaging myself where available.  Lab Results  Component Value Date   WBC 4.7 08/21/2016   HGB 12.8 (L) 08/21/2016   HCT 36.8 (L) 08/21/2016   MCV 86.6 08/21/2016   PLT 207 08/21/2016      Component Value Date/Time   NA 139 08/21/2016 1405   K 4.5 08/21/2016 1405   CL 104 03/12/2016 0345   CL 103 10/22/2012 0146   CO2 29 08/21/2016 1405   GLUCOSE 93 08/21/2016 1405   BUN 10.8 08/21/2016 1405   CREATININE 0.8 08/21/2016 1405   CALCIUM 9.9 08/21/2016 1405   PROT 7.3 08/21/2016 1405   ALBUMIN 4.2 08/21/2016 1405   AST 15 08/21/2016 1405   ALT 12 08/21/2016 1405   ALKPHOS 48 08/21/2016 1405   BILITOT 2.17 (H) 08/21/2016 1405   GFRNONAA >60 03/12/2016 0345   GFRNONAA >60 10/22/2012 0146   GFRAA >60 03/12/2016 0345   GFRAA >60 10/22/2012 0146   No results found for: CHOL, HDL, LDLCALC, LDLDIRECT, TRIG, CHOLHDL No results found for: HGBA1C Lab Results  Component Value Date   VITAMINB12 505 12/22/2016   Lab Results  Component Value Date   TSH 1.451 10/11/2016    12/22/16 RPR - non-reactive  12/22/16 MRI brain [I reviewed images myself and agree with interpretation. -VRP]  1. 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 2. No evidence of metastatic disease to the brain.  12/28/16 MRI cervical / thoracic [I reviewed images myself  and agree with interpretation. -VRP]  1. Expected post radiation changes to the bone marrow from the skullbase to the T4 level. 2. Mildly heterogeneous remaining thoracic (and lumbar) spine marrow signal compatible with compensatory red marrow reactivation. 3. No  metastatic disease identified. 4. Chronic disc and acute on chronic endplate degeneration at C6-C7. Associated moderate to severe left greater than right C7 neural foraminal stenosis, but no associated cervical spinal stenosis. 5. No other significant cervical spine degeneration. 6. Mild chronic T4 compression fracture  is unchanged since the presentation neck CT in 2017. 7. No significant thoracic spine degeneration. 8. Post radiation changes to the neck soft tissues including mild retropharyngeal effusion.  12/28/16 MRI lumbar spine [I reviewed images myself and agree with interpretation. -VRP]  1. No significant disc protrusion, foraminal stenosis or central canal stenosis of the lumbar spine.     ASSESSMENT AND PLAN  41 y.o. year old male here with left tonsil squamous cell carcinoma status post left tonsillectomy (June 2017), status post radiation therapy (August - September 2017), now with intermittent headaches, intermittent numbness and tingling, intermittent electrical sensation and body, intermittent right lower back pain. Neurologic examination notable for decreased pinprick sensation in bilateral lower extremities from feet to knees and left trapezius muscle atrophy and postsurgical changes in the left throat.   Possible neurologic explanations could include radiation induced cervical myelitis (although not fully supported by MRI) vs alternate cause of peripheral neuropathy (lab workup negative). Also with new onset of migraine, anxiety and insomnia, which may aggravate nerve pain symptoms.    Ddx: radiation myelitis (cervical), weight loss associated pain, neuropathy, migraine, tension HA  1. Cancer of tonsillar fossa  ([place])   2. 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 Migraine without aura and without status migrainosus, not intractable   4. Paresthesia   5. Nerve pain      PLAN: - continue amitriptyline 50mg  at bedtime - add gabapentin 300mg  at bedtime; then after 1-2 weeks increase to twice a day - agree with PT for back pain issues - encouraged patient to continue to gradually improve nutrition and physical activity - use ibuprofen and tylenol as needed for headaches and back pain - may consider EMG/NCS in future  Meds ordered this encounter  Medications  . gabapentin (NEURONTIN) 300 MG capsule    Sig: Take 1 capsule (300 mg total) by mouth 2 (two) times daily.    Dispense:  60 capsule    Refill:  6   Return in about 2 months (around 03/03/2017).    Penni Bombard, MD 02/06/5360, 4:43 PM Certified in Neurology, Neurophysiology and Neuroimaging  [facility] [address] [place], [place] [address] [phone]

## 2017-01-01 NOTE — Patient Instructions (Signed)
Thank you for coming to see [place] at [facility]. I hope we have been able to provide you high quality care today.  You may receive a patient satisfaction survey over the next few weeks. We would appreciate your feedback and comments so that we may continue to improve ourselves and the health of our patients.  - continue amitriptyline 55m at bedtime  - add gabapentin 3029mat bedtime; then after 1-2 weeks increase to twice a day  - agree with physical therapy for back pain issues  - continue to gradually improve nutrition and physical activity  - use ibuprofen and tylenol as needed for headaches and back pain   ~~~~~~~~~~~~~~~~~~~~~~~~~~~~~~~~~~~~~~~~~~~~~~~~~~~~~~~~~~~~~~~~~  DR. PENUMALLI'S GUIDE TO HAPPY AND HEALTHY LIVING These are some of my general health and wellness recommendations. Some of them may apply to you better than others. Please use common sense as you try these suggestions and feel free to ask me any questions.   ACTIVITY/FITNESS Mental, social, emotional and physical stimulation are very important for brain and body health. Try learning a new activity (arts, music, language, sports, games).  Keep moving your body to the best of your abilities. You can do this at home, inside or outside, the park, community center, gym or anywhere you like. Consider a physical therapist or personal trainer to get started. Consider the app Sworkit. Fitness trackers such as smart-watches, smart-phones or Fitbits can help as well.   NUTRITION Eat more plants: colorful vegetables, nuts, seeds and berries.  Eat less sugar, salt, preservatives and processed foods.  Avoid toxins such as cigarettes and alcohol.  Drink water when you are thirsty. Warm water with a slice of lemon is an excellent morning drink to start the day.  Consider these websites for more information The Nutrition Source (hthttps://www.henry-hernandez.biz/Precision Nutrition  (wwWindowBlog.ch  RELAXATION Consider practicing mindfulness meditation or other relaxation techniques such as deep breathing, prayer, yoga, tai chi, massage. See website mindful.org or the apps Headspace or Calm to help get started.   SLEEP Try to get at least 7-8+ hours sleep per day. Regular exercise and reduced caffeine will help you sleep better. Practice good sleep hygeine techniques. See website sleep.org for more information.   PLANNING Prepare estate planning, living will, healthcare POA documents. Sometimes this is best planned with the help of an attorney. Theconversationproject.org and agingwithdignity.org are excellent resources.

## 2017-01-02 ENCOUNTER — Ambulatory Visit: Payer: Managed Care, Other (non HMO) | Attending: Radiation Oncology | Admitting: Physical Therapy

## 2017-01-02 DIAGNOSIS — M545 Low back pain, unspecified: Secondary | ICD-10-CM

## 2017-01-02 DIAGNOSIS — I890 Lymphedema, not elsewhere classified: Secondary | ICD-10-CM | POA: Insufficient documentation

## 2017-01-02 DIAGNOSIS — R51 Headache: Secondary | ICD-10-CM | POA: Diagnosis present

## 2017-01-02 DIAGNOSIS — G8929 Other chronic pain: Secondary | ICD-10-CM | POA: Diagnosis present

## 2017-01-02 DIAGNOSIS — M6281 Muscle weakness (generalized): Secondary | ICD-10-CM | POA: Diagnosis present

## 2017-01-02 NOTE — Therapy (Signed)
[place], [place], [address] Phone: [phone]   Fax:  [phone]  Physical Therapy Treatment  Patient Details  Name: [name] MRN: [id number] Date of Birth: 02/13/1976 Referring Provider: Dr. Isidore Moos   Encounter Date: 01/02/2017      PT End of Session - 01/02/17 1239    Visit Number 2   Number of Visits 15   Date for PT Re-Evaluation 02/12/17   PT Start Time 1100   PT Stop Time 1145   PT Time Calculation (min) 45 min   Activity Tolerance Patient limited by pain   Behavior During Therapy [facility] for tasks assessed/performed      Past Medical History:  Diagnosis Date  . Back injury due to skiing accident 2004   jet ski accident  . Cancer [facility])    tosillar, s/p radiation therapy  . GERD (gastroesophageal reflux disease)   . Mitral valve prolapse   . Weight loss     Past Surgical History:  Procedure Laterality Date  . ELBOW SURGERY Right   . KNEE SURGERY Left   . NASOPHARYNGOSCOPY Left 04/14/2016   Procedure: BASE NASAL NASOPHARYNX;  Surgeon: Jerrell Belfast, MD;  Location: [place];  Service: ENT;  Laterality: Left;  . TONSILLECTOMY Left 04/14/2016   Procedure: TONSILLECTOMY AND BIOPSY OF TONGUE ;  Surgeon: Jerrell Belfast, MD;  Location: [place];  Service: ENT;  Laterality: Left;    There were no vitals filed for this visit.      Subjective Assessment - 01/02/17 1130    Subjective Pt was not able to continue with treatment started in January because he went back to work.  He symtoms of pain have progressed and he has been taken out of work again.  He went to see the neurologist yesterday.  He has pain in his back and legs He has difficulty finding a comfortable position to rest in.  If he tries to walk, he has numbness and weakness in his legs.  He has headaches with searing cramp pains in front of left neck up to 10 times a day.    Pertinent History  Presented with left neck swelling to ED at end of May; had been fatigued prior to that.  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  PET 04/07/16 showed solitary left level 2 lymph node enlarged and hypermetabolic.  Left tonsillectomy 04/14/16.  XRT treatment completed 07/05/16.  Recently Had to stop work due to back pain, headaches and weakness in legs with continues activity.  MRIs are inconclusive as to cause of his continued problems. He reports continues weight loss now up to about 40 pounds.    Limitations Sitting;Standing;Walking;House hold activities   Patient Stated Goals to get help with his back pain    Currently in Pain? Yes   Pain Score 6    Pain Location Back   Pain Orientation Mid;Lower;Right   Pain Descriptors / Indicators Aching;Throbbing  gets "Hot"    Pain Type Chronic pain   Pain Radiating Towards up his back    Pain Onset More than a month ago   Pain Frequency Constant   Aggravating Factors  pt is not able to get into a comfortable position    Pain Relieving Factors sometimes a heating pad helps.    Effect of Pain on Daily Activities can't sit or stand or lie in  one place for long             [facility] PT Assessment - 01/02/17 0001      Assessment   Medical Diagnosis left tonsil squamous cell carcinoma, stage III with lymph node involvement   Referring Provider Dr. Isidore Moos    Onset Date/Surgical Date 04/14/16  left tonsillectomy   Hand Dominance Right   Prior Therapy none     Precautions   Precautions Other (comment)   Precaution Comments cancer precautions     Restrictions   Weight Bearing Restrictions No     [place] residence   Living Arrangements Spouse/significant other   Type of [place] Two level     Prior Function   Level of Independence Independent   Vocation Full time employment   currently on medical leave    Vocation Requirements manages a country club, ensuring events and programs run smoothly; only occasional heavy lifting; lots of walking; also is a sommelier   Leisure until onset of symptoms, worked out at gym 5 days a week, including elliptical, free weights, and resistance exercises     Cognition   Overall Cognitive Status Within Functional Limits for tasks assessed   Behaviors Other (comment)  Polite, but flat affect, appears overwhelmed by his situatio     Observation/Other Assessments   Observations fullness visible at anteior neck    Skin Integrity no open areas      Sensation   Additional Comments pt reports numbness in his legs      Coordination   Gross Motor Movements are Fluid and Coordinated --  pt shows fatigue with repetitive movments      Functional Tests   Functional tests Sit to Stand     Sit to Stand   Comments 13 times in 30 seconds with numbness in front of legs      Posture/Postural Control   Posture/Postural Control Postural limitations   Postural Limitations Forward head;Rounded Shoulders   Posture Comments pt seeks slumped position, but is able to correct with effort      ROM / Strength   AROM / PROM / Strength AROM     AROM   Overall AROM  Deficits   Overall AROM Comments standing active trunk flexion WFL, extension 50% limited.  Pt. has pain with these movements.   Cervical Flexion --  [facility]    Cervical Extension --  [facility]   Cervical - Right Side Bend 30  pain   Cervical - Left Side Bend 50   Lumbar Flexion --  WFL    Lumbar Extension --  very limited with any extension effort due to pain    Lumbar - Right Side Bend --  WFL   Lumbar - Left Side Bend --  WFL but more pain with side bending left    Lumbar - Right Rotation --  WFL    Lumbar - Left Rotation --  [facility]      Strength   Overall Strength --  appear to have generalized fatigue and decreased strength    Right Hand Grip (lbs) 55/75/60   Left Hand Grip  (lbs) 65/62/60     Palpation   Palpation comment anterior neck swelling is fairly soft but definitely palpable  right low back muscles are moderately tight; no significant spinal malalignment     Special Tests    Special Tests --   Lumbar Tests --     other  Comments --           LYMPHEDEMA/ONCOLOGY QUESTIONNAIRE - 01/02/17 1112      Head and Neck   4 cm superior to sternal notch around neck 38.2 cm   6 cm superior to sternal notch around neck 38 cm   8 cm superior to sternal notch around neck 38 cm   Other pt has lost 40 more pounds total                   [facility] Adult PT Treatment/Exercise - 01/02/17 0001      Lumbar Exercises: Stretches   Single Knee to Chest Stretch 5 reps   Double Knee to Chest Stretch 1 rep   Pelvic Tilt 5 reps     Lumbar Exercises: Supine   Ab Set 5 reps   Clam 5 reps   Heel Slides 5 reps   Bent Knee Raise 5 reps   Other Supine Lumbar Exercises leg lengthener 5 reps with each leg. leg press 5 reps with each leg                 PT Education - 01/02/17 1238    Education provided Yes   Education Details reinforced [place] of chip pack for neck lymphedema and to monitor to see if the use of it decreased neck cramping pain, meeks decompression exercise and beginning transverse abdominus exercise    Person(s) Educated Patient   Methods Explanation;Demonstration;Handout   Comprehension Verbalized understanding;Returned demonstration           Short Term Clinic Goals - 01/02/17 1257      CC Short Term Goal  #1   Title Patient will report at least 25% decrease in right low back pain.   Time 2   Period Weeks   Status On-going     CC Short Term Goal  #2   Title Pt. will be knowledgeable about equipment (garments, pump) available for lymphedema home management.   Time 2   Period Weeks   Status On-going     CC Short Term Goal  #3   Title --   Baseline --   Time --   Period --   Status --     CC Short Term Goal  #4    Title --   Time --   Period --   Status --             Long Term Clinic Goals - 01/02/17 1300      CC Long Term Goal  #1   Title Patient will report at least 60% decrease in right low back pain for improved ADLs and function in his job.   Time 4   Period Weeks   Status On-going     CC Long Term Goal  #2   Title Pt. will be independent in self-manual lymph drainage for neck.   Time 4   Period Weeks   Status On-going     CC Long Term Goal  #3   Title pt will have 45 degrees of cervical right side bending without pain    Baseline 30 degrees with pain    Time 4   Period Weeks   Status New     CC Long Term Goal  #4   Title Independent in HEP for minimizing right low back pain.   Time 4   Period Weeks   Status On-going         Head and Neck Clinic Goals -  04/25/16 1129      Patient will be able to verbalize understanding of a home exercise program for cervical range of motion, posture, and walking.    Status Achieved     Patient will be able to verbalize understanding of proper sitting and standing posture.    Status Achieved     Patient will be able to verbalize understanding of lymphedema risk and availability of treatment for this condition.    Status Achieved           Plan - 01/02/17 1242    Clinical Impression Statement 41 yo male s/p treatment for tonsillar cancer with surgery and radiation who is having continued problems with back and headache pain along with lymphedema in his neck. He has tightness with decreased range of motion in left neck and pain with lumbar extension.  He has had continued weight loss and is not able to work at this time. Would like to try exercise, soft tissue work and Human resources officer and TENS unit to help with him with pain managment.    Rehab Potential Good   Clinical Impairments Affecting Rehab Potential completed XRT to neck in September 2017, diffculty with managment of chronic pain    PT Frequency 2x  / week   PT Duration 4 weeks   PT Treatment/Interventions ADLs/Self Care Home Management;Patient/family education;Taping;Therapeutic activities;Therapeutic exercise;Balance training;Neuromuscular re-education;Cognitive remediation;Manual techniques;Manual lymph drainage;Electrical Stimulation  Transcutaneous Electical Nerve Stimulation    PT Next Visit Plan assess use of chip pack for lymphedema (question if flexitouch may be of benefit?)  considier doing BERG or TUG for balance assessment do manual work /stretching to left posterior neck . see how pt did with supine lumbar exercise.  Teach standing 3 way hip exercise with focus on core staballizaion with hip extension.    Consulted and Agree with Plan of Care Patient      Patient will benefit from skilled therapeutic intervention in order to improve the following deficits and impairments:  Decreased activity tolerance, Decreased knowledge of precautions, Decreased knowledge of use of DME, Decreased range of motion, Increased edema, Pain, Increased muscle spasms, Impaired sensation, Decreased strength, Increased fascial restricitons, Difficulty walking, Impaired perceived functional ability, Postural dysfunction  Visit Diagnosis: Lymphedema, not elsewhere classified - Plan: PT plan of care cert/re-cert  Chronic bilateral low back pain without sciatica - Plan: PT plan of care cert/re-cert  Chronic intractable headache, unspecified headache type - Plan: PT plan of care cert/re-cert  Muscle weakness (generalized) - Plan: PT plan of care cert/re-cert     Problem List Patient Active Problem List   Diagnosis Date Noted  . Cancer of tonsillar fossa ([place]) 04/21/2016  . Mass of left side of neck 04/14/2016  . 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 Neck mass 04/14/2016   Donato Heinz. Owens Shark PT  Norwood Levo 01/02/2017, 1:04 PM  [place], [place], [address] Phone: [phone]   Fax:  [phone]  Name: [name] MRN: [id number] Date of Birth: 06-07-1976

## 2017-01-02 NOTE — Patient Instructions (Addendum)
  PELVIC TILT  Lie on back, legs bent. Exhale, tilting top of pelvis back, pubic bone up, to flatten lower back. Inhale, rolling pelvis opposite way, top forward, pubic bone down, arch in back. Repeat __10__ times. Do __2__ sessions per day. Copyright  VHI. All rights reserved.     Lie with hips and knees bent. Slowly inhale, and then exhale. Pull navel toward spine and tighten pelvic floor. Hold for __10_ seconds. Continue to breathe in and out during hold. Rest for _10__ seconds. Repeat __10_ times. Do __2-3_ times a day.   Copyright  VHI. All rights reserved.  Knee Fold   Lie on back, legs bent, arms by sides. Exhale, lifting knee to chest. Inhale, returning. Keep abdominals flat, navel to spine. Repeat __10__ times, alternating legs. Do __2__ sessions per day.  Copyright  VHI. All rights reserved.  Knee Drop   Keep pelvis stable. Without rotating hips, slowly drop knee to side, pause, return to center, bring knee across midline toward opposite hip. Feel obliques engaging. Repeat for ___10_ times each leg.  Copyright  VHI. All rights reserved.  Isometric Hold With Pelvic Floor (Hook-Lying)    Copyright  VHI. All rights reserved.  Heel Slide to Straight   Slide one leg down to straight. Return. Be sure pelvis does not rock forward, tilt, rotate, or tip to side. Do _10__ times. Restabilize pelvis. Repeat with other leg. Do __1-2_ sets, __2_ times per day.  http://ss.exer.us/16   Copyright  VHI. All rights reserved.  1. Decompression Exercise     Cancer Rehab [phone]    Lie on back on firm surface, knees bent, feet flat, arms turned up, out to sides, backs of hands down. Time _5-15__ minutes. Surface: floor   2. Shoulder Press    Start in Decompression Exercise position. Press shoulders downward towards supporting surface. Hold __2-3__ seconds while counting out loud. Repeat _3-5___ times. Do _1-2___ times per day.   3. Head Press    Bring cervical  spine (neck) into neutral position (by either tucking the chin towards the chest or tilting the chin upward). Feel weight on back of head. Press head downward into supporting surface.    Hold _2-3__ seconds. Repeat _3-5__ times. Do _1-2__ times per day.   4. Leg Lengthener    Straighten one leg. Pull toes AND forefoot toward knee, extend heel. Lengthen leg by pulling pelvis away from ribs. Hold _2-3__ seconds. Relax. Repeat __4-6__ times. Do other leg.  Surface: floor   5. Leg Press    Straighten one leg down to floor keeping leg aligned with hip. Pull toes AND forefoot toward knee; extend heel.  Press entire leg downward (as if pressing leg into sandy beach). DO NOT BEND KNEE. Hold _2-3__ seconds. Do __4-6__ times. Repeat with other leg.    

## 2017-01-04 ENCOUNTER — Ambulatory Visit: Payer: Managed Care, Other (non HMO) | Admitting: Physical Therapy

## 2017-01-04 DIAGNOSIS — I890 Lymphedema, not elsewhere classified: Secondary | ICD-10-CM | POA: Diagnosis not present

## 2017-01-04 DIAGNOSIS — M545 Low back pain: Secondary | ICD-10-CM

## 2017-01-04 DIAGNOSIS — G8929 Other chronic pain: Secondary | ICD-10-CM

## 2017-01-04 DIAGNOSIS — R51 Headache: Secondary | ICD-10-CM

## 2017-01-04 DIAGNOSIS — M6281 Muscle weakness (generalized): Secondary | ICD-10-CM

## 2017-01-04 NOTE — Therapy (Signed)
[place], [place], [address] Phone: [phone]   Fax:  [phone]  Physical Therapy Treatment  Patient Details  Name: [name] MRN: [id number] Date of Birth: 1975/12/31 Referring Provider: Dr. Isidore Moos   Encounter Date: 01/04/2017      PT End of Session - 01/04/17 1808    Visit Number 3   Number of Visits 15   Date for PT Re-Evaluation 02/12/17   PT Start Time 7416   PT Stop Time 1605   PT Time Calculation (min) 50 min   Activity Tolerance Patient limited by pain   Behavior During Therapy [facility] for tasks assessed/performed      Past Medical History:  Diagnosis Date  . Back injury due to skiing accident 2004   jet ski accident  . Cancer [facility])    tosillar, s/p radiation therapy  . GERD (gastroesophageal reflux disease)   . Mitral valve prolapse   . Weight loss     Past Surgical History:  Procedure Laterality Date  . ELBOW SURGERY Right   . KNEE SURGERY Left   . NASOPHARYNGOSCOPY Left 04/14/2016   Procedure: BASE NASAL NASOPHARYNX;  Surgeon: Jerrell Belfast, MD;  Location: [place];  Service: ENT;  Laterality: Left;  . TONSILLECTOMY Left 04/14/2016   Procedure: TONSILLECTOMY AND BIOPSY OF TONGUE ;  Surgeon: Jerrell Belfast, MD;  Location: [place];  Service: ENT;  Laterality: Left;    There were no vitals filed for this visit.      Subjective Assessment - 01/04/17 1757    Subjective Pt reports he has "twitching" of left eyelid and appears to have elevation of left eyebrow that is not voluntary.  He reports he has pain in left foot that feels like a "neuroma" He is very willing to try treatments to help him  He needs information  regarding his medical leave so "inbasket" note sent to Breck Coons to ask him to call pt.    Pertinent History Presented with left neck swelling to ED at end of May; had been fatigued prior to that.  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  PET 04/07/16 showed solitary left level 2 lymph node enlarged and hypermetabolic.  Left tonsillectomy 04/14/16.  XRT treatment completed 07/05/16.  Recently Had to stop work due to back pain, headaches and weakness in legs with continues activity.  MRIs are inconclusive as to cause of his continued problems. He reports continues weight loss now up to about 40 pounds.    Patient Stated Goals to get help with his back pain    Currently in Pain? Yes   Pain Score --  did not rate    Pain Location Back  foot, face    Pain Orientation Mid;Lower;Right   Pain Descriptors / Indicators Aching;Throbbing   Pain Type Chronic pain   Pain Radiating Towards up his back    Pain Onset More than a month ago   Pain Frequency Constant   Aggravating Factors  variable positions    Pain Relieving Factors sometimes sleeping pad    Effect of Pain on Daily Activities can't stay in one place too long                          [facility] Adult PT Treatment/Exercise - 01/04/17 0001      Self-Care  Self-Care Other Self-Care Comments   Other Self-Care Comments  pt declined flexitouch at this time      Knee/Hip Exercises: Supine   Straight Leg Raises Strengthening;Both;10 reps     Knee/Hip Exercises: Sidelying   Hip ABduction AROM;Right;5 reps   Hip ABduction Limitations pt has c/o numbness down entire leg especially at foot with inabilty to every foot or even hold it in midline  symptoms resolved when pt rolled onto back      Modalities   Modalities Electrical Stimulation  TENS     Electrical Stimulation   Electrical Stimulation Location crossed pattern on lumbar/lower thoracic area    Electrical Stimulation Action modulated II   Electrical Stimulation Parameters pt controlled intensity   Electrical Stimulation Goals Pain     Manual Therapy   Manual Therapy Myofascial  release;Manual Traction   Myofascial Release insupine to posterior cervical and forehead,   Manual Traction to posterior cervical area with 2 hands and also with one hand at occiput and one hand at sternum for counterstretch                 PT Education - 01/04/17 1807    Education provided Yes   Education Details supine straight leg raise with each leg    Person(s) Educated Patient   Methods Explanation;Demonstration   Comprehension Verbalized understanding           Short Term Clinic Goals - 01/02/17 1257      CC Short Term Goal  #1   Title Patient will report at least 25% decrease in right low back pain.   Time 2   Period Weeks   Status On-going     CC Short Term Goal  #2   Title Pt. will be knowledgeable about equipment (garments, pump) available for lymphedema home management.   Time 2   Period Weeks   Status On-going     CC Short Term Goal  #3   Title --   Baseline --   Time --   Period --   Status --     CC Short Term Goal  #4   Title --   Time --   Period --   Status --             Long Term Clinic Goals - 01/02/17 1300      CC Long Term Goal  #1   Title Patient will report at least 60% decrease in right low back pain for improved ADLs and function in his job.   Time 4   Period Weeks   Status On-going     CC Long Term Goal  #2   Title Pt. will be independent in self-manual lymph drainage for neck.   Time 4   Period Weeks   Status On-going     CC Long Term Goal  #3   Title pt will have 45 degrees of cervical right side bending without pain    Baseline 30 degrees with pain    Time 4   Period Weeks   Status New     CC Long Term Goal  #4   Title Independent in HEP for minimizing right low back pain.   Time 4   Period Weeks   Status On-going         Head and Neck Clinic Goals - 04/25/16 1129      Patient will be able to verbalize understanding of a home exercise program for cervical range of motion, posture,  and walking.     Status Achieved     Patient will be able to verbalize understanding of proper sitting and standing posture.    Status Achieved     Patient will be able to verbalize understanding of lymphedema risk and availability of treatment for this condition.    Status Achieved           Plan - 01/04/17 1808    Clinical Impression Statement Pt receptive to TENS and wants to take it home.  He was able to control the intensity today and should be able to use the unit as needed to help with pain managment.  He continues with atypical symptoms of numbness and weakness and was not able to do sidelying exercise for hip strengthening today.  He tolerated cervical traction and myofascial release well so would like to continue those.    Rehab Potential Good   Clinical Impairments Affecting Rehab Potential completed XRT to neck in September 2017, diffculty with managment of chronic pain    PT Frequency 2x / week   PT Duration 4 weeks   PT Treatment/Interventions ADLs/Self Care Home Management;Patient/family education;Taping;Therapeutic activities;Therapeutic exercise;Balance training;Neuromuscular re-education;Cognitive remediation;Manual techniques;Manual lymph drainage;Electrical Stimulation   PT Next Visit Plan Instruct in use of TENS including how to change parameters of waveform for maximal pain control and issue for homeuse.  Later  considier doing BERG or TUG for balance assessment do manual work /stretching to left posterior neck . Watch for effect of lumbar extension on symptoms.    Teach standing 3 way hip exercise with focus on core staballizaion with hip extension. May need to start with isometric exerises    Consulted and Agree with Plan of Care Patient      Patient will benefit from skilled therapeutic intervention in order to improve the following deficits and impairments:  Decreased activity tolerance, Decreased knowledge of precautions, Decreased knowledge of use of DME,  Decreased range of motion, Increased edema, Pain, Increased muscle spasms, Impaired sensation, Decreased strength, Increased fascial restricitons, Difficulty walking, Impaired perceived functional ability, Postural dysfunction  Visit Diagnosis: Lymphedema, not elsewhere classified  Chronic bilateral low back pain without sciatica  Chronic intractable headache, unspecified headache type  Muscle weakness (generalized)     Problem List Patient Active Problem List   Diagnosis Date Noted  . Cancer of tonsillar fossa ([place]) 04/21/2016  . Mass of left side of neck 04/14/2016  . 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 Neck mass 04/14/2016   Donato Heinz. Owens Shark PT  Norwood Levo 01/04/2017, [place], [place], [address] Phone: [phone]   Fax:  [phone]  Name: [name] MRN: [id number] Date of Birth: [birth year]

## 2017-01-04 NOTE — Patient Instructions (Signed)
Straight leg raise  One leg bent, other straight. For straight leg, tighen, lift only a few inches, lower leg and relax.  Strive for control of movement

## 2017-01-05 ENCOUNTER — Ambulatory Visit: Payer: Managed Care, Other (non HMO) | Admitting: Physical Therapy

## 2017-01-05 DIAGNOSIS — M545 Low back pain: Principal | ICD-10-CM

## 2017-01-05 DIAGNOSIS — I890 Lymphedema, not elsewhere classified: Secondary | ICD-10-CM | POA: Diagnosis not present

## 2017-01-05 DIAGNOSIS — G8929 Other chronic pain: Secondary | ICD-10-CM

## 2017-01-05 NOTE — Therapy (Signed)
[place], [place], [address] Phone: [phone]   Fax:  [phone]  Physical Therapy Treatment  Patient Details  Name: [name] MRN: [id number] Date of Birth: [birth year] Referring Provider: Dr. Isidore Moos   Encounter Date: 01/05/2017      PT End of Session - 01/05/17 1215    Visit Number 4   Number of Visits 15   Date for PT Re-Evaluation 02/12/17   PT Start Time 1105   PT Stop Time 1147   PT Time Calculation (min) 42 min   Activity Tolerance Patient tolerated treatment well   Behavior During Therapy [facility] for tasks assessed/performed      Past Medical History:  Diagnosis Date  . Back injury due to skiing accident 2004   jet ski accident  . Cancer [facility])    tosillar, s/p radiation therapy  . GERD (gastroesophageal reflux disease)   . Mitral valve prolapse   . Weight loss     Past Surgical History:  Procedure Laterality Date  . ELBOW SURGERY Right   . KNEE SURGERY Left   . NASOPHARYNGOSCOPY Left 04/14/2016   Procedure: BASE NASAL NASOPHARYNX;  Surgeon: Jerrell Belfast, MD;  Location: [place];  Service: ENT;  Laterality: Left;  . TONSILLECTOMY Left 04/14/2016   Procedure: TONSILLECTOMY AND BIOPSY OF TONGUE ;  Surgeon: Jerrell Belfast, MD;  Location: [place];  Service: ENT;  Laterality: Left;    There were no vitals filed for this visit.      Subjective Assessment - 01/05/17 1104    Subjective Ready to go ahead with the TENS unit.   Currently in Pain? Yes   Pain Score 3    Pain Location Head   Pain Descriptors / Indicators --  annoying   Multiple Pain Sites Yes   Pain Score 6   Pain Location Back   Pain Orientation Mid;Right   Aggravating Factors  driving car                         [facility] Adult PT Treatment/Exercise - 01/05/17 0001      Self-Care   Other Self-Care Comments  Issued patient a [employer] TENS  unit and instructed in its use.  See education section.       Acupuncturist Location mid-low thoracic back, right and left; two channels with one pair of electrodes superior and one inferior   Electrical Stimulation Parameters tried the variety of settings available on [employer] TENS unit   Electrical Stimulation Goals Pain                PT Education - 01/05/17 1214    Education provided Yes   Education Details use of TENS unit including safety, electrode placement, various settings available to him and for him to try out different settings and electrode placements; electrode care, skin care   Person(s) Educated Patient   Methods Explanation;Demonstration;Verbal cues;Handout   Comprehension Verbalized understanding;Returned demonstration           Short Term Clinic Goals - 01/02/17 1257      CC Short Term Goal  #1   Title Patient will report at least 25% decrease in right low back pain.   Time 2   Period Weeks   Status On-going     CC Short Term Goal  #2   Title Pt. will be knowledgeable about equipment (garments, pump) available for  lymphedema home management.   Time 2   Period Weeks   Status On-going     CC Short Term Goal  #3   Title --   Baseline --   Time --   Period --   Status --     CC Short Term Goal  #4   Title --   Time --   Period --   Status --             Long Term Clinic Goals - 01/02/17 1300      CC Long Term Goal  #1   Title Patient will report at least 60% decrease in right low back pain for improved ADLs and function in his job.   Time 4   Period Weeks   Status On-going     CC Long Term Goal  #2   Title Pt. will be independent in self-manual lymph drainage for neck.   Time 4   Period Weeks   Status On-going     CC Long Term Goal  #3   Title pt will have 45 degrees of cervical right side bending without pain    Baseline 30 degrees with pain    Time 4   Period Weeks   Status New      CC Long Term Goal  #4   Title Independent in HEP for minimizing right low back pain.   Time 4   Period Weeks   Status On-going         Head and Neck Clinic Goals - 04/25/16 1129      Patient will be able to verbalize understanding of a home exercise program for cervical range of motion, posture, and walking.    Status Achieved     Patient will be able to verbalize understanding of proper sitting and standing posture.    Status Achieved     Patient will be able to verbalize understanding of lymphedema risk and availability of treatment for this condition.    Status Achieved           Plan - 01/05/17 1216    Clinical Impression Statement Patient was ready to be instructed in use of TENS today and this was done.  He tried a variety of settings while in the clinic and was told about trying these at home to find the setting that does the most to help him. He was asked to bring the TENS unit back with him on next visit in case there are any questions to be addressed.   Rehab Potential Good   Clinical Impairments Affecting Rehab Potential completed XRT to neck in September 2017, diffculty with managment of chronic pain    PT Frequency 2x / week   PT Duration 4 weeks   PT Treatment/Interventions ADLs/Self Care Home Management;Patient/family education;Taping;Therapeutic activities;Therapeutic exercise;Balance training;Neuromuscular re-education;Cognitive remediation;Manual techniques;Manual lymph drainage;Electrical Stimulation   PT Next Visit Plan Assess benefit of TENS and answer any clinical questions.  Consider doing BERG or TUG for balance assessment do manual work /stretching to left posterior neck . Watch for effect of lumbar extension on symptoms.    Teach standing 3 way hip exercise with focus on core staballizaion with hip extension. May need to start with isometric exerises    Consulted and Agree with Plan of Care Patient      Patient will benefit from skilled  therapeutic intervention in order to improve the following deficits and impairments:  Decreased activity tolerance, Decreased knowledge of precautions, Decreased knowledge of  use of DME, Decreased range of motion, Increased edema, Pain, Increased muscle spasms, Impaired sensation, Decreased strength, Increased fascial restricitons, Difficulty walking, Impaired perceived functional ability, Postural dysfunction  Visit Diagnosis: Chronic bilateral low back pain without sciatica     Problem List Patient Active Problem List   Diagnosis Date Noted  . Cancer of tonsillar fossa ([place]) 04/21/2016  . Mass of left side of neck 04/14/2016  . 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 Neck mass 04/14/2016    Ellagrace Yoshida 01/05/2017, 12:19 PM  [place], [place], [address] Phone: [phone]   Fax:  [phone]  Name: [name] MRN: [id number] Date of Birth: July 12, 1976  Serafina Royals, PT 01/05/17 12:20 PM

## 2017-01-06 IMAGING — CT NM PET TUM IMG INITIAL (PI) SKULL BASE T - THIGH
1 of 8 series · 1 of 25 positions shown · non-contrast
Comparison: 03/12/2016

CLINICAL DATA: Initial treatment strategy for head and neck
carcinoma.

EXAM:
NUCLEAR MEDICINE PET SKULL BASE TO THIGH
TECHNIQUE: 8.8 mCi F-18 FDG was injected intravenously. Full-ring PET imaging
was performed from the skull base to thigh after the radiotracer. CT
data was obtained and used for attenuation correction and anatomic
localization.
FASTING BLOOD GLUCOSE:  Value: 8.8 mg/dl

[Series 4: ct [id number]_sk_th 5.0 b31f · axial · 5.0mm · 0.98mm/px · 1 of 235 slices shown]
[im 235/235  brain]
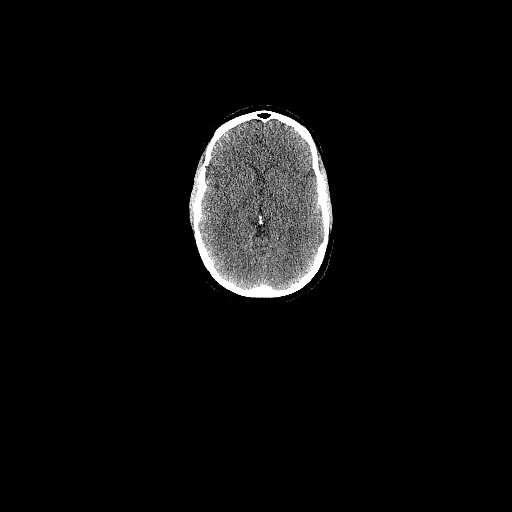

[1 of 25 positions shown; findings below may reference images not displayed]

FINDINGS: NECK

There is a hypermetabolic left level-II measuring 2.7 x 2.0 cm. SUV
max is equal to 23.74. Bilateral and symmetric increased uptake
identified within both tonsillar regions. No additional
hypermetabolic or enlarged lymph nodes within the soft tissues of
the neck.

CHEST

No hypermetabolic mediastinal or hilar nodes. No suspicious
pulmonary nodules on the CT scan.

ABDOMEN/PELVIS

No abnormal hypermetabolic activity within the liver, pancreas,
adrenal glands, or spleen. Numerous stones are identified within the
gallbladder. No hypermetabolic lymph nodes in the abdomen or pelvis.
Small periumbilical hernia contains fat only.

SKELETON

No focal hypermetabolic activity to suggest skeletal metastasis.
IMPRESSION: 1. Solitary left level-II lymph node is enlarged and exhibits
malignant range FDG uptake.
2. Symmetric uptake identified within both tonsillar regions. No
definite findings identified to suggest the primary site of disease.
3. No evidence for metastatic disease to the chest, abdomen, pelvis
or bony structures.
4. Gallstones.

## 2017-01-09 ENCOUNTER — Encounter: Payer: Self-pay | Admitting: *Deleted

## 2017-01-09 ENCOUNTER — Ambulatory Visit: Payer: Managed Care, Other (non HMO) | Admitting: Physical Therapy

## 2017-01-09 DIAGNOSIS — R519 Headache, unspecified: Secondary | ICD-10-CM

## 2017-01-09 DIAGNOSIS — I890 Lymphedema, not elsewhere classified: Secondary | ICD-10-CM | POA: Diagnosis not present

## 2017-01-09 DIAGNOSIS — R51 Headache: Secondary | ICD-10-CM

## 2017-01-09 DIAGNOSIS — M6281 Muscle weakness (generalized): Secondary | ICD-10-CM

## 2017-01-09 DIAGNOSIS — G8929 Other chronic pain: Secondary | ICD-10-CM

## 2017-01-09 DIAGNOSIS — M545 Low back pain: Principal | ICD-10-CM

## 2017-01-09 NOTE — Patient Instructions (Signed)
Knee High   Holding stable object, raise knee to hip level, then lower knee. Repeat with other knee. Complete __10_ repetitions. Do __2__ sessions per day.  ABDUCTION: Standing (Active)   Stand, feet flat. Lift right leg out to side. Use _0__ lbs. Complete __10_ repetitions. Perform __2_ sessions per day.  ADDUCTION: Standing - Stable (Active)   Stand, right leg out to side as far as possible. Draw leg in across midline. Use _0__ lbs. Complete 10_ repetitions. Perform _2__ sessions per day.       EXTENSION: Standing (Active)  Stand, both feet flat. Draw right leg behind body as far as possible. Use 0___ lbs. Complete 10 repetitions. Perform __2_ sessions per day.  Copyright  VHI. All rights reserved.   Over Head Pull: Narrow and Wide Grip   Cancer Rehab [phone]   On back, knees bent, feet flat, band across thighs, elbows straight but relaxed. Pull hands apart (start). Keeping elbows straight, bring arms up and over head, hands toward floor. Keep pull steady on band. Hold momentarily. Return slowly, keeping pull steady, back to start. Then do same with a wider grip on the band (past shoulder width) Repeat _5-10__ times. Band color __yellow____   Side Pull: Double Arm   On back, knees bent, feet flat. Arms perpendicular to body, shoulder level, elbows straight but relaxed. Pull arms out to sides, elbows straight. Resistance band comes across collarbones, hands toward floor. Hold momentarily. Slowly return to starting position. Repeat _5-10__ times. Band color _yellow____   Sword   On back, knees bent, feet flat, left hand on left hip, right hand above left. Pull right arm DIAGONALLY (hip to shoulder) across chest. Bring right arm along head toward floor. Hold momentarily. Slowly return to starting position. Repeat _5-10__ times. Do with left arm. Band color _yellow_____   Shoulder Rotation: Double Arm   On back, knees bent, feet flat, elbows tucked at sides, bent  90, hands palms up. Pull hands apart and down toward floor, keeping elbows near sides. Hold momentarily. Slowly return to starting position. Repeat _5-10__ times. Band color __yellow____

## 2017-01-09 NOTE — Therapy (Signed)
[place], [place], [address] Phone: [phone]   Fax:  [phone]  Physical Therapy Treatment  Patient Details  Name: [name] MRN: [id number] Date of Birth: 12/18/1975 Referring Provider: Dr. Isidore Moos   Encounter Date: 01/09/2017      PT End of Session - 01/09/17 1710    Visit Number 5   Number of Visits 15   Date for PT Re-Evaluation 02/12/17   PT Start Time 5188   PT Stop Time 1600   PT Time Calculation (min) 45 min   Activity Tolerance Patient tolerated treatment well   Behavior During Therapy [facility] for tasks assessed/performed      Past Medical History:  Diagnosis Date  . Back injury due to skiing accident 2004   jet ski accident  . Cancer [facility])    tosillar, s/p radiation therapy  . GERD (gastroesophageal reflux disease)   . Mitral valve prolapse   . Weight loss     Past Surgical History:  Procedure Laterality Date  . ELBOW SURGERY Right   . KNEE SURGERY Left   . NASOPHARYNGOSCOPY Left 04/14/2016   Procedure: BASE NASAL NASOPHARYNX;  Surgeon: Jerrell Belfast, MD;  Location: [place];  Service: ENT;  Laterality: Left;  . TONSILLECTOMY Left 04/14/2016   Procedure: TONSILLECTOMY AND BIOPSY OF TONGUE ;  Surgeon: Jerrell Belfast, MD;  Location: [place];  Service: ENT;  Laterality: Left;    There were no vitals filed for this visit.      Subjective Assessment - 01/09/17 1526    Subjective Pt has been using the TENS unit 8 hours a day, 30 minutes on /30 min off.  He finds that that he now has to increase the intensity to about 30 to feel it.  He is not wearing it at the current time.  He feels tired and has pain    Pertinent History Presented with left neck swelling to ED at end of May; had been fatigued prior to that.  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  PET 04/07/16 showed solitary left level 2 lymph node enlarged and hypermetabolic.  Left tonsillectomy 04/14/16.  XRT treatment completed 07/05/16.  Recently Had to stop work due to back pain, headaches and weakness in legs with continues activity.  MRIs are inconclusive as to cause of his continued problems. He reports continues weight loss now up to about 40 pounds.    Patient Stated Goals to get help with his back pain    Currently in Pain? Yes   Pain Score 5    Pain Location Back   Pain Orientation Mid;Lower;Right   Pain Descriptors / Indicators Burning   Pain Type Chronic pain   Pain Radiating Towards up his back    Pain Onset More than a month ago   Pain Frequency Constant   Aggravating Factors  vaiable positions    Pain Relieving Factors nothing, gabapentin makes him tired, but doesn't help the pain    Effect of Pain on Daily Activities cant get comfortable                          [facility] Adult PT Treatment/Exercise - 01/09/17 0001      Lumbar Exercises: Aerobic   Stationary Bike 5 minutes   left foot numb at 90 sec,  O2 sat 99,  HR 97      Lumbar Exercises: Standing   Wall Slides 10 reps   Row Strengthening;Both;5 reps   Theraband Level (Row) Level 1 (Yellow)     Lumbar Exercises: Supine   Bent Knee Raise 5 reps   Bridge 5 reps   Bridge Limitations also 5 reps with one leg elevated    Other Supine Lumbar Exercises legs in table top for small knee to chest      Knee/Hip Exercises: Standing   Hip Flexion Stengthening;Both;5 reps   Hip ADduction Strengthening;Both;5 reps   Hip Abduction Stengthening;Both;5 reps   Hip Extension Stengthening;Both;5 reps   Other Standing Knee Exercises plank against counter top    Other Standing Knee Exercises 5 push ups against counter top      Shoulder Exercises: Supine   Protraction Strengthening;Both;5 reps;Weights   Protraction Weight (lbs) 3   Horizontal ABduction Strengthening;Both;5 reps;Theraband    Theraband Level (Shoulder Horizontal ABduction) Level 1 (Yellow)   External Rotation Strengthening;Both;5 reps;Theraband   Theraband Level (Shoulder External Rotation) Level 1 (Yellow)   Flexion Strengthening;Both;5 reps;Theraband  narrow and wide grip    Theraband Level (Shoulder Flexion) Level 1 (Yellow)   Other Supine Exercises with 3#: chest press, elbow extension    Other Supine Exercises diagonal elevation with yellow theraband                    Short Term Clinic Goals - 01/02/17 1257      CC Short Term Goal  #1   Title Patient will report at least 25% decrease in right low back pain.   Time 2   Period Weeks   Status On-going     CC Short Term Goal  #2   Title Pt. will be knowledgeable about equipment (garments, pump) available for lymphedema home management.   Time 2   Period Weeks   Status On-going     CC Short Term Goal  #3   Title --   Baseline --   Time --   Period --   Status --     CC Short Term Goal  #4   Title --   Time --   Period --   Status --             Long Term Clinic Goals - 01/02/17 1300      CC Long Term Goal  #1   Title Patient will report at least 60% decrease in right low back pain for improved ADLs and function in his job.   Time 4   Period Weeks   Status On-going     CC Long Term Goal  #2   Title Pt. will be independent in self-manual lymph drainage for neck.   Time 4   Period Weeks   Status On-going     CC Long Term Goal  #3   Title pt will have 45 degrees of cervical right side bending without pain    Baseline 30 degrees with pain    Time 4   Period Weeks   Status New     CC Long Term Goal  #4   Title Independent in HEP for minimizing right low back pain.   Time 4   Period Weeks   Status On-going         Head and Neck Clinic Goals - 04/25/16 1129      Patient will be able to verbalize understanding of a home exercise program for cervical range of motion,  posture, and walking.    Status  Achieved     Patient will be able to verbalize understanding of proper sitting and standing posture.    Status Achieved     Patient will be able to verbalize understanding of lymphedema risk and availability of treatment for this condition.    Status Achieved           Plan - 01/09/17 1711    Clinical Impression Statement Pt c/o pain and fatigue today, but did very well with a variety of  exercise today in standing and supine with addition of recumbant bike. He was limited to 5 repetitions of each exericse His rate of percieved exertion for session was a moderate level.    Rehab Potential Good   Clinical Impairments Affecting Rehab Potential completed XRT to neck in September 2017, diffculty with managment of chronic pain    PT Frequency 2x / week   PT Duration 4 weeks   PT Next Visit Plan Consider doing BERG or TUG for balance assessment with addition of balance goal . do manual work /stretching to left posterior neck . Watch for effect of lumbar extension on symptoms.   Review HEP and  Consider increasing reps of exercise. Progress strengthening exercises as tolerated.    Consulted and Agree with Plan of Care Patient      Patient will benefit from skilled therapeutic intervention in order to improve the following deficits and impairments:  Decreased activity tolerance, Decreased knowledge of precautions, Decreased knowledge of use of DME, Decreased range of motion, Increased edema, Pain, Increased muscle spasms, Impaired sensation, Decreased strength, Increased fascial restricitons, Difficulty walking, Impaired perceived functional ability, Postural dysfunction  Visit Diagnosis: Chronic bilateral low back pain without sciatica  Lymphedema, not elsewhere classified  Chronic intractable headache, unspecified headache type  Muscle weakness (generalized)     Problem List Patient Active Problem List   Diagnosis Date Noted  . Cancer of tonsillar fossa ([place]) 04/21/2016   . Mass of left side of neck 04/14/2016  . 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 Neck mass 04/14/2016   Donato Heinz. Owens Shark PT  Norwood Levo 01/09/2017, 5:19 PM  [place], [place], [address] Phone: [phone]   Fax:  [phone]  Name: [name] MRN: [id number] Date of Birth: 1975-11-18

## 2017-01-10 NOTE — Progress Notes (Signed)
  [facility] Comprehensive Psychosocial Assessment Clinical Social Work  Clinical Social Work was referred by medical oncologist to address adjusting with life after cancer treatment and possible emotional concerns.  Clinical Social Worker met with patient in office to assess psychosocial, emotional, mental health, and spiritual needs of the patient.  Patient's knowledge about cancer and its treatment including level of understanding, reactions, goals for care, and expectations: [name] shared he has been experiencing significant side effects and is unsure how to address symptoms at this time.  Patient reported migraines, jaw/neck muscle "locking up", pain in back and legs, numbness in legs, memory loss, insomia, and some confusion.  The patient shared examples of memory loss/confusion he experienced at work.  CSW explored stress related to the workplace while balancing significant side effects and discussed how this can impact his emotional wellness.   Characteristics of the patient's support system: [name] shared his spouse and mother have been very supportive.  CSW briefly explored stress on the family system as reported by patient during this time.  Patient and family psychosocial functioning including strengths, limitations, and coping skills:  CSW and patient explored patient's strengths such as his strong support system, work ethic, and his "drive to get better and be stronger". He identified a main strength/coping skill in the past was physical activity, which he is unable to participate in fully at this time.  CSW reviewed possible new techniques to help cope with his current condition and process the trauma associated with cancer treatment. The patient verbalized he felt he was depressed, easily agitated, and frustrated.  CSW will explore these feelings further at next session.   Patient completed two screening tools during today's visit.  CSW will review with patient at upcoming  appointment.  -PHQ-9 Depression Scale: patient scored a 20 which indicates severe major depression. -PTSQ Posttraumatic Stress Questionnaire: patient scored a 25 (tool can be used to measure progress)         Strengths and Barriers To Treatment:   Patient Coping Strengths:   Supportive Relationships, Family and Friends                                                                                                Identified Problems/Needs and Barriers to Care:  Illness-related problems, Financial, Adjustment to Illness, Emotional/Behavioral/Cognitive, Coping/Communication and Body Image.  Counseling and Social Work Interventions and Recommendations:   Brief Counseling/Psychotherapy, Problem-solving teaching/coping strategies and Support Group/Group Counseling   Impressions/Plan:  CSW scheduled to meet with patient 01/23/17 at 1:30.  CSW will review screening tool and suggest patient discuss with PCP or psychiatrist.  CSW will explore new coping techniques and integrative therapies (massage, restorative yoga, and upcoming Head & Neck survivorship group/class).  Maryjean Morn, MSW, LCSW, OSW-C Clinical Social Worker [facility] [phone]

## 2017-01-11 ENCOUNTER — Ambulatory Visit: Payer: Managed Care, Other (non HMO) | Admitting: Physical Therapy

## 2017-01-16 ENCOUNTER — Ambulatory Visit: Payer: Managed Care, Other (non HMO) | Attending: Radiation Oncology | Admitting: Physical Therapy

## 2017-01-16 ENCOUNTER — Encounter: Payer: Self-pay | Admitting: *Deleted

## 2017-01-16 ENCOUNTER — Telehealth: Payer: Self-pay | Admitting: Physical Therapy

## 2017-01-16 DIAGNOSIS — R51 Headache: Secondary | ICD-10-CM | POA: Insufficient documentation

## 2017-01-16 DIAGNOSIS — M6281 Muscle weakness (generalized): Secondary | ICD-10-CM | POA: Insufficient documentation

## 2017-01-16 DIAGNOSIS — I890 Lymphedema, not elsewhere classified: Secondary | ICD-10-CM | POA: Insufficient documentation

## 2017-01-16 DIAGNOSIS — M545 Low back pain: Secondary | ICD-10-CM | POA: Insufficient documentation

## 2017-01-16 DIAGNOSIS — G8929 Other chronic pain: Secondary | ICD-10-CM | POA: Insufficient documentation

## 2017-01-16 NOTE — Telephone Encounter (Signed)
Pt did not come to his appointment today and did not call to cancel.  He did cancel last Thursday's appointment because he had too much pain and we have not heard from his since.  I tried to call him twice, but there was no answer and his voice mailbox is not set up so I could not leave a message.  Maudry Diego, PT 01/16/2017@ 4:00 PM

## 2017-01-17 NOTE — Progress Notes (Signed)
[place] Work  Clinical Social Work scheduled to meet with patient for follow up counseling session.  When patient appeared for appointment, he notified CSW he was "in pain and almost cancelled and had spouse and dog waiting in parking lot".  Patient and CSW met briefly for 25 minutes.  [name] provided brief update on managing physical side effects and plan to return to work due to no short-term disability coverage.  CSW validated patients feelings of stress and briefly reviewed possible coping strategies as discussed last session.  CSW and patient engaged in guided imagery exercise- patient reported he as unable to fully engage due to pain in ear, headache, and numbness/tingling in his feet.  CSW reviewed PHQ-9 scale and discussed medication management + talk therapy for treating depression/adjusting to survivorship related changes.  CSW provided patient with referals for primary care (in [place]) and a local psychiatric practice.  CSW also provided more information on meditation techniques/CD and free yoga/meditation at [facility].  Patient did not schedule follow up sessions with CSW.  Patient aware CSW is available as needed to provide support.   Maryjean Morn, MSW, LCSW, OSW-C Clinical Social Worker [facility] [phone]

## 2017-01-18 ENCOUNTER — Ambulatory Visit: Payer: Managed Care, Other (non HMO) | Admitting: Physical Therapy

## 2017-01-18 DIAGNOSIS — R51 Headache: Secondary | ICD-10-CM | POA: Diagnosis present

## 2017-01-18 DIAGNOSIS — M6281 Muscle weakness (generalized): Secondary | ICD-10-CM | POA: Diagnosis present

## 2017-01-18 DIAGNOSIS — I890 Lymphedema, not elsewhere classified: Secondary | ICD-10-CM

## 2017-01-18 DIAGNOSIS — G8929 Other chronic pain: Secondary | ICD-10-CM | POA: Diagnosis present

## 2017-01-18 DIAGNOSIS — M545 Low back pain: Principal | ICD-10-CM

## 2017-01-18 NOTE — Therapy (Signed)
[place], [place], [address] Phone: [phone]   Fax:  [phone]  Physical Therapy Treatment  Patient Details  Name: [name] MRN: [id number] Date of Birth: Jun 14, 1976 Referring Provider: Dr. Isidore Moos   Encounter Date: 01/18/2017      PT End of Session - 01/18/17 1740    Visit Number 5   Number of Visits 15   Date for PT Re-Evaluation 02/12/17   PT Start Time 9678   PT Stop Time 1600   PT Time Calculation (min) 45 min   Activity Tolerance Patient tolerated treatment well   Behavior During Therapy [facility] for tasks assessed/performed      Past Medical History:  Diagnosis Date  . Back injury due to skiing accident 2004   jet ski accident  . Cancer [facility])    tosillar, s/p radiation therapy  . GERD (gastroesophageal reflux disease)   . Mitral valve prolapse   . Weight loss     Past Surgical History:  Procedure Laterality Date  . ELBOW SURGERY Right   . KNEE SURGERY Left   . NASOPHARYNGOSCOPY Left 04/14/2016   Procedure: BASE NASAL NASOPHARYNX;  Surgeon: Jerrell Belfast, MD;  Location: [place];  Service: ENT;  Laterality: Left;  . TONSILLECTOMY Left 04/14/2016   Procedure: TONSILLECTOMY AND BIOPSY OF TONGUE ;  Surgeon: Jerrell Belfast, MD;  Location: [place];  Service: ENT;  Laterality: Left;    There were no vitals filed for this visit.      Subjective Assessment - 01/18/17 1520    Subjective Pt went to the gym this morning and rode the bike for 25 minutes  He is using the TENS unit for back pain and has been wearing the chip pack and also a tight shirt that compresses up to his neck so he does not feel he is having swellling at this   Pertinent History Presented with left neck swelling to ED at end of May; had been fatigued prior to that.  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  Left tonsil cancer p16+,  possible right tonsil cancer, stage III.  PET 04/07/16 showed solitary left level 2 lymph node enlarged and hypermetabolic.  Left tonsillectomy 04/14/16.  XRT treatment completed 07/05/16.  Recently Had to stop work due to back pain, headaches and weakness in legs with continues activity.  MRIs are inconclusive as to cause of his continued problems. He reports continues weight loss now up to about 40 pounds.    Patient Stated Goals to get help with his back pain    Currently in Pain? Yes   Pain Score 9   shooting through neck and up to head                          [facility] Adult PT Treatment/Exercise - 01/18/17 0001      Manual Therapy   Manual Therapy Myofascial release;Manual Lymphatic Drainage (MLD);Passive ROM;Manual Traction   Myofascial Release insupine to posterior cervical and forehead,   Manual Lymphatic Drainage (MLD) insupine , short neck, shoulder collectors and pectoral nodes, anterior throat and lateral throat, submental nodes going down toward chest, cheeks, forehead with extra time spent on left face to try to affect change in pain in ear,    Passive ROM to cervical area in supine for rotation and lateral flexion    Manual Traction to posterior cervical area with 2 hands  and also with one hand at occiput and one hand at sternum for counterstretch                 PT Education - 01/18/17 1740    Education provided Yes   Education Details talked to pateint about aquatic therapy availabe and [place] and possilby getting his care there since it is closer to his home    Person(s) Educated Patient   Methods Explanation   Comprehension Verbalized understanding           Short Term Clinic Goals - 01/02/17 1257      CC Short Term Goal  #1   Title Patient will report at least 25% decrease in right low back pain.   Time 2   Period Weeks   Status On-going     CC Short Term Goal  #2   Title Pt. will be knowledgeable about equipment (garments, pump)  available for lymphedema home management.   Time 2   Period Weeks   Status On-going     CC Short Term Goal  #3   Title --   Baseline --   Time --   Period --   Status --     CC Short Term Goal  #4   Title --   Time --   Period --   Status --             Long Term Clinic Goals - 01/02/17 1300      CC Long Term Goal  #1   Title Patient will report at least 60% decrease in right low back pain for improved ADLs and function in his job.   Time 4   Period Weeks   Status On-going     CC Long Term Goal  #2   Title Pt. will be independent in self-manual lymph drainage for neck.   Time 4   Period Weeks   Status On-going     CC Long Term Goal  #3   Title pt will have 45 degrees of cervical right side bending without pain    Baseline 30 degrees with pain    Time 4   Period Weeks   Status New     CC Long Term Goal  #4   Title Independent in HEP for minimizing right low back pain.   Time 4   Period Weeks   Status On-going         Head and Neck Clinic Goals - 04/25/16 1129      Patient will be able to verbalize understanding of a home exercise program for cervical range of motion, posture, and walking.    Status Achieved     Patient will be able to verbalize understanding of proper sitting and standing posture.    Status Achieved     Patient will be able to verbalize understanding of lymphedema risk and availability of treatment for this condition.    Status Achieved           Plan - 01/18/17 1741    Clinical Impression Statement Pt continues to have pain in head , face, ear and back  He went to the gym this morning so treatment today focues on manual therapy for head pain.  used MLD to try to decreased pain in ear and encouraged pt to do TMJ range of motion exercises.  He could not tell if he had improvement immedicately after session.  He did have mild lymphedema below chin, but it was not  measured,    Rehab Potential Good   Clinical  Impairments Affecting Rehab Potential completed XRT to neck in September 2017, diffculty with managment of chronic pain    PT Duration 4 weeks   PT Next Visit Plan do manual work /stretching to left posterior neck . Watch for effect of lumbar extension on symptoms.   Teach standing isometrics to legs and arms for back extension stregnthening. Review HEP and  Consider increasing reps of exercise. Progress strengthening exercises as tolerated.    Consulted and Agree with Plan of Care Patient      Patient will benefit from skilled therapeutic intervention in order to improve the following deficits and impairments:  Decreased activity tolerance, Decreased knowledge of precautions, Decreased knowledge of use of DME, Decreased range of motion, Increased edema, Pain, Increased muscle spasms, Impaired sensation, Decreased strength, Increased fascial restricitons, Difficulty walking, Impaired perceived functional ability, Postural dysfunction  Visit Diagnosis: Chronic bilateral low back pain without sciatica  Lymphedema, not elsewhere classified  Chronic intractable headache, unspecified headache type  Muscle weakness (generalized)     Problem List Patient Active Problem List   Diagnosis Date Noted  . Cancer of tonsillar fossa ([place]) 04/21/2016  . Mass of left side of neck 04/14/2016  . 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 Neck mass 04/14/2016   Donato Heinz. Owens Shark PT  Norwood Levo 01/18/2017, 5:45 PM  [place], [place], [address] Phone: [phone]   Fax:  [phone]  Name: Takuma Cifelli MRN: [id number] Date of Birth: 1975/12/17

## 2017-01-21 ENCOUNTER — Other Ambulatory Visit: Payer: Self-pay | Admitting: Radiation Oncology

## 2017-01-23 ENCOUNTER — Ambulatory Visit: Payer: Managed Care, Other (non HMO) | Admitting: Physical Therapy

## 2017-01-23 DIAGNOSIS — G8929 Other chronic pain: Secondary | ICD-10-CM

## 2017-01-23 DIAGNOSIS — M545 Low back pain, unspecified: Secondary | ICD-10-CM

## 2017-01-23 DIAGNOSIS — I890 Lymphedema, not elsewhere classified: Secondary | ICD-10-CM

## 2017-01-23 DIAGNOSIS — M6281 Muscle weakness (generalized): Secondary | ICD-10-CM

## 2017-01-23 DIAGNOSIS — R51 Headache: Secondary | ICD-10-CM

## 2017-01-23 NOTE — Therapy (Signed)
[place], [place], [address] Phone: [phone]   Fax:  [phone]  Physical Therapy Treatment  Patient Details  Name: [name] MRN: [id number] Date of Birth: Jan 18, 1976 Referring Provider: Dr. Isidore Moos   Encounter Date: 01/23/2017      PT End of Session - 01/23/17 1733    Visit Number 6   Number of Visits 15   Date for PT Re-Evaluation 02/12/17   PT Start Time 1520   PT Stop Time 1605   PT Time Calculation (min) 45 min   Activity Tolerance Patient tolerated treatment well   Behavior During Therapy [facility] for tasks assessed/performed      Past Medical History:  Diagnosis Date  . Back injury due to skiing accident 2004   jet ski accident  . Cancer [facility])    tosillar, s/p radiation therapy  . GERD (gastroesophageal reflux disease)   . Mitral valve prolapse   . Weight loss     Past Surgical History:  Procedure Laterality Date  . ELBOW SURGERY Right   . KNEE SURGERY Left   . NASOPHARYNGOSCOPY Left 04/14/2016   Procedure: BASE NASAL NASOPHARYNX;  Surgeon: Jerrell Belfast, MD;  Location: [place];  Service: ENT;  Laterality: Left;  . TONSILLECTOMY Left 04/14/2016   Procedure: TONSILLECTOMY AND BIOPSY OF TONGUE ;  Surgeon: Jerrell Belfast, MD;  Location: [place];  Service: ENT;  Laterality: Left;    There were no vitals filed for this visit.      Subjective Assessment - 01/23/17 1531    Subjective Pt has been going to the gym and riding the bike for aobut 30 minutes.  He has been trying to do some pulldowns and has to do biceps exercise in sitting, because he notices his legs get numb with standing. He reports he will have to return to work for at least 30 hour a week as his short and long term disability policies were cancelled  He noticed increasd headache pain , sensations at neck after his heart rate went up while exercising at the gym today and googled his  symtoms.  He is asking about the possibltiy of carotid stenosis.?? I referred him to ask his question to Dr. Isidore Moos or Dr. Leta Baptist    Pertinent History Presented with left neck swelling to ED at end of May; had been fatigued prior to that.  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  PET 04/07/16 showed solitary left level 2 lymph node enlarged and hypermetabolic.  Left tonsillectomy 04/14/16.  XRT treatment completed 07/05/16.  Recently Had to stop work due to back pain, headaches and weakness in legs with continues activity.  MRIs are inconclusive as to cause of his continued problems. He reports continues weight loss now up to about 40 pounds.    Patient Stated Goals to get help with his back pain    Currently in Pain? Yes   Pain Score 10-Worst pain ever  comes in cramps, usually  5-6    Pain Location --  back and headache                          [facility] Adult PT Treatment/Exercise - 01/23/17 0001      Manual Therapy   Manual Therapy Myofascial release;Manual Lymphatic Drainage (MLD);Passive ROM;Manual Traction   Myofascial Release insupine to posterior  cervical and forehead,   Manual Lymphatic Drainage (MLD) insupine , short neck, shoulder collectors and pectoral nodes, anterior throat and lateral throat, submental nodes going down toward chest, cheeks, forehead with extra time spent on left face to try to affect change in pain in ear,    Passive ROM to cervical area in supine for rotation and lateral flexion    Manual Traction to posterior cervical area with 2 hands and also with one hand at occiput and one hand at sternum for counterstretch                 PT Education - 01/23/17 1732    Education provided Yes   Education Details encouraged pt to continue with posterior body strengthening at the gym including  standing LE isometic hip extension and  bilateral UE rows   Person(s) Educated Patient   Methods Explanation   Comprehension Verbalized understanding           Short Term Clinic Goals - 01/02/17 1257      CC Short Term Goal  #1   Title Patient will report at least 25% decrease in right low back pain.   Time 2   Period Weeks   Status On-going     CC Short Term Goal  #2   Title Pt. will be knowledgeable about equipment (garments, pump) available for lymphedema home management.   Time 2   Period Weeks   Status On-going     CC Short Term Goal  #3   Title --   Baseline --   Time --   Period --   Status --     CC Short Term Goal  #4   Title --   Time --   Period --   Status --             Long Term Clinic Goals - 01/02/17 1300      CC Long Term Goal  #1   Title Patient will report at least 60% decrease in right low back pain for improved ADLs and function in his job.   Time 4   Period Weeks   Status On-going     CC Long Term Goal  #2   Title Pt. will be independent in self-manual lymph drainage for neck.   Time 4   Period Weeks   Status On-going     CC Long Term Goal  #3   Title pt will have 45 degrees of cervical right side bending without pain    Baseline 30 degrees with pain    Time 4   Period Weeks   Status New     CC Long Term Goal  #4   Title Independent in HEP for minimizing right low back pain.   Time 4   Period Weeks   Status On-going         Head and Neck Clinic Goals - 04/25/16 1129      Patient will be able to verbalize understanding of a home exercise program for cervical range of motion, posture, and walking.    Status Achieved     Patient will be able to verbalize understanding of proper sitting and standing posture.    Status Achieved     Patient will be able to verbalize understanding of lymphedema risk and availability of treatment for this condition.    Status Achieved           Plan - 01/23/17 1733    Clinical Impression Statement Pt  has been going to the gym for general strengthening and using his TENS for back pain control so focus of our treatment has been on his head and neck pain. He has palpable tightness at left lateral neck, but is getting good release with manual techniques.  Encouraged pt to continue TMJ exercises He is concerned about his impending return to work.    Rehab Potential Good   Clinical Impairments Affecting Rehab Potential completed XRT to neck in September 2017, diffculty with managment of chronic pain    PT Frequency 2x / week   PT Duration 4 weeks   PT Treatment/Interventions ADLs/Self Care Home Management;Patient/family education;Taping;Therapeutic activities;Therapeutic exercise;Balance training;Neuromuscular re-education;Cognitive remediation;Manual techniques;Manual lymph drainage;Electrical Stimulation   PT Next Visit Plan do manual work /stretching to left posterior neck . Watch for effect of lumbar extension on symptoms.   Teach standing isometrics to legs and arms for back extension stregnthening. Review HEP and  Consider increasing reps of exercise. Progress strengthening exercises as tolerated.    Consulted and Agree with Plan of Care Patient      Patient will benefit from skilled therapeutic intervention in order to improve the following deficits and impairments:  Decreased activity tolerance, Decreased knowledge of precautions, Decreased knowledge of use of DME, Decreased range of motion, Increased edema, Pain, Increased muscle spasms, Impaired sensation, Decreased strength, Increased fascial restricitons, Difficulty walking, Impaired perceived functional ability, Postural dysfunction  Visit Diagnosis: Chronic bilateral low back pain without sciatica  Lymphedema, not elsewhere classified  Chronic intractable headache, unspecified headache type  Muscle weakness (generalized)  Acute right-sided low back pain without sciatica     Problem List Patient Active Problem List   Diagnosis  Date Noted  . Cancer of tonsillar fossa ([employer]) 04/21/2016  . Mass of left side of neck 04/14/2016  . 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 Neck mass 04/14/2016   Donato Heinz. Owens Shark PT  Norwood Levo 01/23/2017, 5:47 PM  [place], [place], [address] Phone: [phone]   Fax:  [phone]  Name: [name] MRN: [id number] Date of Birth: [birth year]

## 2017-01-25 ENCOUNTER — Ambulatory Visit: Payer: Managed Care, Other (non HMO) | Admitting: Physical Therapy

## 2017-01-30 ENCOUNTER — Ambulatory Visit: Payer: Managed Care, Other (non HMO) | Admitting: Physical Therapy

## 2017-01-30 DIAGNOSIS — M545 Low back pain, unspecified: Secondary | ICD-10-CM

## 2017-01-30 DIAGNOSIS — G8929 Other chronic pain: Secondary | ICD-10-CM

## 2017-01-30 DIAGNOSIS — I890 Lymphedema, not elsewhere classified: Secondary | ICD-10-CM

## 2017-01-30 DIAGNOSIS — M6281 Muscle weakness (generalized): Secondary | ICD-10-CM

## 2017-01-30 DIAGNOSIS — R51 Headache: Secondary | ICD-10-CM

## 2017-01-30 DIAGNOSIS — R519 Headache, unspecified: Secondary | ICD-10-CM

## 2017-01-30 NOTE — Therapy (Signed)
[place], [place], [address] Phone: [phone]   Fax:  [phone]  Physical Therapy Treatment  Patient Details  Name: [name] MRN: [id number] Date of Birth: 1976-07-11 Referring Provider: Dr. Isidore Moos   Encounter Date: 01/30/2017      PT End of Session - 01/30/17 1758    Activity Tolerance Patient tolerated treatment well   Behavior During Therapy [facility] for tasks assessed/performed      Past Medical History:  Diagnosis Date  . Back injury due to skiing accident 2004   jet ski accident  . Cancer [facility])    tosillar, s/p radiation therapy  . GERD (gastroesophageal reflux disease)   . Mitral valve prolapse   . Weight loss     Past Surgical History:  Procedure Laterality Date  . ELBOW SURGERY Right   . KNEE SURGERY Left   . NASOPHARYNGOSCOPY Left 04/14/2016   Procedure: BASE NASAL NASOPHARYNX;  Surgeon: Jerrell Belfast, MD;  Location: [place];  Service: ENT;  Laterality: Left;  . TONSILLECTOMY Left 04/14/2016   Procedure: TONSILLECTOMY AND BIOPSY OF TONGUE ;  Surgeon: Jerrell Belfast, MD;  Location: [place];  Service: ENT;  Laterality: Left;    There were no vitals filed for this visit.      Subjective Assessment - 01/30/17 1746    Subjective Keelon reports that he feels stronger since he has been going to the gym, but he still has significant back pain and headaches every day. He has more neck pain after the manual traction last session.  He feels tinging in his neck, and up to his eye and over his head, all on the left side.  He has started to feel the same feeling down his left arm into his hand. He does not feel the gabapentin is helping him.  He uses the TENS unit for his back pain, but he still feels the pain as soon as he takes the unit off.  He feels like he can do the strengthening on his own at the gym and wants to work on the head and neck pain while he  is here.    Pertinent History Presented with left neck swelling to ED at end of May; had been fatigued prior to that.  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  PET 04/07/16 showed solitary left level 2 lymph node enlarged and hypermetabolic.  Left tonsillectomy 04/14/16.  XRT treatment completed 07/05/16.  Recently Had to stop work due to back pain, headaches and weakness in legs with continues activity.  MRIs are inconclusive as to cause of his continued problems. He reports continues weight loss now up to about 40 pounds.    Patient Stated Goals to get help with his back pain and neck pain    Currently in Pain? Yes   Pain Score --  did not rate today , but says he has pain    Pain Location --  back and head , left arm                          [facility] Adult PT Treatment/Exercise - 01/30/17 0001      Exercises   Exercises Other Exercises   Other Exercises  encouraged pt to continue jaw opening and TMJ exercises  and to do one legged stance, squats, lunges and heel  raises at the gym      Manual Therapy   Manual Therapy Soft tissue mobilization;Myofascial release;Scapular mobilization   Soft tissue mobilization to left upper trap, cervical muscles,    Myofascial Release in supine and right sidelying at left side of head near ear,.prolonged pressure at signficant trigger points in left upper trapezius, levator, and posterior cervical muscles.    Scapular Mobilization inferior glides of left scapula in sidelying for upper trap tretch    Manual Lymphatic Drainage (MLD) to superficial tissue at anterior and alteral neck and around left ear                   Short Term Clinic Goals - 01/02/17 1257      CC Short Term Goal  #1   Title Patient will report at least 25% decrease in right low back pain.   Time 2   Period Weeks   Status On-going     CC Short Term  Goal  #2   Title Pt. will be knowledgeable about equipment (garments, pump) available for lymphedema home management.   Time 2   Period Weeks   Status On-going     CC Short Term Goal  #3   Title --   Baseline --   Time --   Period --   Status --     CC Short Term Goal  #4   Title --   Time --   Period --   Status --             Long Term Clinic Goals - 01/02/17 1300      CC Long Term Goal  #1   Title Patient will report at least 60% decrease in right low back pain for improved ADLs and function in his job.   Time 4   Period Weeks   Status On-going     CC Long Term Goal  #2   Title Pt. will be independent in self-manual lymph drainage for neck.   Time 4   Period Weeks   Status On-going     CC Long Term Goal  #3   Title pt will have 45 degrees of cervical right side bending without pain    Baseline 30 degrees with pain    Time 4   Period Weeks   Status New     CC Long Term Goal  #4   Title Independent in HEP for minimizing right low back pain.   Time 4   Period Weeks   Status On-going         Head and Neck Clinic Goals - 04/25/16 1129      Patient will be able to verbalize understanding of a home exercise program for cervical range of motion, posture, and walking.    Status Achieved     Patient will be able to verbalize understanding of proper sitting and standing posture.    Status Achieved     Patient will be able to verbalize understanding of lymphedema risk and availability of treatment for this condition.    Status Achieved           Plan - 01/30/17 1759    Clinical Impression Statement Pt continues with back and headache pain associated with muscle fasciculations and tingling that seem to be in the CN VII distribution.  Pt asked that I send a note to Dr. Leta Baptist on his behalf as he does not feel the gabapentin is helping him.  He has significant muscle  tightness with trigger points in his upper trap and paracervical  muscles today  that seemed to decrease, but not fully dissipate with treatment today    Rehab Potential Good   Clinical Impairments Affecting Rehab Potential completed XRT to neck in September 2017, diffculty with managment of chronic pain    PT Next Visit Plan do manual work /stretching to left posterior neck .continue with scapular mobilziaion and work on trigger point.s    Consulted and Agree with Plan of Care Patient      Patient will benefit from skilled therapeutic intervention in order to improve the following deficits and impairments:  Decreased activity tolerance, Decreased knowledge of precautions, Decreased knowledge of use of DME, Decreased range of motion, Increased edema, Pain, Increased muscle spasms, Impaired sensation, Decreased strength, Increased fascial restricitons, Difficulty walking, Impaired perceived functional ability, Postural dysfunction  Visit Diagnosis: Chronic bilateral low back pain without sciatica  Chronic intractable headache, unspecified headache type  Muscle weakness (generalized)  Lymphedema, not elsewhere classified     Problem List Patient Active Problem List   Diagnosis Date Noted  . Cancer of tonsillar fossa ([place]) 04/21/2016  . Mass of left side of neck 04/14/2016  . 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 Neck mass 04/14/2016   Donato Heinz. Owens Shark PT  Norwood Levo 01/30/2017, 6:04 PM  [place], [place], [address] Phone: [phone]   Fax:  [phone]  Name: [name] MRN: [id number] Date of Birth: [birth year]

## 2017-02-01 ENCOUNTER — Ambulatory Visit: Payer: Managed Care, Other (non HMO) | Admitting: Physical Therapy

## 2017-02-01 DIAGNOSIS — M545 Low back pain: Principal | ICD-10-CM

## 2017-02-01 DIAGNOSIS — R51 Headache: Secondary | ICD-10-CM

## 2017-02-01 DIAGNOSIS — M6281 Muscle weakness (generalized): Secondary | ICD-10-CM

## 2017-02-01 DIAGNOSIS — G8929 Other chronic pain: Secondary | ICD-10-CM

## 2017-02-01 NOTE — Therapy (Signed)
[place], [place], [address] Phone: [phone]   Fax:  [phone]  Physical Therapy Treatment  Patient Details  Name: [name] MRN: [id number] Date of Birth: 26-Dec-1975 Referring Provider: Dr. Isidore Moos   Encounter Date: 02/01/2017      PT End of Session - 02/01/17 1727    Visit Number 8   Number of Visits 15   Date for PT Re-Evaluation 02/12/17   PT Start Time 4970   PT Stop Time 1600   PT Time Calculation (min) 45 min   Activity Tolerance Patient tolerated treatment well   Behavior During Therapy [facility] for tasks assessed/performed      Past Medical History:  Diagnosis Date  . Back injury due to skiing accident 2004   jet ski accident  . Cancer [facility])    tosillar, s/p radiation therapy  . GERD (gastroesophageal reflux disease)   . Mitral valve prolapse   . Weight loss     Past Surgical History:  Procedure Laterality Date  . ELBOW SURGERY Right   . KNEE SURGERY Left   . NASOPHARYNGOSCOPY Left 04/14/2016   Procedure: BASE NASAL NASOPHARYNX;  Surgeon: Jerrell Belfast, MD;  Location: [place];  Service: ENT;  Laterality: Left;  . TONSILLECTOMY Left 04/14/2016   Procedure: TONSILLECTOMY AND BIOPSY OF TONGUE ;  Surgeon: Jerrell Belfast, MD;  Location: [place];  Service: ENT;  Laterality: Left;    There were no vitals filed for this visit.      Subjective Assessment - 02/01/17 1519    Subjective Pt reports he had a really bad headache yesterday that lasted about an hour. He also complains of very frequent muscle twitching in front of neck, around mouth, eye and up onto top of head.  He is only getting temporary relief from treatment, if any at all.  He feels that this shoulde be his last treatment. He has been going to the gym and is pleased he has gained a few pounds. He has noticed that he has less muscle at his left upper trap. He is used to exercising them  at the gym as he did before his diagnosis and he notices he cannot get his muscle to work like he wants it to.  He is not getting any relief from the TENS unit and will send it back as he is not using it anymore.    Pertinent History Presented with left neck swelling to ED at end of May; had been fatigued prior to that.  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  PET 04/07/16 showed solitary left level 2 lymph node enlarged and hypermetabolic.  Left tonsillectomy 04/14/16.  XRT treatment completed 07/05/16.  Recently Had to stop work due to back pain, headaches and weakness in legs with continues activity.  MRIs are inconclusive as to cause of his continued problems. He reports continues weight loss now up to about 40 pounds.    Patient Stated Goals to get help with his back pain and neck pain    Currently in Pain? Yes   Pain Score --  does not rate, but sometimes has 10 pain   Pain Location --  back head left arm    Pain Descriptors / Indicators Burning;Headache;Tingling;Crushing  also nearly constant muscle twitching    Pain Type Chronic pain   Pain Radiating Towards down arm, up to  top of head mostly all on left side    Pain Onset More than a month ago   Pain Frequency Constant   Aggravating Factors  can't say    Pain Relieving Factors nothing    Effect of Pain on Daily Activities can't get comfortable for long                          [facility] Adult PT Treatment/Exercise - 02/01/17 0001      Manual Therapy   Manual Therapy Soft tissue mobilization;Myofascial release;Scapular mobilization   Soft tissue mobilization to left upper trap, cervical muscles,    Myofascial Release in supine and right sidelying at left side of head near ear,.prolonged pressure at signficant trigger points in left upper trapezius, levator, and posterior cervical muscles.    Scapular  Mobilization inferior glides of left scapula in sidelying for upper trap tretch    Manual Lymphatic Drainage (MLD) to superficial tissue at anterior and alteral neck and around left ear                   Short Term Clinic Goals - 02/01/17 1735      CC Short Term Goal  #1   Title Patient will report at least 25% decrease in right low back pain.   Status Not Met     CC Short Term Goal  #2   Title Pt. will be knowledgeable about equipment (garments, pump) available for lymphedema home management.   Status Achieved             Long Term Clinic Goals - 02/01/17 1521      CC Long Term Goal  #1   Title Patient will report at least 60% decrease in right low back pain for improved ADLs and function in his job.   Baseline TENS unit has not been effective    Status Not Met     CC Long Term Goal  #2   Title Pt. will be independent in self-manual lymph drainage for neck.   Status Achieved     CC Long Term Goal  #3   Title pt will have 45 degrees of cervical right side bending without pain    Baseline 30 degrees with pain on eval,  pt has 45 degreees but does have pain and pulling with only 20 degrees    Status Partially Met     CC Long Term Goal  #4   Title Independent in HEP for minimizing right low back pain.   Status Achieved         Head and Neck Clinic Goals - 04/25/16 1129      Patient will be able to verbalize understanding of a home exercise program for cervical range of motion, posture, and walking.    Status Achieved     Patient will be able to verbalize understanding of proper sitting and standing posture.    Status Achieved     Patient will be able to verbalize understanding of lymphedema risk and availability of treatment for this condition.    Status Achieved           Plan - 02/01/17 1727    Clinical Impression Statement Pt has not been getting relieft with PT that has focused mainly on treatment of the persistent muscle  spasm and tightness he has in his left cervical muslces. He does not present with lymphedema but does have pain, nearly constant low grade muscle twitching that  seems to be in the distrbution of the facial nerve and upper trapezius muscle atropy with weakness. He has been exercising at the gym.   He plans to contact Dr. Leta Baptist and Dr. Isidore Moos for more advice about what could be the cause of his pain He does not feel he has been gettting relief from his current medications, but noted today that he has not  tried  a muscle relaxor medication. He does not want to continue with PT at this time.     Rehab Potential Good   Clinical Impairments Affecting Rehab Potential completed XRT to neck in September 2017, diffculty with managment of chronic pain    PT Frequency 2x / week   PT Duration 4 weeks   PT Treatment/Interventions ADLs/Self Care Home Management;Patient/family education;Taping;Therapeutic activities;Therapeutic exercise;Balance training;Neuromuscular re-education;Cognitive remediation;Manual techniques;Manual lymph drainage;Electrical Stimulation   PT Next Visit Plan discharge this episode    Consulted and Agree with Plan of Care Patient      Patient will benefit from skilled therapeutic intervention in order to improve the following deficits and impairments:  Decreased activity tolerance, Decreased knowledge of precautions, Decreased knowledge of use of DME, Decreased range of motion, Increased edema, Pain, Increased muscle spasms, Impaired sensation, Decreased strength, Increased fascial restricitons, Difficulty walking, Impaired perceived functional ability, Postural dysfunction  Visit Diagnosis: Chronic bilateral low back pain without sciatica  Chronic intractable headache, unspecified headache type  Muscle weakness (generalized)     Problem List Patient Active Problem List   Diagnosis Date Noted  . Cancer of tonsillar fossa ([place]) 04/21/2016  . Mass of left side of neck  04/14/2016  . 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 Neck mass 04/14/2016    PHYSICAL THERAPY DISCHARGE SUMMARY  Visits from Start of Care: 8  Current functional level related to goals / functional outcomes: As above    Remaining deficits: As above   Education / Equipment: Self manual lymph drainage, exericse  Plan: Patient agrees to discharge.  Patient goals were partially met. Patient is being discharged due to lack of progress.  ?????     Donato Heinz. Owens Shark PT  Norwood Levo 02/01/2017, 5:36 PM  [place], [place], [address] Phone: [phone]   Fax:  [phone]  Name: [name] MRN: [id number] Date of Birth: June 25, 1976

## 2017-02-09 ENCOUNTER — Encounter: Payer: Self-pay | Admitting: Adult Health

## 2017-02-13 ENCOUNTER — Encounter: Payer: Self-pay | Admitting: *Deleted

## 2017-02-13 NOTE — Progress Notes (Signed)
Mr. Fier presents for follow up of radiation completed 07/05/16 to his Tonsils and bilateral neck.   Pain issues, if any: He reports chronic pain to his back. He continues to have pain to his Left neck which radiates to his Left ear and Left eye. He has headaches often. He also reports pain that is severe to this area when he turns his head to the right. He still experiences cramping to his Left neck and sometimes to his Right neck. He did see PT, but doesn't feel like it helped overall. He also tried a TENS unit without relief. He also reports a chronic sore throat which is worse in the morning.  Using a feeding tube?: No Weight changes, if any:  Wt Readings from Last 3 Encounters:  02/14/17 147 lb (66.7 kg)  01/01/17 146 lb (66.2 kg)  12/22/16 147 lb 6.4 oz (66.9 kg)   Swallowing issues, if any: He tells me that he needs to moisten his food to swallow. Large pills also will get stuck. He has a water bottle with him where ever he goes.  Smoking or chewing tobacco? No Using fluoride trays daily? Yes Last ENT visit was on: 11/20/16 Dr. Wilburn Cornelia.  Other notable issues, if any:  He is requesting a letter to return to work today.   BP 122/86   Pulse 73   Temp 98.1 F (36.7 C)   Wt 147 lb (66.7 kg)   SpO2 100%   BMI 21.09 kg/m

## 2017-02-14 ENCOUNTER — Ambulatory Visit
Admission: RE | Admit: 2017-02-14 | Discharge: 2017-02-14 | Disposition: A | Discharge status: Home / Self Care | Payer: Managed Care, Other (non HMO) | Source: Ambulatory Visit | Attending: Radiation Oncology | Admitting: Radiation Oncology

## 2017-02-14 ENCOUNTER — Encounter: Payer: Self-pay | Admitting: Radiation Oncology

## 2017-02-14 VITALS — BP 122/86 | HR 73 | Temp 98.1°F | Wt 147.0 lb

## 2017-02-14 DIAGNOSIS — C090 Malignant neoplasm of tonsillar fossa: Secondary | ICD-10-CM | POA: Diagnosis present

## 2017-02-14 DIAGNOSIS — M542 Cervicalgia: Secondary | ICD-10-CM | POA: Insufficient documentation

## 2017-02-14 DIAGNOSIS — R51 Headache: Secondary | ICD-10-CM | POA: Diagnosis not present

## 2017-02-14 DIAGNOSIS — Z79899 Other long term (current) drug therapy: Secondary | ICD-10-CM | POA: Insufficient documentation

## 2017-02-14 MED ORDER — CYCLOBENZAPRINE HCL 5 MG PO TABS: 5.0000 mg | ORAL_TABLET | Freq: Three times a day (TID) | ORAL | 0 refills | Status: DC

## 2017-02-14 NOTE — Progress Notes (Addendum)
Radiation Oncology         [phone] ________________________________  Name: [name] MRN: [id number]  Date: 02/14/2017  DOB: [birth year]  Follow-Up Visit Note  CC: Donnie Coffin, MD  Jerrell Belfast, MD  Diagnosis and Prior Radiotherapy:       ICD-9-CM ICD-10-CM   1. 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  He had an MRI of the brain on 12/22/16, lumbar spine on 12/24/16, and cervical/thoracic spine on 3/15/18and cervical spine on 12/28/16 for continued pain to his back and left temple regions. 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 MRI of the lumbar spine was negative. MRI of the cervical and thoracic spine showed expected post radiation changes to the bone marrow from the skullbase to the T4 level, post radiation changes to the neck soft tissues including mild retropharyngeal effusion, chronic disc and acute on chronic endplate degeneration at C6-C7 (associated moderate to severe left greater than right C7 neural foraminal stenosis, but no associated cervical spinal stenosis), a mild chronic T4 compression fracture is unchanged since the presentation neck CT in May 2017, and nmetastatic disease.  The patient continues to report  chronic right sided back pain and left neck pain which radiates to his left ear and left eye. He is only taking Gabapentin and he is no longer on Amitriptyline when it ran out. He also reports pain that is severe to this area when he turns his head to the right. He still experiences cramping of his left neck and sometimes of his right neck. He did see PT, but doesn't feel like it helped overall. He also tried a TENS unit without relief. He has headaches. He also reports a chronic sore throat which is worse in the morning. He is fatigued and also has lower extremity weakness, lower extremity intermittent shooting pain, and decreased sensation in his legs/feet, left trapezius muscle atrophy, and difficulty with short term memory. He has left sided headaches. He has an 8 lb weight loss since December 2017. He needs to moisten his food to swallow. Large pills get stuck in his throat. His throat gets dry easily and he has to carry bottled water with him. He is using fluoride trays daily. He is not working at this time and is requesting a letter to return to work. His symptoms began after he completed radiation.  The patient consulted with Dr. 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 He advised to continue amitriptyline at bedtime, added gabapentin, continue PT, improve nutrition and physical activity, use ibuprofen/tylenol PRN for headaches and back pain, and may consider EMG/NCS in the future. Pt self-stopped amitriptyline.    He feels ready to return to work,  and has been working out at the gym to improve his stamina.  Neurologic Symptoms have not resolved, but he seems better today.    The patient saw Dr. Wilburn Cornelia, ENT, on 11/20/16 and no evidence of disease was seen on clinical exam.  ALLERGIES:  has No Known Allergies.  Meds: Current Outpatient Prescriptions    Medication Sig Dispense Refill  . gabapentin (NEURONTIN) 300 MG capsule Take 1 capsule (300 mg total) by mouth 2 (two) times daily. 60 capsule 6  . ranitidine (ZANTAC) 150 MG capsule Take 150 mg by mouth as needed for heartburn.    . sodium fluoride (FLUORISHIELD) 1.1 % GEL dental gel Instill one drop of gel per tooth space of fluoride tray. Place over teeth for 5 minutes. Remove. Spit out excess. Repeat nightly. 120 mL prn  . amitriptyline (ELAVIL) 25 MG tablet Take 1 tablet (25 mg total) by mouth at bedtime. Increase to 2 tablets, at bedtime after 1 week, if tolerated and if you don't have complete resolution of symptoms. (Patient not taking: Reported on 02/14/2017) 60 tablet 0  . cyclobenzaprine (FLEXERIL) 5 MG tablet Take 1 tablet (5 mg total) by mouth 3 (three) times daily. Muscle Relaxant. 63 tablet 0  . HYDROcodone-acetaminophen (NORCO) 7.5-325 MG tablet Take 1 tablet by mouth 4 (four) times daily as needed for moderate pain. (Patient not taking: Reported on 02/14/2017) 120 tablet 0  . lidocaine (XYLOCAINE) 2 % solution Mix 1 part 2% viscous lidocaine,1 part H2O. Swallow +/- swish 62mL of this mixture, 37min before meals and at bedtime, up to QID (Patient not taking: Reported on 02/14/2017) 100 mL 5   No current facility-administered medications for this encounter.     Physical Findings: The patient is in no acute distress. Patient is alert and oriented. Wt Readings from Last 3 Encounters:  02/14/17 147 lb (66.7 kg)  01/01/17 146 lb (66.2 kg)  12/22/16 147 lb 6.4 oz (66.9 kg)    weight is 147 lb (66.7 kg). His temperature is 98.1 F (36.7 C). His blood pressure is 122/86 and his pulse is 73. His oxygen saturation is 100%.     General: Alert and oriented, in no acute distress HEENT: Head is normocephalic. (+) Left eyebrow moves more than his right eyebrow when he speaks and it's slightly more raised at baseline. Looking at his driver's license, slightly evident in February 2017. Extraocular  movements are intact. Oropharynx is clear. (+) Mucous membranes slightly dry. Neck: Neck is supple, no palpable cervical or supraclavicular lymphadenopathy. Extremities: No cyanosis or edema. Lymphatics: see Neck Exam Skin: No concerning lesions. Musculoskeletal: (+) Muscle atrophy of the left trapezius. Neurologic:   Speech is fluent. See above. Psychiatric: Judgment and insight are intact. Affect is appropriate.    Lab Findings: Lab Results  Component Value Date   WBC 4.7 08/21/2016   HGB 12.8 (L) 08/21/2016   HCT 36.8 (L) 08/21/2016   MCV 86.6 08/21/2016   PLT 207 08/21/2016   CMP     Component Value Date/Time   NA 139 08/21/2016 1405   K 4.5 08/21/2016 1405   CL 104 03/12/2016 0345   CL 103 10/22/2012 0146   CO2 29 08/21/2016 1405   GLUCOSE 93 08/21/2016 1405   BUN 10.8 08/21/2016 1405   CREATININE 0.8 08/21/2016 1405   CALCIUM 9.9 08/21/2016 1405   PROT 7.3 08/21/2016 1405   ALBUMIN 4.2 08/21/2016 1405   AST 15 08/21/2016 1405   ALT 12 08/21/2016 1405   ALKPHOS 48 08/21/2016  1405   BILITOT 2.17 (H) 08/21/2016 1405   GFRNONAA >60 03/12/2016 0345   GFRNONAA >60 10/22/2012 0146   GFRAA >60 03/12/2016 0345   GFRAA >60 10/22/2012 0146    Lab Results  Component Value Date   TSH 1.451 10/11/2016    Radiographic Findings:   No results found.  Impression/Plan:   Head and Neck Cancer Status:  No evidence of cancer. Continuing neurologic symptoms. Patient's affect is better today, he acknowledges symptoms aren't fully resolved.  He is still certainly affected by them, but feels ready to return to work.  Based on the way the patient is describing his neurologic symptoms of his neck and atrophy of the left trapezius muscle, it is possible radiation has caused rare nerve damage in that area. However, radiation is a localized process and would not typically cause lower back/extremity symptoms. Cord appears normal on MRI.   In addition to continuing Gabapentin, I will  also prescribe a 3 week supply of Flexeril. If the patient's symptoms improve, then he could get refills by Dr. Leta Baptist. He is scheduled to follow up with neurology on 03/07/17 for possible further testing.  Consider rheumatology referral at discretion of neurologist if satisfactory dx not achieved.  I also wrote a work letter for the patient to return to work and emphasized the importance of continued follow ups. The patient should use discretion in performing strenuous physical activities.  The patient will follow up with me in 3 months and he also scheduled to see ENT around that time. I spent over 30 minutes face to face w/ patient, over 50% of time on counseling and care coordination   _____________________________________   Eppie Gibson, MD  This document serves as a record of services personally performed by Eppie Gibson, MD. It was created on her behalf by Darcus Austin, a trained medical scribe. The creation of this record is based on the scribe's personal observations and the provider's statements to them. This document has been checked and approved by the attending provider.

## 2017-02-17 ENCOUNTER — Other Ambulatory Visit: Payer: Self-pay | Admitting: Radiation Oncology

## 2017-02-19 NOTE — Telephone Encounter (Signed)
Request refill 

## 2017-02-28 ENCOUNTER — Telehealth: Payer: Self-pay | Admitting: *Deleted

## 2017-02-28 NOTE — Telephone Encounter (Signed)
Attempted to reach patient to reschedule FU on 03/07/17; dr will be out of office. Voice mailbox not set up.

## 2017-03-01 ENCOUNTER — Encounter: Payer: Self-pay | Admitting: *Deleted

## 2017-03-01 NOTE — Telephone Encounter (Signed)
Attempted to reach patient again. No answer, voice mailbox not set up. Will mail letter.

## 2017-03-06 ENCOUNTER — Ambulatory Visit (INDEPENDENT_AMBULATORY_CARE_PROVIDER_SITE_OTHER): Payer: Managed Care, Other (non HMO) | Admitting: Adult Health

## 2017-03-06 ENCOUNTER — Encounter: Payer: Self-pay | Admitting: Adult Health

## 2017-03-06 VITALS — BP 137/79 | HR 97 | Ht 71.0 in | Wt 149.6 lb

## 2017-03-06 DIAGNOSIS — R202 Paresthesia of skin: Secondary | ICD-10-CM | POA: Diagnosis not present

## 2017-03-06 DIAGNOSIS — G43019 Migraine without aura, intractable, without status migrainosus: Secondary | ICD-10-CM | POA: Diagnosis not present

## 2017-03-06 MED ORDER — TOPIRAMATE 25 MG PO TABS: ORAL_TABLET | ORAL | 3 refills | Status: DC

## 2017-03-06 NOTE — Patient Instructions (Signed)
Decrease gabapentin to 600 mg in the morning and 300 mg at bedtime Start Topamax 25 mg at bedtime for 1 week then increase to 50 mg at bedtime Nerve conduction studies If your symptoms worsen or you develop new symptoms please let us know.   Topiramate tablets What is this medicine? TOPIRAMATE (toe PYRE a mate) is used to treat seizures in adults or children with epilepsy. It is also used for the prevention of migraine headaches. This medicine may be used for other purposes; ask your health care provider or pharmacist if you have questions. COMMON BRAND NAME(S): Topamax, Topiragen What should I tell my health care provider before I take this medicine? They need to know if you have any of these conditions: -bleeding disorders -cirrhosis of the liver or liver disease -diarrhea -glaucoma -kidney stones or kidney disease -low blood counts, like low white cell, platelet, or red cell counts -lung disease like asthma, obstructive pulmonary disease, emphysema -metabolic acidosis -on a ketogenic diet -schedule for surgery or a procedure -suicidal thoughts, plans, or attempt; a previous suicide attempt by you or a family member -an unusual or allergic reaction to topiramate, other medicines, foods, dyes, or preservatives -pregnant or trying to get pregnant -breast-feeding How should I use this medicine? Take this medicine by mouth with a glass of water. Follow the directions on the prescription label. Do not crush or chew. You may take this medicine with meals. Take your medicine at regular intervals. Do not take it more often than directed. Talk to your pediatrician regarding the use of this medicine in children. Special care may be needed. While this drug may be prescribed for children as young as 34 years of age for selected conditions, precautions do apply. Overdosage: If you think you have taken too much of this medicine contact a poison control center or emergency room at once. NOTE: This  medicine is only for you. Do not share this medicine with others. What if I miss a dose? If you miss a dose, take it as soon as you can. If your next dose is to be taken in less than 6 hours, then do not take the missed dose. Take the next dose at your regular time. Do not take double or extra doses. What may interact with this medicine? Do not take this medicine with any of the following medications: -probenecid This medicine may also interact with the following medications: -acetazolamide -alcohol -amitriptyline -aspirin and aspirin-like medicines -birth control pills -certain medicines for depression -certain medicines for seizures -certain medicines that treat or prevent blood clots like warfarin, enoxaparin, dalteparin, apixaban, dabigatran, and rivaroxaban -digoxin -hydrochlorothiazide -lithium -medicines for pain, sleep, or muscle relaxation -metformin -methazolamide -NSAIDS, medicines for pain and inflammation, like ibuprofen or naproxen -pioglitazone -risperidone This list may not describe all possible interactions. Give your health care provider a list of all the medicines, herbs, non-prescription drugs, or dietary supplements you use. Also tell them if you smoke, drink alcohol, or use illegal drugs. Some items may interact with your medicine. What should I watch for while using this medicine? Visit your doctor or health care professional for regular checks on your progress. Do not stop taking this medicine suddenly. This increases the risk of seizures if you are using this medicine to control epilepsy. Wear a medical identification bracelet or chain to say you have epilepsy or seizures, and carry a card that lists all your medicines. This medicine can decrease sweating and increase your body temperature. Watch for signs of deceased  sweating or fever, especially in children. Avoid extreme heat, hot baths, and saunas. Be careful about exercising, especially in hot weather. Contact  your health care provider right away if you notice a fever or decrease in sweating. You should drink plenty of fluids while taking this medicine. If you have had kidney stones in the past, this will help to reduce your chances of forming kidney stones. If you have stomach pain, with nausea or vomiting and yellowing of your eyes or skin, call your doctor immediately. You may get drowsy, dizzy, or have blurred vision. Do not drive, use machinery, or do anything that needs mental alertness until you know how this medicine affects you. To reduce dizziness, do not sit or stand up quickly, especially if you are an older patient. Alcohol can increase drowsiness and dizziness. Avoid alcoholic drinks. If you notice blurred vision, eye pain, or other eye problems, seek medical attention at once for an eye exam. The use of this medicine may increase the chance of suicidal thoughts or actions. Pay special attention to how you are responding while on this medicine. Any worsening of mood, or thoughts of suicide or dying should be reported to your health care professional right away. This medicine may increase the chance of developing metabolic acidosis. If left untreated, this can cause kidney stones, bone disease, or slowed growth in children. Symptoms include breathing fast, fatigue, loss of appetite, irregular heartbeat, or loss of consciousness. Call your doctor immediately if you experience any of these side effects. Also, tell your doctor about any surgery you plan on having while taking this medicine since this may increase your risk for metabolic acidosis. Birth control pills may not work properly while you are taking this medicine. Talk to your doctor about using an extra method of birth control. Women who become pregnant while using this medicine may enroll in the [place] Pregnancy Registry by calling [phone]. This registry collects information about the safety of antiepileptic  drug use during pregnancy. What side effects may I notice from receiving this medicine? Side effects that you should report to your doctor or health care professional as soon as possible: -allergic reactions like skin rash, itching or hives, swelling of the face, lips, or tongue -decreased sweating and/or rise in body temperature -depression -difficulty breathing, fast or irregular breathing patterns -difficulty speaking -difficulty walking or controlling muscle movements -hearing impairment -redness, blistering, peeling or loosening of the skin, including inside the mouth -tingling, pain or numbness in the hands or feet -unusual bleeding or bruising -unusually weak or tired -worsening of mood, thoughts or actions of suicide or dying Side effects that usually do not require medical attention (report to your doctor or health care professional if they continue or are bothersome): -altered taste -back pain, joint or muscle aches and pains -diarrhea, or constipation -headache -loss of appetite -nausea -stomach upset, indigestion -tremors This list may not describe all possible side effects. Call your doctor for medical advice about side effects. You may report side effects to FDA at 1-800-FDA-1088. Where should I keep my medicine? Keep out of the reach of children. Store at room temperature between 15 and 30 degrees C (59 and 86 degrees F) in a tightly closed container. Protect from moisture. Throw away any unused medicine after the expiration date. NOTE: This sheet is a summary. It may not cover all possible information. If you have questions about this medicine, talk to your doctor, pharmacist, or health care provider.  2018 [employer] Standard (  2013-10-06 23:17:57)  

## 2017-03-06 NOTE — Progress Notes (Signed)
PATIENT: [name] DOB: 1975-11-17  REASON FOR VISIT: follow up- cancer of the tonsil fossa, migraine headaches and paresthesias. HISTORY FROM: patient  HISTORY OF PRESENT ILLNESS: [name] is a 41 year old male with a history of cancer of the tonsil fossa, migraine headaches and paresthesias. He returns today for follow-up. He reports that he discontinued amitriptyline as it was not beneficial. He states that he is currently taking gabapentin 600 mg twice a day. He reports he has not seen much change in his symptoms since he started gabapentin. He reports that he has a sensation that starts in the left side of the neck and travels down the left shoulder and down the arm. He describes this as a trembling sensation. He does not describe this as an electrical type pain. He states this happens on a daily basis and is essentially there continuously. He reports that his oncologist has noticed muscle atrophy in the trapezius muscle on the left side. Patient reports that he can move the left arm a certain way and it feels as if his trapezius muscle spasms and this elicits pain.   He is having  3-4 headaches a week. He states that the pain starts at the neck on the left side and radiates behind the left ear into the left eye. He states that the left side of his head will feel numb. He does have photophobia, phonophobia but denies nausea and vomiting. He states these episodes last anywhere from 1 hour to 6 hours. He does try taking Excedrin migraine and caffeine when he has the headaches. Reports he also has a bouncing sensation in the left eye that started during radiation. He reports that he can be looking out his window at home staring at a log and it'll look as if it's moving up and down but only with the left eye.   He also reports ongoing back pain. He states that when he initially came to the office he was having intermittent numbness, burning and tingling in the lower extremities. He also felt  that his legs were weak. He states that this has gotten slightly better although he still has the pins and needle sensations in the legs intermittently. He does report that the more he works out it seems that the symptoms may be getting better although he is unsure if it may be gabapentin that helping. He returns today for an evaluation.   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 Isidore Moos 04/21/16), status post left tonsillectomy, nasopharyngeal biopsies, base of tongue biopsy in June 2017, status post intensity modulated radiation therapy from August until September 2017, here for evaluation of constellation of symptoms according headache, back pain, numbness, pain, cramping.  Patient had intermittent sore throat sensation from 2014 2 2015. He went to PCP for evaluation, treated empirically for upper respiratory infection and strep throat. In March 2017 patient noted a small mass on the left side of his throat. One day he woke up sweating and confused. He followed up at the emergency room for evaluation, was set up with ENT evaluation and suited had diagnosis of left tonsil squamous cell carcinoma. Patient was treated with left tonsillectomy, and then several rounds of radiation therapy to the tonsils and bilateral neck.  During this time patient continued to eat by mouth and was supplemented with boost shakes. However he gradually  lost approximate 40 pounds of weight during his treatment. Patient weight declined from 185 down to 145 pounds.  Around the same timeframe patient started to have intermittent shooting electrical sensations in his legs, arms, body. Patient had intermittent headaches, typically left-sided, associated with photophobia and nausea. He has several of these headaches per week up to 3 per week. He also has lower grade daily  headache.  Patient also having intermittent right lower back pain, sometimes radiating to his right leg. Patient has an episodes of exercise-induced numbness and weakness in the bilateral lower extremities. He describes intermittent right sided foot drop.  Patient having more anxiety, depression, insomnia, memory loss and confusion. His sleep and insomnia issues are mainly related to uncomfortable sensation in the evening and bedtime.  Patient was treated with fentanyl previously, and due to concern of possible opioid side effect this was gradually weaned off. However his symptoms did not improve. He has used small course of hydrocodone with Tylenol for back pain without relief. One week ago he was started on amitriptyline to help with insomnia and nerve pain. He has not noticed benefit yet.  Patient had MRI of the brain, cervical spine, thoracic spine and lumbar spine which I reviewed. No significant abnormalities to explain his neurologic symptoms at this time.  Patient also had lab testing to look for alternate causes of neuropathy which were unremarkable.   REVIEW OF SYSTEMS: Out of a complete 14 system review of symptoms, the patient complains only of the following symptoms, and all other reviewed systems are negative.  Back pain, neck pain, neck stiffness, frequent waking, daytime sleepiness, runny nose, fatigue, light sensitivity, muscle loss, headache, numbness, tremors or behavioral problem, confusion, decreased concentration  ALLERGIES: No Known Allergies  HOME MEDICATIONS: Outpatient Medications Prior to Visit  Medication Sig Dispense Refill  . cyclobenzaprine (FLEXERIL) 5 MG tablet Take 1 tablet (5 mg total) by mouth 3 (three) times daily. Muscle Relaxant. (Patient taking differently: Take 5 mg by mouth 3 times/day as needed-between meals & bedtime. Muscle Relaxant.) 63 tablet 0  . gabapentin (NEURONTIN) 300 MG capsule Take 1 capsule (300 mg total) by mouth 2 (two) times  daily. (Patient taking differently: Take 600 mg by mouth 2 (two) times daily. ) 60 capsule 6  . HYDROcodone-acetaminophen (NORCO) 7.5-325 MG tablet Take 1 tablet by mouth 4 (four) times daily as needed for moderate pain. 120 tablet 0  . ranitidine (ZANTAC) 150 MG capsule Take 150 mg by mouth as needed for heartburn.    . sodium fluoride (FLUORISHIELD) 1.1 % GEL dental gel Instill one drop of gel per tooth space of fluoride tray. Place over teeth for 5 minutes. Remove. Spit out excess. Repeat nightly. 120 mL prn  . amitriptyline (ELAVIL) 25 MG tablet Take 1 tablet (25 mg total) by mouth at bedtime. Increase to 2 tablets, at bedtime after 1 week, if tolerated and if you don't have complete resolution of symptoms. (Patient not taking: Reported on 02/14/2017) 60 tablet 0  . lidocaine (XYLOCAINE) 2 % solution Mix 1 part 2% viscous lidocaine,1 part H2O. Swallow +/- swish 20mL of this mixture, 65min before meals and at bedtime, up to QID (Patient not taking: Reported on 02/14/2017) 100 mL 5   No facility-administered medications prior to visit.     PAST MEDICAL HISTORY: Past Medical History:  Diagnosis Date  . Back injury due to skiing accident 2004   jet ski accident  . Cancer [facility])    tosillar, s/p radiation therapy  .  GERD (gastroesophageal reflux disease)   . History of radiation therapy 05/22/2016 - 07/05/2016   Tonsils and bilateral neck 70 Gy 35 fractions to gross disease, 63 Gy 35 fractions to high risk nodal echelons, 56 Gy 35 fractions to intermediate risk nodal echelons  . Mitral valve prolapse   . Weight loss     PAST SURGICAL HISTORY: Past Surgical History:  Procedure Laterality Date  . ELBOW SURGERY Right   . KNEE SURGERY Left   . NASOPHARYNGOSCOPY Left 04/14/2016   Procedure: BASE NASAL NASOPHARYNX;  Surgeon: Jerrell Belfast, MD;  Location: [facility];  Service: ENT;  Laterality: Left;  . TONSILLECTOMY Left 04/14/2016   Procedure: TONSILLECTOMY AND BIOPSY OF TONGUE ;   Surgeon: Jerrell Belfast, MD;  Location: [facility];  Service: ENT;  Laterality: Left;    FAMILY HISTORY: Family History  Problem Relation Age of Onset  . Cancer Mother        Breast  . Cancer Father        Lung  . Cancer Brother        brain  . Cancer Maternal Grandfather        leukemia    SOCIAL HISTORY: Social History   Social History  . Marital status: Married    Spouse name: N/A  . Number of children: 0  . Years of education: 12   Occupational History  .      [employer]   Social History Main Topics  . Smoking status: Never Smoker  . Smokeless tobacco: Former Systems developer  . Alcohol use 0.0 oz/week     Comment: social: Research scientist (life sciences) at [employer]  . Drug use: No  . Sexual activity: Not on file   Other Topics Concern  . Not on file   Social History Narrative   Lives with wife   caffeine , rare      PHYSICAL EXAM  Vitals:   03/06/17 1331  BP: 137/79  Pulse: 97  Weight: 149 lb 9.6 oz (67.9 kg)  Height: 5\' 11"  (1.803 m)   Body mass index is 20.86 kg/m.  Generalized: Well developed, in no acute distress   Neurological examination  Mentation: Alert oriented to time, place, history taking. Follows all commands speech and language fluent Cranial nerve II-XII: Pupils were equal round reactive to light. Extraocular movements were full, visual field were full on confrontational test. Facial sensation and strength were normal. Uvula tongue midline. Head turning and shoulder shrug  were normal and symmetric. Motor: The motor testing reveals 5 over 5 strength of all 4 extremities. Good symmetric motor tone is noted throughout. Decreased muscle mass in the left trapezius muscle Sensory: Sensory testing is intact to soft touch on all 4 extremities. No evidence of extinction is noted.  Coordination: Cerebellar testing reveals good finger-nose-finger and heel-to-shin bilaterally.  Gait and station: Gait is normal.  Tandem gait is normal. Romberg is negative. No drift is seen.  Reflexes: Deep tendon reflexes are symmetric and normal bilaterally.   DIAGNOSTIC DATA (LABS, IMAGING, TESTING) - I reviewed patient records, labs, notes, testing and imaging myself where available.  Lab Results  Component Value Date   WBC 4.7 08/21/2016   HGB 12.8 (L) 08/21/2016   HCT 36.8 (L) 08/21/2016   MCV 86.6 08/21/2016   PLT 207 08/21/2016      Component Value Date/Time   NA 139 08/21/2016 1405   K 4.5 08/21/2016 1405   CL 104 03/12/2016 0345  CL 103 10/22/2012 0146   CO2 29 08/21/2016 1405   GLUCOSE 93 08/21/2016 1405   BUN 10.8 08/21/2016 1405   CREATININE 0.8 08/21/2016 1405   CALCIUM 9.9 08/21/2016 1405   PROT 7.3 08/21/2016 1405   ALBUMIN 4.2 08/21/2016 1405   AST 15 08/21/2016 1405   ALT 12 08/21/2016 1405   ALKPHOS 48 08/21/2016 1405   BILITOT 2.17 (H) 08/21/2016 1405   GFRNONAA >60 03/12/2016 0345   GFRNONAA >60 10/22/2012 0146   GFRAA >60 03/12/2016 0345   GFRAA >60 10/22/2012 0146   Lab Results  Component Value Date   KXFGHWEX93 716 12/22/2016   Lab Results  Component Value Date   TSH 1.451 10/11/2016      ASSESSMENT AND PLAN 41 y.o. year old male  has a past medical history of Back injury due to skiing accident (2004); Cancer [facility]); GERD (gastroesophageal reflux disease); History of radiation therapy (05/22/2016 - 07/05/2016); Mitral valve prolapse; and Weight loss. here with:  1. Cancer of tonsillar fossa 2. Migraine headaches 3. Paresthesias  The patient continues to have 3-4 headaches a week. We will initiate Topamax 25 mg at bedtime for 1 week then increasing to 50 mg thereafter. He will decrease gabapentin to 300 mg in the evening but continue 600 mg in the morning. I'll also set the patient for nerve conduction studies with EMG regarding paresthesias in the left arm and bilateral lower extremities. Patient is advised that if his symptoms worsen or he develops new symptoms he  should let us know. I reviewed side effects of Topamax with the patient. If the patient begins to have cognitive side effects with Topamax we will have to consider a another medication. Patient voiced understanding. He will follow-up in 3-4 months with Dr. Leta Baptist.       Ward Givens, MSN, NP-C 03/06/2017, 1:44 PM [facility] [address] [place], [place] [address] [phone]

## 2017-03-06 NOTE — Progress Notes (Signed)
I reviewed note and agree with plan.   Penni Bombard, MD 3/53/6144, 3:15 PM Certified in Neurology, Neurophysiology and Neuroimaging  [facility] [address] [place], [place] [address] [phone]

## 2017-03-07 ENCOUNTER — Ambulatory Visit: Payer: Managed Care, Other (non HMO) | Admitting: Diagnostic Neuroimaging

## 2017-03-15 ENCOUNTER — Encounter (INDEPENDENT_AMBULATORY_CARE_PROVIDER_SITE_OTHER): Payer: Self-pay | Admitting: Diagnostic Neuroimaging

## 2017-03-15 ENCOUNTER — Ambulatory Visit (INDEPENDENT_AMBULATORY_CARE_PROVIDER_SITE_OTHER): Payer: Managed Care, Other (non HMO) | Admitting: Diagnostic Neuroimaging

## 2017-03-15 DIAGNOSIS — R202 Paresthesia of skin: Secondary | ICD-10-CM | POA: Diagnosis not present

## 2017-03-15 DIAGNOSIS — Z0289 Encounter for other administrative examinations: Secondary | ICD-10-CM

## 2017-03-16 NOTE — Procedures (Signed)
[facility]  NCS (NERVE CONDUCTION STUDY) WITH EMG (ELECTROMYOGRAPHY) REPORT   STUDY DATE: 03/15/17 PATIENT NAME: [name] DOB: 02-Aug-1976 MRN: [id number]  ORDERING CLINICIAN: Ward Givens, NP / Andrey Spearman, MD   TECHNOLOGIST: Oneita Jolly ELECTROMYOGRAPHER: Earlean Polka. Penumalli, MD  CLINICAL INFORMATION: 41 year old male with tonsillar cancer status post radiation, with left trapezius atrophy, numbness in left hand and bilateral feet. Evaluate for neuropathy.  FINDINGS: NERVE CONDUCTION STUDY: Left median, left ulnar, left peroneal and left tibial motor responses are normal. Left ulnar and left tibial F wave latencies are normal.  Right spinal accessory nerve motor response is normal. Left spinal accessory motor response could not be obtained.   NEEDLE ELECTROMYOGRAPHY: Needle examination of left upper extremity deltoid, biceps, triceps, flexor carpi radialis, first dorsal interosseous is normal.  Left trapezius demonstrates 1+ fibrillation potentials, positive sharp waves, occasional fasciculations at rest and decreased motor unit recruitment on exertion.  Left lower cervical paraspinal muscles (C7-T1) demonstrate 1+ fibrillation potentials and occasional fasciculations at rest.   IMPRESSION:  Abnormal study demonstrating: 1. 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 2. Left C7-T1 cervical root spontaneous activity may reflect underlying nerve root irritation. 3. No evidence of underlying widespread large fiber polyneuropathy.    INTERPRETING PHYSICIAN:  Penni Bombard, MD Certified in Neurology, Neurophysiology and Neuroimaging  [facility] [address] [place], [place] [address] [phone]   [facility]    Nerve / Sites Muscle Latency Ref. Amplitude Ref. Rel Amp Segments Distance Velocity Ref. Area    ms ms mV mV %  cm m/s m/s mVms  L Median - APB       Wrist APB 3.4 ?4.4 7.1 ?4.0 100 Wrist - APB 7   34.5     Upper arm APB 7.8  6.9  97.6 Upper arm - Wrist 24 54 ?49 34.7  L Ulnar - ADM     Wrist ADM 2.6 ?3.3 10.9 ?6.0 100 Wrist - ADM 7   35.3     B.Elbow ADM 6.2  9.9  90.6 B.Elbow - Wrist 20 55 ?49 31.8     A.Elbow ADM 8.2  9.9  99.7 A.Elbow - B.Elbow 10 51 ?49 31.9     Axilla ADM 11.6  9.4  95 Axilla - A.Elbow 17 50  32.1     Supraclav fossa ADM 14.1  7.5  79.9 Supraclav fossa - Axilla 14 56  26.2         A.Elbow - Wrist      L Peroneal - EDB     Ankle EDB 5.5 ?6.5 3.6 ?2.0 100 Ankle - EDB 9   11.3     Fib head EDB 12.7  3.5  97.4 Fib head - Ankle 32 45 ?44 12.6     Pop fossa EDB 14.7  3.0  84.9 Pop fossa - Fib head 10 49 ?44 9.3         Pop fossa - Ankle      L Tibial - AH     Ankle AH 4.1 ?5.8 23.4 ?4.0 100 Ankle - AH 9   46.4     Pop fossa AH 12.7  14.9  63.9 Pop fossa - Ankle 40 47 ?41 37.5  R Accessory (spinal) - Trapezius     Neck Trapezius 3.4  6.0  100     46.0  L Accessory (spinal) - Trapezius     Neck Trapezius NR  NR  NR     NR                 SNC    Nerve / Sites Rec. Site Peak Lat Ref.  Amp Ref. Segments Distance    ms ms V V  cm  L Radial - Anatomical snuff box (Forearm)     Forearm Wrist 2.4 ?2.9 25 ?15 Forearm - Wrist 10  L Sural - Ankle (Calf)     Calf Ankle 3.3 ?4.4 6 ?6 Calf - Ankle 14  L Superficial peroneal - Ankle     Lat leg Ankle 3.4 ?4.4 9 ?6 Lat leg - Ankle 14  L Median - Orthodromic (Dig II, Mid palm)     Dig II Wrist 3.0 ?3.4 18 ?10 Dig II - Wrist 13  L Ulnar - Orthodromic, (Dig V, Mid palm)     Dig V Wrist 2.7 ?3.1 5 ?5 Dig V - Wrist 39               F  Wave    Nerve F Lat Ref.   ms ms  L Ulnar - ADM 27.1 ?32.0  L Tibial - AH 51.8 ?56.0         EMG full       EMG Summary Table    Spontaneous MUAP Recruitment  Muscle IA Fib PSW Fasc Other Amp Dur. Poly Pattern  L. Cervical paraspinals Normal 1+ None Occasional _______ Normal Normal 2+ Normal  L. Trapezius Normal 1+ 1+ Occasional  _______ Normal Normal 2+ Reduced  L. Deltoid Normal None None None _______ Normal Normal Normal Normal  L. Biceps brachii Normal None None None _______ Normal Normal Normal Normal  L. Triceps brachii Normal None None None _______ Normal Normal Normal Normal  L. Flexor carpi radialis Normal None None None _______ Normal Normal Normal Normal  L. First dorsal interosseous Normal None None None _______ Normal Normal Normal Normal

## 2017-03-29 ENCOUNTER — Telehealth: Payer: Self-pay | Admitting: Adult Health

## 2017-03-29 NOTE — Telephone Encounter (Signed)
It's my understanding that Dr. Leta Baptist reviewed this with this patient.

## 2017-03-29 NOTE — Telephone Encounter (Signed)
We could increase gabapentin or topamax to see if that helps? I will forward to Dr. Leta Baptist for his recommendations as well.

## 2017-03-29 NOTE — Telephone Encounter (Signed)
I spoke to pt.  He is having progressively worsening sx the last 7-10 days of L arm weaker, from his left side eye, face, behind ear, neck to arm then left hand tremor like (constant movement). He states that sometimes will get freezing in face, neck, head popping.  He states he places his hand in pocket as not to let it hang due to weakness.  Taking gabapentin 2 caps in AM, topamax 50mg  po qhs.  Does not feel like the medications are working.  No flexeril as not helped.  Please advise.

## 2017-03-29 NOTE — Telephone Encounter (Signed)
I spoke to pt and let him know below, and will have to call him back about increase of medication recommendations.  I emailed to him below per his request.

## 2017-03-29 NOTE — Telephone Encounter (Signed)
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   Severe numbness, pain, spasms can be slightly reduced with medications. Gabapentin or topiramate can be increased to help with nerve pains and tremors.    Penni Bombard, MD 1/47/8295, 6:21 PM Certified in Neurology, Neurophysiology and Neuroimaging  [facility] [address], [place], [place] [address] [phone]

## 2017-03-29 NOTE — Telephone Encounter (Signed)
Patient called office in reference to having nerves popping around in his neck causing headaches, left arm getting weaker, head is kind of moving around.  Worsening symptoms have been present for 10 days.  Please call

## 2017-03-30 NOTE — Telephone Encounter (Signed)
I spoke with Jinny Blossom, NP.  Pt to try taking the gabapentin BID as ordered along with the topamax.  If still having sx or no improvement to let us know. I attempted to notify pt via phone and VM not set up.

## 2017-04-02 MED ORDER — TOPIRAMATE 25 MG PO TABS: 75.0000 mg | ORAL_TABLET | Freq: Every day | ORAL | 3 refills | Status: DC

## 2017-04-02 NOTE — Telephone Encounter (Signed)
Patient called office returning Megan's call.  Please call

## 2017-04-02 NOTE — Telephone Encounter (Signed)
I called the patient. He does report burning and tingling pain of the neck on the left side and down into the shoulder. He does feel that the left arm is weaker. He did complete 6 weeks of rehabilitation after radiation. I advised that he should continue trying to do the same exercises on that arm. Encouraged the patient not neglect using that arm. We will try increasing Topamax to 75 mg at bedtime. The patient reports gabapentin does cause drowsiness. If this is not beneficial he will give [place] a call back.

## 2017-04-02 NOTE — Telephone Encounter (Signed)
I called the patient. Was unable to leave a voicemail as this has not been set up on his phone. I will try again later.

## 2017-04-02 NOTE — Addendum Note (Signed)
Addended by: Trudie Buckler on: 04/02/2017 04:30 PM   Modules accepted: Orders

## 2017-04-02 NOTE — Telephone Encounter (Signed)
I spoke with pt and he has been taking the gabapentin and topamax as prescribed now for 4 days and no improvement.  He has pain, numbness, tingling, muscle spasms (fasiculations from neck to face).

## 2017-04-09 ENCOUNTER — Telehealth: Payer: Self-pay | Admitting: Adult Health

## 2017-04-09 ENCOUNTER — Other Ambulatory Visit (HOSPITAL_COMMUNITY): Payer: Self-pay | Admitting: Radiation Oncology

## 2017-04-09 ENCOUNTER — Telehealth: Payer: Self-pay | Admitting: *Deleted

## 2017-04-09 ENCOUNTER — Other Ambulatory Visit: Payer: Self-pay | Admitting: Radiation Oncology

## 2017-04-09 DIAGNOSIS — R1310 Dysphagia, unspecified: Secondary | ICD-10-CM

## 2017-04-09 DIAGNOSIS — C090 Malignant neoplasm of tonsillar fossa: Secondary | ICD-10-CM

## 2017-04-09 MED ORDER — PENTOXIFYLLINE ER 400 MG PO TBCR: EXTENDED_RELEASE_TABLET | ORAL | 5 refills | Status: DC

## 2017-04-09 MED ORDER — VITAMIN E 180 MG (400 UNIT) PO CAPS: ORAL_CAPSULE | ORAL | 5 refills | Status: DC

## 2017-04-09 NOTE — Telephone Encounter (Signed)
Patient calling to follow up on an e mail he sent to you last week.that was to be shared with [facility] and Dr. Leta Baptist. He needs to know if you received the e mail.

## 2017-04-09 NOTE — Telephone Encounter (Signed)
Noted and sent to MM/NP this am.   I emailed pt back and let him know received and forwarded.

## 2017-04-09 NOTE — Telephone Encounter (Signed)
Called patient to inform of MBS on 04-17-17 - arrival time - 10:45 am, no restrictions to test, test to begin @ 11:30 am @ [facility], spoke with patient and he is aware of this appt.

## 2017-04-09 NOTE — Progress Notes (Signed)
Spoke with patient (returning his message) by phone about progressive neurologic symptoms. Reviewed neurology notes.  Will try medications and referral as below - these meds and hyperbaric O2 may possibly help if his nerve injury is from radiotherapy.   -----------------------------------  Eppie Gibson, MD     ICD-10-CM   1. Primary cancer of tonsillar fossa (HCC) C09.0 vitamin E 400 UNIT capsule    pentoxifylline (TRENTAL) 400 MG CR tablet    AMB referral to wound care center

## 2017-04-10 ENCOUNTER — Telehealth: Payer: Self-pay | Admitting: *Deleted

## 2017-04-10 NOTE — Telephone Encounter (Signed)
CALLED PATIENT TO INFORM OF APPT. @ [facility] [facility] ON 04-24-17- ARRIVAL TIME - 7:45 AM , ADDRESS - [address], [address] FLO[address], SPOKE WITH PATIENT AND HE IS AWARE OF THIS APPT.

## 2017-04-11 ENCOUNTER — Encounter (HOSPITAL_BASED_OUTPATIENT_CLINIC_OR_DEPARTMENT_OTHER): Payer: Managed Care, Other (non HMO) | Attending: Surgery

## 2017-04-11 DIAGNOSIS — L598 Other specified disorders of the skin and subcutaneous tissue related to radiation: Secondary | ICD-10-CM | POA: Insufficient documentation

## 2017-04-11 DIAGNOSIS — Z923 Personal history of irradiation: Secondary | ICD-10-CM | POA: Diagnosis not present

## 2017-04-11 DIAGNOSIS — Z85819 Personal history of malignant neoplasm of unspecified site of lip, oral cavity, and pharynx: Secondary | ICD-10-CM | POA: Insufficient documentation

## 2017-04-11 DIAGNOSIS — Z79899 Other long term (current) drug therapy: Secondary | ICD-10-CM | POA: Diagnosis not present

## 2017-04-12 ENCOUNTER — Other Ambulatory Visit (HOSPITAL_COMMUNITY): Payer: Self-pay | Admitting: Surgery

## 2017-04-12 ENCOUNTER — Telehealth: Payer: Self-pay | Admitting: Diagnostic Neuroimaging

## 2017-04-12 DIAGNOSIS — Z9289 Personal history of other medical treatment: Secondary | ICD-10-CM

## 2017-04-12 NOTE — Telephone Encounter (Signed)
This is a mutual pt that Dr Christin Fudge would like to discuss with you, please call

## 2017-04-13 ENCOUNTER — Other Ambulatory Visit: Payer: Self-pay | Admitting: Surgery

## 2017-04-13 ENCOUNTER — Ambulatory Visit (HOSPITAL_COMMUNITY)
Admission: RE | Admit: 2017-04-13 | Discharge: 2017-04-13 | Disposition: A | Discharge status: Home / Self Care | Payer: Managed Care, Other (non HMO) | Source: Ambulatory Visit | Attending: Surgery | Admitting: Surgery

## 2017-04-13 DIAGNOSIS — Z9889 Other specified postprocedural states: Secondary | ICD-10-CM | POA: Diagnosis present

## 2017-04-13 DIAGNOSIS — C090 Malignant neoplasm of tonsillar fossa: Secondary | ICD-10-CM | POA: Diagnosis not present

## 2017-04-13 DIAGNOSIS — Z008 Encounter for other general examination: Secondary | ICD-10-CM | POA: Diagnosis present

## 2017-04-13 NOTE — Telephone Encounter (Signed)
I reviewed patient's case and discussed with Dr. Con Memos.  In summary patient received radiation therapy in August 2017 for left tonsil squamous cell carcinoma. 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  Patient has tried and failed topiramate, gabapentin, over-the-counter medications. Patient having significant pain, discomfort and disability due to these symptoms.  Therefore I think it is a reasonable option for patient to try hyperbaric oxygen therapy which may promote recovery and alleviate symptoms. Patient has been referred to Dr. Con Memos for consideration of this type of treatment.   Penni Bombard, MD 11/03/8675, 3:73 AM Certified in Neurology, Neurophysiology and Neuroimaging  [facility] [address], [place], [place] [address] [phone]

## 2017-04-14 ENCOUNTER — Ambulatory Visit (HOSPITAL_COMMUNITY)
Admission: RE | Admit: 2017-04-14 | Discharge: 2017-04-14 | Disposition: A | Discharge status: Home / Self Care | Payer: Managed Care, Other (non HMO) | Source: Ambulatory Visit | Attending: Family Medicine | Admitting: Family Medicine

## 2017-04-17 ENCOUNTER — Other Ambulatory Visit: Payer: Self-pay | Admitting: Gastroenterology

## 2017-04-17 ENCOUNTER — Encounter (HOSPITAL_BASED_OUTPATIENT_CLINIC_OR_DEPARTMENT_OTHER): Payer: Managed Care, Other (non HMO) | Attending: Surgery

## 2017-04-17 ENCOUNTER — Encounter: Payer: Self-pay | Admitting: Radiation Oncology

## 2017-04-17 ENCOUNTER — Ambulatory Visit (HOSPITAL_COMMUNITY)
Admission: RE | Admit: 2017-04-17 | Discharge: 2017-04-17 | Disposition: A | Discharge status: Home / Self Care | Payer: Managed Care, Other (non HMO) | Source: Ambulatory Visit | Attending: Radiation Oncology | Admitting: Radiation Oncology

## 2017-04-17 DIAGNOSIS — R1310 Dysphagia, unspecified: Secondary | ICD-10-CM | POA: Diagnosis present

## 2017-04-17 DIAGNOSIS — Z8589 Personal history of malignant neoplasm of other organs and systems: Secondary | ICD-10-CM | POA: Insufficient documentation

## 2017-04-17 DIAGNOSIS — I341 Nonrheumatic mitral (valve) prolapse: Secondary | ICD-10-CM | POA: Diagnosis not present

## 2017-04-17 DIAGNOSIS — G0489 Other myelitis: Secondary | ICD-10-CM | POA: Diagnosis not present

## 2017-04-17 DIAGNOSIS — K219 Gastro-esophageal reflux disease without esophagitis: Secondary | ICD-10-CM | POA: Diagnosis not present

## 2017-04-17 DIAGNOSIS — Y842 Radiological procedure and radiotherapy as the cause of abnormal reaction of the patient, or of later complication, without mention of misadventure at the time of the procedure: Secondary | ICD-10-CM | POA: Diagnosis not present

## 2017-04-17 DIAGNOSIS — C090 Malignant neoplasm of tonsillar fossa: Secondary | ICD-10-CM | POA: Insufficient documentation

## 2017-04-17 DIAGNOSIS — L598 Other specified disorders of the skin and subcutaneous tissue related to radiation: Secondary | ICD-10-CM | POA: Insufficient documentation

## 2017-04-17 NOTE — Progress Notes (Signed)
Modified Barium Swallow Progress Note  Patient Details  Name: [name] MRN: [id number] Date of Birth: 12/11/1975  Today's Date: 04/17/2017  Modified Barium Swallow completed.  Full report located under Chart Review in the Imaging Section.  Brief recommendations include the following:  Clinical Impression  Pt exhibits min-mild pharyngeal dysphagia marked by mildly decreased laryngeal elevation and epiglottic deflection leading to moderate vallecular residue following solid textures. Extra swallow and subsequent sips thin barium assisted transit of vallecular residue into the esophagus. No laryngeal vestibule penetration or aspiration present. Sensation intact with adeqauate initiation of swallow. MBS does not make diagnoses below the level of the UES however esophagus is scanned revealing barium pill stopped mid esophagus with unsuccessful attempt at transit with sips thin barium alone. Puree trial effective to push pill to gastroesophageal junction. Recommend continue regular texture using caution with meats and breads (soft meats) and thin liquids. Educated pt to continue alternating liquids and solids and esophageal precautions (stay up 45 min after meals, smaller more frequent meals if needed, solids following pills. Recommended that he continue home swallowing exercise program.       Swallow Evaluation Recommendations       SLP Diet Recommendations: Regular solids;Thin liquid   Liquid Administration via: Straw;Cup   Medication Administration: Whole meds with liquid   Supervision: Patient able to self feed   Compensations: Slow rate;Small sips/bites   Postural Changes: Seated upright at 90 degrees;Remain semi-upright after after feeds/meals (Comment)   Oral Care Recommendations: Oral care BID        [name] 04/17/2017,2:14 PM  [name] [employer] [phone]

## 2017-04-18 NOTE — Progress Notes (Addendum)
I spoke with Dr. Christin Fudge today about Mr. [name].  I informed him that Mr. [name]'s neurologic symptoms may in part be due to scarring/fibrosis/nerve injury from the radiation.  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  His dominant symptoms correlate to nerves/muscles on the Left side of his neck - this is where the highest dose of 70Gy in 35 fractions was directed at a large nodal mass to cure his cancer.  I do think that hyperbaric oxygen is the best available option to ameliorate his symptoms.  Reoxygenation may facilitate tissue repair.  I don't want merely attempt symptom suppression with medications, this young man is miserable and I want to give him a chance to reverse the injury he has sustained.    -----------------------------------  Eppie Gibson, MD

## 2017-05-11 ENCOUNTER — Telehealth: Payer: Self-pay | Admitting: Radiation Oncology

## 2017-05-11 NOTE — Telephone Encounter (Signed)
I spoke with Dr. Phoebe Sharps by phone today as he saw [name] and his wife yesterday for a second opinion.  Dr. Phoebe Sharps reviewed [name]'s plan and conveyed that he did not see anything abnormal that would have predicted the patient's unfortunate post-treatment course/neurologic symptoms. He reiterated to the patient the potential value of recommended therapies that I had encouraged [name] to try previously.  Altough [name] had discontinued Trental with Vit E due to GI upset, Dr. Phoebe Sharps urged him to restart this regimen.  Dr. Phoebe Sharps also urged him to attend Hyperbaric Oxygen treatments as these can be helpful for patients like him improve/restore neurologic function and QOL.  I called [name] today to see if he had any questions about the appointment yesterday - his VM was not set up; I did leave a message on his wife's phone to encourage either of them to call with questions or needs that I may help them with.  I do have an appointment with him next week - Aug 3.  Depending on [name] interest in additional consultations / opinions, we can discuss other options that Dr. Phoebe Sharps suggested, such as:  1) genetic counseling to determine if he has a rare genetic mutation, such as a germline ATM mutation, that would cause radiosensitivity to be heightened.  2) a second opinion in neurology +/- extensive EMG studies  -----------------------------------  Eppie Gibson, MD

## 2017-05-12 ENCOUNTER — Telehealth: Payer: Self-pay | Admitting: Radiation Oncology

## 2017-05-14 ENCOUNTER — Telehealth: Payer: Self-pay | Admitting: Radiation Oncology

## 2017-05-14 NOTE — Telephone Encounter (Signed)
This AM I was able to reach Mr. [name] by phone.  He reports he called to arrange Hyperbaric Oxygen in [facility] after his appointment with Dr. Phoebe Sharps.  He had been taking Trental and Vit E only once daily but is now taking Trental TID and Vit E BID.   He's noted some dizziness with the Trental but is not sure if that is a causal effect.  I let Mr. [name] know that last week I spoke with Dr. Cecil Cobbs about his case and Dr. Mickeal Skinner, a new neurologist in our cancer center, offered to meet him this Aug 3rd during our followup.  Mr. [name] appreciated this.  I told Mr. [name] that Dr. Phoebe Sharps suggested a second opinion from a neurologist and I think this is warranted given his struggles and the complexity of his condition (for example, he has symptoms that are related to both central and peripheral nervous system dysfunction). I also mentioned to Mr. [name] that genetic testing can be considered (ie for ATM mutations).  I told Mr. [name] that if our appt conflicts with his hyperbaric oxygen appts, to please let me know so we can work around that.  The HBO should take top priority.  He expressed appreciation for the call.  I encouraged him to bring his wife if she is free, as I am also concerned about her well being.  I know Mr. [name] struggles have been difficult for them both and I want to do everything in my power to support both of them.  -----------------------------------  Eppie Gibson, MD

## 2017-05-14 NOTE — Progress Notes (Signed)
[name] presents for follow up of radiation completed 07/05/16 to his Tonsils and bilateral neck.   Pain issues, if any: He reports soreness to his neck, back, head. He tells me his pain in tolerable Using a feeding tube?: N/A Weight changes, if any:  Wt Readings from Last 3 Encounters:  05/18/17 146 lb (66.2 kg)  03/06/17 149 lb 9.6 oz (67.9 kg)  02/14/17 147 lb (66.7 kg)   Swallowing issues, if any: He tells me food will get stuck in his throat. He drinks a lot of water to help food go down easier. He is unable to eat drier food such as bread, crackers.  Smoking or chewing tobacco? No Using fluoride trays daily? He is using daily.  Last ENT visit was on:  Other notable issues, if any:  Dr. Phoebe Sharps (radiation oncology) 05/10/17 [facility] for hyperbaric oxygen treatment yesterday. He tells me that his Left ear would not clear. He was sent to an ENT that day for tubes. He was told her was unable to have tubes because of previous radiation. He was given flonase and afrin to attempt to open his ear. He has an appointment to attempt on Monday.   BP 114/74   Pulse 78   Temp 97.7 F (36.5 C)   Ht 5\' 11"  (1.803 m)   Wt 146 lb (66.2 kg)   SpO2 100% Comment: room air  BMI 20.36 kg/m

## 2017-05-17 ENCOUNTER — Encounter: Payer: Managed Care, Other (non HMO) | Attending: Surgery | Admitting: Surgery

## 2017-05-17 DIAGNOSIS — G0489 Other myelitis: Secondary | ICD-10-CM | POA: Insufficient documentation

## 2017-05-17 DIAGNOSIS — L598 Other specified disorders of the skin and subcutaneous tissue related to radiation: Secondary | ICD-10-CM | POA: Insufficient documentation

## 2017-05-18 ENCOUNTER — Ambulatory Visit
Admission: RE | Admit: 2017-05-18 | Discharge: 2017-05-18 | Disposition: A | Discharge status: Home / Self Care | Payer: Managed Care, Other (non HMO) | Source: Ambulatory Visit | Attending: Radiation Oncology | Admitting: Radiation Oncology

## 2017-05-18 ENCOUNTER — Encounter: Payer: Self-pay | Admitting: Surgery

## 2017-05-18 ENCOUNTER — Encounter: Payer: Self-pay | Admitting: Radiation Oncology

## 2017-05-18 VITALS — BP 114/74 | HR 78 | Temp 97.7°F | Ht 71.0 in | Wt 146.0 lb

## 2017-05-18 DIAGNOSIS — R29818 Other symptoms and signs involving the nervous system: Secondary | ICD-10-CM | POA: Insufficient documentation

## 2017-05-18 DIAGNOSIS — R4189 Other symptoms and signs involving cognitive functions and awareness: Secondary | ICD-10-CM

## 2017-05-18 DIAGNOSIS — C090 Malignant neoplasm of tonsillar fossa: Secondary | ICD-10-CM

## 2017-05-18 DIAGNOSIS — R221 Localized swelling, mass and lump, neck: Secondary | ICD-10-CM | POA: Insufficient documentation

## 2017-05-18 NOTE — Progress Notes (Signed)
Radiation Oncology         [phone] ________________________________  Name: [name] MRN: [id number]  Date: 05/18/2017  DOB: 06/29/1976  Follow-Up Visit  Note  CC: Alroy Dust, L.Marlou Sa, MD  Jerrell Belfast, MD  Diagnosis and Prior Radiotherapy:     ICD-10-CM   1. Cognitive changes R41.89 TSH    T4, free    UPEP/TP, 24-Hr Urine    Comprehensive metabolic panel    CBC with Differential    CANCELED: Protein electrophoresis, serum  2. 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  Narrative:  The patient returns today for routine follow-up of definitive radiation completed to his tonsils and bilateral neck on 07/05/16.        Please see previous note following my phone call with Dr. Phoebe Sharps. Patient had not yet started hyperbaric O2 and was taking pentoxyfylline and Vit E at half doses compared to that prescribed by me. Attributed GI upset to these meds. Dr. Phoebe Sharps recommended lifelong pentoxifylline and Vit E at BID to TID, and proceeding with Hyperbaric O2 for his neurologic symptoms to have the best chance of recovery.  Dr. Mickeal Skinner, a new neurologist at our cancer center, is joining [place] today at my request and at the permission of the patient, as Dr. Phoebe Sharps suggested we obtain a fresh point of view from another neurologist given the complexity of his symptoms.    Patient attempted hyperbaric O2 following his visit with Dr. Phoebe Sharps.  Had issues with ear pressure.  Saw an ENT at [facility] with name starting with a V, recommended  decongestants, and is not sure if tubes in his tympanic membranes is a possibility given his prior RT to the HN region.  Patient reported that he feels worse. He is having pain in right lower back/ the right flank area. Fatigue is worse, headache to left side from behind ear and front head. Patient also has twitching of SCM muscle on the left. Twitch noticed on the left pectoralis to the back muscle on the left side.   Patient reported the continuation of tingling sensation in legs, occasional decreased strength/dropping objects from left upper extremity, and cognitive issues. Has been ongoing since March.  Still forgets whole conversations and emails he wrote at work  Patient has reported having a couple issues while going to the bathroom - lost bowel continence twice in the past month. Occasionally feels lightheaded when standing up, he  says he has to slowly get up.  Reports erectile dysfunction. Concerned about losing his job due to decreased memory and frequent HBO appts.   ALLERGIES:  has No Known Allergies.  Meds: Current Outpatient Prescriptions  Medication Sig Dispense Refill  . cyclobenzaprine (FLEXERIL) 5 MG tablet Take 1 tablet (5 mg total) by mouth 3 (three) times daily. Muscle Relaxant. (Patient taking differently: Take 5 mg by mouth 3 times/day as needed-between meals & bedtime. Muscle Relaxant.) 63 tablet 0  . gabapentin (NEURONTIN) 300 MG capsule Take 1 capsule (300 mg total) by mouth 2 (  two) times daily. (Patient taking differently: Take 600 mg by mouth 2 (two) times daily. ) 60 capsule 6  . pentoxifylline (TRENTAL) 400 MG CR tablet Take 433m tab daily x 1 week, then 4057mBID; take with food 60 tablet 5  . ranitidine (ZANTAC) 150 MG capsule Take 150 mg by mouth as needed for heartburn.    . sodium fluoride (FLUORISHIELD) 1.1 % GEL dental gel Instill one drop of gel per tooth space of fluoride tray. Place over teeth for 5 minutes. Remove. Spit out excess. Repeat nightly. 120 mL  prn  . topiramate (TOPAMAX) 25 MG tablet Take 3 tablets (75 mg total) by mouth at bedtime. 90 tablet 3  . vitamin E 400 UNIT capsule Take 400 IU daily x 1 week, then 400 IU BID 60 capsule 5   No current facility-administered medications for this encounter.     Physical Findings: The patient is in no acute distress. Patient is alert and oriented. Wt Readings from Last 3 Encounters:  05/18/17 146 lb (66.2 kg)  03/06/17 149 lb 9.6 oz (67.9 kg)  02/14/17 147 lb (66.7 kg)    height is 5' 11"  (1.803 m) and weight is 146 lb (66.2 kg). His temperature is 97.7 F (36.5 C). His blood pressure is 114/74 and his pulse is 78. His oxygen saturation is 100%. .   BP (supine position) : 119/75 with pulse of 56  General: Alert and oriented, in no acute distress HEENT: Head is normocephalic. Extraocular movements are intact. Oropharynx is clear, tongue is midline, oral cavity is clear. Mobility in tongue is normal.  Neck: Neck is notable for Level 2 region of the neck there is a palpable mass were he was treated about 2cm in dimension. No other palpable mass in the cervical regions. Skin: Skin in treatment fields shows satisfactory healing. Heart: Regular in rate and rhythm with no murmurs, rubs, or gallops. Chest: Clear to auscultation bilaterally, with no rhonchi, wheezes, or rales. Abdomen: Soft, nontender, nondistended, with no rigidity or guarding. Extremities: No cyanosis or edema. Psychiatric: Judgment and insight are intact. Affect is appropriate. Neurologic exam Visble fasiculations in the left SCM    1+ reflex in the left brachioradialis  2+ everywhere else in the extremities per exam by Dr VaMickeal SkinnerLeft eyebrow is more raised than right. Finger to nose testing is intact Please see future notes from Dr. VaMickeal Skinneror detailed neurologic exams    Lab Findings: Lab Results  Component Value Date   WBC 4.7 08/21/2016   HGB 12.8 (L) 08/21/2016   HCT 36.8 (L) 08/21/2016   MCV 86.6 08/21/2016     PLT 207 08/21/2016    Lab Results  Component Value Date   TSH 1.451 10/11/2016    Radiographic Findings: No results found.  Impression/Plan:    1) Head and Neck Cancer Status: Needs CT neck to verify that left neck mass is scar tissue and not progressive disease.  Dealing with symptoms of the central and peripheral nervous system.  I reviewed his RT plan and have asked physics to perform more QA. From my review, the dose to the brainstem and spinal cord were within the limits of departmental guidelines.  However, patients can rarely can have neuropathy/myelopathy following RT even when it's given within such guidelines.  Patient may be unusually sensitive to RT. Requested physics review/QA.  [facility] can be done here as patient says this would  be easier for him.  I'll see him after the studies are complete to discuss results. He states he will cancel [facility] imaging if we can get studies here.  I will also review dose to his tympanic membranes, in case tympanostomy tube are needed to tolerate hyperbaric O2.  2) Nutritional Status: No feeding tube, stable  3) Dental: Encouraged to continue regular followup with dentistry, and dental hygiene including fluoride rinses. He is not doing this regularly but will try, he states.  4) Thyroid function: recheck today Lab Results  Component Value Date   TSH 1.451 10/11/2016    5) A panel of tests will be ordered at the request of Dr. Mickeal Skinner. Will set up an appointment to go over scans and testing as above with me.  Orders Placed This Encounter  Procedures  . TSH  . T4, free  . UPEP/TP, 24-Hr Urine  . Comprehensive metabolic panel  . CBC with Differential     6) Follow-up in 2 months with Dr Mickeal Skinner to evaluate further.  Dr Mickeal Skinner  is still obtaining credentialing with insurance companies.  Continue following with Dr Lonni Fix as well for medication management and symptom  management at this time.  Dr Mickeal Skinner has reviewed his EMG reports and will consider retesting again in a couple months. The patient was encouraged to call with any issues or questions before seeing me in 2-3 weeks.    Regarding his issues at work and concern about maintaining employment I offered to write another letter to advocate for him.  He declines at this time.  It is heartbreaking to see Mr. Dorner struggling like this.  He is a wonderful person, and fought his cancer so courageously through a difficult course of radiotherapy.  To see him now coping with multiple neurologic issues, the stress of holding down a job, the uncertainty of how his symptoms will evolve or what is causing all of them .Marland KitchenMarland KitchenMarland KitchenI can tell this is devastating him emotionally. He has received counseling from social work and he knows that I'm happy to refer him for further counseling if he chooses.  I gave him emotional support today and let him know that I am here for him and his wife and I will continue to advocate for him in any way that I can.  He understands after speaking with Dr. Mickeal Skinner that not all of his symptoms can be explained by the radiotherapy, such as his memory issues or his loss of bowel function, or erectile dysfunction.  The back pain started before completing radiotherapy and is also hard to explain.  Spinal accessory neuropathy is extremely rare and while neither Dr Mauri Reading or I have previously seen this following RT, it's conceivable after neck radiotherapy, especially if patient is exquisitely sensitive to RT.  I told Mr. Russi that we may consider genetic testing, ie for an ATM mutation, but at this time he is overwhelmed by appts.  Will prioritize his Hyperbaric O2, visits with me, testing, and neuro visits for now.  His wife could not come today as she is out of town. We encouraged him to bring her to future appointments in the [facility] if she is able.  I also told him we will work around his hyperbaric O2 which  is critical  He knows that taking the trental and vit E is also critical.  Mr. Shanks expressed gratitude for our support today.  I spent 60 minutes minutes face to face with the patient and more than 50% of that  time was spent in counseling and/or coordination of care. _____________________________________   Eppie Gibson, MD  This document serves as a record of services personally performed by Eppie Gibson MD. It was created on her behalf by Delton Coombes, a trained medical scribe. The creation of this record is based on the scribe's personal observations and the provider's statements to them. This document has been checked and approved by the attending provider.     with

## 2017-05-18 NOTE — Progress Notes (Addendum)
[name], [name] ([id number]) Visit Report for 05/17/2017 Arrival Information Details Patient Name: [name], [name] Date of Service: 05/17/2017 8:00 AM Medical Record Number: [id number] Patient Account Number: [id number] Date of Birth/Sex: 1976-02-28 (41 y.o. Male) Treating RN: Primary Care Kharee Lesesne: Donavan Burnet Other Clinician: Jacqulyn Bath Referring Chantele Corado: Donavan Burnet Treating Kweku Stankey/Extender: Frann Rider in Treatment: 0 Visit Information Patient Arrived: Ambulatory Arrival Time: 07:50 Accompanied By: self Transfer Assistance: None Patient Identification Verified: Yes Secondary Verification Process Yes Completed: Electronic Signature(s) Signed: 05/21/2017 9:15:58 AM By: Christin Fudge MD, FACS Previous Signature: 05/17/2017 4:18:55 PM Version By: Lorine Bears RCP, RRT, CHT Entered By: Christin Fudge on 05/21/2017 09:15:57 [name] ([id number]) -------------------------------------------------------------------------------- General Visit Notes Details Patient Name: [name] Date of Service: 05/17/2017 8:00 AM Medical Record Number: [id number] Patient Account Number: [id number] Date of Birth/Sex: 03-27-1976 (41 y.o. Male) Treating RN: Primary Care Hudsen Fei: Donavan Burnet Other Clinician: Jacqulyn Bath Referring Madilynne Mullan: Donavan Burnet Treating Tuvia Woodrick/Extender: Frann Rider in Treatment: 0 Notes This 41 year old gentleman has been seen by me in great detail on 2 separate occasions in the [facility] wound care center, and under my care, after much deliberation and various consultations, including talking to his neurologist and his radiation oncologist, I was able to get his case reviewed by his insurance company and get him authorized for hyperbaric oxygen therapy. His preoperative workup was within normal limits. Due to logistic issues, he wanted to have his hyperbaric oxygen therapy at [facility] and has been seen here this  morning. Prior to him getting into the chamber I did a thorough review and examined him clinically. His heart and lungs were within normal limits and his ENT examination showed bilateral clear tympanic membranes. The patient started his hyperbaric oxygen therapy with the usual precautions and methodology. Details of this are noted in the hyperbaric record. the patient was started on compression to pressure set for 2 atm and at a rate set of 1.5 psi per minute. The patient complained of left ear pain at one point to 4 atm and pressure was decreased to 1.18 atm and the patient's years subsided. Multiple attempts were made to clear his left ear but he was not able to do so. At this stage rate set was decreased to 1 psi per minute and pressure was increased to 1.21 atm the patient once again began to feel pain in his left ear. At this stage pressure was decreased appropriately and the patient was removed from the chamber. Total time the chamber was 12 minutes. The patient was able to be gently taken up to the appropriate pressure but within 12 minutes of him being in the chamber he started complaining of significant pain and discomfort in his left ear. He was gradually brought out of the chamber in the appropriate fashion and my examination revealed that the right tympanic membrane was normal but the left tympanic membrane did show mild erythema and a Teed grade 1 tympanic membrane. I recommended he discontinue hyperbaric oxygen therapy for today and have referred him to ENT for an appropriate placement of Meyringotomy tubes. He was agreeable with the plan and will be back to see as once his ENT issues have been sorted out Electronic Signature(s) Signed: 05/17/2017 4:04:23 PM By: Christin Fudge MD, FACS Previous Signature: 05/17/2017 4:04:13 PM Version By: Christin Fudge MD, FACS Previous Signature: 05/17/2017 3:48:35 PM Version By: Christin Fudge MD, FACS Entered By: Christin Fudge on 05/17/2017  16:04:23 [name] ([id number]) -------------------------------------------------------------------------------- Vitals Details Patient Name: [name] Date of  Service: 05/17/2017 8:00 AM Medical Record Number: [id number] Patient Account Number: [id number] Date of Birth/Sex: [birth year] (41 y.o. Male) Treating RN: Primary Care Janisha Bueso: Donavan Burnet Other Clinician: Jacqulyn Bath Referring Stormie Ventola: Donavan Burnet Treating Sharian Delia/Extender: Frann Rider in Treatment: 0 Vital Signs Time Taken: 08:15 Temperature (F): 97.7 Pulse (bpm): 72 Respiratory Rate (breaths/min): 16 Blood Pressure (mmHg): 98/66 Reference Range: 80 - 120 mg / dl Notes HBO Notes: HBO chamber started compression to pressure set of 2.0 ATA at 0823 am at a rate set of 1.5 psi/min. Patient complained of left ear pain at 1.24 ATA and pressure was decreased to 1.18 ATA and patientos ear pain subsided. The patient made multiple attempts to clear his left ear but was not able to do so. Rate set was decreased to 1.0 psi/min and pressure was increased to 1.21 ATA and patient once again began to feel pain in his left ear. Pressure set was decreased to 1.10 ATA and patient was removed from the HBO chamber at 08:35 am. Total time in the chamber was 12 minutes. Dr. Con Memos was called to the HBO room to examine the patientos ears. Electronic Signature(s) Signed: 05/21/2017 9:16:09 AM By: Christin Fudge MD, FACS Previous Signature: 05/17/2017 4:18:55 PM Version By: Lorine Bears RCP, RRT, CHT Entered By: Christin Fudge on 05/21/2017 09:16:09

## 2017-05-18 NOTE — Progress Notes (Signed)
TYQUAVIOUS, GAMEL ([id number]) Visit Report for 05/17/2017 Physician Orders Details Patient Name: [name], [name] Date of Service: 05/17/2017 8:00 AM Medical Record Number: [id number] Patient Account Number: [id number] Date of Birth/Sex: 05-12-1976 (41 y.o. Male) Treating RN: Cornell Barman Primary Care Provider: Donavan Burnet Other Clinician: Jacqulyn Bath Referring Provider: Donavan Burnet Treating Provider/Extender: Frann Rider in Treatment: 0 Verbal / Phone Orders: No Diagnosis Coding Hyperbaric Oxygen Therapy o Indication: - STRN of left shoulder o If appropriate for treatment, begin HBOT per protocol: o 2.0 ATA for 90 Minutes without Air Breaks o One treatment per day (delivered Monday through Friday unless otherwise specified in Special Instructions below): o Total # of Treatments: - 40 Electronic Signature(s) Signed: 05/17/2017 10:32:45 AM By: Gretta Cool, BSN, RN, CWS, Kim RN, BSN Signed: 05/17/2017 4:24:31 PM By: Christin Fudge MD, FACS Entered By: Gretta Cool, BSN, RN, CWS, Kim on 05/17/2017 10:32:45 [name] ([id number]) -------------------------------------------------------------------------------- Problem List Details Patient Name: [name] Date of Service: 05/17/2017 8:00 AM Medical Record Number: [id number] Patient Account Number: [id number] Date of Birth/Sex: 28-Jul-1976 (41 y.o. 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

## 2017-05-19 ENCOUNTER — Other Ambulatory Visit: Payer: Self-pay | Admitting: Radiation Oncology

## 2017-05-19 DIAGNOSIS — R4189 Other symptoms and signs involving cognitive functions and awareness: Secondary | ICD-10-CM | POA: Insufficient documentation

## 2017-05-19 DIAGNOSIS — G540 Brachial plexus disorders: Secondary | ICD-10-CM | POA: Insufficient documentation

## 2017-05-19 DIAGNOSIS — R29818 Other symptoms and signs involving the nervous system: Secondary | ICD-10-CM | POA: Insufficient documentation

## 2017-05-19 DIAGNOSIS — W888XXA Exposure to other ionizing radiation, initial encounter: Secondary | ICD-10-CM

## 2017-05-19 DIAGNOSIS — C090 Malignant neoplasm of tonsillar fossa: Secondary | ICD-10-CM

## 2017-05-19 DIAGNOSIS — R221 Localized swelling, mass and lump, neck: Secondary | ICD-10-CM

## 2017-05-21 ENCOUNTER — Encounter: Payer: Managed Care, Other (non HMO) | Admitting: Physician Assistant

## 2017-05-21 ENCOUNTER — Other Ambulatory Visit: Payer: Self-pay

## 2017-05-21 ENCOUNTER — Encounter: Payer: Self-pay | Admitting: Radiation Oncology

## 2017-05-21 DIAGNOSIS — G0489 Other myelitis: Secondary | ICD-10-CM | POA: Diagnosis not present

## 2017-05-21 DIAGNOSIS — C090 Malignant neoplasm of tonsillar fossa: Secondary | ICD-10-CM

## 2017-05-21 DIAGNOSIS — L598 Other specified disorders of the skin and subcutaneous tissue related to radiation: Secondary | ICD-10-CM | POA: Diagnosis present

## 2017-05-21 NOTE — Progress Notes (Addendum)
JAMAS, JAQUAY ([id number]) Visit Report for 05/17/2017 HBO Risk Assessment Details Patient Name: AH, BOTT Date of Service: 05/17/2017 8:00 AM Medical Record Number: [id number] Patient Account Number: [id number] Date of Birth/Sex: 1976/08/15 (41 y.o. Male) Treating RN: Primary Care Rogenia Werntz: Donavan Burnet Other Clinician: Jacqulyn Bath Referring Rayner Erman: Donavan Burnet Treating Malaquias Lenker/Extender: Frann Rider in Treatment: 0 HBO Risk Assessment Items Answer Electronic Signature(s) Signed: 05/17/2017 4:18:55 PM By: Lorine Bears RCP, RRT, CHT Previous Signature: 05/17/2017 2:09:18 PM Version By: Christin Fudge MD, FACS Entered By: Lorine Bears on 05/17/2017 14:38:59 [name] ([id number]) -------------------------------------------------------------------------------- HBO Safety Checklist Details Patient Name: [name] Date of Service: 05/17/2017 8:00 AM Medical Record Number: [id number] Patient Account Number: [id number] Date of Birth/Sex: 1976/10/16 (41 y.o. Male) Treating RN: Primary Care Kersti Scavone: Donavan Burnet Other Clinician: Jacqulyn Bath Referring Leiby Pigeon: Donavan Burnet Treating Mia Milan/Extender: Frann Rider in Treatment: 0 HBO Safety Checklist Items Safety Checklist Consent Form Signed Patient voided / foley secured and emptied When did you last eato 05/17/17 am Last dose of injectable or oral agent n/a NA Ostomy pouch emptied and vented if applicable NA All implantable devices assessed, documented and approved NA Intravenous access site secured and place Valuables secured Linens and cotton and cotton/polyester blend (less than 51% polyester) Personal oil-based products / skin lotions / body lotions removed NA Wigs or hairpieces removed NA Smoking or tobacco materials removed Books / newspapers / magazines / loose paper removed Cologne, aftershave, perfume and deodorant removed Jewelry removed (may wrap  wedding band) NA Make-up removed Hair care products removed Battery operated devices (external) removed Heating patches and chemical warmers removed NA Titanium eyewear removed NA Nail polish cured greater than 10 hours NA Casting material cured greater than 10 hours NA Hearing aids removed NA Loose dentures or partials removed NA Prosthetics have been removed Patient demonstrates correct use of air break device (if applicable) Patient concerns have been addressed Patient grounding bracelet on and cord attached to chamber Specifics for Inpatients (complete in addition to above) Medication sheet sent with patient Intravenous medications needed or due during therapy sent with patient ADRIANN, BALLWEG ([id number]) Drainage tubes (e.g. nasogastric tube or chest tube secured and vented) Endotracheal or Tracheotomy tube secured Cuff deflated of air and inflated with saline Airway suctioned Electronic Signature(s) Signed: 05/21/2017 9:16:15 AM By: Christin Fudge MD, FACS Previous Signature: 05/17/2017 2:09:04 PM Version By: Christin Fudge MD, FACS Entered By: Christin Fudge on 05/21/2017 09:16:15

## 2017-05-21 NOTE — Progress Notes (Addendum)
GURKIRAT, BASHER ([id number]) Visit Report for 05/21/2017 Fall Risk Assessment Details Patient Name: [name], [name] Date of Service: 05/21/2017 8:15 AM Medical Record Number: [id number] Patient Account Number: [id number] Date of Birth/Sex: 18-Nov-1975 (41 y.o. Male) Treating RN: Cornell Barman Primary Care Raniya Golembeski: Donavan Burnet Other Clinician: Referring Jamiracle Avants: Donavan Burnet Treating Yarethzy Croak/Extender: Frann Rider in Treatment: 0 Fall Risk Assessment Items Have you had 2 or more falls in the last 12 monthso 0 No Have you had any fall that resulted in injury in the last 12 monthso 0 No FALL RISK ASSESSMENT: History of falling - immediate or within 3 months 0 No Secondary diagnosis 0 No Ambulatory aid None/bed rest/wheelchair/nurse 0 Yes Crutches/cane/walker 0 No Furniture 0 No IV Access/Saline Lock 0 No Gait/Training Normal/bed rest/immobile 0 Yes Weak 0 No Impaired 0 No Mental Status Oriented to own ability 0 Yes Electronic Signature(s) Signed: 05/21/2017 12:51:38 PM By: Gretta Cool, BSN, RN, CWS, Kim RN, BSN Previous Signature: 05/21/2017 10:04:07 AM Version By: Gretta Cool, BSN, RN, CWS, Kim RN, BSN Entered By: Gretta Cool, BSN, RN, CWS, Kim on 05/21/2017 12:51:37

## 2017-05-21 NOTE — Progress Notes (Signed)
I noticed this weekend on 05-19-17 that [name] labs had been cancelled.  Dr. Mickeal Skinner and I had instructed [name] to check back in at the lobby desk to have the labs done at the end of his appointment on 05-18-17 to work up his symptoms.  I asked [name] Asa, [name] to look into this.  Per [name], she called lab. They told her they cancelled the labs because when they called for [name] upstairs, he was not there. They had to cancel the labs because they had already printed his labels.   I asked [name] to reorder the labs and ask [name] to return for them to be done.   I would like to do everything we can to get closer to an explanation for his distressing symptoms.  -----------------------------------  Eppie Gibson, MD

## 2017-05-22 ENCOUNTER — Other Ambulatory Visit: Payer: Self-pay | Admitting: Radiation Oncology

## 2017-05-22 ENCOUNTER — Encounter: Payer: Managed Care, Other (non HMO) | Admitting: Physician Assistant

## 2017-05-22 DIAGNOSIS — G545 Neuralgic amyotrophy: Secondary | ICD-10-CM

## 2017-05-22 DIAGNOSIS — L598 Other specified disorders of the skin and subcutaneous tissue related to radiation: Secondary | ICD-10-CM | POA: Diagnosis not present

## 2017-05-22 DIAGNOSIS — C090 Malignant neoplasm of tonsillar fossa: Secondary | ICD-10-CM

## 2017-05-22 NOTE — Progress Notes (Signed)
[name] ([id number]) Visit Report for 05/21/2017 HBO Risk Assessment Details Patient Name: [name], [name] Date of Service: 05/21/2017 8:15 AM Medical Record Number: [id number] Patient Account Number: [id number] Date of Birth/Sex: 03-Nov-1975 (41 y.o. Male) Treating RN: Cornell Barman Primary Care Carrolyn Hilmes: Donavan Burnet Other Clinician: Referring Kattia Selley: Donavan Burnet Treating Katelen Luepke/Extender: [name], [name]: 0 HBO Risk Assessment Items Answer Barotrauma Risks: Upper Respiratory Infections No Prior Radiation Treatment to Head/Neck Yes Tracheostomy No Ear problems or surgery (otosclerosis)- Consider pressure equalization tubes No Sinus Problems, Sinus Obstruction No Pulmonary Risks: Currently seeing a pulmonologisto No Emphysema No Pneumothorax No Tuberculosis No Other lung problems (COPD with CO2 retention, lesions, surgery) -Refer to CPGs No Congestive heart Failure -Consider holding HBO if ejection fraction<30% No History of smoking No Bullous Disease, Blebs No Other pulmonary abnormalities No Cardiac Risks: Currently seeing a cardiologisto No Pacemaker/AICD No Hypertension No Diuretic Used (water pill). If yes, last time taken: No History of prior or current malignancy (Cancer) Surgery Yes Radiation therapy Yes If Yes for Radiation Therapy, number of treatments received: 36 Chemotherapy No Ophthalmic Risks: Optic Neuritis No [name] ([id number]) Cataracts No Myopia No Retinopathy or Retinal Detachment Surgery- Consider pressure equalization tubes No Confinement Anxiety Claustrophobia No Dialysis Dialysis No Any implants; medical or non-medical No Pregnancy No Diabetes HgbA1C within 3 months No Seizures Seizures No Currently using these medications: Aspirin No Digoxin (CHF patient) No Narcotics No Nitroprusside No Phenothiazine (Thorazine,etc.) No Prednisone or other steroids No Disulfiram (Antabuse) No Mafenide Acetate  (Sulfamylon-burn cream) No Amiodarone No Electronic Signature(s) Signed: 05/21/2017 5:47:00 PM By: Gretta Cool, BSN, RN, CWS, Kim RN, BSN Entered By: Gretta Cool, BSN, RN, CWS, Kim on 05/21/2017 34:19:62

## 2017-05-22 NOTE — Progress Notes (Signed)
[name], [name] ([id number]) Visit Report for 05/21/2017 HBO Details Patient Name: [name], [name] Date of Service: 05/21/2017 9:30 AM Medical Record Number: [id number] Patient Account Number: [id number] Date of Birth/Sex: 12/17/1975 (41 y.o. 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 HBO Treatment Details Treatment Number: 1 Patient Type: Outpatient Chamber Type: Monoplace Chamber Serial #: [id number] Treatment Protocol: 2.0 ATA with 90 minutes oxygen, and no air breaks Treatment Details Compression Rate Down: 1.0 psi / minute De-Compression Rate Up: 1.0 psi / minute Air breaks and breathing Compress Tx Pressure periods Decompress Decompress Begins Reached (leave unused spaces Begins Ends blank) Chamber Pressure (ATA) 1 2 - - - - - - 2 1 Clock Time (24 hr) 08:23 08:40 - - - - - - 10:10 10:24 Treatment Length: 121 (minutes) Treatment Segments: 4 Capillary Blood Glucose Pre Capillary Blood Glucose (mg/dl): Post Capillary Blood Glucose (mg/dl): Vital Signs Capillary Blood Glucose Reference Range: 80 - 120 mg / dl HBO Diabetic Blood Glucose Intervention Range: <131 mg/dl or >249 mg/dl Time Vitals Blood Respiratory Capillary Blood Glucose Pulse Action Type: Pulse: Temperature: Taken: Pressure: Rate: Glucose (mg/dl): Meter #: Oximetry (%) Taken: Pre 07:53 118/64 70 16 98 Post 10:35 102/62 72 16 97.5 Treatment Response VICTORHUGO, PREIS ([id number]) Treatment Well Toleration: Treatment Treatment Completed without Adverse Event Completion Status: Noriah Osgood Notes Patient appears to be doing very well with HBO therapy today no  complications was noted during the dive. HBO Attestation I certify that I supervised this HBO treatment in accordance with Medicare guidelines. A trained Yes emergency response team is readily available per hospital policies and procedures. Continue HBOT as ordered. Yes Electronic Signature(s) Signed: 05/21/2017 4:13:08 PM By: Worthy Keeler PA-C Entered By: Worthy Keeler on 05/21/2017 11:58:19 [name] ([id number]) -------------------------------------------------------------------------------- HBO Safety Checklist Details Patient Name: [name] Date of Service: 05/21/2017 9:30 AM Medical Record Number: [id number] Patient Account Number: [id number] Date of Birth/Sex: 07-Sep-1976 (41 y.o. Male) Treating RN: Primary Care Doloris Servantes: Donavan Burnet Other Clinician: Referring Toniann Dickerson: Donavan Burnet Treating Garrin Kirwan/Extender: [name], [name]: 0 HBO Safety Checklist Items Safety Checklist Consent Form Signed Patient voided / foley secured and emptied When did you last eato 05/21/17 am Last dose of injectable or oral agent n/a NA Ostomy pouch emptied and vented if applicable NA All implantable devices assessed, documented and approved NA Intravenous access site secured and place Valuables secured Linens and cotton and cotton/polyester blend (less than 51% polyester) Personal oil-based products / skin lotions / body lotions removed NA Wigs or hairpieces removed NA Smoking or tobacco materials removed Books / newspapers / magazines / loose paper removed Cologne, aftershave, perfume and deodorant removed Jewelry removed (may wrap wedding band) NA Make-up removed Hair care products removed Battery operated devices (external) removed Heating patches and chemical warmers removed NA Titanium eyewear removed NA Nail polish cured greater than 10 hours NA Casting material cured greater than 10 hours NA Hearing aids removed NA Loose dentures or partials  removed NA Prosthetics have been removed Patient demonstrates correct use of air break device (if applicable) Patient concerns have been addressed Patient grounding bracelet on and cord attached to chamber Specifics for Inpatients (complete in addition to above) Medication sheet sent with patient Intravenous medications  needed or due during therapy sent with patient KALLEN, MCCRYSTAL ([id number]) Drainage tubes (e.g. nasogastric tube or chest tube secured and vented) Endotracheal or Tracheotomy tube secured Cuff deflated of air and inflated with saline Airway suctioned Electronic Signature(s) Signed: 05/21/2017 12:54:56 PM By: Lorine Bears RCP, RRT, CHT Entered By: Becky Sax, Amado Nash on 05/21/2017 08:52:04

## 2017-05-22 NOTE — Progress Notes (Addendum)
HERALD, VALLIN ([id number]) Visit Report for 05/21/2017 Arrival Information Details Patient Name: [name] Date of Service: 05/21/2017 8:15 AM Medical Record Number: [id number] Patient Account Number: [id number] Date of Birth/Sex: 1976/01/28 (41 y.o. Male) Treating RN: Cornell Barman Primary Care Cassadee Vanzandt: Donavan Burnet Other Clinician: Referring Asiel Chrostowski: Donavan Burnet Treating Josue Kass/Extender: Frann Rider in Treatment: 0 Visit Information History Since Last Visit Pain Present Now: No Patient Arrived: Ambulatory Arrival Time: 08:03 Accompanied By: self Transfer Assistance: None Patient Identification Verified: Yes Secondary Verification Process Yes Completed: Patient Requires Transmission-Based No Precautions: Patient Has Alerts: No Electronic Signature(s) Signed: 05/21/2017 12:51:07 PM By: Gretta Cool, BSN, RN, CWS, Kim RN, BSN Entered By: Gretta Cool, BSN, RN, CWS, Kim on 05/21/2017 12:51:07 [name] ([id number]) -------------------------------------------------------------------------------- Encounter Discharge Information Details Patient Name: [name] Date of Service: 05/21/2017 8:15 AM Medical Record Number: [id number] Patient Account Number: [id number] Date of Birth/Sex: 1976-09-12 (41 y.o. Male) Treating RN: Cornell Barman Primary Care Lizeth Bencosme: Donavan Burnet Other Clinician: Referring Ladonna Vanorder: Donavan Burnet Treating Japleen Tornow/Extender: [name], [name]: 0 Encounter Discharge Information Items Discharge Pain Level: 0 Discharge Condition: Stable Ambulatory Status: Ambulatory Other (Note Discharge Destination: Required) Transportation: Private Auto Accompanied By: self Schedule Follow-up Appointment: Yes Medication Reconciliation completed and provided to Patient/Care Yes Maliek Schellhorn: Clinical Summary of Care: Notes Heading to HBO treament Electronic Signature(s) Signed: 05/21/2017 5:47:00 PM By: Gretta Cool, BSN, RN, CWS, Kim RN, BSN Entered  By: Gretta Cool, BSN, RN, CWS, Kim on 05/21/2017 08:13:14 [name] ([id number]) -------------------------------------------------------------------------------- [place] Details Patient Name: [name] Date of Service: 05/21/2017 8:15 AM Medical Record Number: [id number] Patient Account Number: [id number] Date of Birth/Sex: [birth year] (41 y.o. Male) Treating RN: Cornell Barman Primary Care Adolfo Granieri: Donavan Burnet Other Clinician: Referring Dottie Vaquerano: Donavan Burnet Treating Noelly Lasseigne/Extender: Worthy Keeler Weeks in Treatment: 0 Active Inactive Electronic Signature(s) Signed: 07/30/2017 8:38:21 AM By: Gretta Cool, BSN, RN, CWS, Kim RN, BSN Previous Signature: 05/21/2017 5:47:00 PM Version By: Gretta Cool, BSN, RN, CWS, Kim RN, BSN Entered By: Gretta Cool, BSN, RN, CWS, Kim on 07/30/2017 08:38:20 [name] ([id number]) -------------------------------------------------------------------------------- Pain Assessment Details Patient Name: [name] Date of Service: 05/21/2017 8:15 AM Medical Record Number: [id number] Patient Account Number: [id number] Date of Birth/Sex: Jul 12, 1976 (41 y.o. Male) Treating RN: Cornell Barman Primary Care Tavarious Freel: Donavan Burnet Other Clinician: Referring Malina Geers: Donavan Burnet Treating Estuardo Frisbee/Extender: Frann Rider in Treatment: 0 Active Problems Location of Pain Severity and Description of Pain Patient Has Paino Yes Site Locations Pain Location: Generalized Pain With Dressing Change: Yes Duration of the Pain. Constant / Intermittento Intermittent Rate the pain. Current Pain Level: 3 Worst Pain Level: 10 Least Pain Level: 3 Pain Management and Medication Current Pain Management: Electronic Signature(s) Signed: 05/21/2017 12:51:14 PM By: Gretta Cool, BSN, RN, CWS, Kim RN, BSN Entered By: Gretta Cool, BSN, RN, CWS, Kim on 05/21/2017 12:51:14 [name]  ([id number]) -------------------------------------------------------------------------------- Patient/Caregiver Education Details Patient Name: [name] Date of Service: 05/21/2017 8:15 AM Medical Record Number: [id number] Patient Account Number: [id number] Date of Birth/Gender: Jan 17, 1976 (41 y.o. Male) Treating RN: Cornell Barman Primary Care Physician: Donavan Burnet Other Clinician: Referring Physician: Donavan Burnet Treating Physician/Extender: Sharalyn Ink in Treatment: 0 Education Assessment Education Provided To: Patient Education Topics Provided Hyperbaric Oxygenation: Handouts: Hyperbaric Oxygen Methods: Demonstration, Explain/Verbal Responses: State content correctly Electronic Signature(s) Signed: 05/21/2017 5:47:00 PM By: Gretta Cool, BSN, RN, CWS, Kim RN, BSN Entered By: Gretta Cool, BSN, RN, CWS, Kim on 05/21/2017 08:13:35 [name] ([id number]) -------------------------------------------------------------------------------- [place] Details Patient Name: [name] Date of Service: 05/21/2017 8:15 AM Medical  Record Number: [id number] Patient Account Number: [id number] Date of Birth/Sex: Apr 30, 1976 (41 y.o. Male) Treating RN: Cornell Barman Primary Care Stedman [place]: Donavan Burnet Other Clinician: Referring Johnita Palleschi: Donavan Burnet Treating Nylah Butkus/Extender: Frann Rider in Treatment: 0 Vital Signs Time Taken: 08:05 Temperature (F): 98.0 Height (in): 70 Pulse (bpm): 70 Weight (lbs): 145 Respiratory Rate (breaths/min): 16 Body Mass Index (BMI): 20.8 Blood Pressure (mmHg): 118/64 Reference Range: 80 - 120 mg / dl Electronic Signature(s) Signed: 05/21/2017 12:51:25 PM By: Gretta Cool, BSN, RN, CWS, Kim RN, BSN Entered By: Gretta Cool, BSN, RN, CWS, Kim on 05/21/2017 12:51:25

## 2017-05-22 NOTE — Progress Notes (Signed)
NAVY, BELAY ([id number]) Visit Report for 05/21/2017 Problem List Details Patient Name: [name], [name] Date of Service: 05/21/2017 9:30 AM Medical Record Number: [id number] Patient Account Number: [id number] Date of Birth/Sex: 15-Jan-1976 (41 y.o. 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

## 2017-05-22 NOTE — Progress Notes (Signed)
[name], [name] ([id number]) Visit Report for 05/22/2017 Arrival Information Details Patient Name: [name], [name] Date of Service: 05/22/2017 8:00 AM Medical Record Number: [id number] Patient Account Number: [id number] Date of Birth/Sex: [birth year] (41 y.o. Male) Treating RN: Primary Care Enrica Corliss: Donavan Burnet Other Clinician: Jacqulyn Bath Referring Ronak Duquette: Donavan Burnet Treating Falon Huesca/Extender: [name], [name]: 0 Visit Information History Since Last Visit Added or deleted any medications: No Patient Arrived: Ambulatory Any new allergies or adverse reactions: No Arrival Time: 07:50 Had a fall or experienced change in No Accompanied By: self activities of daily living that may affect Transfer Assistance: None risk of falls: Patient Identification Verified: Yes Signs or symptoms of abuse/neglect since last No Secondary Verification Process Yes visito Completed: Hospitalized since last visit: No Patient Requires Transmission-Based No Pain Present Now: No Precautions: Patient Has Alerts: No Electronic Signature(s) Signed: 05/22/2017 10:34:25 AM By: Lorine Bears RCP, RRT, CHT Entered By: Lorine Bears on 05/22/2017 08:12:29 [name] ([id number]) -------------------------------------------------------------------------------- Encounter Discharge Information Details Patient Name: [name] Date of Service: 05/22/2017 8:00 AM Medical Record Number: [id number] Patient Account Number: [id number] Date of Birth/Sex: [birth year] (41 y.o. Male) Treating RN: Primary Care Laddie Naeem: Donavan Burnet Other Clinician: Jacqulyn Bath Referring Challen Spainhour: Donavan Burnet Treating Luke Rigsbee/Extender: [name], [name]: 0 Encounter Discharge Information Items Discharge Pain Level: 0 Discharge Condition: Stable Ambulatory Status: Ambulatory Other (Note Discharge Destination: Required) Transportation: Private  Auto Accompanied By: self Schedule Follow-up Appointment: No Medication Reconciliation completed and provided to Patient/Care No Lashuna Tamashiro: Clinical Summary of Care: Notes Patient will go to work from here. Patient has an HBO treatment scheduled on 05/23/17 at 08:00 am. Electronic Signature(s) Signed: 05/22/2017 10:34:25 AM By: Lorine Bears RCP, RRT, CHT Entered By: Lorine Bears on 05/22/2017 10:34:07 [name] ([id number]) -------------------------------------------------------------------------------- Vitals Details Patient Name: [name] Date of Service: 05/22/2017 8:00 AM Medical Record Number: [id number] Patient Account Number: [id number] Date of Birth/Sex: 1975/11/24 (41 y.o. Male) Treating RN: Primary Care Biddie Sebek: Donavan Burnet Other Clinician: Jacqulyn Bath Referring Daryl Quiros: Donavan Burnet Treating Mehkai Gallo/Extender: [name], [name]: 0 Vital Signs Time Taken: 07:57 Temperature (F): 98.0 Height (in): 70 Pulse (bpm): 60 Weight (lbs): 145 Respiratory Rate (breaths/min): 16 Body Mass Index (BMI): 20.8 Blood Pressure (mmHg): 104/64 Reference Range: 80 - 120 mg / dl Electronic Signature(s) Signed: 05/22/2017 10:34:25 AM By: Lorine Bears RCP, RRT, CHT Entered By: Lorine Bears on 05/22/2017 08:13:01

## 2017-05-22 NOTE — Progress Notes (Signed)
EARLE, TROIANO ([id number]) Visit Report for 05/21/2017 Arrival Information Details Patient Name: [name], [name] Date of Service: 05/21/2017 9:30 AM Medical Record Number: [id number] Patient Account Number: [id number] Date of Birth/Sex: [birth year] (41 y.o. Male) Treating RN: Primary Care Tal Kempker: Donavan Burnet Other Clinician: Referring Suhan Paci: Donavan Burnet Treating Zaniya Mcaulay/Extender: Frann Rider in Treatment: 0 Visit Information Patient Arrived: Ambulatory Arrival Time: 07:53 Accompanied By: self Transfer Assistance: None Patient Identification Verified: Yes Secondary Verification Process Yes Completed: Patient Requires Transmission-Based No Precautions: Patient Has Alerts: No Electronic Signature(s) Signed: 05/21/2017 12:54:56 PM By: Lorine Bears RCP, RRT, CHT Entered By: Lorine Bears on 05/21/2017 08:50:35 [name] ([id number]) -------------------------------------------------------------------------------- Encounter Discharge Information Details Patient Name: [name] Date of Service: 05/21/2017 9:30 AM Medical Record Number: [id number] Patient Account Number: [id number] Date of Birth/Sex: [birth year] (42 y.o. Male) Treating RN: Primary Care Brodrick Curran: Donavan Burnet Other Clinician: Referring Yuleidy Rappleye: Donavan Burnet Treating Machel Violante/Extender: [name], [name]: 0 Encounter Discharge Information Items Discharge Pain Level: 0 Discharge Condition: Stable Ambulatory Status: Ambulatory Discharge Destination: Home Private Transportation: Auto Accompanied By: self Schedule Follow-up Appointment: No Medication Reconciliation completed and No provided to Patient/Care Jelina Paulsen: Clinical Summary of Care: Notes Patient has an HBO treatment scheduled on 05/22/17 at 08:00 am. Electronic Signature(s) Signed: 05/21/2017 12:54:56 PM By: Lorine Bears RCP, RRT, CHT Entered By: Lorine Bears on 05/21/2017 10:39:57 [name] ([id number]) -------------------------------------------------------------------------------- Vitals Details Patient Name: [name] Date of Service: 05/21/2017 9:30 AM Medical Record Number: [id number] Patient Account Number: [id number] Date of Birth/Sex: Feb 06, 1976 (41 y.o. Male) Treating RN: Primary Care Chavis Tessler: Donavan Burnet Other Clinician: Referring Rainn Bullinger: Donavan Burnet Treating Delon Revelo/Extender: STONE III, [name]: 0 Vital Signs Time Taken: 07:53 Temperature (F): 98.0 Height (in): 70 Pulse (bpm): 70 Weight (lbs): 145 Respiratory Rate (breaths/min): 16 Body Mass Index (BMI): 20.8 Blood Pressure (mmHg): 118/64 Reference Range: 80 - 120 mg / dl Electronic Signature(s) Signed: 05/21/2017 12:54:56 PM By: Lorine Bears RCP, RRT, CHT Entered By: Becky Sax, Amado Nash on 05/21/2017 08:51:06

## 2017-05-23 ENCOUNTER — Telehealth: Payer: Self-pay | Admitting: *Deleted

## 2017-05-23 ENCOUNTER — Encounter: Payer: Managed Care, Other (non HMO) | Admitting: Physician Assistant

## 2017-05-23 DIAGNOSIS — L598 Other specified disorders of the skin and subcutaneous tissue related to radiation: Secondary | ICD-10-CM | POA: Diagnosis not present

## 2017-05-23 NOTE — Progress Notes (Signed)
CHEROKEE, CLOWERS ([id number]) Visit Report for 05/22/2017 HBO Details Patient Name: [name], [name] Date of Service: 05/22/2017 8:00 AM Medical Record Number: [id number] Patient Account Number: [id number] Date of Birth/Sex: [birth year] (41 y.o. 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 HBO Treatment Details Treatment Number: 2 Patient Type: Outpatient Chamber Type: Monoplace Chamber Serial #: [id number] Treatment Protocol: 2.0 ATA with 90 minutes oxygen, and no air breaks Treatment Details Compression Rate Down: 1.0 psi / minute De-Compression Rate Up: 1.5 psi / minute Air breaks and breathing Compress Tx Pressure periods Decompress Decompress Begins Reached (leave unused spaces Begins Ends blank) Chamber Pressure (ATA) 1 2 - - - - - - 2 1 Clock Time (24 hr) 08:01 08:19 - - - - - - 09:49 09:59 Treatment Length: 118 (minutes) Treatment Segments: 4 Capillary Blood Glucose Pre Capillary Blood Glucose (mg/dl): Post Capillary Blood Glucose (mg/dl): Vital Signs Capillary Blood Glucose Reference Range: 80 - 120 mg / dl HBO Diabetic Blood Glucose Intervention Range: <131 mg/dl or >249 mg/dl Time Vitals Blood Respiratory Capillary Blood Glucose Pulse Action Type: Pulse: Temperature: Taken: Pressure: Rate: Glucose (mg/dl): Meter #: Oximetry (%) Taken: Pre 07:57 104/64 60 16 98 Post 10:05 100/66 66 16 97.6 Treatment Response RAQUEL, SAYRES ([id number]) Treatment Well Toleration: Treatment Treatment Completed without Adverse Event Completion Status: Chinmay Squier Notes Patient notes that he had some popping and cracking  type feelings in his ears this morning upon waking but otherwise he's had no ear pain with HBO therapy. He tolerated treatment today without complication. He states that when he went to see your, nose, and throat upstairs that they did not want to do to especially on the left side due to the radiation damage and the potential for nonhealing. Nonetheless he seems to be tolerating the HBO therapy without complication. Post Treatment Teed Score Post Treatment Teed Score: Left Ear Grade 0 Post Treatment Teed Score: Right Ear Grade 0 HBO Attestation I certify that I supervised this HBO treatment in accordance with Medicare guidelines. A trained Yes emergency response team is readily available per hospital policies and procedures. Continue HBOT as ordered. Yes Electronic Signature(s) Signed: 05/22/2017 5:26:05 PM By: Worthy Keeler PA-C Previous Signature: 05/22/2017 10:34:25 AM Version By: Lorine Bears RCP, RRT, CHT Entered By: Worthy Keeler on 05/22/2017 11:13:57 [name] ([id number]) -------------------------------------------------------------------------------- HBO Safety Checklist Details Patient Name: [name] Date of Service: 05/22/2017 8:00 AM Medical Record Number: [id number] Patient Account Number: [id number] Date of Birth/Sex: 1975-12-17 (41 y.o. Male) Treating RN: Primary Care Reid Nawrot: Donavan Burnet Other Clinician: Jacqulyn Bath Referring Deven Audi: Donavan Burnet Treating Petrina Melby/Extender: [name], [name]: 0 HBO Safety Checklist Items Safety Checklist Consent Form Signed Patient voided / foley secured and emptied When did you last eato 05/21/17 pm Last dose of injectable or oral agent n/a NA Ostomy pouch emptied and vented if applicable NA All implantable devices assessed, documented and approved NA Intravenous access site secured and place Valuables secured Linens and cotton and cotton/polyester blend (less than  51% polyester) Personal oil-based products / skin lotions / body lotions removed NA Wigs or hairpieces removed NA Smoking or tobacco materials removed Books / newspapers / magazines / loose paper removed Cologne, aftershave, perfume and  deodorant removed Jewelry removed (may wrap wedding band) NA Make-up removed Hair care products removed Battery operated devices (external) removed Heating patches and chemical warmers removed NA Titanium eyewear removed NA Nail polish cured greater than 10 hours NA Casting material cured greater than 10 hours NA Hearing aids removed NA Loose dentures or partials removed NA Prosthetics have been removed Patient demonstrates correct use of air break device (if applicable) Patient concerns have been addressed Patient grounding bracelet on and cord attached to chamber Specifics for Inpatients (complete in addition to above) Medication sheet sent with patient Intravenous medications needed or due during therapy sent with patient AXTEN, PASCUCCI ([id number]) Drainage tubes (e.g. nasogastric tube or chest tube secured and vented) Endotracheal or Tracheotomy tube secured Cuff deflated of air and inflated with saline Airway suctioned Electronic Signature(s) Signed: 05/22/2017 10:34:25 AM By: Lorine Bears RCP, RRT, CHT Entered By: Lorine Bears on 05/22/2017 08:14:39

## 2017-05-23 NOTE — Telephone Encounter (Signed)
Oncology Nurse Navigator Documentation  Called patient to coordinate upcoming scans, labs, LVMM requesting cal-back.  [name], RN, BSN, [place] On[facility] Nurse [place] [facility] [place] [phone]

## 2017-05-23 NOTE — Telephone Encounter (Signed)
Oncology Nurse Navigator Documentation  Spoke with Mr. Ragas.  He indicated his hyperbaric tmts finish by 10:15 so scheduling of scans/labs shortly after is best for him. He said he leaves for work here in [employer] after tmts so scheduling around 10:30 will accommodate his needs.  He voiced understanding CT has been approved so will be scheduled along with labs ASAP, MRI will similarly be scheduled when approved.  He asked for VMMs with appt information.  Reinaldo Berber, Dr. Isidore Moos, [place] notified.  [name], RN, BSN, [place] On[facility] Nurse [place] [facility] [place] [phone]

## 2017-05-23 NOTE — Progress Notes (Signed)
BARRE, AYDELOTT ([id number]) Visit Report for 05/22/2017 Problem List Details Patient Name: NICKHOLAS, GOLDSTON Date of Service: 05/22/2017 8:00 AM Medical Record Number: [id number] Patient Account Number: [id number] Date of Birth/Sex: 30-Dec-1975 (41 y.o. 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

## 2017-05-24 ENCOUNTER — Encounter: Payer: Managed Care, Other (non HMO) | Admitting: Physician Assistant

## 2017-05-24 DIAGNOSIS — L598 Other specified disorders of the skin and subcutaneous tissue related to radiation: Secondary | ICD-10-CM | POA: Diagnosis not present

## 2017-05-24 NOTE — Progress Notes (Signed)
[name], [name] ([id number]) Visit Report for 05/23/2017 Arrival Information Details Patient Name: [name], [name] Date of Service: 05/23/2017 8:00 AM Medical Record Number: [id number] Patient Account Number: [id number] Date of Birth/Sex: 1976/03/16 (41 y.o. Male) Treating RN: Primary Care [name]: [name] Other Clinician: Jacqulyn [name] Referring [name]: [name] Treating [name]/Extender: [name], [name]: 0 Visit Information History Since Last Visit Added or deleted any medications: No Patient Arrived: Ambulatory Any new allergies or adverse reactions: No Arrival Time: 07:50 Had a fall or experienced change in No Accompanied By: self activities of daily living that may affect Transfer Assistance: None risk of falls: Patient Identification Verified: Yes Signs or symptoms of abuse/neglect since last No Secondary Verification Process Yes visito Completed: Hospitalized since last visit: No Patient Requires Transmission-Based No Pain Present Now: No Precautions: Patient Has Alerts: No Electronic Signature(s) Signed: 05/23/2017 10:46:29 AM By: Lorine Bears RCP, RRT, CHT Entered By: Lorine Bears on 05/23/2017 08:09:24 [name] ([id number]) -------------------------------------------------------------------------------- Encounter Discharge Information Details Patient Name: [name] Date of Service: 05/23/2017 8:00 AM Medical Record Number: [id number] Patient Account Number: [id number] Date of Birth/Sex: 05-17-1976 (41 y.o. Male) Treating RN: Primary Care Mayjor Ager: [name] Other Clinician: Jacqulyn [name] Referring Rivka Baune: [name] Treating Ziah Leandro/Extender: [name], [name]: 0 Encounter Discharge Information Items Discharge Pain Level: 0 Discharge Condition: Stable Ambulatory Status: Ambulatory Other (Note Discharge Destination: Required) Transportation: Private  Auto Accompanied By: self Schedule Follow-up Appointment: No Medication Reconciliation completed and provided to Patient/Care No Xavion Muscat: Clinical Summary of Care: Notes Patient will go to work from here. Patient has an HBO [name] scheduled on 05/24/17 at 08:00 am. Electronic Signature(s) Signed: 05/23/2017 10:46:29 AM By: Lorine Bears RCP, RRT, CHT Entered By: Lorine Bears on 05/23/2017 10:43:06 [name] ([id number]) -------------------------------------------------------------------------------- Vitals Details Patient Name: [name] Date of Service: 05/23/2017 8:00 AM Medical Record Number: [id number] Patient Account Number: [id number] Date of Birth/Sex: [birth year] (41 y.o. Male) Treating RN: Primary Care Raiven Belizaire: [name] Other Clinician: Jacqulyn [name] Referring Hemi Chacko: [name] Treating Yasmen Cortner/Extender: [name], [name]: 0 Vital Signs Time Taken: 07:57 Temperature (F): 97.9 Height (in): 70 Pulse (bpm): 72 Weight (lbs): 145 Respiratory Rate (breaths/min): 16 Body Mass Index (BMI): 20.8 Blood Pressure (mmHg): 102/62 Reference Range: 80 - 120 mg / dl Electronic Signature(s) Signed: 05/23/2017 10:46:29 AM By: Lorine Bears RCP, RRT, CHT Entered By: Lorine Bears on 05/23/2017 08:10:19

## 2017-05-25 ENCOUNTER — Telehealth: Payer: Self-pay | Admitting: Radiation Oncology

## 2017-05-25 ENCOUNTER — Other Ambulatory Visit
Admission: RE | Admit: 2017-05-25 | Discharge: 2017-05-25 | Disposition: A | Discharge status: Home / Self Care | Payer: Managed Care, Other (non HMO) | Source: Ambulatory Visit | Attending: Radiation Oncology | Admitting: Radiation Oncology

## 2017-05-25 ENCOUNTER — Encounter: Payer: Managed Care, Other (non HMO) | Admitting: Physician Assistant

## 2017-05-25 DIAGNOSIS — L598 Other specified disorders of the skin and subcutaneous tissue related to radiation: Secondary | ICD-10-CM | POA: Diagnosis not present

## 2017-05-25 DIAGNOSIS — C090 Malignant neoplasm of tonsillar fossa: Secondary | ICD-10-CM | POA: Diagnosis not present

## 2017-05-25 LAB — CBC WITH DIFFERENTIAL/PLATELET
Basophils Absolute: 0 10*3/uL (ref 0–0.1)
Basophils Relative: 0 %
EOS ABS: 0.1 10*3/uL (ref 0–0.7)
Eosinophils Relative: 2 %
HCT: 42.6 % (ref 40.0–52.0)
HEMOGLOBIN: 14.6 g/dL (ref 13.0–18.0)
LYMPHS ABS: 0.6 10*3/uL — AB (ref 1.0–3.6)
Lymphocytes Relative: 20 %
MCH: 31.1 pg (ref 26.0–34.0)
MCHC: 34.3 g/dL (ref 32.0–36.0)
MCV: 90.9 fL (ref 80.0–100.0)
Monocytes Absolute: 0.4 10*3/uL (ref 0.2–1.0)
Monocytes Relative: 15 %
NEUTROS PCT: 63 %
Neutro Abs: 1.7 10*3/uL (ref 1.4–6.5)
Platelets: 196 10*3/uL (ref 150–440)
RBC: 4.69 MIL/uL (ref 4.40–5.90)
RDW: 12.4 % (ref 11.5–14.5)
WBC: 2.8 10*3/uL — AB (ref 3.8–10.6)

## 2017-05-25 LAB — COMPREHENSIVE METABOLIC PANEL
ALT: 10 U/L — AB (ref 17–63)
AST: 14 U/L — AB (ref 15–41)
Albumin: 4.7 g/dL (ref 3.5–5.0)
Alkaline Phosphatase: 40 U/L (ref 38–126)
Anion gap: 6 (ref 5–15)
BUN: 16 mg/dL (ref 6–20)
CHLORIDE: 104 mmol/L (ref 101–111)
CO2: 30 mmol/L (ref 22–32)
CREATININE: 1.03 mg/dL (ref 0.61–1.24)
Calcium: 9.8 mg/dL (ref 8.9–10.3)
GFR calc Af Amer: >60 mL/min (ref >60)
Glucose, Bld: 83 mg/dL (ref 65–99)
POTASSIUM: 4.7 mmol/L (ref 3.5–5.1)
SODIUM: 140 mmol/L (ref 135–145)
Total Bilirubin: 2.6 mg/dL — ABNORMAL HIGH (ref 0.3–1.2)
Total Protein: 7.3 g/dL (ref 6.5–8.1)

## 2017-05-25 LAB — T4, FREE: FREE T4: 1.16 ng/dL — AB (ref 0.61–1.12)

## 2017-05-25 LAB — TSH: TSH: 1.715 u[IU]/mL (ref 0.350–4.500)

## 2017-05-25 NOTE — Progress Notes (Signed)
[name], [name] ([id number]) Visit Report for 05/24/2017 Arrival Information Details Patient Name: [name], [name] Date of Service: 05/24/2017 8:00 AM Medical Record Number: [id number] Patient Account Number: [id number] Date of Birth/Sex: [birth year] (41 y.o. Male) Treating RN: Primary Care Jacobi Nile: Donavan Burnet Other Clinician: Jacqulyn Bath Referring Nadia Torr: Donavan Burnet Treating Tanesha Arambula/Extender: [name], [name]: 1 Visit Information History Since Last Visit Added or deleted any medications: No Patient Arrived: Ambulatory Any new allergies or adverse reactions: No Arrival Time: 07:50 Had a fall or experienced change in No Accompanied By: self activities of daily living that may affect Transfer Assistance: None risk of falls: Patient Identification Verified: Yes Signs or symptoms of abuse/neglect since last No Secondary Verification Process Yes visito Completed: Hospitalized since last visit: No Patient Requires Transmission-Based No Pain Present Now: No Precautions: Patient Has Alerts: No Electronic Signature(s) Signed: 05/24/2017 10:36:42 AM By: Lorine Bears RCP, RRT, CHT Entered By: Lorine Bears on 05/24/2017 08:39:22 [name] ([id number]) -------------------------------------------------------------------------------- Encounter Discharge Information Details Patient Name: [name] Date of Service: 05/24/2017 8:00 AM Medical Record Number: [id number] Patient Account Number: [id number] Date of Birth/Sex: December 22, 1975 (41 y.o. Male) Treating RN: Primary Care Caidance Sybert: Donavan Burnet Other Clinician: Jacqulyn Bath Referring Brallan Denio: Donavan Burnet Treating Winfred Iiams/Extender: [name], [name]: 1 Encounter Discharge Information Items Discharge Pain Level: 0 Discharge Condition: Stable Ambulatory Status: Ambulatory Other (Note Discharge Destination: Required) Transportation: Private  Auto Accompanied By: self Schedule Follow-up Appointment: No Medication Reconciliation completed and provided to Patient/Care No Ezinne Yogi: Clinical Summary of Care: Notes Patient will go to work from here. Patient has an HBO treatment scheduled on 05/25/17 at 08:00 am. Electronic Signature(s) Signed: 05/24/2017 10:36:42 AM By: Lorine Bears RCP, RRT, CHT Entered By: Lorine Bears on 05/24/2017 10:36:09 [name] ([id number]) -------------------------------------------------------------------------------- Vitals Details Patient Name: [name] Date of Service: 05/24/2017 8:00 AM Medical Record Number: [id number] Patient Account Number: [id number] Date of Birth/Sex: September 06, 1976 (41 y.o. Male) Treating RN: Primary Care Jiraiya Mcewan: Donavan Burnet Other Clinician: Jacqulyn Bath Referring Aveline Daus: Donavan Burnet Treating An Lannan/Extender: [name], [name]: 1 Vital Signs Time Taken: 07:55 Temperature (F): 97.9 Height (in): 70 Pulse (bpm): 66 Weight (lbs): 145 Respiratory Rate (breaths/min): 16 Body Mass Index (BMI): 20.8 Blood Pressure (mmHg): 106/64 Reference Range: 80 - 120 mg / dl Electronic Signature(s) Signed: 05/24/2017 10:36:42 AM By: Lorine Bears RCP, RRT, CHT Entered By: Becky Sax, Amado Nash on 05/24/2017 08:39:49

## 2017-05-25 NOTE — Progress Notes (Signed)
JR, MILLIRON ([id number]) Visit Report for 05/23/2017 HBO Details Patient Name: [name] [name] Date of Service: 05/23/2017 8:00 AM Medical Record Number: [id number] Patient Account Number: [id number] Date of Birth/Sex: 04-03-1976 (41 y.o. 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 HBO Treatment Details Treatment Number: 3 Patient Type: Outpatient Chamber Type: Monoplace Chamber Serial #: [id number] Treatment Protocol: 2.0 ATA with 90 minutes oxygen, and no air breaks Treatment Details Compression Rate Down: 1.0 psi / minute De-Compression Rate Up: 1.5 psi / minute Air breaks and breathing Compress Tx Pressure periods Decompress Decompress Begins Reached (leave unused spaces Begins Ends blank) Chamber Pressure (ATA) 1 2 - - - - - - 2 1 Clock Time (24 hr) 08:03 08:20 - - - - - - 09:50 10:00 Treatment Length: 117 (minutes) Treatment Segments: 4 Capillary Blood Glucose Pre Capillary Blood Glucose (mg/dl): Post Capillary Blood Glucose (mg/dl): Vital Signs Capillary Blood Glucose Reference Range: 80 - 120 mg / dl HBO Diabetic Blood Glucose Intervention Range: <131 mg/dl or >249 mg/dl Time Vitals Blood Respiratory Capillary Blood Glucose Pulse Action Type: Pulse: Temperature: Taken: Pressure: Rate: Glucose (mg/dl): Meter #: Oximetry (%) Taken: Pre 07:57 102/62 72 16 97.9 Post 10:05 102/78 72 16 97.7 Treatment Response [name] [name] [name] [name] ([id number]) Treatment Well Toleration: Treatment Treatment Completed without Adverse Event Completion Status: [name] [name] Notes Patient did very well with HBO therapy today. There  were no complications or concerns which arose. HBO Attestation I certify that I supervised this HBO treatment in accordance with Medicare guidelines. A trained Yes emergency response team is readily available per hospital policies and procedures. Continue HBOT as ordered. Yes Electronic Signature(s) Signed: 05/23/2017 6:07:57 PM By: [name] Keeler PA-C Previous Signature: 05/23/2017 10:46:29 AM Version By: [name] [name] [name] [name] Previous Signature: 05/23/2017 8:27:10 AM Version By: [name] Keeler PA-C Entered By: [name] [name] on 05/23/2017 18:05:20 [name] [name] ([id number]) -------------------------------------------------------------------------------- HBO Safety Checklist Details Patient Name: [name] [name] Date of Service: 05/23/2017 8:00 AM Medical Record Number: [id number] Patient Account Number: [id number] Date of Birth/Sex: June 29, 1976 (41 y.o. Male) Treating RN: Primary Care [name] [name]: [name] [name] Other Clinician: Jacqulyn [name] Referring [name] [name]: [name] [name] Treating [name] [name]: [name] [name], [name] Weeks in Treatment: 0 HBO Safety Checklist Items Safety Checklist Consent Form Signed Patient voided / foley secured and emptied When did you last eato 05/22/17 pm Last dose of injectable or oral agent n/a NA Ostomy pouch emptied and vented if applicable NA All implantable devices assessed, documented and approved NA Intravenous access site secured and place Valuables secured Linens and cotton and cotton/polyester blend (less than 51% polyester) Personal oil-based products / skin lotions / body lotions removed NA Wigs or hairpieces removed NA Smoking or tobacco materials removed Books / newspapers / magazines / loose paper removed Cologne, aftershave, perfume and deodorant removed Jewelry removed (may wrap wedding band) NA Make-up removed Hair care products removed Battery operated devices (external) removed Heating patches and chemical  warmers removed NA Titanium eyewear removed NA Nail polish cured greater than 10 hours NA Casting material cured greater than 10 hours NA Hearing aids removed NA Loose dentures or partials removed NA Prosthetics have been removed Patient demonstrates correct use of air break device (if applicable) Patient concerns have  been addressed Patient grounding bracelet on and cord attached to chamber Specifics for Inpatients (complete in addition to above) Medication sheet sent with patient Intravenous medications needed or due during therapy sent with patient [name] [name] ([id number]) Drainage tubes (e.g. nasogastric tube or chest tube secured and vented) Endotracheal or Tracheotomy tube secured Cuff deflated of air and inflated with saline Airway suctioned Electronic Signature(s) Signed: 05/23/2017 10:46:29 AM By: [name] [name] [name] [name] Entered By: [name] [name] on 05/23/2017 08:11:14

## 2017-05-25 NOTE — Progress Notes (Signed)
[name], [name] ([id number]) Visit Report for 05/23/2017 Problem List Details Patient Name: [name], [name] Date of Service: 05/23/2017 8:00 AM Medical Record Number: [id number] Patient Account Number: [id number] Date of Birth/Sex: 1976-01-31 (41 y.o. 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

## 2017-05-26 NOTE — Progress Notes (Signed)
[name], [name] ([id number]) Visit Report for 05/25/2017 Arrival Information Details Patient Name: [name] Date of Service: 05/25/2017 8:00 AM Medical Record Number: [id number] Patient Account Number: [id number] Date of Birth/Sex: 04-Feb-1976 (41 y.o. Male) Treating RN: Cornell Barman Primary Care Brennon Otterness: Donavan Burnet Other Clinician: Referring Lothar Prehn: Donavan Burnet Treating Breane Grunwald/Extender: [name], [name]: 1 Visit Information History Since Last Visit Added or deleted any medications: No Patient Arrived: Ambulatory Any new allergies or adverse reactions: No Arrival Time: 07:50 Had a fall or experienced change in No Accompanied By: self activities of daily living that may affect Transfer Assistance: None risk of falls: Patient Identification Verified: Yes Signs or symptoms of abuse/neglect since last No Secondary Verification Process Yes visito Completed: Hospitalized since last visit: No Patient Requires Transmission-Based No Pain Present Now: No Precautions: Patient Has Alerts: No Electronic Signature(s) Signed: 05/25/2017 12:21:44 PM By: Gretta Cool, BSN, RN, CWS, Kim RN, BSN Entered By: Gretta Cool, BSN, RN, CWS, Kim on 05/25/2017 08:08:22 [name] ([id number]) -------------------------------------------------------------------------------- Encounter Discharge Information Details Patient Name: [name] Date of Service: 05/25/2017 8:00 AM Medical Record Number: [id number] Patient Account Number: [id number] Date of Birth/Sex: 15-Jul-1976 (41 y.o. Male) Treating RN: Cornell Barman Primary Care Deshannon Seide: Donavan Burnet Other Clinician: Referring Sherae Santino: Donavan Burnet Treating Concettina Leth/Extender: [name], [name]: 1 Encounter Discharge Information Items Discharge Pain Level: 0 Discharge Condition: Stable Ambulatory Status: Ambulatory Discharge Destination: Home Private Transportation: Auto Accompanied By: self Schedule  Follow-up Appointment: Yes Medication Reconciliation completed and Yes provided to Patient/Care Billee Balcerzak: Clinical Summary of Care: Electronic Signature(s) Signed: 05/25/2017 12:21:44 PM By: Gretta Cool, BSN, RN, CWS, Kim RN, BSN Entered By: Gretta Cool, BSN, RN, CWS, Kim on 05/25/2017 10:22:59 [name] ([id number]) -------------------------------------------------------------------------------- Patient/Caregiver Education Details Patient Name: [name] Date of Service: 05/25/2017 8:00 AM Medical Record Number: [id number] Patient Account Number: [id number] Date of Birth/Gender: 09-Aug-1976 (41 y.o. Male) Treating RN: Cornell Barman Primary Care Physician: Donavan Burnet Other Clinician: Referring Physician: Donavan Burnet Treating Physician/Extender: Sharalyn Ink in Treatment: 1 Education Assessment Education Provided To: Patient Education Topics Provided Hyperbaric Oxygenation: Handouts: Hyperbaric Oxygen Methods: Explain/Verbal Electronic Signature(s) Signed: 05/25/2017 12:21:44 PM By: Gretta Cool, BSN, RN, CWS, Kim RN, BSN Entered By: Gretta Cool, BSN, RN, CWS, Kim on 05/25/2017 10:22:45 [name] ([id number]) -------------------------------------------------------------------------------- Vitals Details Patient Name: [name] Date of Service: 05/25/2017 8:00 AM Medical Record Number: [id number] Patient Account Number: [id number] Date of Birth/Sex: March 30, 1976 (41 y.o. Male) Treating RN: Cornell Barman Primary Care Ellory Khurana: Donavan Burnet Other Clinician: Referring Jaryah Aracena: Donavan Burnet Treating Tymeir Weathington/Extender: [name], [name]: 1 Vital Signs Time Taken: 07:55 Temperature (F): 97.9 Height (in): 70 Pulse (bpm): 68 Weight (lbs): 145 Respiratory Rate (breaths/min): 16 Body Mass Index (BMI): 20.8 Blood Pressure (mmHg): 102/78 Reference Range: 80 - 120 mg / dl Electronic Signature(s) Signed: 05/25/2017 12:21:44 PM By: Gretta Cool, BSN, RN, CWS, Kim RN,  BSN Entered By: Gretta Cool, BSN, RN, CWS, Kim on 05/25/2017 94:58:59

## 2017-05-26 NOTE — Progress Notes (Addendum)
[name], [name] ([id number]) Visit Report for 05/24/2017 HBO Details Patient Name: [name], [name] Date of Service: 05/24/2017 8:00 AM Medical Record Number: [id number] Patient Account Number: [id number] Date of Birth/Sex: 12/30/1975 (41 y.o. 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 HBO Treatment Details Treatment Number: 4 Patient Type: Outpatient Chamber Type: Monoplace Chamber Serial #: [id number] Treatment Protocol: 2.0 ATA with 90 minutes oxygen, and no air breaks Treatment Details Compression Rate Down: 1.0 psi / minute De-Compression Rate Up: 1.5 psi / minute Air breaks and breathing Compress Tx Pressure periods Decompress Decompress Begins Reached (leave unused spaces Begins Ends blank) Chamber Pressure (ATA) 1 2 - - - - - - 2 1 Clock Time (24 hr) 08:00 08:17 - - - - - - 09:48 09:58 Treatment Length: 118 (minutes) Treatment Segments: 4 Capillary Blood Glucose Pre Capillary Blood Glucose (mg/dl): Post Capillary Blood Glucose (mg/dl): Vital Signs Capillary Blood Glucose Reference Range: 80 - 120 mg / dl HBO Diabetic Blood Glucose Intervention Range: <131 mg/dl or >249 mg/dl Time Vitals Blood Respiratory Capillary Blood Glucose Pulse Action Type: Pulse: Temperature: Taken: Pressure: Rate: Glucose (mg/dl): Meter #: Oximetry (%) Taken: Pre 07:55 106/64 66 16 97.9 Post 10:02 126/76 60 16 97.6 Treatment Response [name], [name] ([id number]) Treatment Completion Status: Treatment Completed without Adverse Event [name] Notes Patient's course of HBO therapy today proceeded without any complication. HBO  Attestation I certify that I supervised this HBO treatment in accordance with Medicare guidelines. A trained Yes emergency response team is readily available per hospital policies and procedures. Continue HBOT as ordered. Yes Electronic Signature(s) Signed: 05/28/2017 8:34:49 AM By: [name] Keeler PA-C Previous Signature: 05/24/2017 10:36:42 AM Version By: [name] RCP, RRT, CHT Previous Signature: 05/25/2017 10:18:42 AM Version By: [name] Keeler PA-C Entered By: [name] on 05/25/2017 10:21:47 [name] ([id number]) -------------------------------------------------------------------------------- HBO Safety Checklist Details Patient Name: [name] Date of Service: 05/24/2017 8:00 AM Medical Record Number: [id number] Patient Account Number: [id number] Date of Birth/Sex: [birth year] (41 y.o. Male) Treating RN: Primary Care [name]: [name] Other Clinician: Jacqulyn [name] Referring [name]: [name] Treating [name]/Extender: [name] Weeks in Treatment: 1 HBO Safety Checklist Items Safety Checklist Consent Form Signed Patient voided / foley secured and emptied When did you last eato 05/23/17 pm Last dose of injectable or oral agent n/a NA Ostomy pouch emptied and vented if applicable NA All implantable devices assessed, documented and approved NA Intravenous access site secured and place Valuables secured Linens and cotton and cotton/polyester blend (less than 51% polyester) Personal oil-based products / skin lotions / body lotions removed NA Wigs or hairpieces removed NA Smoking or tobacco materials removed Books / newspapers / magazines / loose paper removed Cologne, aftershave, perfume and deodorant removed Jewelry removed (may wrap wedding band) NA Make-up removed Hair care products removed Battery operated devices (external) removed Heating patches and chemical warmers removed NA Titanium eyewear removed NA Nail  polish cured greater than 10 hours NA Casting material cured greater than 10 hours NA Hearing aids removed NA Loose dentures or partials removed NA Prosthetics have been removed Patient demonstrates correct use of air break device (if applicable) Patient concerns have been addressed Patient grounding bracelet on and cord attached  to chamber Specifics for Inpatients (complete in addition to above) Medication sheet sent with patient Intravenous medications needed or due during therapy sent with patient [name], [name] ([id number]) Drainage tubes (e.g. nasogastric tube or chest tube secured and vented) Endotracheal or Tracheotomy tube secured Cuff deflated of air and inflated with saline Airway suctioned Electronic Signature(s) Signed: 05/24/2017 10:36:42 AM By: [name] RCP, RRT, CHT Entered By: [name] on 05/24/2017 08:40:52

## 2017-05-28 ENCOUNTER — Encounter: Payer: Managed Care, Other (non HMO) | Admitting: Nurse Practitioner

## 2017-05-28 DIAGNOSIS — L598 Other specified disorders of the skin and subcutaneous tissue related to radiation: Secondary | ICD-10-CM | POA: Diagnosis not present

## 2017-05-28 NOTE — Progress Notes (Signed)
DWAYN, MORAVEK ([id number]) Visit Report for 05/24/2017 Problem List Details Patient Name: [name], [name] Date of Service: 05/24/2017 8:00 AM Medical Record Number: [id number] Patient Account Number: [id number] Date of Birth/Sex: 1975/12/26 (41 y.o. 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 G04.89 Other myelitis Facility Procedures CPT4 Code: 84166063 Description: (Facility Use Only) HBOT, full body chamber, 39min Modifier: Quantity: 4 Physician Procedures CPT4: Description Modifier Quantity Code 0160109 32355 - WC PHYS HYPERBARIC OXYGEN THERAPY 1 ICD-10 Description Diagnosis 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

## 2017-05-28 NOTE — Progress Notes (Signed)
JAHMEZ, BILY ([id number]) Visit Report for 05/25/2017 Problem List Details Patient Name: [name], [name] Date of Service: 05/25/2017 8:00 AM Medical Record Number: [id number] Patient Account Number: [id number] Date of Birth/Sex: [birth year] (41 y.o. 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

## 2017-05-28 NOTE — Progress Notes (Signed)
KATO, WIECZOREK ([id number]) Visit Report for 05/28/2017 Arrival Information Details Patient Name: OISIN, YOAKUM Date of Service: 05/28/2017 8:00 AM Medical Record Number: [id number] Patient Account Number: [id number] Date of Birth/Sex: 1976/02/06 (41 y.o. Male) Treating RN: Primary Care Eleftherios Dudenhoeffer: Donavan Burnet Other Clinician: Jacqulyn Bath Referring Kaleeyah Cuffie: Donavan Burnet Treating Lakea Mittelman/Extender: Cathie Olden in Treatment: 1 Visit Information History Since Last Visit Added or deleted any medications: No Patient Arrived: Ambulatory Any new allergies or adverse reactions: No Arrival Time: 07:55 Had a fall or experienced change in No Accompanied By: self activities of daily living that may affect Transfer Assistance: None risk of falls: Patient Identification Verified: Yes Signs or symptoms of abuse/neglect since last No Secondary Verification Process Yes visito Completed: Hospitalized since last visit: No Patient Requires Transmission-Based No Pain Present Now: No Precautions: Patient Has Alerts: No Electronic Signature(s) Signed: 05/28/2017 10:42:48 AM By: Lorine Bears RCP, RRT, CHT Entered By: Lorine Bears on 05/28/2017 08:33:00 [name] ([id number]) -------------------------------------------------------------------------------- Encounter Discharge Information Details Patient Name: [name] Date of Service: 05/28/2017 8:00 AM Medical Record Number: [id number] Patient Account Number: [id number] Date of Birth/Sex: Jun 09, 1976 (41 y.o. Male) Treating RN: Primary Care Jaimen Melone: Donavan Burnet Other Clinician: Jacqulyn Bath Referring Izola Teague: Donavan Burnet Treating Nollie Terlizzi/Extender: Cathie Olden in Treatment: 1 Encounter Discharge Information Items Discharge Pain Level: 0 Discharge Condition: Stable Ambulatory Status: Ambulatory Discharge Destination: Home Private Transportation: Auto Accompanied By:  self Schedule Follow-up Appointment: No Medication Reconciliation completed and No provided to Patient/Care Kyleen Villatoro: Clinical Summary of Care: Notes Patient has an HBO treatment scheduled on 05/29/17 at 08:00 am. Electronic Signature(s) Signed: 05/28/2017 10:42:48 AM By: Lorine Bears RCP, RRT, CHT Entered By: Lorine Bears on 05/28/2017 10:38:37 [name] ([id number]) -------------------------------------------------------------------------------- Vitals Details Patient Name: [name] Date of Service: 05/28/2017 8:00 AM Medical Record Number: [id number] Patient Account Number: [id number] Date of Birth/Sex: 11-10-1975 (41 y.o. Male) Treating RN: Primary Care Rosendo Couser: Donavan Burnet Other Clinician: Jacqulyn Bath Referring Norita Meigs: Donavan Burnet Treating Syair Fricker/Extender: Cathie Olden in Treatment: 1 Vital Signs Time Taken: 08:00 Temperature (F): 98.0 Height (in): 70 Pulse (bpm): 72 Weight (lbs): 145 Respiratory Rate (breaths/min): 16 Body Mass Index (BMI): 20.8 Blood Pressure (mmHg): 102/72 Reference Range: 80 - 120 mg / dl Electronic Signature(s) Signed: 05/28/2017 10:42:48 AM By: Lorine Bears RCP, RRT, CHT Entered By: Becky Sax, Amado Nash on 05/28/2017 08:34:08

## 2017-05-28 NOTE — Progress Notes (Signed)
ROARK, RUFO ([id number]) Visit Report for 05/25/2017 HBO Details Patient Name: [name], [name] Date of Service: 05/25/2017 8:00 AM Medical Record Number: [id number] Patient Account Number: [id number] Date of Birth/Sex: 1976-04-23 (41 y.o. 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 HBO Treatment Details Treatment Number: 5 Patient Type: Outpatient Chamber Type: Monoplace Chamber Serial #: [id number] Treatment Protocol: 2.0 ATA with 90 minutes oxygen, and no air breaks Treatment Details Compression Rate Down: 1.0 psi / minute De-Compression Rate Up: 1.5 psi / minute Air breaks and breathing Compress Tx Pressure periods Decompress Decompress Begins Reached (leave unused spaces Begins Ends blank) Chamber Pressure (ATA) 1 2 - - - - - - 2 1 Clock Time (24 hr) 07:59 08:18 - - - - - - 09:49 09:59 Treatment Length: 120 (minutes) Treatment Segments: 4 Capillary Blood Glucose Pre Capillary Blood Glucose (mg/dl): Post Capillary Blood Glucose (mg/dl): Vital Signs Capillary Blood Glucose Reference Range: 80 - 120 mg / dl HBO Diabetic Blood Glucose Intervention Range: <131 mg/dl or >249 mg/dl Time Vitals Blood Respiratory Capillary Blood Glucose Pulse Action Type: Pulse: Temperature: Taken: Pressure: Rate: Glucose (mg/dl): Meter #: Oximetry (%) Taken: Pre 07:55 102/78 68 16 97.9 Post 10:03 106/70 68 16 Treatment Response ELGAR, SCOGGINS ([id number]) Treatment Well Toleration: Treatment Treatment Completed without Adverse Event Completion Status: Treatment Notes Unable to get patient's temperature. He drank cold water  and had an appointment he needed to get to. HBO Attestation I certify that I supervised this HBO treatment in accordance with Medicare guidelines. A trained Yes emergency response team is readily available per hospital policies and procedures. Continue HBOT as ordered. Yes Electronic Signature(s) Signed: 05/28/2017 8:34:49 AM By: Worthy Keeler PA-C Entered By: Worthy Keeler on 05/25/2017 10:23:20 [name] ([id number]) -------------------------------------------------------------------------------- HBO Safety Checklist Details Patient Name: [name] Date of Service: 05/25/2017 8:00 AM Medical Record Number: [id number] Patient Account Number: [id number] Date of Birth/Sex: June 16, 1976 (41 y.o. Male) Treating RN: Cornell Barman Primary Care Jemarcus Dougal: Donavan Burnet Other Clinician: Referring Evolette Pendell: Donavan Burnet Treating Mak Bonny/Extender: [name], [name]: 1 HBO Safety Checklist Items Safety Checklist Consent Form Signed Patient voided / foley secured and emptied When did you last eato 7am Last dose of injectable or oral agent NA Ostomy pouch emptied and vented if applicable NA All implantable devices assessed, documented and approved NA Intravenous access site secured and place Valuables secured Linens and cotton and cotton/polyester blend (less than 51% polyester) Personal oil-based products / skin lotions / body lotions removed NA Wigs or hairpieces removed NA Smoking or tobacco materials removed NA Books / newspapers / magazines / loose paper removed NA Cologne, aftershave, perfume and deodorant removed NA Jewelry removed (may wrap wedding band) NA Make-up removed NA Hair care products removed NA Battery operated devices (external) removed NA Heating patches and chemical warmers removed NA Titanium eyewear removed NA Nail polish cured greater than 10 hours NA Casting material cured greater than 10 hours NA Hearing aids removed NA Loose  dentures or partials removed NA Prosthetics have been removed Patient demonstrates correct use of air break device (if applicable) Patient concerns have been addressed Patient grounding bracelet on and cord attached to chamber Specifics for Inpatients (complete in addition to above)  Medication sheet sent with patient Intravenous medications needed or due during therapy sent with patient RUFFUS, KAMAKA ([id number]) Drainage tubes (e.g. nasogastric tube or chest tube secured and vented) Endotracheal or Tracheotomy tube secured Cuff deflated of air and inflated with saline Airway suctioned Electronic Signature(s) Signed: 05/25/2017 12:21:44 PM By: Gretta Cool, BSN, RN, CWS, Kim RN, BSN Entered By: Gretta Cool, BSN, RN, CWS, Kim on 05/25/2017 08:09:39

## 2017-05-29 ENCOUNTER — Ambulatory Visit: Payer: Managed Care, Other (non HMO)

## 2017-05-29 ENCOUNTER — Encounter: Payer: Managed Care, Other (non HMO) | Admitting: Physician Assistant

## 2017-05-29 DIAGNOSIS — L598 Other specified disorders of the skin and subcutaneous tissue related to radiation: Secondary | ICD-10-CM | POA: Diagnosis not present

## 2017-05-29 NOTE — Progress Notes (Signed)
[name], [name] ([id number]) Visit Report for 05/28/2017 HBO Details Patient Name: [name], [name] Date of Service: 05/28/2017 8:00 AM Medical Record Number: [id number] Patient Account Number: [id number] Date of Birth/Sex: 1976/01/22 (41 y.o. 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 HBO Treatment Details Treatment Number: 6 Patient Type: Outpatient Chamber Type: Monoplace Chamber Serial #: [id number] Treatment Protocol: 2.0 ATA with 90 minutes oxygen, and no air breaks Treatment Details Compression Rate Down: 1.0 psi / minute De-Compression Rate Up: 1.5 psi / minute Air breaks and breathing Compress Tx Pressure periods Decompress Decompress Begins Reached (leave unused spaces Begins Ends blank) Chamber Pressure (ATA) 1 2 - - - - - - 2 1 Clock Time (24 hr) 08:07 08:24 - - - - - - 09:54 10:04 Treatment Length: 117 (minutes) Treatment Segments: 4 Capillary Blood Glucose Pre Capillary Blood Glucose (mg/dl): Post Capillary Blood Glucose (mg/dl): Vital Signs Capillary Blood Glucose Reference Range: 80 - 120 mg / dl HBO Diabetic Blood Glucose Intervention Range: <131 mg/dl or >249 mg/dl Time Vitals Blood Respiratory Capillary Blood Glucose Pulse Action Type: Pulse: Temperature: Taken: Pressure: Rate: Glucose (mg/dl): Meter #: Oximetry (%) Taken: Pre 08:00 102/72 72 16 98 Pre-Treatment Ear Evaluation Left Right EMANI, MORAD ([id number]) Clear: Yes Clear: Yes Intact: Yes Intact: Yes PE Tubes inserted: No PE Tubes inserted: No Irrigated: No Irrigated: No Myringotomy performed: No Myringotomy performed:  No Left Teed Scale: Grade 0 Right Teed Scale: Grade 0 Treatment Response Treatment Well Toleration: Treatment Treatment Completed without Adverse Event Completion Status: Flossie Wexler Notes Patient left before I could get his vital signs post treatment. Post Treatment Teed Score Post Treatment Teed Score: Left Ear Grade 0 Post Treatment Teed Score: Right Ear Grade 0 HBO Attestation I certify that I supervised this HBO treatment in accordance with Medicare guidelines. A trained Yes emergency response team is readily available per hospital policies and procedures. Continue HBOT as ordered. Yes Electronic Signature(s) Signed: 05/28/2017 5:23:40 PM By: Lawanda Cousins Previous Signature: 05/28/2017 10:42:48 AM Version By: Lorine Bears RCP, RRT, CHT Entered By: Lawanda Cousins on 05/28/2017 11:41:53 [name] ([id number]) -------------------------------------------------------------------------------- HBO Safety Checklist Details Patient Name: [name] Date of Service: 05/28/2017 8:00 AM Medical Record Number: [id number] Patient Account Number: [id number] Date of Birth/Sex: [birth year] (41 y.o. Male) Treating RN: Primary Care Abygail Galeno: Donavan Burnet Other Clinician: Jacqulyn Bath Referring Hershy Flenner: Donavan Burnet Treating Shakenya Stoneberg/Extender: Cathie Olden in Treatment: 1 HBO Safety Checklist Items Safety Checklist Consent Form Signed Patient voided / foley secured and emptied When did you last eato 05/27/17 pm Last dose of injectable or oral agent n/a NA Ostomy pouch emptied and vented if applicable NA All implantable devices assessed, documented and approved NA Intravenous access site secured and place Valuables secured Linens and cotton and cotton/polyester blend (less than 51% polyester) Personal oil-based products / skin lotions / body lotions removed NA Wigs or hairpieces removed NA Smoking or tobacco materials removed Books / newspapers /  magazines / loose paper removed Cologne, aftershave, perfume and deodorant removed Jewelry removed (may wrap wedding band) NA Make-up removed Hair care products removed Battery operated devices (external) removed Heating patches and chemical warmers removed NA Titanium eyewear removed NA Nail polish cured greater than 10 hours NA Casting  material cured greater than 10 hours NA Hearing aids removed NA Loose dentures or partials removed NA Prosthetics have been removed Patient demonstrates correct use of air break device (if applicable) Patient concerns have been addressed Patient grounding bracelet on and cord attached to chamber Specifics for Inpatients (complete in addition to above) Medication sheet sent with patient Intravenous medications needed or due during therapy sent with patient SEIF, TEICHERT ([id number]) Drainage tubes (e.g. nasogastric tube or chest tube secured and vented) Endotracheal or Tracheotomy tube secured Cuff deflated of air and inflated with saline Airway suctioned Electronic Signature(s) Signed: 05/28/2017 10:42:48 AM By: Lorine Bears RCP, RRT, CHT Entered By: Becky Sax, Amado Nash on 05/28/2017 08:35:08

## 2017-05-30 ENCOUNTER — Encounter: Payer: Managed Care, Other (non HMO) | Admitting: Physician Assistant

## 2017-05-30 DIAGNOSIS — L598 Other specified disorders of the skin and subcutaneous tissue related to radiation: Secondary | ICD-10-CM | POA: Diagnosis not present

## 2017-05-30 LAB — MISC LABCORP TEST (SEND OUT): Labcorp test code: 1487

## 2017-05-30 NOTE — Progress Notes (Signed)
[name], [name] ([id number]) Visit Report for 05/29/2017 Arrival Information Details Patient Name: [name], [name] Date of Service: 05/29/2017 8:00 AM Medical Record Number: [id number] Patient Account Number: [id number] Date of Birth/Sex: Aug 29, 1976 (41 y.o. Male) Treating RN: Primary Care Alegria Dominique: Donavan Burnet Other Clinician: Jacqulyn Bath Referring Ilay Capshaw: Donavan Burnet Treating Josephine Wooldridge/Extender: Worthy Keeler Weeks in Treatment: 1 Visit Information History Since Last Visit Added or deleted any medications: No Patient Arrived: Ambulatory Any new allergies or adverse reactions: No Arrival Time: 07:50 Had a fall or experienced change in No Accompanied By: self activities of daily living that may affect Transfer Assistance: None risk of falls: Patient Identification Verified: Yes Signs or symptoms of abuse/neglect since last No Secondary Verification Process Yes visito Completed: Hospitalized since last visit: No Patient Requires Transmission-Based No Pain Present Now: No Precautions: Patient Has Alerts: No Electronic Signature(s) Signed: 05/29/2017 10:58:02 AM By: Lorine Bears RCP, RRT, CHT Entered By: Lorine Bears on 05/29/2017 08:27:14 [name] ([id number]) -------------------------------------------------------------------------------- Encounter Discharge Information Details Patient Name: [name] Date of Service: 05/29/2017 8:00 AM Medical Record Number: [id number] Patient Account Number: [id number] Date of Birth/Sex: [birth year] (41 y.o. Male) Treating RN: Primary Care Deserie Dirks: Donavan Burnet Other Clinician: Jacqulyn Bath Referring Yianni Skilling: Donavan Burnet Treating Gisell Buehrle/Extender: [name], [name]: 1 Encounter Discharge Information Items Discharge Pain Level: 0 Discharge Condition: Stable Ambulatory Status: Ambulatory Other (Note Discharge Destination: Required) Transportation: Private  Auto Accompanied By: self Schedule Follow-up Appointment: No Medication Reconciliation completed and provided to Patient/Care No Hatcher Froning: Clinical Summary of Care: Notes Patient has an HBO treatment scheduled on 05/30/17 at 08:00 am. Electronic Signature(s) Signed: 05/29/2017 10:58:02 AM By: Lorine Bears RCP, RRT, CHT Entered By: Lorine Bears on 05/29/2017 10:52:20 [name] ([id number]) -------------------------------------------------------------------------------- Vitals Details Patient Name: [name] Date of Service: 05/29/2017 8:00 AM Medical Record Number: [id number] Patient Account Number: [id number] Date of Birth/Sex: March 16, 1976 (41 y.o. Male) Treating RN: Primary Care Joycelyn Liska: Donavan Burnet Other Clinician: Jacqulyn Bath Referring Akya Fiorello: Donavan Burnet Treating Dhana Totton/Extender: [name], [name]: 1 Vital Signs Time Taken: 08:00 Temperature (F): 98.4 Height (in): 70 Pulse (bpm): 66 Weight (lbs): 145 Respiratory Rate (breaths/min): 16 Body Mass Index (BMI): 20.8 Blood Pressure (mmHg): 102/64 Reference Range: 80 - 120 mg / dl Electronic Signature(s) Signed: 05/29/2017 10:58:02 AM By: Lorine Bears RCP, RRT, CHT Entered By: Becky Sax, Amado Nash on 05/29/2017 08:27:54

## 2017-05-31 ENCOUNTER — Encounter: Payer: Managed Care, Other (non HMO) | Admitting: Physician Assistant

## 2017-05-31 DIAGNOSIS — L598 Other specified disorders of the skin and subcutaneous tissue related to radiation: Secondary | ICD-10-CM | POA: Diagnosis not present

## 2017-05-31 NOTE — Progress Notes (Signed)
[name], [name] ([id number]) Visit Report for 05/30/2017 Arrival Information Details Patient Name: [name], [name] Date of Service: 05/30/2017 8:00 AM Medical Record Number: [id number] Patient Account Number: [id number] Date of Birth/Sex: [birth year] (41 y.o. Male) Treating RN: Primary Care Jenny Lai: Donavan Burnet Other Clinician: Jacqulyn Bath Referring Kalianna Verbeke: Donavan Burnet Treating Hilmer Aliberti/Extender: [name], [name]: 1 Visit Information History Since Last Visit Added or deleted any medications: No Patient Arrived: Ambulatory Any new allergies or adverse reactions: No Arrival Time: 07:47 Had a fall or experienced change in No Accompanied By: self activities of daily living that may affect Transfer Assistance: None risk of falls: Patient Identification Verified: Yes Signs or symptoms of abuse/neglect since last No Secondary Verification Process Yes visito Completed: Hospitalized since last visit: No Patient Requires Transmission-Based No Pain Present Now: No Precautions: Patient Has Alerts: No Electronic Signature(s) Signed: 05/30/2017 11:00:50 AM By: Lorine Bears RCP, RRT, CHT Entered By: Lorine Bears on 05/30/2017 08:08:10 [name] ([id number]) -------------------------------------------------------------------------------- Encounter Discharge Information Details Patient Name: [name] Date of Service: 05/30/2017 8:00 AM Medical Record Number: [id number] Patient Account Number: [id number] Date of Birth/Sex: June 29, 1976 (41 y.o. Male) Treating RN: Primary Care Lajuan Godbee: Donavan Burnet Other Clinician: Jacqulyn Bath Referring Ludivina Guymon: Donavan Burnet Treating Jihan Rudy/Extender: [name], [name]: 1 Encounter Discharge Information Items Discharge Pain Level: 0 Discharge Condition: Stable Ambulatory Status: Ambulatory Other (Note Discharge Destination: Required) Transportation: Private  Auto Accompanied By: self Schedule Follow-up Appointment: No Medication Reconciliation completed and provided to Patient/Care No Mada Sadik: Clinical Summary of Care: Notes Patient will go to work from here. Patient has an HBO treatment scheduled on 05/31/17 at 08:00 am. Electronic Signature(s) Signed: 05/30/2017 11:00:50 AM By: Lorine Bears RCP, RRT, CHT Entered By: Lorine Bears on 05/30/2017 10:57:36 [name] ([id number]) -------------------------------------------------------------------------------- Vitals Details Patient Name: [name] Date of Service: 05/30/2017 8:00 AM Medical Record Number: [id number] Patient Account Number: [id number] Date of Birth/Sex: 1976/05/13 (41 y.o. Male) Treating RN: Primary Care Terris Germano: Donavan Burnet Other Clinician: Jacqulyn Bath Referring Orell Hurtado: Donavan Burnet Treating Doristine Shehan/Extender: [name], [name]: 1 Vital Signs Time Taken: 07:50 Temperature (F): 98.2 Height (in): 70 Pulse (bpm): 84 Weight (lbs): 145 Respiratory Rate (breaths/min): 16 Body Mass Index (BMI): 20.8 Blood Pressure (mmHg): 106/66 Reference Range: 80 - 120 mg / dl Electronic Signature(s) Signed: 05/30/2017 11:00:50 AM By: Lorine Bears RCP, RRT, CHT Entered By: Lorine Bears on 05/30/2017 08:08:50

## 2017-05-31 NOTE — Progress Notes (Signed)
[name], [name] ([id number]) Visit Report for 05/29/2017 HBO Details Patient Name: [name], [name] Date of Service: 05/29/2017 8:00 AM Medical Record Number: [id number] Patient Account Number: [id number] Date of Birth/Sex: 19-Feb-1976 (41 y.o. 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 HBO Treatment Details Treatment Number: 7 Patient Type: Outpatient Chamber Type: Monoplace Chamber Serial #: [id number] Treatment Protocol: 2.0 ATA with 90 minutes oxygen, and no air breaks Treatment Details Compression Rate Down: 1.0 psi / minute De-Compression Rate Up: 1.5 psi / minute Air breaks and breathing Compress Tx Pressure periods Decompress Decompress Begins Reached (leave unused spaces Begins Ends blank) Chamber Pressure (ATA) 1 2 - - - - - - 2 1 Clock Time (24 hr) 08:07 08:22 - - - - - - 09:52 10:02 Treatment Length: 115 (minutes) Treatment Segments: 4 Capillary Blood Glucose Pre Capillary Blood Glucose (mg/dl): Post Capillary Blood Glucose (mg/dl): Vital Signs Capillary Blood Glucose Reference Range: 80 - 120 mg / dl HBO Diabetic Blood Glucose Intervention Range: <131 mg/dl or >249 mg/dl Time Vitals Blood Respiratory Capillary Blood Glucose Pulse Action Type: Pulse: Temperature: Taken: Pressure: Rate: Glucose (mg/dl): Meter #: Oximetry (%) Taken: Pre 08:00 102/64 66 16 98.4 Post 10:05 102/64 66 16 97.7 Treatment Response [name], [name] ([id number]) Treatment Well Toleration: Treatment Treatment Completed without Adverse Event Completion Status: Dresden Lozito Notes Patient continues to tolerate HBO therapy without  complication. No adverse evetns. HBO Attestation I certify that I supervised this HBO treatment in accordance with Medicare guidelines. A trained Yes emergency response team is readily available per hospital policies and procedures. Continue HBOT as ordered. Yes Electronic Signature(s) Signed: 05/30/2017 1:25:22 AM By: Worthy Keeler PA-C Previous Signature: 05/29/2017 10:58:02 AM Version By: Lorine Bears RCP, RRT, CHT Entered By: Worthy Keeler on 05/29/2017 11:48:22 [name] ([id number]) -------------------------------------------------------------------------------- HBO Safety Checklist Details Patient Name: [name] Date of Service: 05/29/2017 8:00 AM Medical Record Number: [id number] Patient Account Number: [id number] Date of Birth/Sex: 09-03-1976 (41 y.o. Male) Treating RN: Primary Care Chao Blazejewski: Donavan Burnet Other Clinician: Jacqulyn Bath Referring Eliaz Fout: Donavan Burnet Treating Jye Fariss/Extender: Worthy Keeler Weeks in Treatment: 1 HBO Safety Checklist Items Safety Checklist Consent Form Signed Patient voided / foley secured and emptied When did you last eato 05/28/17 pm Last dose of injectable or oral agent n/a NA Ostomy pouch emptied and vented if applicable NA All implantable devices assessed, documented and approved NA Intravenous access site secured and place Valuables secured Linens and cotton and cotton/polyester blend (less than 51% polyester) Personal oil-based products / skin lotions / body lotions removed NA Wigs or hairpieces removed NA Smoking or tobacco materials removed Books / newspapers / magazines / loose paper removed Cologne, aftershave, perfume and deodorant removed Jewelry removed (may wrap wedding band) NA Make-up removed Hair care products removed Battery operated devices (external) removed Heating patches and chemical warmers removed NA Titanium eyewear removed NA Nail polish cured greater than 10  hours NA Casting material cured greater than 10 hours NA Hearing aids removed NA Loose dentures or partials removed NA Prosthetics have been removed Patient demonstrates correct use of air break device (if applicable) Patient concerns have been addressed Patient grounding bracelet on and cord attached to chamber Specifics for Inpatients (complete in  addition to above) Medication sheet sent with patient Intravenous medications needed or due during therapy sent with patient VONNIE, LIGMAN ([id number]) Drainage tubes (e.g. nasogastric tube or chest tube secured and vented) Endotracheal or Tracheotomy tube secured Cuff deflated of air and inflated with saline Airway suctioned Electronic Signature(s) Signed: 05/29/2017 10:58:02 AM By: Lorine Bears RCP, RRT, CHT Entered By: Becky Sax, Amado Nash on 05/29/2017 08:28:40

## 2017-05-31 NOTE — Progress Notes (Signed)
[name], [name] ([id number]) Visit Report for 05/29/2017 Problem List Details Patient Name: [name], [name] Date of Service: 05/29/2017 8:00 AM Medical Record Number: [id number] Patient Account Number: [id number] Date of Birth/Sex: 03-21-1976 (41 y.o. 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 G04.89 Other myelitis Facility Procedures CPT4 Code: 03888280 Description: (Facility Use Only) HBOT, full body chamber, 63min Modifier: Quantity: 4 Physician Procedures CPT4: Description Modifier Quantity Code 0349179 15056 - WC PHYS HYPERBARIC OXYGEN THERAPY 1 ICD-10 Description Diagnosis 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

## 2017-06-01 ENCOUNTER — Encounter: Payer: Managed Care, Other (non HMO) | Admitting: Physician Assistant

## 2017-06-01 ENCOUNTER — Ambulatory Visit: Admission: RE | Admit: 2017-06-01 | Payer: Managed Care, Other (non HMO) | Source: Ambulatory Visit

## 2017-06-01 DIAGNOSIS — L598 Other specified disorders of the skin and subcutaneous tissue related to radiation: Secondary | ICD-10-CM | POA: Diagnosis not present

## 2017-06-01 NOTE — Progress Notes (Signed)
[name], [name] ([id number]) Visit Report for 05/30/2017 Problem List Details Patient Name: [name], [name] Date of Service: 05/30/2017 8:00 AM Medical Record Number: [id number] Patient Account Number: [id number] Date of Birth/Sex: 14-Oct-1976 (41 y.o. 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 G04.89 Other myelitis Facility Procedures CPT4 Code: [id number] Description: (Facility Use Only) HBOT, full body chamber, 76min Modifier: Quantity: 4 Physician Procedures CPT4: Description Modifier Quantity Code 5436067 70340 - WC PHYS HYPERBARIC OXYGEN THERAPY 1 ICD-10 Description Diagnosis 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

## 2017-06-01 NOTE — Progress Notes (Signed)
[name] ([id number]) Visit Report for 05/31/2017 HBO Details Patient Name: [name], [name] Date of Service: 05/31/2017 8:00 AM Medical Record Number: [id number] Patient Account Number: [id number] Date of Birth/Sex: [birth year] (41 y.o. 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 HBO Treatment Details Treatment Number: 9 Patient Type: Outpatient Chamber Type: Monoplace Chamber Serial #: [id number] Treatment Protocol: 2.0 ATA with 90 minutes oxygen, and no air breaks Treatment Details Compression Rate Down: 1.5 psi / minute De-Compression Rate Up: 1.5 psi / minute Air breaks and breathing Compress Tx Pressure periods Decompress Decompress Begins Reached (leave unused spaces Begins Ends blank) Chamber Pressure (ATA) 1 2 - - - - - - 2 1 Clock Time (24 hr) 08:04 08:16 - - - - - - 09:46 09:57 Treatment Length: 113 (minutes) Treatment Segments: 4 Capillary Blood Glucose Pre Capillary Blood Glucose (mg/dl): Post Capillary Blood Glucose (mg/dl): Vital Signs Capillary Blood Glucose Reference Range: 80 - 120 mg / dl HBO Diabetic Blood Glucose Intervention Range: <131 mg/dl or >249 mg/dl Time Vitals Blood Respiratory Capillary Blood Glucose Pulse Action Type: Pulse: Temperature: Taken: Pressure: Rate: Glucose (mg/dl): Meter #: Oximetry (%) Taken: Pre 08:00 102/70 72 16 97.8 Post 10:05 108/72 60 16 97.5 Treatment Response DELAWRENCE, FRIDMAN ([id number]) Treatment Well Toleration: Treatment Treatment Completed without Adverse Event Completion Status: HBO Attestation I certify that I supervised this HBO treatment  in accordance with Medicare guidelines. A trained Yes emergency response team is readily available per hospital policies and procedures. Continue HBOT as ordered. Yes Electronic Signature(s) Signed: 05/31/2017 12:41:32 PM By: Worthy Keeler PA-C Previous Signature: 05/31/2017 10:44:57 AM Version By: Lorine Bears RCP, RRT, CHT Previous Signature: 05/31/2017 10:43:08 AM Version By: Lorine Bears RCP, RRT, CHT Entered By: Worthy Keeler on 05/31/2017 12:40:15 [name] ([id number]) -------------------------------------------------------------------------------- HBO Safety Checklist Details Patient Name: [name] Date of Service: 05/31/2017 8:00 AM Medical Record Number: [id number] Patient Account Number: [id number] Date of Birth/Sex: August 22, 1976 (41 y.o. Male) Treating RN: Primary Care Loraine Freid: Donavan Burnet Other Clinician: Jacqulyn Bath Referring Celinda Dethlefs: Donavan Burnet Treating Kasha Howeth/Extender: Worthy Keeler Weeks in Treatment: 2 HBO Safety Checklist Items Safety Checklist Consent Form Signed Patient voided / foley secured and emptied When did you last eato 05/30/17 pm Last dose of injectable or oral agent n/a NA Ostomy pouch emptied and vented if applicable NA All implantable devices assessed, documented and approved NA Intravenous access site secured and place Valuables secured Linens and cotton and cotton/polyester blend (less than 51% polyester) Personal oil-based products / skin lotions / body lotions removed NA Wigs or hairpieces removed NA Smoking or tobacco materials removed Books / newspapers / magazines / loose paper removed Cologne, aftershave, perfume and deodorant removed Jewelry removed (may wrap wedding band) NA Make-up removed Hair care products removed Battery operated devices (external) removed Heating patches and chemical warmers removed NA Titanium eyewear removed NA Nail polish cured greater than 10  hours NA Casting material cured greater than 10 hours NA Hearing aids removed NA Loose dentures or partials removed NA Prosthetics have been removed Patient demonstrates correct use of air break device (if applicable) Patient concerns have been addressed Patient grounding bracelet on and cord attached to chamber Specifics for Inpatients (complete in  addition to above) Medication sheet sent with patient Intravenous medications needed or due during therapy sent with patient DANTHONY, KENDRIX ([id number]) Drainage tubes (e.g. nasogastric tube or chest tube secured and vented) Endotracheal or Tracheotomy tube secured Cuff deflated of air and inflated with saline Airway suctioned Electronic Signature(s) Signed: 05/31/2017 10:43:08 AM By: Lorine Bears RCP, RRT, CHT Entered By: Lorine Bears on 05/31/2017 08:19:43

## 2017-06-01 NOTE — Progress Notes (Signed)
[name], [name] ([id number]) Visit Report for 05/30/2017 HBO Details Patient Name: [name], [name] Date of Service: 05/30/2017 8:00 AM Medical Record Number: [id number] Patient Account Number: [id number] Date of Birth/Sex: 04-11-1976 (41 y.o. 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 HBO Treatment Details Treatment Number: 8 Patient Type: Outpatient Chamber Type: Monoplace Chamber Serial #: [id number] Treatment Protocol: 2.0 ATA with 90 minutes oxygen, and no air breaks Treatment Details Compression Rate Down: 1.0 psi / minute De-Compression Rate Up: 1.5 psi / minute Air breaks and breathing Compress Tx Pressure periods Decompress Decompress Begins Reached (leave unused spaces Begins Ends blank) Chamber Pressure (ATA) 1 2 - - - - - - 2 1 Clock Time (24 hr) 08:04 08:18 - - - - - - 09:48 09:58 Treatment Length: 114 (minutes) Treatment Segments: 4 Capillary Blood Glucose Pre Capillary Blood Glucose (mg/dl): Post Capillary Blood Glucose (mg/dl): Vital Signs Capillary Blood Glucose Reference Range: 80 - 120 mg / dl HBO Diabetic Blood Glucose Intervention Range: <131 mg/dl or >249 mg/dl Time Vitals Blood Respiratory Capillary Blood Glucose Pulse Action Type: Pulse: Temperature: Taken: Pressure: Rate: Glucose (mg/dl): Meter #: Oximetry (%) Taken: Pre 07:50 106/66 84 16 98.2 Post 10:00 102/68 72 16 97.7 Treatment Response DILLON, LIVERMORE ([id number]) Treatment Well Toleration: Treatment Treatment Completed without Adverse Event Completion Status: HBO Attestation I certify that I supervised this HBO treatment  in accordance with Medicare guidelines. A trained Yes emergency response team is readily available per hospital policies and procedures. Continue HBOT as ordered. Yes Electronic Signature(s) Signed: 05/31/2017 12:37:32 AM By: Worthy Keeler PA-C Previous Signature: 05/30/2017 11:00:50 AM Version By: Lorine Bears RCP, RRT, CHT Entered By: Worthy Keeler on 05/31/2017 00:32:51 [name] ([id number]) -------------------------------------------------------------------------------- HBO Safety Checklist Details Patient Name: [name] Date of Service: 05/30/2017 8:00 AM Medical Record Number: [id number] Patient Account Number: [id number] Date of Birth/Sex: September 29, 1976 (41 y.o. Male) Treating RN: Primary Care Rigdon Macomber: Donavan Burnet Other Clinician: Jacqulyn Bath Referring Devontae Casasola: Donavan Burnet Treating Tryson Lumley/Extender: Worthy Keeler Weeks in Treatment: 1 HBO Safety Checklist Items Safety Checklist Consent Form Signed Patient voided / foley secured and emptied When did you last eato 05/29/17 Last dose of injectable or oral agent n/a NA Ostomy pouch emptied and vented if applicable NA All implantable devices assessed, documented and approved NA Intravenous access site secured and place Valuables secured Linens and cotton and cotton/polyester blend (less than 51% polyester) Personal oil-based products / skin lotions / body lotions removed NA Wigs or hairpieces removed NA Smoking or tobacco materials removed Books / newspapers / magazines / loose paper removed Cologne, aftershave, perfume and deodorant removed Jewelry removed (may wrap wedding band) NA Make-up removed Hair care products removed Battery operated devices (external) removed Heating patches and chemical warmers removed NA Titanium eyewear removed NA Nail polish cured greater than 10 hours NA Casting material cured greater than 10 hours NA Hearing aids removed NA Loose dentures or  partials removed NA Prosthetics have been removed Patient demonstrates correct use of air break device (if applicable) Patient concerns have been addressed Patient grounding bracelet on and cord attached to chamber Specifics for Inpatients (complete in addition to above) Medication sheet sent with patient Intravenous medications needed or due during  therapy sent with patient TARRIN, LEBOW ([id number]) Drainage tubes (e.g. nasogastric tube or chest tube secured and vented) Endotracheal or Tracheotomy tube secured Cuff deflated of air and inflated with saline Airway suctioned Electronic Signature(s) Signed: 05/30/2017 11:00:50 AM By: Lorine Bears RCP, RRT, CHT Entered By: Becky Sax, Amado Nash on 05/30/2017 08:09:49

## 2017-06-01 NOTE — Progress Notes (Signed)
DELANE, WESSINGER ([id number]) Visit Report for 05/31/2017 Problem List Details Patient Name: [name], [name] Date of Service: 05/31/2017 8:00 AM Medical Record Number: [id number] Patient Account Number: [id number] Date of Birth/Sex: 02-Apr-1976 (42 y.o. 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 G04.89 Other myelitis Facility Procedures CPT4 Code: [id number] Description: (Facility Use Only) HBOT, full body chamber, 68min Modifier: Quantity: 4 Physician Procedures CPT4: Description Modifier Quantity Code 4709628 36629 - WC PHYS HYPERBARIC OXYGEN THERAPY 1 ICD-10 Description Diagnosis 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

## 2017-06-02 NOTE — Progress Notes (Signed)
NYZIR, DUBOIS ([id number]) Visit Report for 06/01/2017 Arrival Information Details Patient Name: [name], [name] Date of Service: 06/01/2017 8:00 AM Medical Record Number: [id number] Patient Account Number: [id number] Date of Birth/Sex: 1976/09/23 (41 y.o. Male) Treating RN: Primary Care Montrelle Eddings: Donavan Burnet Other Clinician: Jacqulyn Bath Referring Tykiera Raven: Donavan Burnet Treating Shaniya Tashiro/Extender: Worthy Keeler Weeks in Treatment: 2 Visit Information History Since Last Visit Added or deleted any medications: No Patient Arrived: Ambulatory Any new allergies or adverse reactions: No Arrival Time: 07:50 Had a fall or experienced change in No Accompanied By: self activities of daily living that may affect Transfer Assistance: None risk of falls: Patient Identification Verified: Yes Signs or symptoms of abuse/neglect since last No Secondary Verification Process Yes visito Completed: Hospitalized since last visit: No Patient Requires Transmission-Based No Pain Present Now: No Precautions: Patient Has Alerts: No Electronic Signature(s) Signed: 06/01/2017 10:37:43 AM By: Lorine Bears RCP, RRT, CHT Entered By: Lorine Bears on 06/01/2017 08:23:54 [name] ([id number]) -------------------------------------------------------------------------------- Encounter Discharge Information Details Patient Name: [name] Date of Service: 06/01/2017 8:00 AM Medical Record Number: [id number] Patient Account Number: [id number] Date of Birth/Sex: Feb 26, 1976 (41 y.o. Male) Treating RN: Primary Care Rmani Kellogg: Donavan Burnet Other Clinician: Jacqulyn Bath Referring Stiles Maxcy: Donavan Burnet Treating Jamani Eley/Extender: [name], [name]: 2 Encounter Discharge Information Items Discharge Pain Level: 0 Discharge Condition: Stable Ambulatory Status: Ambulatory Other (Note Discharge Destination: Required) Transportation: Private  Auto Accompanied By: self Schedule Follow-up Appointment: No Medication Reconciliation completed and provided to Patient/Care No Ahmar Pickrell: Clinical Summary of Care: Notes Patient will go to work from here. Patient has an HBO treatment scheduled on 06/04/17 at 08:00 am. Electronic Signature(s) Signed: 06/01/2017 10:37:43 AM By: Lorine Bears RCP, RRT, CHT Entered By: Lorine Bears on 06/01/2017 10:33:46 [name] ([id number]) -------------------------------------------------------------------------------- [place] Details Patient Name: [name] Date of Service: 06/01/2017 8:00 AM Medical Record Number: [id number] Patient Account Number: [id number] Date of Birth/Sex: July 19, 1976 (41 y.o. Male) Treating RN: Primary Care Natacia Chaisson: Donavan Burnet Other Clinician: Jacqulyn Bath Referring Bernita Beckstrom: Donavan Burnet Treating Samyrah Bruster/Extender: [name], [name]: 2 Vital Signs Time Taken: 08:00 Temperature (F): 98.4 Height (in): 70 Pulse (bpm): 84 Weight (lbs): 145 Respiratory Rate (breaths/min): 16 Body Mass Index (BMI): 20.8 Blood Pressure (mmHg): 100/64 Reference Range: 80 - 120 mg / dl Electronic Signature(s) Signed: 06/01/2017 10:37:43 AM By: Lorine Bears RCP, RRT, CHT Entered By: Lorine Bears on 06/01/2017 08:25:06

## 2017-06-04 ENCOUNTER — Encounter: Payer: Managed Care, Other (non HMO) | Admitting: Surgery

## 2017-06-04 ENCOUNTER — Telehealth: Payer: Self-pay | Admitting: Radiation Oncology

## 2017-06-04 ENCOUNTER — Other Ambulatory Visit: Payer: Self-pay | Admitting: Radiation Oncology

## 2017-06-04 DIAGNOSIS — R4589 Other symptoms and signs involving emotional state: Secondary | ICD-10-CM

## 2017-06-04 DIAGNOSIS — R221 Localized swelling, mass and lump, neck: Secondary | ICD-10-CM

## 2017-06-04 DIAGNOSIS — R4189 Other symptoms and signs involving cognitive functions and awareness: Secondary | ICD-10-CM

## 2017-06-04 DIAGNOSIS — C090 Malignant neoplasm of tonsillar fossa: Secondary | ICD-10-CM

## 2017-06-04 DIAGNOSIS — F329 Major depressive disorder, single episode, unspecified: Principal | ICD-10-CM

## 2017-06-04 DIAGNOSIS — L598 Other specified disorders of the skin and subcutaneous tissue related to radiation: Secondary | ICD-10-CM | POA: Diagnosis not present

## 2017-06-04 MED ORDER — CITALOPRAM HYDROBROMIDE 20 MG PO TABS: 20.0000 mg | ORAL_TABLET | Freq: Every day | ORAL | 5 refills | Status: DC

## 2017-06-04 NOTE — Progress Notes (Signed)
EGBERT, SEIDEL ([id number]) Visit Report for 05/31/2017 Arrival Information Details Patient Name: [name] Date of Service: 05/31/2017 8:00 AM Medical Record Number: [id number] Patient Account Number: [id number] Date of Birth/Sex: [birth year] (41 y.o. Male) Treating RN: Primary Care Ezreal Turay: Donavan Burnet Other Clinician: Jacqulyn Bath Referring Drisana Schweickert: Donavan Burnet Treating [employer] Null/Extender: Worthy Keeler Weeks in Treatment: 2 Visit Information History Since Last Visit Added or deleted any medications: No Patient Arrived: Ambulatory Any new allergies or adverse reactions: No Arrival Time: 07:55 Had a fall or experienced change in No Accompanied By: self activities of daily living that may affect Transfer Assistance: None risk of falls: Patient Identification Verified: Yes Signs or symptoms of abuse/neglect since last No Secondary Verification Process Yes visito Completed: Hospitalized since last visit: No Patient Requires Transmission-Based No Pain Present Now: No Precautions: Patient Has Alerts: No Electronic Signature(s) Signed: 05/31/2017 10:43:08 AM By: Lorine Bears RCP, RRT, CHT Entered By: Lorine Bears on 05/31/2017 08:18:01 [name] ([id number]) -------------------------------------------------------------------------------- Encounter Discharge Information Details Patient Name: [name] Date of Service: 05/31/2017 8:00 AM Medical Record Number: [id number] Patient Account Number: [id number] Date of Birth/Sex: [birth year] (41 y.o. Male) Treating RN: Primary Care Merrilyn Legler: Donavan Burnet Other Clinician: Jacqulyn Bath Referring Donya Tomaro: Donavan Burnet Treating Genea Rheaume/Extender: [name], [name]: 2 Encounter Discharge Information Items Discharge Pain Level: 0 Discharge Condition: Stable Ambulatory Status: Ambulatory Other (Note Discharge Destination: Required) Transportation: Private  Auto Accompanied By: self Schedule Follow-up Appointment: No Medication Reconciliation completed and provided to Patient/Care No Dajai Wahlert: Clinical Summary of Care: Notes Patient will go to work from here. Patient has an HBO treatment scheduled on 06/01/17 at 08:00 am. Electronic Signature(s) Signed: 05/31/2017 10:43:08 AM By: Lorine Bears RCP, RRT, CHT Entered By: Lorine Bears on 05/31/2017 10:39:12 [name] ([id number]) -------------------------------------------------------------------------------- Vitals Details Patient Name: [name] Date of Service: 05/31/2017 8:00 AM Medical Record Number: [id number] Patient Account Number: [id number] Date of Birth/Sex: 1975-11-27 (41 y.o. Male) Treating RN: Primary Care Stina Gane: Donavan Burnet Other Clinician: Jacqulyn Bath Referring Kenyon Eichelberger: Donavan Burnet Treating Ashyia Schraeder/Extender: [name], [name]: 2 Vital Signs Time Taken: 08:00 Temperature (F): 97.8 Height (in): 70 Pulse (bpm): 72 Weight (lbs): 145 Respiratory Rate (breaths/min): 16 Body Mass Index (BMI): 20.8 Blood Pressure (mmHg): 102/70 Reference Range: 80 - 120 mg / dl Electronic Signature(s) Signed: 05/31/2017 10:43:08 AM By: Lorine Bears RCP, RRT, CHT Entered By: Lorine Bears on 05/31/2017 08:18:40

## 2017-06-04 NOTE — Progress Notes (Signed)
[name], [name] ([id number]) Visit Report for 05/21/2017 Physician Orders Details Patient Name: [name], [name] Date of Service: 05/21/2017 8:15 AM Medical Record Number: [id number] Patient Account Number: [id number] Date of Birth/Sex: August 05, 1976 (41 y.o. Male) Treating RN: Cornell Barman Primary Care Provider: Donavan Burnet Other Clinician: Referring Provider: Donavan Burnet Treating Provider/Extender: Frann Rider in Treatment: 0 Verbal / Phone Orders: No Diagnosis Coding Hyperbaric Oxygen Therapy o Indication: - STRN of left shoulder o If appropriate for treatment, begin HBOT per protocol: o 2.0 ATA for 90 Minutes without Air Breaks o One treatment per day (delivered Monday through Friday unless otherwise specified in Special Instructions below): o Total # of Treatments: - 40 Electronic Signature(s) Signed: 05/21/2017 4:13:08 PM By: Worthy Keeler PA-C Signed: 06/04/2017 8:00:17 AM By: Christin Fudge MD, FACS Entered By: Worthy Keeler on 05/21/2017 12:49:48 [name] ([id number]) -------------------------------------------------------------------------------- ROS/PFSH Details Patient Name: [name] Date of Service: 05/21/2017 8:15 AM Medical Record Number: [id number] Patient Account Number: [id number] Date of Birth/Sex: Sep 14, 1976 (41 y.o. Male) Treating RN: Cornell Barman Primary Care Provider: Donavan Burnet Other Clinician: Referring Provider: Donavan Burnet Treating Provider/Extender: STONE III, [name]: 0 Information Obtained From Chart Wound History Have you had any tests for circulation on your legso No Constitutional Symptoms (General Health) Complaints and Symptoms: No Complaints or Symptoms Medical History: Past Medical History Notes: Cancer of [place], Mitral Valve Prolapse, headaches Eyes Complaints and Symptoms: No Complaints or Symptoms Medical History: Negative for: Cataracts; Glaucoma; Optic  Neuritis Ear/Nose/Mouth/Throat Complaints and Symptoms: No Complaints or Symptoms Medical History: Negative for: Chronic sinus problems/congestion; Middle ear problems Hematologic/Lymphatic Complaints and Symptoms: No Complaints or Symptoms Medical History: Negative for: Anemia; Hemophilia; Human Immunodeficiency Virus; Lymphedema; Sickle Cell Disease Respiratory Complaints and Symptoms: No Complaints or Symptoms [name], [name] ([id number]) Medical History: Negative for: Aspiration; Asthma; Chronic Obstructive Pulmonary Disease (COPD); Pneumothorax; Sleep Apnea; Tuberculosis Cardiovascular Complaints and Symptoms: No Complaints or Symptoms Medical History: Negative for: Angina; Arrhythmia; Congestive Heart Failure; Coronary Artery Disease; Deep Vein Thrombosis; Hypertension; Hypotension; Myocardial Infarction; Peripheral Arterial Disease; Peripheral Venous Disease; Phlebitis; Vasculitis Gastrointestinal Complaints and Symptoms: No Complaints or Symptoms Medical History: Negative for: Cirrhosis ; Colitis; Crohnos; Hepatitis A; Hepatitis B; Hepatitis C Endocrine Complaints and Symptoms: No Complaints or Symptoms Medical History: Negative for: Type I Diabetes; Type II Diabetes Genitourinary Complaints and Symptoms: No Complaints or Symptoms Medical History: Negative for: End Stage Renal Disease Immunological Complaints and Symptoms: No Complaints or Symptoms Medical History: Negative for: Lupus Erythematosus; Raynaudos; Scleroderma Integumentary (Skin) Medical History: Negative for: History of Burn; History of pressure wounds Musculoskeletal [name], [name] ([id number]) Complaints and Symptoms: No Complaints or Symptoms Medical History: Negative for: Gout; Rheumatoid Arthritis; Osteoarthritis; Osteomyelitis Neurologic Complaints and Symptoms: No Complaints or Symptoms Medical History: Negative for: Dementia; Neuropathy; Quadriplegia; Paraplegia; Seizure  Disorder Past Medical History Notes: headaches Oncologic Complaints and Symptoms: Review of System Notes: Cancer of [place] Medical History: Positive for: Received Radiation - 36 treatments 2017 Negative for: Received Chemotherapy Psychiatric Complaints and Symptoms: No Complaints or Symptoms Medical History: Negative for: Anorexia/bulimia; Confinement Anxiety Immunizations Pneumococcal Vaccine: Received Pneumococcal Vaccination: No Family and Social History Cancer: Yes - Father; Diabetes: No; Heart Disease: No; Hereditary Spherocytosis: No; Hypertension: No; Kidney Disease: No; Lung Disease: Yes - Father; Seizures: No; Stroke: No; Thyroid Problems: No; Tuberculosis: Yes - Maternal Grandparents; Never smoker; Marital Status - Married; Alcohol Use: Daily; Drug Use: No History; Caffeine Use: Rarely; Financial Concerns: No; Food, Clothing or Shelter Needs: No; Support System Lacking: No;  Transportation Concerns: No Electronic Signature(s) Signed: 05/21/2017 4:13:08 PM By: Worthy Keeler PA-C Signed: 05/21/2017 5:47:00 PM By: Gretta Cool, BSN, RN, CWS, Kim RN, BSN Entered By: Gretta Cool, BSN, RN, CWS, Kim on 05/21/2017 10:03:47

## 2017-06-04 NOTE — Progress Notes (Signed)
KNIGHT, OELKERS ([id number]) Visit Report for 06/04/2017 HBO Details Patient Name: [name], [name] Date of Service: 06/04/2017 8:00 AM Medical Record Number: [id number] Patient Account Number: [id number] Date of Birth/Sex: 09-30-1976 (41 y.o. 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 HBO Treatment Details Treatment Number: 11 Patient Type: Outpatient Chamber Type: Monoplace Chamber Serial #: [id number] Treatment Protocol: 2.0 ATA with 90 minutes oxygen, and no air breaks Treatment Details Compression Rate Down: 1.5 psi / minute De-Compression Rate Up: 1.5 psi / minute Air breaks and breathing Compress Tx Pressure periods Decompress Decompress Begins Reached (leave unused spaces Begins Ends blank) Chamber Pressure (ATA) 1 2 - - - - - - 2 1 Clock Time (24 hr) 08:01 08:13 - - - - - - 09:43 09:53 Treatment Length: 112 (minutes) Treatment Segments: 4 Capillary Blood Glucose Pre Capillary Blood Glucose (mg/dl): Post Capillary Blood Glucose (mg/dl): Vital Signs Capillary Blood Glucose Reference Range: 80 - 120 mg / dl HBO Diabetic Blood Glucose Intervention Range: <131 mg/dl or >249 mg/dl Time Vitals Blood Respiratory Capillary Blood Glucose Pulse Action Type: Pulse: Temperature: Taken: Pressure: Rate: Glucose (mg/dl): Meter #: Oximetry (%) Taken: Pre 07:55 102/70 84 16 98.1 Post 09:55 104/64 60 16 97.9 Treatment Response CHAIS, FEHRINGER ([id number]) Treatment Completion Status: Treatment Completed without Adverse Event Aldonia Keeven Notes the patient and I had a discussion today, after I recently had a call from the  radiation oncologist Dr. Eppie Gibson. She has updated me with the fact, that the patient has a level II lymph node on the left neck which needs surgical removal and possible a radical neck dissection. this is to be done in the near future and in view of this she and I discussed that we would put the patient's hyperbaric oxygen treatment on hold as of today. 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 Patient is agreeable with this treatment plan. HBO Attestation I certify that I supervised this HBO treatment in accordance with Medicare guidelines. A trained Yes emergency response team is readily available per hospital policies and procedures. Continue HBOT as ordered. Yes Electronic Signature(s) Signed: 06/04/2017 3:16:27 PM By: Christin Fudge MD, FACS Previous Signature: 06/04/2017 10:23:24 AM Version By: Lorine Bears RCP, RRT, CHT Entered By: Christin Fudge on 06/04/2017 10:29:29 [name] ([id number]) -------------------------------------------------------------------------------- HBO Safety Checklist Details Patient Name: [name] Date of Service: 06/04/2017 8:00 AM Medical Record Number: [id number] Patient Account Number: [id number] Date of Birth/Sex: 26-Dec-1975 (41 y.o. Male) Treating RN: Primary Care Shai Rasmussen: Donavan Burnet Other Clinician: Jacqulyn Bath Referring Treyshawn Muldrew: Donavan Burnet Treating Endrit Gittins/Extender: Frann Rider in Treatment: 2 HBO Safety Checklist Items Safety Checklist Consent Form Signed Patient voided / foley secured and emptied When did you last eato 06/03/17 pm Last dose of injectable or oral agent n/a NA Ostomy pouch emptied and vented if applicable NA All implantable devices assessed, documented and approved NA Intravenous access site secured and  place Valuables secured Linens and cotton and cotton/polyester blend (less than 51% polyester) Personal oil-based products / skin lotions / body lotions removed NA Wigs or hairpieces  removed NA Smoking or tobacco materials removed Books / newspapers / magazines / loose paper removed Cologne, aftershave, perfume and deodorant removed Jewelry removed (may wrap wedding band) NA Make-up removed Hair care products removed Battery operated devices (external) removed Heating patches and chemical warmers removed NA Titanium eyewear removed NA Nail polish cured greater than 10 hours NA Casting material cured greater than 10 hours NA Hearing aids removed NA Loose dentures or partials removed NA Prosthetics have been removed Patient demonstrates correct use of air break device (if applicable) Patient concerns have been addressed Patient grounding bracelet on and cord attached to chamber Specifics for Inpatients (complete in addition to above) Medication sheet sent with patient Intravenous medications needed or due during therapy sent with patient DAUNDRE, BIEL ([id number]) Drainage tubes (e.g. nasogastric tube or chest tube secured and vented) Endotracheal or Tracheotomy tube secured Cuff deflated of air and inflated with saline Airway suctioned Electronic Signature(s) Signed: 06/04/2017 10:23:24 AM By: Lorine Bears RCP, RRT, CHT Entered By: Becky Sax, Amado Nash on 06/04/2017 08:22:54

## 2017-06-04 NOTE — Telephone Encounter (Signed)
Today, I was able to touch base with the following MDs and the patient and his wife after reviewing the MRI brachial plexus and CT neck reports from [facility]:  1) Discussed the patient with Dr. Phoebe [name] of rad/onc, [facility].  Dr. 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  My physics team and I have reviewed his RT plan in great detail and it appears to have met our safety standards; it also appears to have been delivered with appropriate daily set-up per CT imaging review as well.  I have yet to review the new diagnostic [facility] images, but the MRI is concerning for Left brachial plexopathy.  CT neck concerning for left nodal recurrence, locally.  I requested my staff to have the images digitized into our system. Dr. Phoebe [name] and I agree that it's prudent to hold HBO until a tissue diagnosis can be obtained from the concerning left neck mass that was not present on the patient's restaging PET earlier this year.  2) Discussed the above in detail with Dr. Con [name]. He will hold HBO until biopsy results are known.   3) Discussed the above with Dr. Mickeal [name].  Reviewed lab results with him, which do not explain the patient's symptoms.  4) Discussed the above in detail with Dr. Wilburn [name], ENT.  He recommended US guided biopsy as an initial step, rather than excisional biopsy, as he's concerned about the risks of such.  Will start with left neck US guided bx.  This was scheduled for 8-23.  5) Discussed the above in detail with [name]. He is agreeable to the plan.  He expressed little free time, barely managing his schedule.  He is not comfortable with seeing his PCP due to lack of time and lack of a relationship with PCP.  While he understands that would be my preference,  he is having difficulty coping with numerous appts.  We also discussed a rheumatology consult to rule out autoimmune conditions which could be complicating his post-RT course, but he chooses to defer that for now.    We discussed his symptoms of depression.  He declines my recommendations of seeing his PCP or a therapist or psychiatrist at this time for that, but is willing to try Citalopram if I prescribe it.  I think this is at least a place to start.  I Rx'd that today for him and discussed possible risks and side effects of this medication.  Due to issues with lack of time and energy and difficulty coping at work, he would rather speak over the phone as much as possible and see me as needed in person.  I explained I am happy to work him in PRN when it will be helpful.  He knows to cancel his imaging in [place] since it was done at [facility].  He expressed gratitude for the call and I reiterated that I am here to help in any way I can - I am with him in this struggle.  6) His wife, [name], is on the call list; she called in the PM, to review the plan.  I answered her questions to her satisfaction and discussed her husband's depressed mood with her.  She will discuss this further with him.  I provided emotional support to her over the  phone and reiterated my willingness to see her in person, refer her for counseling, or talk with her over the phone as needed.  She had a gym class to attend so we spoke for just several minutes but she expressed appreciation for the call.  7) I added his case to ENT tumor board.  -----------------------------------  [name] Gibson, MD

## 2017-06-04 NOTE — Progress Notes (Signed)
[name], [name] ([id number]) Visit Report for 06/04/2017 Arrival Information Details Patient Name: [name], [name] Date of Service: 06/04/2017 8:00 AM Medical Record Number: [id number] Patient Account Number: [id number] Date of Birth/Sex: November 05, 1975 (42 y.o. Male) Treating RN: Primary Care Shlomo Seres: Donavan Burnet Other Clinician: Jacqulyn Bath Referring Naviyah Schaffert: Donavan Burnet Treating Caroleen Stoermer/Extender: Frann Rider in Treatment: 2 Visit Information History Since Last Visit Added or deleted any medications: No Patient Arrived: Ambulatory Any new allergies or adverse reactions: No Arrival Time: 08:20 Had a fall or experienced change in No Accompanied By: self activities of daily living that may affect Transfer Assistance: None risk of falls: Patient Identification Verified: Yes Signs or symptoms of abuse/neglect since last No Secondary Verification Process Yes visito Completed: Hospitalized since last visit: No Patient Requires Transmission-Based No Pain Present Now: No Precautions: Patient Has Alerts: No Electronic Signature(s) Signed: 06/04/2017 10:23:24 AM By: Lorine Bears RCP, RRT, CHT Entered By: Lorine Bears on 06/04/2017 08:21:29 [name] ([id number]) -------------------------------------------------------------------------------- Encounter Discharge Information Details Patient Name: [name] Date of Service: 06/04/2017 8:00 AM Medical Record Number: [id number] Patient Account Number: [id number] Date of Birth/Sex: [birth year] (41 y.o. Male) Treating RN: Primary Care Lavell Supple: Donavan Burnet Other Clinician: Jacqulyn Bath Referring Kianni Lheureux: Donavan Burnet Treating Ladd Cen/Extender: Frann Rider in Treatment: 2 Encounter Discharge Information Items Discharge Pain Level: 0 Discharge Condition: Stable Ambulatory Status: Ambulatory Discharge Destination: Home Private Transportation: Auto Accompanied By:  self Schedule Follow-up Appointment: No Medication Reconciliation completed and No provided to Patient/Care Marli Diego: Clinical Summary of Care: Notes Patient's treatments are suspended as of today per Dr. Con Memos and patient's radiation oncologist. Electronic Signature(s) Signed: 06/04/2017 10:23:24 AM By: Lorine Bears RCP, RRT, CHT Entered By: Lorine Bears on 06/04/2017 10:19:06 [name] ([id number]) -------------------------------------------------------------------------------- Alsey Details Patient Name: [name] Date of Service: 06/04/2017 8:00 AM Medical Record Number: [id number] Patient Account Number: [id number] Date of Birth/Sex: 1976/08/23 (41 y.o. Male) Treating RN: Primary Care Annamay Laymon: Donavan Burnet Other Clinician: Jacqulyn Bath Referring Lenni Reckner: Donavan Burnet Treating Alvena Kiernan/Extender: Frann Rider in Treatment: 2 Vital Signs Time Taken: 07:55 Temperature (F): 98.1 Height (in): 70 Pulse (bpm): 84 Weight (lbs): 145 Respiratory Rate (breaths/min): 16 Body Mass Index (BMI): 20.8 Blood Pressure (mmHg): 102/70 Reference Range: 80 - 120 mg / dl Electronic Signature(s) Signed: 06/04/2017 10:23:24 AM By: Lorine Bears RCP, RRT, CHT Entered By: Becky Sax, Amado Nash on 06/04/2017 08:22:05

## 2017-06-04 NOTE — Progress Notes (Signed)
[name], [name] ([id number]) Visit Report for 06/01/2017 HBO Details Patient Name: [name], [name] Date of Service: 06/01/2017 8:00 AM Medical Record Number: [id number] Patient Account Number: [id number] Date of Birth/Sex: 10-Mar-1976 (41 y.o. 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 HBO Treatment Details Treatment Number: 10 Patient Type: Outpatient Chamber Type: Monoplace Chamber Serial #: [id number] Treatment Protocol: 2.0 ATA with 90 minutes oxygen, and no air breaks Treatment Details Compression Rate Down: 1.5 psi / minute De-Compression Rate Up: 1.5 psi / minute Air breaks and breathing Compress Tx Pressure periods Decompress Decompress Begins Reached (leave unused spaces Begins Ends blank) Chamber Pressure (ATA) 1 2 - - - - - - 2 1 Clock Time (24 hr) 08:07 08:18 - - - - - - 09:49 09:59 Treatment Length: 112 (minutes) Treatment Segments: 4 Capillary Blood Glucose Pre Capillary Blood Glucose (mg/dl): Post Capillary Blood Glucose (mg/dl): Vital Signs Capillary Blood Glucose Reference Range: 80 - 120 mg / dl HBO Diabetic Blood Glucose Intervention Range: <131 mg/dl or >249 mg/dl Time Vitals Blood Respiratory Capillary Blood Glucose Pulse Action Type: Pulse: Temperature: Taken: Pressure: Rate: Glucose (mg/dl): Meter #: Oximetry (%) Taken: Pre 08:00 100/64 84 16 98.4 Post 10:03 102/72 66 16 98.1 Treatment Response JANELLE, CULTON ([id number]) Treatment Well Toleration: Treatment Treatment Completed without Adverse Event Completion Status: HBO Attestation I certify that I supervised this HBO treatment  in accordance with Medicare guidelines. A trained Yes emergency response team is readily available per hospital policies and procedures. Continue HBOT as ordered. Yes Electronic Signature(s) Signed: 06/01/2017 5:03:48 PM By: Worthy Keeler PA-C Previous Signature: 06/01/2017 10:37:43 AM Version By: Lorine Bears RCP, RRT, CHT Previous Signature: 06/01/2017 3:50:36 PM Version By: Worthy Keeler PA-C Entered By: Worthy Keeler on 06/01/2017 17:03:01 [name] ([id number]) -------------------------------------------------------------------------------- HBO Safety Checklist Details Patient Name: [name] Date of Service: 06/01/2017 8:00 AM Medical Record Number: [id number] Patient Account Number: [id number] Date of Birth/Sex: 1976/07/29 (41 y.o. Male) Treating RN: Primary Care Ahad Colarusso: Donavan Burnet Other Clinician: Jacqulyn Bath Referring Verlan Grotz: Donavan Burnet Treating Tykera Skates/Extender: Worthy Keeler Weeks in Treatment: 2 HBO Safety Checklist Items Safety Checklist Consent Form Signed Patient voided / foley secured and emptied When did you last eato 05/31/17 pm Last dose of injectable or oral agent n/a NA Ostomy pouch emptied and vented if applicable NA All implantable devices assessed, documented and approved NA Intravenous access site secured and place Valuables secured Linens and cotton and cotton/polyester blend (less than 51% polyester) Personal oil-based products / skin lotions / body lotions removed NA Wigs or hairpieces removed NA Smoking or tobacco materials removed Books / newspapers / magazines / loose paper removed Cologne, aftershave, perfume and deodorant removed Jewelry removed (may wrap wedding band) NA Make-up removed Hair care products removed Battery operated devices (external) removed Heating patches and chemical warmers removed NA Titanium eyewear removed NA Nail polish cured greater than 10 hours NA Casting material  cured greater than 10 hours NA Hearing aids removed NA Loose dentures or partials removed NA Prosthetics have been removed Patient demonstrates correct use of air break device (if applicable) Patient concerns have been addressed Patient grounding bracelet on and cord attached to chamber Specifics for Inpatients (complete in addition to  above) Medication sheet sent with patient Intravenous medications needed or due during therapy sent with patient RASHEE, MARSCHALL ([id number]) Drainage tubes (e.g. nasogastric tube or chest tube secured and vented) Endotracheal or Tracheotomy tube secured Cuff deflated of air and inflated with saline Airway suctioned Electronic Signature(s) Signed: 06/01/2017 10:37:43 AM By: Lorine Bears RCP, RRT, CHT Entered By: Lorine Bears on 06/01/2017 35:67:01

## 2017-06-05 ENCOUNTER — Other Ambulatory Visit: Payer: Self-pay

## 2017-06-05 ENCOUNTER — Telehealth: Payer: Self-pay | Admitting: *Deleted

## 2017-06-05 ENCOUNTER — Encounter: Payer: Self-pay | Admitting: Internal Medicine

## 2017-06-05 NOTE — Progress Notes (Signed)
IFEANYI, MICKELSON ([id number]) Visit Report for 06/04/2017 Physician Orders Details Patient Name: [name], [name] Date of Service: 06/04/2017 8:00 AM Medical Record Number: [id number] Patient Account Number: [id number] Date of Birth/Sex: [birth year] (41 y.o. 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 Male) Treating RN: Primary Care Provider: Donavan Burnet Other Clinician: Jacqulyn Bath Referring Provider: Donavan Burnet Treating Provider/Extender: Frann Rider in Treatment: 2 Active Problems ICD-10 Encounter Code Description Active Date Diagnosis L59.8 Other specified disorders of the skin and subcutaneous 05/17/2017 Yes tissue related to radiation G04.89 Other myelitis 05/17/2017 Yes Inactive Problems Resolved Problems Electronic Signature(s) Signed: 06/04/2017 3:16:27 PM By: Christin Fudge MD, FACS Entered By: Christin Fudge on 06/04/2017 10:29:51 [name] ([id number]) -------------------------------------------------------------------------------- SuperBill Details Patient Name: [name] Date of Service: 06/04/2017 Medical Record Number: [id number] Patient Account Number: [id number] Date of Birth/Sex: 1976/06/15 (41  y.o. 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 G04.89 Other myelitis Facility Procedures CPT4 Code: [id number] Description: (Facility Use Only) HBOT, full body chamber, 98min Modifier: Quantity: 4 Physician Procedures CPT4: Description Modifier Quantity Code [id number] - WC PHYS HYPERBARIC OXYGEN THERAPY 1 ICD-10 Description Diagnosis 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

## 2017-06-05 NOTE — Telephone Encounter (Signed)
Called patient to inform of US biopsy for 06-07-17 @ 8 am @ [facility] Radiology and his visit with Dr. Wilburn Cornelia on 06-12-17 - arrival time - 8:15 am, lvm for a return call

## 2017-06-06 ENCOUNTER — Encounter: Payer: Self-pay | Admitting: Internal Medicine

## 2017-06-06 ENCOUNTER — Other Ambulatory Visit: Payer: Self-pay | Admitting: Radiology

## 2017-06-07 ENCOUNTER — Ambulatory Visit (HOSPITAL_COMMUNITY)
Admission: RE | Admit: 2017-06-07 | Discharge: 2017-06-07 | Disposition: A | Discharge status: Home / Self Care | Payer: Managed Care, Other (non HMO) | Source: Ambulatory Visit | Attending: Radiation Oncology | Admitting: Radiation Oncology

## 2017-06-07 ENCOUNTER — Encounter (HOSPITAL_COMMUNITY): Payer: Self-pay

## 2017-06-07 DIAGNOSIS — Z803 Family history of malignant neoplasm of breast: Secondary | ICD-10-CM | POA: Diagnosis not present

## 2017-06-07 DIAGNOSIS — C771 Secondary and unspecified malignant neoplasm of intrathoracic lymph nodes: Secondary | ICD-10-CM | POA: Insufficient documentation

## 2017-06-07 DIAGNOSIS — R221 Localized swelling, mass and lump, neck: Secondary | ICD-10-CM

## 2017-06-07 DIAGNOSIS — I341 Nonrheumatic mitral (valve) prolapse: Secondary | ICD-10-CM | POA: Diagnosis not present

## 2017-06-07 DIAGNOSIS — K219 Gastro-esophageal reflux disease without esophagitis: Secondary | ICD-10-CM | POA: Diagnosis not present

## 2017-06-07 DIAGNOSIS — Z808 Family history of malignant neoplasm of other organs or systems: Secondary | ICD-10-CM | POA: Insufficient documentation

## 2017-06-07 DIAGNOSIS — Z806 Family history of leukemia: Secondary | ICD-10-CM | POA: Insufficient documentation

## 2017-06-07 DIAGNOSIS — Z801 Family history of malignant neoplasm of trachea, bronchus and lung: Secondary | ICD-10-CM | POA: Diagnosis not present

## 2017-06-07 DIAGNOSIS — C090 Malignant neoplasm of tonsillar fossa: Secondary | ICD-10-CM | POA: Diagnosis present

## 2017-06-07 DIAGNOSIS — R590 Localized enlarged lymph nodes: Secondary | ICD-10-CM | POA: Diagnosis present

## 2017-06-07 LAB — CBC
HCT: 38.9 % — ABNORMAL LOW (ref 39.0–52.0)
Hemoglobin: 13.7 g/dL (ref 13.0–17.0)
MCH: 30.2 pg (ref 26.0–34.0)
MCHC: 35.2 g/dL (ref 30.0–36.0)
MCV: 85.9 fL (ref 78.0–100.0)
Platelets: 191 10*3/uL (ref 150–400)
RBC: 4.53 MIL/uL (ref 4.22–5.81)
RDW: 11.8 % (ref 11.5–15.5)
WBC: 3.2 10*3/uL — ABNORMAL LOW (ref 4.0–10.5)

## 2017-06-07 LAB — PROTIME-INR
INR: 1.12
Prothrombin Time: 14.5 seconds (ref 11.4–15.2)

## 2017-06-07 LAB — APTT: aPTT: 31 seconds (ref 24–36)

## 2017-06-07 MED ORDER — LIDOCAINE HCL (PF) 1 % IJ SOLN
INTRAMUSCULAR | Status: AC
  Filled 2017-06-07: qty 30

## 2017-06-07 MED ORDER — SODIUM CHLORIDE 0.9 % IV SOLN: INTRAVENOUS | Status: DC

## 2017-06-07 MED ORDER — SODIUM CHLORIDE 0.9 % IV SOLN
INTRAVENOUS | Status: AC | PRN
  Administered 2017-06-07: 10 mL/h via INTRAVENOUS

## 2017-06-07 MED ORDER — FENTANYL CITRATE (PF) 100 MCG/2ML IJ SOLN
INTRAMUSCULAR | Status: AC
  Filled 2017-06-07: qty 2

## 2017-06-07 MED ORDER — MIDAZOLAM HCL 2 MG/2ML IJ SOLN
INTRAMUSCULAR | Status: AC | PRN
  Administered 2017-06-07 (×2): 1 mg via INTRAVENOUS

## 2017-06-07 MED ORDER — FENTANYL CITRATE (PF) 100 MCG/2ML IJ SOLN
INTRAMUSCULAR | Status: AC | PRN
  Administered 2017-06-07 (×2): 50 ug via INTRAVENOUS

## 2017-06-07 MED ORDER — MIDAZOLAM HCL 2 MG/2ML IJ SOLN
INTRAMUSCULAR | Status: AC
  Filled 2017-06-07: qty 2

## 2017-06-07 NOTE — Procedures (Signed)
Interventional Radiology Procedure Note  Procedure: US guided left neck lymph node biopsy.  Mx 86V core   Complications: None  Recommendations:  - Ok to shower tomorrow - Do not submerge for 7 days - Routine wound care - advance diet - DC in 1 hr   Signed,  Dulcy Fanny. Earleen Newport, DO

## 2017-06-07 NOTE — Sedation Documentation (Signed)
Patient is resting comfortably. 
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
## 2017-06-07 NOTE — Sedation Documentation (Signed)
02 d/c 

## 2017-06-07 NOTE — Sedation Documentation (Signed)
Patient denies pain and is resting comfortably.  

## 2017-06-07 NOTE — Discharge Instructions (Addendum)
Biopsy Discharge Instructions  The procedure you just had is called a biopsy.  You may feel some discomfort after the local anesthetic wears off.  Your discomfort should improve over the next several days.  AFTER YOUR BIOPSY  Rest for the remainder of the day.  Avoid heavy lifting (more than 10 lb/4.5 kg).  If you have been given a general anesthetic or other medications to help you relax, you should not operate machinery, drive or make legal decisions for 24 hours after your procedure.  Additionally, someone must be available to drive you home.  Only take over-the-counter or prescription medicines for pain, discomfort, or fever as directed by your caregiver.  This can make bleeding worse.  You may resume your usual diet after the procedure.  Avoid alcoholic beverages for 24 hours after your procedure.  Keep the skin around your biopsy site clean and dry.  You may shower after 24 hours.  Cleanse and dry the biopsy site completely after you shower.  Avoid baths and swimming for 72 hours.  Complications are very uncommon after this procedure.  Go to the nearest Emergency Department or contact your caregiver if you develop any of the following symptoms:  Worsening pain  Bleeding  Swelling at the biopsy site  Light headedness or dizziness  Shortness of Breath  Fever or chills Redness or increased pain or swelling at the biopsy site   Moderate Conscious Sedation, Adult, Care After These instructions provide you with information about caring for yourself after your procedure. Your health care provider may also give you more specific instructions. Your treatment has been planned according to current medical practices, but problems sometimes occur. Call your health care provider if you have any problems or questions after your procedure. What can I expect after the procedure? After your procedure, it is common:  To feel sleepy for several hours.  To feel clumsy and have poor balance  for several hours.  To have poor judgment for several hours.  To vomit if you eat too soon.  Follow these instructions at home: For at least 24 hours after the procedure:   Do not: ? Participate in activities where you could fall or become injured. ? Drive. ? Use heavy machinery. ? Drink alcohol. ? Take sleeping pills or medicines that cause drowsiness. ? Make important decisions or sign legal documents. ? Take care of children on your own.  Rest. Eating and drinking  Follow the diet recommended by your health care provider.  If you vomit: ? Drink water, juice, or soup when you can drink without vomiting. ? Make sure you have little or no nausea before eating solid foods. General instructions  Have a responsible adult stay with you until you are awake and alert.  Take over-the-counter and prescription medicines only as told by your health care provider.  If you smoke, do not smoke without supervision.  Keep all follow-up visits as told by your health care provider. This is important. Contact a health care provider if:  You keep feeling nauseous or you keep vomiting.  You feel light-headed.  You develop a rash.  You have a fever. Get help right away if:  You have trouble breathing. This information is not intended to replace advice given to you by your health care provider. Make sure you discuss any questions you have with your health care provider. Document Released: 07/23/2013 Document Revised: 03/06/2016 Document Reviewed: 01/22/2016 Elsevier Interactive Patient Education  2018 [place]. Moderate Conscious Sedation, Adult, Care After  These instructions provide you with information about caring for yourself after your procedure. Your health care provider may also give you more specific instructions. Your treatment has been planned according to current medical practices, but problems sometimes occur. Call your health care provider if you have any problems or  questions after your procedure. What can I expect after the procedure? After your procedure, it is common:  To feel sleepy for several hours.  To feel clumsy and have poor balance for several hours.  To have poor judgment for several hours.  To vomit if you eat too soon.  Follow these instructions at home: For at least 24 hours after the procedure:   Do not: ? Participate in activities where you could fall or become injured. ? Drive. ? Use heavy machinery. ? Drink alcohol. ? Take sleeping pills or medicines that cause drowsiness. ? Make important decisions or sign legal documents. ? Take care of children on your own.  Rest. Eating and drinking  Follow the diet recommended by your health care provider.  If you vomit: ? Drink water, juice, or soup when you can drink without vomiting. ? Make sure you have little or no nausea before eating solid foods. General instructions  Have a responsible adult stay with you until you are awake and alert.  Take over-the-counter and prescription medicines only as told by your health care provider.  If you smoke, do not smoke without supervision.  Keep all follow-up visits as told by your health care provider. This is important. Contact a health care provider if:  You keep feeling nauseous or you keep vomiting.  You feel light-headed.  You develop a rash.  You have a fever. Get help right away if:  You have trouble breathing. This information is not intended to replace advice given to you by your health care provider. Make sure you discuss any questions you have with your health care provider. Document Released: 07/23/2013 Document Revised: 03/06/2016 Document Reviewed: 01/22/2016 Elsevier Interactive Patient Education  2018 [place]. Needle Biopsy, Care After Refer to this sheet in the next few weeks. These instructions provide you with information about caring for yourself after your procedure. Your health care provider  may also give you more specific instructions. Your treatment has been planned according to current medical practices, but problems sometimes occur. Call your health care provider if you have any problems or questions after your procedure. What can I expect after the procedure? After your procedure, it is common to have soreness, bruising, or mild pain at the biopsy site. This should go away in a few days. Follow these instructions at home:  Rest as directed by your health care provider.  Take medicines only as directed by your health care provider.  There are many different ways to close and cover the biopsy site, including stitches (sutures), skin glue, and adhesive strips. Follow your health care provider's instructions about: ? Biopsy site care. ? Bandage (dressing) changes and removal. ? Biopsy site closure removal.  Check your biopsy site every day for signs of infection. Watch for: ? Redness, swelling, or pain. ? Fluid, blood, or pus. Contact a health care provider if:  You have a fever.  You have redness, swelling, or pain at the biopsy site that lasts longer than a few days.  You have fluid, blood, or pus coming from the biopsy site.  You feel nauseous.  You vomit. Get help right away if:  You have shortness of breath.  You have trouble  breathing.  You have chest pain.  You feel dizzy or you faint.  You have bleeding that does not stop with pressure or a bandage.  You cough up blood.  You have pain in your abdomen. This information is not intended to replace advice given to you by your health care provider. Make sure you discuss any questions you have with your health care provider. Document Released: 02/16/2015 Document Revised: 03/09/2016 Document Reviewed: 09/28/2014 Elsevier Interactive Patient Education  [employer].

## 2017-06-08 ENCOUNTER — Encounter: Payer: Self-pay | Admitting: Surgery

## 2017-06-11 ENCOUNTER — Encounter: Payer: Self-pay | Admitting: Surgery

## 2017-06-11 ENCOUNTER — Ambulatory Visit: Payer: Self-pay | Admitting: Surgery

## 2017-06-12 ENCOUNTER — Encounter: Payer: Self-pay | Admitting: Internal Medicine

## 2017-06-12 DIAGNOSIS — C7989 Secondary malignant neoplasm of other specified sites: Secondary | ICD-10-CM | POA: Insufficient documentation

## 2017-06-13 ENCOUNTER — Ambulatory Visit (INDEPENDENT_AMBULATORY_CARE_PROVIDER_SITE_OTHER): Payer: Managed Care, Other (non HMO) | Admitting: Adult Health

## 2017-06-13 ENCOUNTER — Encounter: Payer: Self-pay | Admitting: Adult Health

## 2017-06-13 VITALS — BP 111/74 | HR 76 | Ht 71.0 in | Wt 145.4 lb

## 2017-06-13 DIAGNOSIS — R202 Paresthesia of skin: Secondary | ICD-10-CM

## 2017-06-13 DIAGNOSIS — C090 Malignant neoplasm of tonsillar fossa: Secondary | ICD-10-CM

## 2017-06-13 DIAGNOSIS — M79602 Pain in left arm: Secondary | ICD-10-CM

## 2017-06-13 DIAGNOSIS — M79601 Pain in right arm: Secondary | ICD-10-CM

## 2017-06-13 NOTE — Progress Notes (Signed)
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 He returns today for follow-up. We have tried gabapentin, Topamax and hyperbaric oxygen therapy to treat his discomfort. He states that he continues to have the fasciculations in the left shoulder down into the chest. He also reports that he is losing sensation in the hands and feels as if his traveling up to the forearm. He states he also began to experience fasciculations in the right shoulder and down the right back. The patient did go to [facility] to have a second opinion with the radiologist oncologist. 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 The patient reports that he was told that there is a chance that he lose function in the left arm and be completely paralyzed. He also recently found out that his cancer has returned. Reports that he will be going to [facility] to have a lymph node surgically removed. Reports that this is a risky surgery due to scar tissue. Reports that he had to stop oxygen therapy due to the return of his cancer. He reports that the radiology oncologist placed him on vitamin E and Trental. The patient has continued on Topamax and gabapentin. Naturally the patient has lots of concerns and questions regarding potential progression and treatment options.   HISTORY [name] is a 41 year old male with a history of cancer of the tonsil fossa, migraine headaches and paresthesias. He returns today for follow-up. He reports that he discontinued amitriptyline as it was not beneficial. He states that he is currently taking gabapentin 600 mg twice a day. He reports he has not seen much change in his symptoms since he started gabapentin. He reports that he has a  sensation that starts in the left side of the neck and travels down the left shoulder and down the arm. He describes this as a trembling sensation. He does not describe this as an electrical type pain. He states this happens on a daily basis and is essentially there continuously. He reports that his oncologist has noticed muscle atrophy in the trapezius muscle on the left side. Patient reports that he can move the left arm a certain way and it feels as if his trapezius muscle spasms and this elicits pain.   He is having  3-4 headaches a week. He states that the pain starts at the neck on the left side and radiates behind the left ear into the left eye. He states that the left side of his head will feel numb. He does have photophobia, phonophobia but denies nausea and vomiting. He states these episodes last anywhere from 1 hour to 6 hours. He does try taking Excedrin migraine and caffeine when he has the headaches. Reports he also has a bouncing sensation in the left eye that started during radiation. He reports that he can be looking out his window at home staring at a log and it'll look as if it's moving up and down but only with the left eye.   He also reports ongoing back pain. He states that when he initially came to the office he was having intermittent numbness, burning and tingling in the lower extremities. He also felt that his legs were weak. He states that this has gotten slightly better although  he still has the pins and needle sensations in the legs intermittently. He does report that the more he works out it seems that the symptoms may be getting better although he is unsure if it may be gabapentin that helping. He returns today for an evaluation.   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 Isidore [name] 04/21/16), status post left  tonsillectomy, nasopharyngeal biopsies, base of tongue biopsy in June 2017, status post intensity modulated radiation therapy from August until September 2017, here for evaluation of constellation of symptoms according headache, back pain, numbness, pain, cramping.  Patient had intermittent sore throat sensation from 2014 2 2015. He went to PCP for evaluation, treated empirically for upper respiratory infection and strep throat. In March 2017 patient noted a small mass on the left side of his throat. One day he woke up sweating and confused. He followed up at the emergency room for evaluation, was set up with ENT evaluation and suited had diagnosis of left tonsil squamous cell carcinoma. Patient was treated with left tonsillectomy, and then several rounds of radiation therapy to the tonsils and bilateral neck.  During this time patient continued to eat by mouth and was supplemented with boost shakes. However he gradually lost approximate 40 pounds of weight during his treatment. Patient weight declined from 185 down to 145 pounds.  Around the same timeframe patient started to have intermittent shooting electrical sensations in his legs, arms, body. Patient had intermittent headaches, typically left-sided, associated with photophobia and nausea. He has several of these headaches per week up to 3 per week. He also has lower grade daily headache.  Patient also having intermittent right lower back pain, sometimes radiating to his right leg. Patient has an episodes of exercise-induced numbness and weakness in the bilateral lower extremities. He describes intermittent right sided foot drop.  Patient having more anxiety, depression, insomnia, memory loss and confusion. His sleep and insomnia issues are mainly related to uncomfortable sensation in the evening and bedtime.  Patient was treated with fentanyl previously, and due to concern of possible opioid side effect this was gradually weaned off. However  his symptoms did not improve. He has used small course of hydrocodone with Tylenol for back pain without relief. One week ago he was started on amitriptyline to help with insomnia and nerve pain. He has not noticed benefit yet.  Patient had MRI of the brain, cervical spine, thoracic spine and lumbar spine which I reviewed. No significant abnormalities to explain his neurologic symptoms at this time.  Patient also had lab testing to look for alternate causes of neuropathy which were unremarkable.   REVIEW OF SYSTEMS: Out of a complete 14 system review of symptoms, the patient complains only of the following symptoms, and all other reviewed systems are negative.  Back pain, aching muscles, muscle cramps, walking difficulty, neck pain, neck stiffness, confusion, decreased concentration, depression, nervous/anxious, tremors, weakness, speech difficulty, numbness, headache, dizziness, memory loss, swollen lymph nodes, fatigue, activity change, eye pain  ALLERGIES: No Known Allergies  HOME MEDICATIONS: Outpatient Medications Prior to Visit  Medication Sig Dispense Refill  . citalopram (CELEXA) 20 MG tablet Take 1 tablet (20 mg total) by mouth daily. 30 tablet 5  . gabapentin (NEURONTIN) 300 MG capsule Take 1 capsule (300 mg total) by mouth 2 (two) times daily. (Patient taking differently: Take 600 mg by mouth  2 (two) times daily. ) 60 capsule 6  . pentoxifylline (TRENTAL) 400 MG CR tablet Take 400mg  tab daily x 1 week, then 400mg  BID; take with food 60 tablet 5  . ranitidine (ZANTAC) 150 MG capsule Take 150 mg by mouth as needed for heartburn.    . sodium fluoride (FLUORISHIELD) 1.1 % GEL dental gel Instill one drop of gel per tooth space of fluoride tray. Place over teeth for 5 minutes. Remove. Spit out excess. Repeat nightly. 120 mL prn  . topiramate (TOPAMAX) 25 MG tablet Take 3 tablets (75 mg total) by mouth at bedtime. 90 tablet 3  . vitamin E 400 UNIT capsule Take 400 IU daily x 1 week,  then 400 IU BID 60 capsule 5   No facility-administered medications prior to visit.     PAST MEDICAL HISTORY: Past Medical History:  Diagnosis Date  . Back injury due to skiing accident 2004   jet ski accident  . Cancer [facility])    tosillar, s/p radiation therapy  . GERD (gastroesophageal reflux disease)   . History of radiation therapy 05/22/2016 - 07/05/2016   Tonsils and bilateral neck 70 Gy 35 fractions to gross disease, 63 Gy 35 fractions to high risk nodal echelons, 56 Gy 35 fractions to intermediate risk nodal echelons  . Mitral valve prolapse   . Weight loss     PAST SURGICAL HISTORY: Past Surgical History:  Procedure Laterality Date  . ELBOW SURGERY Right   . KNEE SURGERY Left   . NASOPHARYNGOSCOPY Left 04/14/2016   Procedure: BASE NASAL NASOPHARYNX;  Surgeon: Jerrell Belfast, MD;  Location: [place];  Service: ENT;  Laterality: Left;  . TONSILLECTOMY Left 04/14/2016   Procedure: TONSILLECTOMY AND BIOPSY OF TONGUE ;  Surgeon: Jerrell Belfast, MD;  Location: [place];  Service: ENT;  Laterality: Left;    FAMILY HISTORY: Family History  Problem Relation Age of Onset  . Cancer Mother        Breast  . Cancer Father        Lung  . Cancer Brother        brain  . Cancer Maternal Grandfather        leukemia    SOCIAL HISTORY: Social History   Social History  . Marital status: Married    Spouse name: N/A  . Number of children: 0  . Years of education: 12   Occupational History  .      [employer]   Social History Main Topics  . Smoking status: Never Smoker  . Smokeless tobacco: Former Systems developer  . Alcohol use 0.0 oz/week     Comment: social: Research scientist (life sciences) at [employer]  . Drug use: No  . Sexual activity: Not on file   Other Topics Concern  . Not on file   Social History Narrative   Lives with wife   caffeine , rare      PHYSICAL EXAM  Vitals:   06/13/17 0800  BP: 111/74  Pulse:  76  Weight: 145 lb 6.4 oz (66 kg)  Height: 5\' 11"  (1.803 m)   Body mass index is 20.28 kg/m.  Generalized: Well developed, in no acute distress   Neurological examination  Mentation: Alert oriented to time, place, history taking. Follows all commands speech and language fluent Cranial nerve II-XII: Pupils were equal round reactive to light. Extraocular movements were full, visual field were full on confrontational test. Facial sensation and strength were normal. Uvula tongue midline.  Head turning and shoulder shrug  were normal and symmetric. Motor: The motor testing reveals 5 over 5 strength of all 4 extremities. Good symmetric motor tone is noted throughout.  Sensory: Sensory testing is intact to soft touch on all 4 extremities. Decreased in the left upper extremity. No evidence of extinction is noted.  Coordination: Cerebellar testing reveals good finger-nose-finger and heel-to-shin bilaterally.  Gait and station: Gait is normal. Tandem gait is normal. Romberg is negative. No drift is seen.  Reflexes: Deep tendon reflexes are symmetric and normal bilaterally.   DIAGNOSTIC DATA (LABS, IMAGING, TESTING) - I reviewed patient records, labs, notes, testing and imaging myself where available.  Lab Results  Component Value Date   WBC 3.2 (L) 06/07/2017   HGB 13.7 06/07/2017   HCT 38.9 (L) 06/07/2017   MCV 85.9 06/07/2017   PLT 191 06/07/2017      Component Value Date/Time   NA 140 05/25/2017 1030   NA 139 08/21/2016 1405   K 4.7 05/25/2017 1030   K 4.5 08/21/2016 1405   CL 104 05/25/2017 1030   CL 103 10/22/2012 0146   CO2 30 05/25/2017 1030   CO2 29 08/21/2016 1405   GLUCOSE 83 05/25/2017 1030   GLUCOSE 93 08/21/2016 1405   BUN 16 05/25/2017 1030   BUN 10.8 08/21/2016 1405   CREATININE 1.03 05/25/2017 1030   CREATININE 0.8 08/21/2016 1405   CALCIUM 9.8 05/25/2017 1030   CALCIUM 9.9 08/21/2016 1405   PROT 7.3 05/25/2017 1030   PROT 7.3 08/21/2016 1405   ALBUMIN 4.7  05/25/2017 1030   ALBUMIN 4.2 08/21/2016 1405   AST 14 (L) 05/25/2017 1030   AST 15 08/21/2016 1405   ALT 10 (L) 05/25/2017 1030   ALT 12 08/21/2016 1405   ALKPHOS 40 05/25/2017 1030   ALKPHOS 48 08/21/2016 1405   BILITOT 2.6 (H) 05/25/2017 1030   BILITOT 2.17 (H) 08/21/2016 1405   GFRNONAA >60 05/25/2017 1030   GFRNONAA >60 10/22/2012 0146   GFRAA >60 05/25/2017 1030   GFRAA >60 10/22/2012 0146    Lab Results  Component Value Date   VITAMINB12 505 12/22/2016   Lab Results  Component Value Date   TSH 1.715 05/25/2017      ASSESSMENT AND PLAN 41 y.o. year old male  has a past medical history of Back injury due to skiing accident (2004); Cancer [facility]); GERD (gastroesophageal reflux disease); History of radiation therapy (05/22/2016 - 07/05/2016); Mitral valve prolapse; and Weight loss. here with:  1. Cancer of the tonsil fossa 2. Paresthesias in the upper extremity- potential brachial plexopathy  The patient has lots of concerns and questions today after he received a second opinion from [facility]. I advised that I would have Dr. Leta Baptist review the notes from [facility] as well as the imaging. Once he has review the notes we would give the patient a call. At this time we will not change his current medication regimen. He will continue on Topamax and gabapentin at this time. He is advised that if his symptoms worsen or he develops new symptoms he should let us know. He will follow-up in 3 months or sooner if needed.      Ward Givens, MSN, NP-C 06/13/2017, 8:07 AM [facility] [address], [place], [place] [address] [phone]

## 2017-06-13 NOTE — Patient Instructions (Signed)
Your Plan:  Continue Topamax and Gabapentin I will have Dr. Leta Baptist review your notes/images from [facility]  If your symptoms worsen or you develop new symptoms please let us know.   Thank you for coming to see [place] at [facility]. I hope we have been able to provide you high quality care today.  You may receive a patient satisfaction survey over the next few weeks. We would appreciate your feedback and comments so that we may continue to improve ourselves and the health of our patients.

## 2017-06-14 ENCOUNTER — Encounter: Payer: Self-pay | Admitting: Surgery

## 2017-06-15 ENCOUNTER — Telehealth: Payer: Self-pay | Admitting: Radiation Oncology

## 2017-06-15 ENCOUNTER — Encounter: Payer: Self-pay | Admitting: Surgery

## 2017-06-15 NOTE — Telephone Encounter (Signed)
Left a VM for patient letting him know I reviewed the positive pathology report and Dr. Victorio Palm notes, and that I was thinking of him and here to talk as needed.  Asked [name], RN, our Head and Neck Oncology Navigator to try to ensure speedy consultation with [facility]; a referral was made by Dr. Wilburn Cornelia.  -----------------------------------  Eppie Gibson, MD

## 2017-06-19 ENCOUNTER — Encounter: Payer: Self-pay | Admitting: Internal Medicine

## 2017-06-20 ENCOUNTER — Encounter: Payer: Self-pay | Admitting: Internal Medicine

## 2017-06-20 ENCOUNTER — Telehealth: Payer: Self-pay | Admitting: Radiation Oncology

## 2017-06-20 ENCOUNTER — Telehealth: Payer: Self-pay | Admitting: Adult Health

## 2017-06-20 NOTE — Telephone Encounter (Signed)
Pt calling stating that is has been a week since he was told that NP Jinny Blossom would speak with Dr Leta Baptist re: his care and he has yet to hear back from anyone.  Pt is asking for a call re: plan of action about his care

## 2017-06-20 NOTE — Telephone Encounter (Signed)
I spoke with patient via phone regarding his MRI brachial plexus and CT soft tissue neck findings. Patient found to have possible inflammation in left brachial plexus, concerning for radiation associated brachial plexopathy. 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  Patient continues to have muscle twitching, pain, numbness in his left arm and neck. He still has headaches.  Advised patient to continue on current medications for symptom control of nerve pain and headaches. Will see patient in follow-up in clinic.  Penni Bombard, MD 12/20/163, 8:00 PM Certified in Neurology, Neurophysiology and Neuroimaging  [facility] [address] [place], [place] [address] [phone]

## 2017-06-20 NOTE — Telephone Encounter (Signed)
I called patient. He was in another appointment. I will try calling him later. -VRP

## 2017-06-20 NOTE — Telephone Encounter (Signed)
Dr. Leta Baptist- can we discuss his case at lunch or this afternoon?

## 2017-06-21 ENCOUNTER — Encounter: Payer: Self-pay | Admitting: Surgery

## 2017-06-22 ENCOUNTER — Encounter: Payer: Self-pay | Admitting: Surgery

## 2017-06-22 NOTE — Telephone Encounter (Signed)
Spoke with Mr. Tolson today about his unfortunate relapse of cancer in the left neck mass.  I told him I would call Dr. Conley Canal of ENT at [facility] who sees him later today to discuss his case.  Spoke with Dr. Conley Canal to recap patient's history, and suggested restaging scans before salvage surgery. Dr. Conley Canal agrees a PET is prudent and he will order this.  Encourage patient and Dr. Conley Canal to call me at any time.  -----------------------------------  Eppie Gibson, MD

## 2017-06-25 ENCOUNTER — Encounter: Payer: Self-pay | Admitting: Surgery

## 2017-06-25 NOTE — Progress Notes (Signed)
I reviewed note and agree with plan. I also spoke with him by phone last week.   Penni Bombard, MD 0/51/8335, 8:25 PM Certified in Neurology, Neurophysiology and Neuroimaging  [facility] [address], [place], [place] [address] [phone]

## 2017-06-26 ENCOUNTER — Encounter: Payer: Self-pay | Admitting: Internal Medicine

## 2017-06-27 ENCOUNTER — Encounter: Payer: Self-pay | Admitting: Internal Medicine

## 2017-06-28 ENCOUNTER — Encounter: Payer: Self-pay | Admitting: Surgery

## 2017-06-29 ENCOUNTER — Encounter: Payer: Self-pay | Admitting: Surgery

## 2017-07-02 ENCOUNTER — Encounter: Payer: Self-pay | Admitting: Surgery

## 2017-07-03 ENCOUNTER — Encounter: Payer: Self-pay | Admitting: Internal Medicine

## 2017-07-04 ENCOUNTER — Encounter: Payer: Self-pay | Admitting: Gastroenterology

## 2017-07-04 ENCOUNTER — Encounter: Payer: Self-pay | Admitting: Internal Medicine

## 2017-07-04 HISTORY — PX: NECK DISSECTION: SUR422

## 2017-07-05 ENCOUNTER — Encounter: Payer: Self-pay | Admitting: Surgery

## 2017-07-06 ENCOUNTER — Encounter: Payer: Self-pay | Admitting: Surgery

## 2017-07-07 ENCOUNTER — Other Ambulatory Visit: Payer: Self-pay | Admitting: Adult Health

## 2017-07-09 ENCOUNTER — Encounter: Payer: Self-pay | Admitting: Surgery

## 2017-07-10 ENCOUNTER — Encounter: Payer: Self-pay | Admitting: Internal Medicine

## 2017-07-11 ENCOUNTER — Telehealth: Payer: Self-pay | Admitting: *Deleted

## 2017-07-11 ENCOUNTER — Encounter: Payer: Self-pay | Admitting: Internal Medicine

## 2017-07-11 NOTE — Telephone Encounter (Signed)
Oncology Nurse Navigator Documentation  In follow-up to fax received by Dr. Isidore Moos from [facility] case manager [name], spoke with [name]. Explained she has been trying to reach him but has been unsuccessful.  He stated he was previously contacted by [name], indicated he is not interested in case mgt services.  He stated he will speak with her again, noted his VM is working correctly.  I called [name], indicated I was able to speak with [name] re her fax to Dr. Isidore Moos, indicated he is immediately available to receive her call.  She thanked me for my outreach, said she would call him.  [name], RN, BSN, [place] Neck Oncology Nurse [place] at [place] [phone]

## 2017-07-12 ENCOUNTER — Ambulatory Visit (HOSPITAL_BASED_OUTPATIENT_CLINIC_OR_DEPARTMENT_OTHER): Payer: Managed Care, Other (non HMO) | Admitting: Internal Medicine

## 2017-07-12 ENCOUNTER — Encounter: Payer: Self-pay | Admitting: Internal Medicine

## 2017-07-12 ENCOUNTER — Encounter: Payer: Self-pay | Admitting: Surgery

## 2017-07-12 VITALS — BP 132/84 | HR 81 | Temp 98.1°F | Resp 18 | Ht 71.0 in | Wt 145.0 lb

## 2017-07-12 DIAGNOSIS — W888XXA Exposure to other ionizing radiation, initial encounter: Principal | ICD-10-CM

## 2017-07-12 DIAGNOSIS — R4189 Other symptoms and signs involving cognitive functions and awareness: Secondary | ICD-10-CM | POA: Diagnosis not present

## 2017-07-12 DIAGNOSIS — G540 Brachial plexus disorders: Secondary | ICD-10-CM | POA: Diagnosis not present

## 2017-07-12 DIAGNOSIS — C090 Malignant neoplasm of tonsillar fossa: Secondary | ICD-10-CM

## 2017-07-12 MED ORDER — DEXAMETHASONE 4 MG PO TABS: 4.0000 mg | ORAL_TABLET | Freq: Every day | ORAL | 0 refills | Status: AC

## 2017-07-12 NOTE — Progress Notes (Signed)
[place] [facility] [place],  [id number] [phone]   New Patient Evaluation  Date of Service: 07/12/17 Patient Name: [name] Patient MRN: [id number] Patient DOB: August 23, 1976 Provider: Ventura Sellers, MD  Identifying Statement:  [name] is a 41 y.o. 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    Referring Provider: Eppie Gibson, MD [address] [place], [place] [address]  Primary Cancer: Squamous cell tonsillar  Oncologic History:   Cancer of tonsillar fossa ([facility])   03/03/2016 Imaging    CT neck: Left jugulodigastric adenopathy correlating with palpable complaint.  Tonsils symmetrically prominent for age.       03/27/2016 Initial Biopsy    Left neck mass (consult slides), fine needle aspirate: Malignant cells present, favor squamous cell carcinoma.       04/07/2016 PET scan    Solitary (L) level 2 LN enlarged and exhibits malignant range FDG uptake.  Symmetric uptake in both tonsillar regions. No definite findings identified to suggest primary site of disease. No evidence of metastatic disease to chest, abdomen, pelvis, or bony structures.       04/14/2016 Procedure    Biopsy of left tonsil: (+) invasive squamous cell carcinoma, p16+.   Biopsies of nasopharynx x 4 (benign), base of tongue x 2 (benign), and left oropharynx (benign).       04/21/2016 Initial Diagnosis    Cancer of tonsillar fossa ([facility])     05/22/2016 - 07/05/2016 Radiation Therapy    Tonsils and bilateral neck / 70 Gy in 35 fractions to gross disease, 63 Gy in 35 fractions to high risk nodal echelons, and 56 Gy in 35 fractions to intermediate risk nodal echelons       History of Present Illness: The patient's records from the referring physician were obtained and reviewed and the patient interviewed to confirm this HPI.  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  He also describes frequent left arm fasciculations which can also at times spread to involve the right arm, chest, abdomen, face and even legs.   Since that time, an axillary MRI performed through radiation oncology at [facility] demonstrated likely radiation induced brachial plexopathy.  Unfortunately we do not have those images available for review today.  He has since been started on Trental and Vitamin E without improvement.  Overall, he feels that the sensory changes, hand clumsiness and fasciculations have continued to worsen over time.    Several days ago he had surgical resection of recurrent tumor in his left neck, which was tolerated well.  At that time (per the patient) the surgeon commented on the appearance of nerves which appeared very inflamed.  It is unclear what the cancer treatment plan is going to be until the biopsy results from surgery are returned.  His headaches persist, but have been stable.  He continues to take Topamax and Neurontin, managed by neurology.  Cognitive and memory issues, which had been limiting him at work, are still present but subjectively improved from our last visit with Dr. Isidore [name].  He has returned to work which has been fairly stressful, but helpful for his thinking and cognition.    He otherwise denies seizures, visual disturbance or gait impairment.    Medications: Current Outpatient Prescriptions  on File Prior to Visit  Medication Sig Dispense Refill  . citalopram (CELEXA) 20 MG tablet Take 1 tablet (20 mg total) by mouth daily. 30 tablet 5  . gabapentin (NEURONTIN) 300 MG capsule Take 1 capsule (300 mg total) by mouth 2 (two) times daily. (Patient taking differently: Take 600 mg by mouth 2 (two) times daily. ) 60 capsule 6  . pentoxifylline  (TRENTAL) 400 MG CR tablet Take 400mg  tab daily x 1 week, then 400mg  BID; take with food 60 tablet 5  . ranitidine (ZANTAC) 150 MG capsule Take 150 mg by mouth as needed for heartburn.    . sodium fluoride (FLUORISHIELD) 1.1 % GEL dental gel Instill one drop of gel per tooth space of fluoride tray. Place over teeth for 5 minutes. Remove. Spit out excess. Repeat nightly. 120 mL prn  . topiramate (TOPAMAX) 25 MG tablet TAKE 1 TABLET BY MOUTH AT BEDTIME FOR 1 WEEK THEN INCREASE TO 2 TABLETS AT BEDTIME THERAFTER 60 tablet 2  . vitamin E 400 UNIT capsule Take 400 IU daily x 1 week, then 400 IU BID 60 capsule 5   No current facility-administered medications on file prior to visit.     Allergies: No Known Allergies Past Medical History:  Past Medical History:  Diagnosis Date  . Back injury due to skiing accident 2004   jet ski accident  . Cancer [facility])    tosillar, s/p radiation therapy  . GERD (gastroesophageal reflux disease)   . History of radiation therapy 05/22/2016 - 07/05/2016   Tonsils and bilateral neck 70 Gy 35 fractions to gross disease, 63 Gy 35 fractions to high risk nodal echelons, 56 Gy 35 fractions to intermediate risk nodal echelons  . Mitral valve prolapse   . Weight loss    Past Surgical History:  Past Surgical History:  Procedure Laterality Date  . ELBOW SURGERY Right   . KNEE SURGERY Left   . NASOPHARYNGOSCOPY Left 04/14/2016   Procedure: BASE NASAL NASOPHARYNX;  Surgeon: Jerrell Belfast, MD;  Location: [facility];  Service: ENT;  Laterality: Left;  . TONSILLECTOMY Left 04/14/2016   Procedure: TONSILLECTOMY AND BIOPSY OF TONGUE ;  Surgeon: Jerrell Belfast, MD;  Location: [facility];  Service: ENT;  Laterality: Left;   Social History:  Social History   Social History  . Marital status: Married    Spouse name: N/A  . Number of children: 0  . Years of education: 12   Occupational History  .      [employer]   Social  History Main Topics  . Smoking status: Never Smoker  . Smokeless tobacco: Former Systems developer  . Alcohol use 0.0 oz/week     Comment: social: Research scientist (life sciences) at [employer]  . Drug use: No  . Sexual activity: Not on file   Other Topics Concern  . Not on file   Social History Narrative   Lives with wife   caffeine , rare   Family History:  Family History  Problem Relation Age of Onset  . Cancer Mother        Breast  . Cancer Father        Lung  . Cancer Brother        brain  . Cancer Maternal Grandfather        leukemia    Review of Systems: Constitutional: Denies fevers, chills or abnormal weight loss Eyes: Denies blurriness of vision Ears, nose, mouth, throat, and face: Denies mucositis  or sore throat Respiratory: Denies cough, dyspnea or wheezes Cardiovascular: Denies palpitation, chest discomfort or lower extremity swelling Gastrointestinal:  Denies nausea, constipation, diarrhea GU: Denies dysuria or incontinence Skin: Denies abnormal skin rashes Neurological: Per HPI Musculoskeletal: +back pain Behavioral/Psych: ++stress   Physical Exam: Vitals:   07/12/17 1204  BP: 132/84  Pulse: 81  Resp: 18  Temp: 98.1 F (36.7 C)  SpO2: 100%   KPS: 80. General: Alert, cooperative, pleasant, in no acute distress Head: Craniotomy scar noted, dry and intact. EENT: No conjunctival injection or scleral icterus. Oral mucosa moist Lungs: Resp effort normal Cardiac: Regular rate and rhythm Abdomen: Soft, non-distended abdomen Skin: No rashes cyanosis or petechiae. Extremities: No clubbing or edema  Neurologic Exam: Mental Status: Awake, alert, attentive to examiner. Oriented to self and environment. Language is fluent with intact comprehension.  Cranial Nerves: Visual acuity is grossly normal. Visual fields are full. Extra-ocular movements intact. No ptosis. Face is symmetric, tongue midline. Motor: Tone and bulk are normal. Power is full in both arms and  legs, although distally there is subtle weakness affecting left hand. Unable to obtain brachiaradialis, biceps or triceps reflexes in the left arm.  Otherwise no pathologic reflexes present. Intact finger to nose bilaterally Sensory: Impaired to light touch along left arm and hand medially Gait: Normal and tandem gait is normal.   Labs: I have reviewed the data as listed    Component Value Date/Time   NA 140 05/25/2017 1030   NA 139 08/21/2016 1405   K 4.7 05/25/2017 1030   K 4.5 08/21/2016 1405   CL 104 05/25/2017 1030   CL 103 10/22/2012 0146   CO2 30 05/25/2017 1030   CO2 29 08/21/2016 1405   GLUCOSE 83 05/25/2017 1030   GLUCOSE 93 08/21/2016 1405   BUN 16 05/25/2017 1030   BUN 10.8 08/21/2016 1405   CREATININE 1.03 05/25/2017 1030   CREATININE 0.8 08/21/2016 1405   CALCIUM 9.8 05/25/2017 1030   CALCIUM 9.9 08/21/2016 1405   PROT 7.3 05/25/2017 1030   PROT 7.3 08/21/2016 1405   ALBUMIN 4.7 05/25/2017 1030   ALBUMIN 4.2 08/21/2016 1405   AST 14 (L) 05/25/2017 1030   AST 15 08/21/2016 1405   ALT 10 (L) 05/25/2017 1030   ALT 12 08/21/2016 1405   ALKPHOS 40 05/25/2017 1030   ALKPHOS 48 08/21/2016 1405   BILITOT 2.6 (H) 05/25/2017 1030   BILITOT 2.17 (H) 08/21/2016 1405   GFRNONAA >60 05/25/2017 1030   GFRNONAA >60 10/22/2012 0146   GFRAA >60 05/25/2017 1030   GFRAA >60 10/22/2012 0146   Lab Results  Component Value Date   WBC 3.2 (L) 06/07/2017   NEUTROABS 1.7 05/25/2017   HGB 13.7 06/07/2017   HCT 38.9 (L) 06/07/2017   MCV 85.9 06/07/2017   PLT 191 06/07/2017    Imaging:  CLINICAL DATA:  Squamous cell carcinoma of the left tonsil.  EXAM: MRI HEAD WITHOUT AND WITH CONTRAST  TECHNIQUE: Multiplanar, multiecho pulse sequences of the brain and surrounding structures were obtained without and with intravenous contrast.  CONTRAST:  55mL MULTIHANCE GADOBENATE DIMEGLUMINE 529 MG/ML IV SOLN  COMPARISON:  CT of the neck 03/03/2016  FINDINGS: Brain: A  focal area of susceptibility is evident in the right frontal operculum  A developmental venous anomaly is seen adjacent to this lesion. 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 There is no other focal enhancement to suggest metastatic disease to the  brain or meninges.  No acute infarct, hemorrhage, or mass lesion is present. The ventricles are of normal size. No significant extraaxial fluid collection is present.  No significant white matter disease is present.  Vascular: Flow is present in the major intracranial arteries.  Skull and upper cervical spine: The skullbase is normal. The craniocervical junction is normal. Marrow signal is normal. The upper cervical spine is within normal limits. Midline sagittal structures are unremarkable.  Sinuses/Orbits: The paranasal sinuses and mastoid air cells are clear. The globes and orbits are within normal limits.  IMPRESSION: 1. 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 2. No evidence of metastatic disease to the brain.   Electronically Signed   By: San Morelle M.D.   On: 12/22/2016 17:05  CLINICAL DATA:  41 year old male with left tonsil squamous cell carcinoma status post head and neck radiation in September 2017. Unexplained head and back pain.  EXAM: MRI CERVICAL AND THORACIC SPINE WITHOUT AND WITH CONTRAST  TECHNIQUE: Multiplanar and multiecho pulse sequences of the cervical and thoracic spine, were obtained without and with intravenous contrast.  CONTRAST:  13 mL MultiHance  COMPARISON:  Lumbar spine without and with contrast 12/24/2016. Brain MRI without and with contrast the 12/22/2016.  Neck CT 03/03/2016   FINDINGS: MRI CERVICAL SPINE FINDINGS  Alignment: Preserved cervical lordosis.  No  spondylolisthesis.  Vertebrae: Increased cervical spine, visible skullbase, and upper thoracic spine vertebral T1 signal in keeping with previous external beam radiation.  There is mild degenerative appearing posterior endplate marrow edema with mild enhancement at C6-C7 (series 2, image 6). No other marrow edema or acute osseous abnormality.  Cord: Spinal cord signal is within normal limits at all visualized levels. No abnormal intradural enhancement.  Posterior Fossa, vertebral arteries, paraspinal tissues: Cervicomedullary junction is within normal limits. Negative visible brain parenchyma.  Mild generalized pharyngeal mucosal space as well as retropharyngeal/prevertebral soft tissue thickening and/or effusion compatible with prior radiation.  Major vascular flow voids are preserved. No neck mass or lymphadenopathy identified.  Disc levels:  C2-C3:  Negative.  C3-C4: Mild facet and uncovertebral hypertrophy. Mild left C4 foraminal stenosis.  C4-C5:  Negative.  C5-C6:  Mild uncovertebral hypertrophy.  No significant stenosis.  C6-C7: Disc space loss. Circumferential disc bulge and endplate spurring with broad-based posterior component (series 5, image 21). Borderline to mild spinal stenosis. Moderate to severe left and moderate right C7 foraminal stenosis.  C7-T1:  Borderline to mild facet hypertrophy.  No stenosis.  MRI THORACIC SPINE FINDINGS  Segmentation: Normal, and the numbering today is concordant with that on the recent lumbar MRI.  Alignment: Normal thoracic vertebral alignment. Mildly exaggerated upper thoracic kyphosis.  Vertebrae: Chronic mild T4 compression fracture, unchanged since the presentation neck spine CT on 03/03/2016. Beginning at the T4 level and continuing inferiorly there is decreased T1 and T2 marrow signal compatible with red marrow reactivation and the levels which were not radiated. Otherwise normal marrow signal.  No marrow edema or evidence of acute osseous abnormality.  Cord: Spinal cord signal is within normal limits at all visualized levels. Capacious thoracic spinal canal. No abnormal intradural enhancement.  Paraspinal and other soft tissues: Negative visualized thoracic and upper abdominal viscera. Negative visualized posterior paraspinal soft tissues.  Disc levels:  T1-T2: Negative.  T2-T3: Negative.  T3-T4: Mild disc space loss, disc bulge and uncovertebral hypertrophy. No stenosis.  T4-T5: Negative.  T5-T6: Mild facet hypertrophy.  T6-T7: Negative.  T7-T8: Negative.  T8-T9: Negative.  T9-T10: Mild facet hypertrophy.  T10-T11: Mild facet hypertrophy.  T11-T12: Small chronic T12 superior endplate Schmorl node. Otherwise negative.  IMPRESSION: 1. Expected post radiation changes to the bone marrow from the skullbase to the T4 level. 2. Mildly heterogeneous remaining thoracic (and lumbar) spine marrow signal compatible with compensatory red marrow reactivation. 3. No  metastatic disease identified. 4. Chronic disc and acute on chronic endplate degeneration at C6-C7. Associated moderate to severe left greater than right C7 neural foraminal stenosis, but no associated cervical spinal stenosis. 5. No other significant cervical spine degeneration. 6. Mild chronic T4 compression fracture is unchanged since the presentation neck CT in 2017. 7. No significant thoracic spine degeneration. 8. Post radiation changes to the neck soft tissues including mild retropharyngeal effusion.   Electronically Signed   By: Genevie Ann M.D.   On: 12/28/2016 13:19  Assessment/Plan 1. Radiation-induced brachial plexopathy Although this was my first objective evaluation today, there is clearly a subjective sense of decline.  I do think that his radiation exposure (and sensitivity) can explain the more diffuse nature of the fasciculations, although if he develops motor  symptoms in a second limb, more workup will be needed including repeat EMG/NCS.      I recommend physical therapy to prevent further weakness, atrophy, tendon shortening or posturing.    It is unclear if Vitamin E/Trental will help, but given the lack of good alternative it is reasonable to continue at this time.  We will also prescribe him a trial of decadron, 4mg  daily.  I asked him to take this for ~2 weeks, and then to give me a call to discuss continuing or tapering this therapy.  2. Cognitive changes This is stable to improved.  Likely underlying etiology is mild treatment effects combined with notable stressors at work and home.  I did not feel the need to screen him today with a MOCA.    We spent twenty additional minutes teaching regarding the natural history, biology, and historical experience in the treatment of neurologic complications of cancer. We also provided teaching sheets for the patient to take home as an additional resource.  In addition to the phone call, he should return to clinic in 3 months for a follow up evaluation.   We appreciate the opportunity to participate in the care of Xylon Croom.   All questions were answered. The patient knows to call the clinic with any problems, questions or concerns. No barriers to learning were detected.  I spent 40 minutes counseling the patient face to face. The total time spent in the appointment was 60 minutes and more than 50% was on counseling and review of test results  Ventura Sellers, MD Medical Director of Neuro-Oncology [facility] at [place] 07/12/17 12:12 PM

## 2017-07-13 ENCOUNTER — Encounter: Payer: Self-pay | Admitting: Surgery

## 2017-07-16 ENCOUNTER — Telehealth: Payer: Self-pay | Admitting: Diagnostic Neuroimaging

## 2017-07-16 ENCOUNTER — Encounter: Payer: Self-pay | Admitting: Surgery

## 2017-07-16 NOTE — Telephone Encounter (Signed)
[name]  [email or web address] office called to inform that Dr Conley Canal is not comfortable with long term steroid treatment use due to pt history with cancer but is okay with short term treatment.  [name] can be reached @ [phone] option 4, please call

## 2017-07-17 ENCOUNTER — Encounter: Payer: Self-pay | Admitting: Internal Medicine

## 2017-07-17 ENCOUNTER — Encounter: Payer: Self-pay | Admitting: Radiation Oncology

## 2017-07-17 NOTE — Progress Notes (Signed)
Telephone note:  I spoke with [name] by phone today and reviewed his surgical pathology results with him from [facility].  He underwent salvage neck dissection which revealed a single positive node with ECE that had relapsed.   He reports that his chronic neuropathy is affecting his sleep - pressure on his neck when he lies in bed causes headaches and more neck pain.  I asked his Physical therapists to call him to discuss his sx and brainstorm if there is a special pillow to reduce the neck/nerve pressure when he sleeps.  Or, I would be happy to make an OT referral is necessary. This is a big QOL issue for him. His sleep is poor because of it. We discussed wearing earplugs to avoid getting woken up by his wife, who keeps a different schedule.  I advised him to also reach out to Dr Mickeal Skinner about this.  He is returning to work.  He trying hard to keep his job.    I also reached out to Dr Conley Canal and Biagio Borg PA at [facility]. Asked them to  present [name] at their tumor board. I predict  no adjuvant systemic therapy is indicated (at least based on data) . but wondering re: the question of hyperbaric O2.  Curious about the board's thoughts re: pros and cons of this, given his ECE and risk of residual microscopic disease.  However, his neuropathy is bad, so HBO is one of his few options to possibly improve those symptoms.  I updated patient on the above.  He knows to call at any time with questions.  For now, he prefers to communicate by phone rather than see me regularly in the clinic due to a busy schedule and stress in his life. He is trying to get the rest he needs, and to maintain his immune system.    -----------------------------------  Eppie Gibson, MD

## 2017-07-18 ENCOUNTER — Encounter: Payer: Self-pay | Admitting: Internal Medicine

## 2017-07-24 ENCOUNTER — Telehealth: Payer: Self-pay

## 2017-07-24 NOTE — Telephone Encounter (Signed)
Mr. Bourbon called and left a voice mail with me today reporting swelling and pain at his recent surgery site. He had the surgery performed at [facility] by Dr. Conley Canal on 07/04/17. I spoke with Dr. Isidore Moos and she recommended that he call his surgeon or his ENT in [place] to have the area evaluated. I can see in Care Everywhere that he has called Dr. Jenne Campus office today and a note has been placed asking advise of Dr. Conley Canal. I called him and was able to leave a voice mail with all this information. I left my direct number for him to call back with any further questions or concerns.

## 2017-07-27 ENCOUNTER — Encounter: Payer: Self-pay | Admitting: Radiation Oncology

## 2017-07-27 ENCOUNTER — Telehealth: Payer: Self-pay | Admitting: *Deleted

## 2017-07-27 NOTE — Telephone Encounter (Signed)
Oncology Nurse Navigator Documentation  Rec'd call from [name] with update s/p Wed's visit with PCP [name] who he saw wco "golf ball sized swelling in groin".  PCP ordered biopsy of mass and CT of pelvis but denial received by insurance Psychologist, counselling).  PCP requesting assistance from [name].  I spoke with [name] who faxed to my attention office note/information for [name] review and advice.  Per his request, I sent [name] e-mail update.  [name] Orem, [name], BSN, [place] [facility] Nurse [place] at [place] [phone]

## 2017-07-27 NOTE — Progress Notes (Addendum)
Upon receiving phone note from [name], RN, our Head and Neck Oncology Navigator: I left a message with Dr Andrew Au staff - he is out of office today - asked for him to call me back when in office so I can help him in any way possible to facilitate work up of patient's groin mass. -----------------------------------  Eppie Gibson, MD

## 2017-07-31 ENCOUNTER — Encounter: Payer: Self-pay | Admitting: Radiation Oncology

## 2017-07-31 ENCOUNTER — Other Ambulatory Visit: Payer: Self-pay

## 2017-07-31 ENCOUNTER — Other Ambulatory Visit: Payer: Self-pay | Admitting: Family Medicine

## 2017-07-31 DIAGNOSIS — R590 Localized enlarged lymph nodes: Secondary | ICD-10-CM

## 2017-08-01 ENCOUNTER — Telehealth: Payer: Self-pay | Admitting: *Deleted

## 2017-08-01 NOTE — Telephone Encounter (Signed)
LVM informing patient that his Dec follow up needs to be rescheduled; Dr Leta Baptist will be unavailable to see him at 8 am. Advised him there are quite a few openings in Dec. Left number and requested he call back and reschedule.

## 2017-08-01 NOTE — Progress Notes (Signed)
I spoke with Dr. Marisue Humble twice since receiving word of Mr Hunger's groin symptoms....  As of today 07-31-17 the CT pelvis was denied by insurance. Dr. Marisue Humble and I spoke about him now trying to obtain a diagnostic US as he isn't convinced there is a true mass, but rather a "prominence" in the groin.  If this is not approved, Dr Marisue Humble knows I'm happy to carry the torch from here.  -----------------------------------  Eppie Gibson, MD

## 2017-08-03 ENCOUNTER — Other Ambulatory Visit: Payer: Self-pay | Admitting: *Deleted

## 2017-08-03 MED ORDER — TOPIRAMATE 25 MG PO TABS: ORAL_TABLET | ORAL | 3 refills | Status: DC

## 2017-08-04 ENCOUNTER — Other Ambulatory Visit: Payer: Self-pay | Admitting: Diagnostic Neuroimaging

## 2017-08-06 NOTE — Telephone Encounter (Signed)
Spoke with patient and informed him that Dr Leta Baptist refilled Gabapentin but wanted to touch base with him. Patient stated he is taking Gabapentin, doesn't need refill right away. He expressed appreciation for refills being sent in. This RN advised that his Dec FU needs to be rescheduled for a later time; Dr Leta Baptist will not be available for 8 am appointments. Patient stated he is not at work, stated he would call back later this week to reschedule Dec 21st appt.  He verbalized understanding of call.

## 2017-08-09 ENCOUNTER — Ambulatory Visit
Admission: RE | Admit: 2017-08-09 | Discharge: 2017-08-09 | Disposition: A | Discharge status: Home / Self Care | Payer: Managed Care, Other (non HMO) | Source: Ambulatory Visit | Attending: Family Medicine | Admitting: Family Medicine

## 2017-08-09 DIAGNOSIS — R590 Localized enlarged lymph nodes: Secondary | ICD-10-CM

## 2017-08-10 ENCOUNTER — Encounter: Payer: Self-pay | Admitting: *Deleted

## 2017-08-10 NOTE — Telephone Encounter (Signed)
Mailed letter to patient explaining his Dec FU was canceled due to provider not available for 8 am appointments any longer. Advised he call at his earliest convenience and reschedule it.

## 2017-08-13 ENCOUNTER — Other Ambulatory Visit: Payer: Self-pay | Admitting: Radiation Oncology

## 2017-08-13 DIAGNOSIS — R5381 Other malaise: Secondary | ICD-10-CM

## 2017-08-13 DIAGNOSIS — R5382 Chronic fatigue, unspecified: Secondary | ICD-10-CM

## 2017-08-14 ENCOUNTER — Telehealth: Payer: Self-pay | Admitting: *Deleted

## 2017-08-14 NOTE — Telephone Encounter (Signed)
CALLED PATIENT TO INFORM OF LAB AND FU ON 11-16-17, NO ANSWER, MAILED APPT. CARDS

## 2017-08-23 NOTE — Therapy (Signed)
[facility] [address] [place], [place], [address] Phone: [phone]   Fax:  [phone]  Patient Details  Name: [name] MRN: [id number] Date of Birth: [birth year] Referring Provider:  Eppie Gibson MD  Encounter Date: 08/23/2017  SPEECH THERAPY DISCHARGE SUMMARY  Visits from Start of Care: 3  Current functional level related to goals / functional outcomes: Pt has not been seen since October 2017 due to not scheduling further appointments.  Goals at that time were as follows:  SLP Short Term Goals - 07/21/16 1003              SLP SHORT TERM GOAL #1    Title pt will demo HEP with rare min A over 2 sessions    Time 1    Period --  visits    Status On-going         SLP SHORT TERM GOAL #2    Title pt will tell SLP why he is completing HEP over two sessions    Baseline one session 07-21-16    Time 1    Period --  visits    Status On-going         SLP SHORT TERM GOAL #3    Title pt will tell SLP 3 s/s aspiration PNA with modified independence over two sesions    Time 1    Period --  visits    Status On-going                       SLP Long Term Goals - 07/21/16 1057              SLP LONG TERM GOAL #1    Title pt will demo HEP with independence over 3 sessions    Time 4    Period --  visits    Status On-going         SLP LONG TERM GOAL #2    Title pt will tell SLP how a food journal can assist return to full WNL PO diet    Time 4    Period --  visits    Status On-going      Remaining deficits: Assumed all deficits remain as pt has not been seen in 12 months.   Education / Equipment: HEP for dysphagia, late effects rad tx on swallowing function  Plan: Patient agrees to discharge.  Patient goals were not met. Patient is being discharged due to not returning since the last visit.  ?????      [place] ,MS, CCC-SLP  08/23/2017, 9:24 AM  [facility] [address] [place], [place], [address] Phone: [phone]   Fax:  [phone]

## 2017-09-10 ENCOUNTER — Telehealth: Payer: Self-pay | Admitting: *Deleted

## 2017-09-10 NOTE — Telephone Encounter (Signed)
Called and spoke with patient, scheduled follow up with Dr. Mickeal Skinner.  Confirmed appointment for Dr. Mickeal Skinner and f/u with Dr Isidore Moos while on phone.  Mailed calender to patients home address.  COnfirmed correct.

## 2017-10-02 ENCOUNTER — Telehealth: Payer: Self-pay | Admitting: Internal Medicine

## 2017-10-02 ENCOUNTER — Other Ambulatory Visit: Payer: Self-pay

## 2017-10-02 ENCOUNTER — Encounter: Payer: Self-pay | Admitting: Internal Medicine

## 2017-10-02 ENCOUNTER — Ambulatory Visit (HOSPITAL_BASED_OUTPATIENT_CLINIC_OR_DEPARTMENT_OTHER): Payer: Managed Care, Other (non HMO) | Admitting: Internal Medicine

## 2017-10-02 VITALS — BP 126/73 | HR 76 | Temp 97.8°F | Resp 18 | Ht 71.0 in | Wt 148.8 lb

## 2017-10-02 DIAGNOSIS — C090 Malignant neoplasm of tonsillar fossa: Secondary | ICD-10-CM

## 2017-10-02 DIAGNOSIS — G540 Brachial plexus disorders: Secondary | ICD-10-CM

## 2017-10-02 DIAGNOSIS — W888XXA Exposure to other ionizing radiation, initial encounter: Principal | ICD-10-CM

## 2017-10-02 MED ORDER — DEXAMETHASONE 4 MG PO TABS: 4.0000 mg | ORAL_TABLET | Freq: Every day | ORAL | 0 refills | Status: DC

## 2017-10-02 NOTE — Progress Notes (Signed)
[facility] [place], [place] [address] [phone]   Interval Evaluation  Date of Service: 10/02/17 Patient Name: [name] Patient MRN: [id number] Patient DOB: 1975-11-07 Provider: Ventura Sellers, MD  Identifying Statement:  [name] is a 41 y.o. male with Radiation-induced brachial plexopathy [G54.0, W88.8XXA]   Referring Provider: Alroy Dust, L.Marlou Sa, [place] [employer] [place], [place] [address]  Primary Cancer: Squamous cell tonsillar  Oncologic History:   Cancer of tonsillar fossa ([facility])   03/03/2016 Imaging    CT neck: Left jugulodigastric adenopathy correlating with palpable complaint.  Tonsils symmetrically prominent for age.       03/27/2016 Initial Biopsy    Left neck mass (consult slides), fine needle aspirate: Malignant cells present, favor squamous cell carcinoma.       04/07/2016 PET scan    Solitary (L) level 2 LN enlarged and exhibits malignant range FDG uptake.  Symmetric uptake in both tonsillar regions. No definite findings identified to suggest primary site of disease. No evidence of metastatic disease to chest, abdomen, pelvis, or bony structures.       04/14/2016 Procedure    Biopsy of left tonsil: (+) invasive squamous cell carcinoma, p16+.   Biopsies of nasopharynx x 4 (benign), base of tongue x 2 (benign), and left oropharynx (benign).       04/21/2016 Initial Diagnosis    Cancer of tonsillar fossa ([facility])      05/22/2016 - 07/05/2016 Radiation Therapy    Tonsils and bilateral neck / 70 Gy in 35 fractions to gross disease, 63 Gy in 35 fractions to high risk nodal echelons, and 56 Gy in 35 fractions to intermediate risk nodal echelons       Interval History:  [name] presents today for follow up.  He did feel the decadron prescribed at our previous visit helped for some time.  He did discontinue after ~10 days as advised.  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  The upcoming holiday season is particularly strenuous with long hours. He otherwise denies seizures, visual disturbance or gait impairment.    Medications: Current Outpatient Medications on File Prior to Visit  Medication Sig Dispense Refill  . acetaminophen (TYLENOL) 325 MG tablet Take 650 mg by mouth every 6 (six) hours as needed.    . citalopram (CELEXA) 20 MG tablet Take 1 tablet (20 mg total) by mouth daily. (Patient not taking: Reported on 07/12/2017) 30 tablet 5  . gabapentin (NEURONTIN) 300 MG capsule TAKE 1 CAPSULE (300 MG TOTAL) BY MOUTH 2 (TWO) TIMES DAILY. (Patient not taking: Reported on 10/02/2017) 60 capsule 6  . pentoxifylline (TRENTAL) 400 MG CR tablet Take 400mg  tab daily x 1 week, then 400mg  BID; take with food (Patient not taking: Reported on 10/02/2017) 60 tablet 5  . ranitidine (ZANTAC) 150 MG capsule Take 150 mg by mouth as needed for heartburn.    . sodium fluoride (FLUORISHIELD) 1.1 % GEL dental gel Instill one drop of gel per tooth space of fluoride tray. Place over teeth for 5 minutes. Remove. Spit out excess. Repeat nightly. (Patient not taking: Reported on 10/02/2017) 120 mL prn  . topiramate (TOPAMAX) 25 MG tablet TAKE 2 TABLETS AT BEDTIME (Patient not taking: Reported on 10/02/2017) 180 tablet 3  . vitamin E 400 UNIT capsule Take 400  IU daily x 1 week, then 400 IU BID (Patient not taking: Reported on 10/02/2017) 60 capsule 5   No current facility-administered medications on file prior to visit.     Allergies: No Known Allergies Past Medical History:  Past Medical History:  Diagnosis Date  . Back injury due to skiing accident 2004   jet ski accident  . Cancer [facility])    tosillar, s/p radiation therapy  . GERD (gastroesophageal reflux disease)   . History of radiation therapy 05/22/2016 - 07/05/2016   Tonsils and bilateral neck  70 Gy 35 fractions to gross disease, 63 Gy 35 fractions to high risk nodal echelons, 56 Gy 35 fractions to intermediate risk nodal echelons  . Mitral valve prolapse   . Weight loss    Past Surgical History:  Past Surgical History:  Procedure Laterality Date  . ELBOW SURGERY Right   . KNEE SURGERY Left   . NASOPHARYNGOSCOPY Left 04/14/2016   Procedure: BASE NASAL NASOPHARYNX;  Surgeon: Jerrell Belfast, MD;  Location: [facility];  Service: ENT;  Laterality: Left;  . TONSILLECTOMY Left 04/14/2016   Procedure: TONSILLECTOMY AND BIOPSY OF TONGUE ;  Surgeon: Jerrell Belfast, MD;  Location: [facility];  Service: ENT;  Laterality: Left;   Social History:  Social History   Socioeconomic History  . Marital status: Married    Spouse name: Not on file  . Number of children: 0  . Years of education: 5  . Highest education level: Not on file  Social Needs  . Financial resource strain: Not on file  . Food insecurity - worry: Not on file  . Food insecurity - inability: Not on file  . Transportation needs - medical: Not on file  . Transportation needs - non-medical: Not on file  Occupational History    Comment: [employer]  Tobacco Use  . Smoking status: Never Smoker  . Smokeless tobacco: Former Network engineer and Sexual Activity  . Alcohol use: Yes    Alcohol/week: 0.0 oz    Comment: social: Research scientist (life sciences) at [employer]  . Drug use: No  . Sexual activity: Not on file  Other Topics Concern  . Not on file  Social History Narrative   Lives with wife   caffeine , rare   Family History:  Family History  Problem Relation Age of Onset  . Cancer Mother        Breast  . Cancer Father        Lung  . Cancer Brother        brain  . Cancer Maternal Grandfather        leukemia    Review of Systems: Constitutional: Denies fevers, chills or abnormal weight loss Eyes: Denies blurriness of vision Ears, nose, mouth, throat,  and face: Denies mucositis or sore throat Respiratory: Denies cough, dyspnea or wheezes Cardiovascular: Denies palpitation, chest discomfort or lower extremity swelling Gastrointestinal:  Denies nausea, constipation, diarrhea GU: Denies dysuria or incontinence Skin: Denies abnormal skin rashes Neurological: Per HPI Musculoskeletal: +back pain Behavioral/Psych: ++stress   Physical Exam: Vitals:   10/02/17 0942  BP: 126/73  Pulse: 76  Resp: 18  Temp: 97.8 F (36.6 C)  SpO2: 100%   KPS: 80. General: Alert, cooperative, pleasant, in no acute distress Head: Craniotomy scar noted, dry and intact. EENT: No conjunctival injection or scleral icterus. Oral mucosa moist Lungs: Resp effort normal Cardiac: Regular rate and rhythm Abdomen: Soft, non-distended abdomen Skin: No rashes cyanosis or  petechiae. Extremities: No clubbing or edema  Neurologic Exam: Mental Status: Awake, alert, attentive to examiner. Oriented to self and environment. Language is fluent with intact comprehension.  Cranial Nerves: Visual acuity is grossly normal. Visual fields are full. Extra-ocular movements intact. No ptosis. Face is symmetric, tongue midline. Motor: Tone and bulk are normal. Power is full in both arms and legs, although distally there is subtle weakness affecting left hand. Unable to obtain brachiaradialis, biceps or triceps reflexes in the left arm.  Otherwise no pathologic reflexes present. Intact finger to nose bilaterally Sensory: Impaired to light touch along left arm and hand medially Gait: Normal and tandem gait is normal.   Labs: I have reviewed the data as listed    Component Value Date/Time   NA 140 05/25/2017 1030   NA 139 08/21/2016 1405   K 4.7 05/25/2017 1030   K 4.5 08/21/2016 1405   CL 104 05/25/2017 1030   CL 103 10/22/2012 0146   CO2 30 05/25/2017 1030   CO2 29 08/21/2016 1405   GLUCOSE 83 05/25/2017 1030   GLUCOSE 93 08/21/2016 1405   BUN 16 05/25/2017 1030   BUN  10.8 08/21/2016 1405   CREATININE 1.03 05/25/2017 1030   CREATININE 0.8 08/21/2016 1405   CALCIUM 9.8 05/25/2017 1030   CALCIUM 9.9 08/21/2016 1405   PROT 7.3 05/25/2017 1030   PROT 7.3 08/21/2016 1405   ALBUMIN 4.7 05/25/2017 1030   ALBUMIN 4.2 08/21/2016 1405   AST 14 (L) 05/25/2017 1030   AST 15 08/21/2016 1405   ALT 10 (L) 05/25/2017 1030   ALT 12 08/21/2016 1405   ALKPHOS 40 05/25/2017 1030   ALKPHOS 48 08/21/2016 1405   BILITOT 2.6 (H) 05/25/2017 1030   BILITOT 2.17 (H) 08/21/2016 1405   GFRNONAA >60 05/25/2017 1030   GFRNONAA >60 10/22/2012 0146   GFRAA >60 05/25/2017 1030   GFRAA >60 10/22/2012 0146   Lab Results  Component Value Date   WBC 3.2 (L) 06/07/2017   NEUTROABS 1.7 05/25/2017   HGB 13.7 06/07/2017   HCT 38.9 (L) 06/07/2017   MCV 85.9 06/07/2017   PLT 191 06/07/2017    Assessment/Plan 1. Radiation-induced brachial plexopathy  [name] is clinically stable from his plexopathy. He has discontinued Vitamin E and Trental.  He did request an additional course of steroids due to upcoming physical/work stressor.  4mg  Decadron, to be dosed daily, was ordered for to his pharmacy.  We have recommended a 10 day course of treatment.  He has upcoming re-staging of his primary tonsillar cancer.  He should follow up with [place] in 4 months, or sooner if needed.  2. Cognitive changes Noted, stable.  We appreciate the opportunity to participate in the care of [name].   All questions were answered. The patient knows to call the clinic with any problems, questions or concerns. No barriers to learning were detected.  The total time spent in the appointment was 25 minutes and more than 50% was on counseling and review of test results  Ventura Sellers, MD Medical Director of Neuro-Oncology [facility] [place] 10/02/17 3:18 PM

## 2017-10-02 NOTE — Telephone Encounter (Signed)
Gave patient avs and calendar with appts per 12/18 los.  °

## 2017-10-05 ENCOUNTER — Ambulatory Visit: Payer: Managed Care, Other (non HMO) | Admitting: Diagnostic Neuroimaging

## 2017-10-16 HISTORY — PX: GALLBLADDER SURGERY: SHX652

## 2017-10-16 HISTORY — PX: HERNIA REPAIR: SHX51

## 2017-11-12 ENCOUNTER — Encounter: Payer: Self-pay | Admitting: Radiation Oncology

## 2017-11-12 NOTE — Progress Notes (Signed)
[name] presents for follow up of radiation completed 07/05/16 to his tonsils and bilateral neck   Pain issues, if any: He reports a chronic headache daily which he rates a 2/10, He report pain to his left collar bone area when he raises his arm. He gets occasional cramp like feeling to his neck, but these have improved.  Using a feeding tube?: No Weight changes, if any:  Wt Readings from Last 3 Encounters:  11/16/17 152 lb 6.4 oz (69.1 kg)  10/02/17 148 lb 12.8 oz (67.5 kg)  07/12/17 145 lb (65.8 kg)   Swallowing issues, if any: Food/pills will get stuck in his throat.  Smoking or chewing tobacco? No Using fluoride trays daily? Occasionally Last ENT visit was on: Dr. Conley Canal [facility] Other notable issues, if any:  He reports having a sonagram to his groin area which showed enlarged gland. He does not have pain in this area at this time.  Dr. Mickeal Skinner 10/02/17 next 01/29/18  07/04/17 Left neck dissection, Dr. Conley Canal [facility] 10/02/17 CT Neck Results:   CONCLUSION: 1. Interval modified radical left neck dissection. No evidence of residual or recurrent disease. 2. No pathologically enlarged or morphologically suspicious lymph nodes in the neck.  BP 126/77   Pulse 89   Temp 98.3 F (36.8 C)   Wt 152 lb 6.4 oz (69.1 kg)   SpO2 100% Comment: room air  BMI 21.26 kg/m

## 2017-11-16 ENCOUNTER — Ambulatory Visit
Admission: RE | Admit: 2017-11-16 | Discharge: 2017-11-16 | Disposition: A | Discharge status: Home / Self Care | Payer: Managed Care, Other (non HMO) | Source: Ambulatory Visit | Attending: Radiation Oncology | Admitting: Radiation Oncology

## 2017-11-16 ENCOUNTER — Encounter: Payer: Self-pay | Admitting: Radiation Oncology

## 2017-11-16 ENCOUNTER — Encounter: Payer: Self-pay | Admitting: *Deleted

## 2017-11-16 VITALS — BP 126/77 | HR 89 | Temp 98.3°F | Wt 152.4 lb

## 2017-11-16 DIAGNOSIS — R200 Anesthesia of skin: Secondary | ICD-10-CM | POA: Diagnosis not present

## 2017-11-16 DIAGNOSIS — Z08 Encounter for follow-up examination after completed treatment for malignant neoplasm: Secondary | ICD-10-CM | POA: Insufficient documentation

## 2017-11-16 DIAGNOSIS — Z923 Personal history of irradiation: Secondary | ICD-10-CM | POA: Insufficient documentation

## 2017-11-16 DIAGNOSIS — Z79899 Other long term (current) drug therapy: Secondary | ICD-10-CM | POA: Insufficient documentation

## 2017-11-16 DIAGNOSIS — R5381 Other malaise: Secondary | ICD-10-CM

## 2017-11-16 DIAGNOSIS — Z85818 Personal history of malignant neoplasm of other sites of lip, oral cavity, and pharynx: Secondary | ICD-10-CM | POA: Diagnosis present

## 2017-11-16 DIAGNOSIS — M79602 Pain in left arm: Secondary | ICD-10-CM | POA: Insufficient documentation

## 2017-11-16 DIAGNOSIS — R5382 Chronic fatigue, unspecified: Secondary | ICD-10-CM

## 2017-11-16 DIAGNOSIS — I890 Lymphedema, not elsewhere classified: Secondary | ICD-10-CM | POA: Diagnosis not present

## 2017-11-16 DIAGNOSIS — M545 Low back pain: Secondary | ICD-10-CM | POA: Diagnosis not present

## 2017-11-16 DIAGNOSIS — C090 Malignant neoplasm of tonsillar fossa: Secondary | ICD-10-CM

## 2017-11-16 LAB — TSH: TSH: 1.878 u[IU]/mL (ref 0.320–4.118)

## 2017-11-16 NOTE — Progress Notes (Signed)
Radiation Oncology         [phone] ________________________________  Name: [name] MRN: [id number]  Date: 11/16/2017  DOB: 1976/08/14  Follow-Up Visit  Note  CC: Alroy Dust, L.Marlou Sa, MD  Jerrell Belfast, MD  Diagnosis and Prior Radiotherapy:     ICD-10-CM   1. 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  Narrative:  The patient returns today for routine follow-up of definitive radiation completed to his tonsils and bilateral neck on 07/05/16. He reports that he is doing well overall. He has a follow up with Dr. Mickeal Skinner in April 2019 and an appointment at [facility] every 6 months alternating CT and PET scan.   He underwent CT soft tissue neck thyroid with contrast on 10/02/17 at [facility] with results showing post surgical changes with no evidence of cancer.   His PCP ordered an [place] of his groin in October, 2018 for a palpable area s bilaterally, but the study showed no significant abnormalities.   10/02/17 evaluated by Dr. Mickeal Skinner and his brachial plexopathy felt to be clinically stable. He continues to receives steroids PRN from Dr. Mickeal Skinner. Cognitive changes were thought to be stable.   On review of systems, he reports improving occasional HA, intermittent neck cramping, left sided arm numbness, increased fatigue, left arm muscle atrophy, left arm pain with exertion, and improving lower back pain. He stopped taking Gabapentin and Topamax following surgery. He reports that within a quarter of a mile in while on the elliptical, his left arm  goes numb. He reports that he has been dropping items due to his left arm numbness. He reports that he notices the left arm numbness at night severely. He often has issues with raising his arm on the steering wheel after a long day.  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    ALLERGIES:  has No Known Allergies.  Meds: Current Outpatient Medications  Medication Sig Dispense Refill  . acetaminophen (TYLENOL) 325 MG tablet Take 650 mg by mouth every 6 (six) hours as needed.    . sodium fluoride (FLUORISHIELD) 1.1 % GEL dental gel Instill one drop of gel per tooth space of fluoride tray. Place over teeth for 5 minutes. Remove. Spit out excess. Repeat nightly. 120 mL prn  . citalopram (CELEXA) 20 MG tablet Take 1 tablet (20 mg total) by mouth daily. (Patient not taking: Reported on 07/12/2017) 30 tablet 5  . dexamethasone (DECADRON) 4 MG tablet Take 1 tablet (4 mg total) by mouth daily. (Patient not taking: Reported on 11/16/2017) 10 tablet 0  . gabapentin (NEURONTIN) 300 MG capsule TAKE 1 CAPSULE (300 MG TOTAL) BY MOUTH 2 (TWO) TIMES DAILY. (Patient not taking: Reported on 10/02/2017) 60 capsule 6  . pentoxifylline (TRENTAL) 400 MG CR tablet Take 400mg  tab daily x 1 week, then 400mg  BID; take with food (Patient not taking: Reported on 10/02/2017) 60 tablet 5  . ranitidine (ZANTAC) 150 MG capsule Take 150 mg by mouth as needed for heartburn.    . topiramate (TOPAMAX) 25 MG tablet TAKE  2 TABLETS AT BEDTIME (Patient not taking: Reported on 10/02/2017) 180 tablet 3  . vitamin E 400 UNIT capsule Take 400 IU daily x 1 week, then 400 IU BID (Patient not taking: Reported on 10/02/2017) 60 capsule 5   No current facility-administered medications for this encounter.     Physical Findings: The patient is in no acute distress. Patient is alert and oriented. Wt Readings from Last 3 Encounters:  11/16/17 152 lb 6.4 oz (69.1 kg)  10/02/17 148 lb 12.8 oz (67.5 kg)    07/12/17 145 lb (65.8 kg)    weight is 152 lb 6.4 oz (69.1 kg). His temperature is 98.3 F (36.8 C). His blood pressure is 126/77 and his pulse is 89. His oxygen saturation is 100%. .   General: Alert and oriented, in no acute distress HEENT: Head is normocephalic. Extraocular movements are intact. Oropharynx is clear, tongue deviates very slightly to the left. Oral cavity is clear.  Neck: Neck is notable for mild lymphedema anteriorly. No palpable mass in the cervical or supraclavicular regions.  Skin: Skin in treatment fields shows satisfactory healing. Surgical scar healed, right neck Heart: Regular in rate and rhythm with no murmurs, rubs, or gallops. Chest: Clear to auscultation bilaterally, with no rhonchi, wheezes, or rales. Extremities: No cyanosis or edema. Psychiatric: Judgment and insight are intact. Affect is appropriate. Neurologic exam: On upper body strength, I am only able to appreciate slight weakness in left shoulder abduction  Atrophy of left trapezius.     Lab Findings: Lab Results  Component Value Date   WBC 3.2 (L) 06/07/2017   HGB 13.7 06/07/2017   HCT 38.9 (L) 06/07/2017   MCV 85.9 06/07/2017   PLT 191 06/07/2017    Lab Results  Component Value Date   TSH 1.878 11/16/2017    Radiographic Findings: No results found.  Impression/Plan:    1) Head and Neck Cancer Status: No evidence of disease. Healed well from salvage surgery. Advised that I would refer the patient to Physical Therapy for the lymphedema clinic services, to which he noted that he would like to hold off at this time. I then taught the patient how to complete lymphedema massage therapy at home.   2) Nutritional Status: No feeding tube, stable  3) Dental: Encouraged to continue regular followup with dentistry, and dental hygiene including fluoride rinses.  4) Thyroid function: WNL Lab Results  Component Value Date   TSH 1.878 11/16/2017    No orders of the defined types were placed in  this encounter.   5) Follow-up in 01/29/2018 months with Dr Mickeal Skinner to evaluate further.  Follow up in Radiation Oncology in June 2019.  Imaging per ENT at [facility].  I spent 20 minutes face to face with the patient and more than 50% of that time was spent in counseling and/or coordination of care. _____________________________________   Eppie Gibson, MD  This document serves as a record of services personally performed by Eppie Gibson, MD. It was created on her behalf by Steva Colder, a trained medical scribe. The creation of this record is based on the scribe's personal observations and the provider's statements to them. This document has been checked and approved by the attending provider.

## 2017-11-19 ENCOUNTER — Encounter: Payer: Self-pay | Admitting: Radiation Oncology

## 2017-11-19 NOTE — Progress Notes (Signed)
Oncology Nurse Navigator Documentation  To provide care continuity, met with pt during follow-up with Dr. Isidore Moos.   He reported energy level and taste sensations are now close to baseline. He reported follow-up with Dr. Conley Canal [facility] and Dr. Sharman Cheek in April. He denied needs/concerns.  I encouraged him to call should that change.  [name], RN, BSN Head & Neck Oncology Nurse [facility] [place] [phone]

## 2017-12-06 ENCOUNTER — Encounter: Payer: Self-pay | Admitting: Internal Medicine

## 2017-12-06 ENCOUNTER — Inpatient Hospital Stay: Payer: Managed Care, Other (non HMO) | Attending: Internal Medicine | Admitting: Internal Medicine

## 2017-12-06 VITALS — BP 130/81 | HR 66 | Temp 98.6°F | Resp 20 | Ht 71.0 in | Wt 155.7 lb

## 2017-12-06 DIAGNOSIS — G540 Brachial plexus disorders: Secondary | ICD-10-CM | POA: Insufficient documentation

## 2017-12-06 DIAGNOSIS — R634 Abnormal weight loss: Secondary | ICD-10-CM | POA: Insufficient documentation

## 2017-12-06 DIAGNOSIS — R51 Headache: Secondary | ICD-10-CM | POA: Diagnosis not present

## 2017-12-06 DIAGNOSIS — W888XXD Exposure to other ionizing radiation, subsequent encounter: Secondary | ICD-10-CM | POA: Insufficient documentation

## 2017-12-06 DIAGNOSIS — Z79899 Other long term (current) drug therapy: Secondary | ICD-10-CM | POA: Insufficient documentation

## 2017-12-06 DIAGNOSIS — R5383 Other fatigue: Secondary | ICD-10-CM | POA: Diagnosis not present

## 2017-12-06 DIAGNOSIS — K219 Gastro-esophageal reflux disease without esophagitis: Secondary | ICD-10-CM | POA: Insufficient documentation

## 2017-12-06 DIAGNOSIS — I341 Nonrheumatic mitral (valve) prolapse: Secondary | ICD-10-CM | POA: Insufficient documentation

## 2017-12-06 DIAGNOSIS — C090 Malignant neoplasm of tonsillar fossa: Secondary | ICD-10-CM | POA: Diagnosis not present

## 2017-12-06 MED ORDER — DEXAMETHASONE 4 MG PO TABS: 4.0000 mg | ORAL_TABLET | Freq: Every day | ORAL | 0 refills | Status: DC

## 2017-12-06 MED ORDER — CYCLOBENZAPRINE HCL 5 MG PO TABS: 5.0000 mg | ORAL_TABLET | Freq: Three times a day (TID) | ORAL | 0 refills | Status: DC | PRN

## 2017-12-06 NOTE — Progress Notes (Signed)
[facility] at [place], [place] [address] [phone]   Interval Evaluation  Date of Service: 12/06/17 Patient Name: [name] Patient MRN: [id number] Patient DOB: [birth year] Provider: Ventura Sellers, MD  Identifying Statement:  [name] is a 42 y.o. male with Other fatigue [R53.83]   Referring Provider: Alroy Dust, L.Marlou Sa, [place] [address] [place], [place] [address]  Primary Cancer: Squamous cell tonsillar  Oncologic History:   Cancer of tonsillar fossa ([place])   03/03/2016 Imaging    CT neck: Left jugulodigastric adenopathy correlating with palpable complaint.  Tonsils symmetrically prominent for age.       03/27/2016 Initial Biopsy    Left neck mass (consult slides), fine needle aspirate: Malignant cells present, favor squamous cell carcinoma.       04/07/2016 PET scan    Solitary (L) level 2 LN enlarged and exhibits malignant range FDG uptake.  Symmetric uptake in both tonsillar regions. No definite findings identified to suggest primary site of disease. No evidence of metastatic disease to chest, abdomen, pelvis, or bony structures.       04/14/2016 Procedure    Biopsy of left tonsil: (+) invasive squamous cell carcinoma, p16+.   Biopsies of nasopharynx x 4 (benign), base of tongue x 2 (benign), and left oropharynx (benign).       04/21/2016 Initial Diagnosis    Cancer of tonsillar fossa ([facility])      05/22/2016 - 07/05/2016 Radiation Therapy    Tonsils and bilateral neck / 70 Gy in 35 fractions to gross disease, 63 Gy in 35 fractions to high risk nodal echelons, and 56 Gy in 35 fractions to intermediate risk nodal echelons       Interval History:  [name] presents today for follow up.  He complain of low energy, fatigue, increased sleep volume and unintentional weight gain ~10 lbs over the past 2-3 weeks.  At last visit with Dr. Isidore Moos on 11/16/17 he was not having these symptoms.  He continues to  have cramps in his left arm, neck and face which occasionally can be quite painful.  The steroids always do help, but he has not used any other medications for the spasms/cramps.  Finally, he complains today of a recurring headache on the posterior left aspect of his head.  This pain can be precipitated by turning his head to the side, and has a lancinating quality.    Medications: Current Outpatient Medications on File Prior to Visit  Medication Sig Dispense Refill  . acetaminophen (TYLENOL) 325 MG tablet Take 650 mg by mouth every 6 (six) hours as needed.    . citalopram (CELEXA) 20 MG tablet Take 1 tablet (20 mg total) by mouth daily. (Patient not taking: Reported on 07/12/2017) 30 tablet 5  . gabapentin (NEURONTIN) 300 MG capsule TAKE 1 CAPSULE (300 MG TOTAL) BY MOUTH 2 (TWO) TIMES DAILY. (Patient not taking: Reported on 10/02/2017) 60 capsule 6  . pentoxifylline (TRENTAL) 400 MG CR tablet Take 400mg  tab daily x 1 week, then 400mg  BID; take with food (Patient not taking: Reported on 10/02/2017) 60 tablet 5  . ranitidine (ZANTAC) 150 MG capsule Take 150 mg by mouth as needed for heartburn.    . sodium fluoride (FLUORISHIELD) 1.1 % GEL dental gel Instill one drop of gel per tooth space of fluoride tray. Place over teeth for 5 minutes. Remove. Spit out excess. Repeat nightly. (Patient not taking: Reported on 12/06/2017) 120 mL prn  .  topiramate (TOPAMAX) 25 MG tablet TAKE 2 TABLETS AT BEDTIME (Patient not taking: Reported on 10/02/2017) 180 tablet 3  . vitamin E 400 UNIT capsule Take 400 IU daily x 1 week, then 400 IU BID (Patient not taking: Reported on 10/02/2017) 60 capsule 5   No current facility-administered medications on file prior to visit.     Allergies: No Known Allergies Past Medical History:  Past Medical History:  Diagnosis Date  . Back injury due to skiing accident 2004   jet ski accident  . Cancer [facility])    tosillar, s/p radiation therapy  . GERD (gastroesophageal reflux  disease)   . History of radiation therapy 05/22/2016 - 07/05/2016   Tonsils and bilateral neck 70 Gy 35 fractions to gross disease, 63 Gy 35 fractions to high risk nodal echelons, 56 Gy 35 fractions to intermediate risk nodal echelons  . Mitral valve prolapse   . Weight loss    Past Surgical History:  Past Surgical History:  Procedure Laterality Date  . ELBOW SURGERY Right   . KNEE SURGERY Left   . NASOPHARYNGOSCOPY Left 04/14/2016   Procedure: BASE NASAL NASOPHARYNX;  Surgeon: Jerrell Belfast, MD;  Location: [facility];  Service: ENT;  Laterality: Left;  . NECK DISSECTION Left 07/04/2017   Dr. Conley Canal, [place] Left 04/14/2016   Procedure: TONSILLECTOMY AND BIOPSY OF TONGUE ;  Surgeon: Jerrell Belfast, MD;  Location: [facility];  Service: ENT;  Laterality: Left;   Social History:  Social History   Socioeconomic History  . Marital status: Married    Spouse name: Not on file  . Number of children: 0  . Years of education: 41  . Highest education level: Not on file  Social Needs  . Financial resource strain: Not on file  . Food insecurity - worry: Not on file  . Food insecurity - inability: Not on file  . Transportation needs - medical: Not on file  . Transportation needs - non-medical: Not on file  Occupational History    Comment: [employer]  Tobacco Use  . Smoking status: Never Smoker  . Smokeless tobacco: Former Network engineer and Sexual Activity  . Alcohol use: Yes    Alcohol/week: 0.0 oz    Comment: social: Research scientist (life sciences) at [employer]  . Drug use: No  . Sexual activity: Not on file  Other Topics Concern  . Not on file  Social History Narrative   Lives with wife   caffeine , rare   Family History:  Family History  Problem Relation Age of Onset  . Cancer Mother        Breast  . Cancer Father        Lung  . Cancer Brother        brain  . Cancer Maternal  Grandfather        leukemia    Review of Systems: Constitutional: Denies fevers, chills or abnormal weight loss Eyes: Denies blurriness of vision Ears, nose, mouth, throat, and face: Denies mucositis or sore throat Respiratory: Denies cough, dyspnea or wheezes Cardiovascular: Denies palpitation, chest discomfort or lower extremity swelling Gastrointestinal:  Denies nausea, constipation, diarrhea GU: Denies dysuria or incontinence Skin: Denies abnormal skin rashes Neurological: Per HPI Musculoskeletal: +back pain Behavioral/Psych: ++stress   Physical Exam: Vitals:   12/06/17 1028  BP: 130/81  Pulse: 66  Resp: 20  Temp: 98.6 F (37 C)  SpO2: 100%   KPS: 80.  General: Alert, cooperative, pleasant, in no acute distress Head: Craniotomy scar noted, dry and intact. EENT: No conjunctival injection or scleral icterus. Oral mucosa moist Lungs: Resp effort normal Cardiac: Regular rate and rhythm Abdomen: Soft, non-distended abdomen Skin: No rashes cyanosis or petechiae. Extremities: No clubbing or edema  Neurologic Exam: Mental Status: Awake, alert, attentive to examiner. Oriented to self and environment. Language is fluent with intact comprehension.  Cranial Nerves: Visual acuity is grossly normal. Visual fields are full. Extra-ocular movements intact. No ptosis. Face is symmetric, tongue midline. Motor: Tone and bulk are normal. Power is full in both arms and legs, although distally there is subtle weakness affecting left hand. Unable to obtain brachiaradialis, biceps or triceps reflexes in the left arm.  Otherwise no pathologic reflexes present. Intact finger to nose bilaterally Sensory: Impaired to light touch along left arm and hand medially Gait: Normal and tandem gait is normal.   Labs: I have reviewed the data as listed    Component Value Date/Time   NA 140 05/25/2017 1030   NA 139 08/21/2016 1405   K 4.7 05/25/2017 1030   K 4.5 08/21/2016 1405   CL 104 05/25/2017  1030   CL 103 10/22/2012 0146   CO2 30 05/25/2017 1030   CO2 29 08/21/2016 1405   GLUCOSE 83 05/25/2017 1030   GLUCOSE 93 08/21/2016 1405   BUN 16 05/25/2017 1030   BUN 10.8 08/21/2016 1405   CREATININE 1.03 05/25/2017 1030   CREATININE 0.8 08/21/2016 1405   CALCIUM 9.8 05/25/2017 1030   CALCIUM 9.9 08/21/2016 1405   PROT 7.3 05/25/2017 1030   PROT 7.3 08/21/2016 1405   ALBUMIN 4.7 05/25/2017 1030   ALBUMIN 4.2 08/21/2016 1405   AST 14 (L) 05/25/2017 1030   AST 15 08/21/2016 1405   ALT 10 (L) 05/25/2017 1030   ALT 12 08/21/2016 1405   ALKPHOS 40 05/25/2017 1030   ALKPHOS 48 08/21/2016 1405   BILITOT 2.6 (H) 05/25/2017 1030   BILITOT 2.17 (H) 08/21/2016 1405   GFRNONAA >60 05/25/2017 1030   GFRNONAA >60 10/22/2012 0146   GFRAA >60 05/25/2017 1030   GFRAA >60 10/22/2012 0146   Lab Results  Component Value Date   WBC 3.2 (L) 06/07/2017   NEUTROABS 1.7 05/25/2017   HGB 13.7 06/07/2017   HCT 38.9 (L) 06/07/2017   MCV 85.9 06/07/2017   PLT 191 06/07/2017    Assessment/Plan 1. Radiation-induced brachial plexopathy  Mr. Cheema is clinically stable from his plexopathy.  He does complain of new type of headache today which is clinically consistent with occipital neuralgia.  Gabapentin, which he is no longer taking, never had efficacy.  He may benefit from local blockade of greater occipital nerve, will refer to Dr. Clydell Hakim of [facility].  Will order PRN flexeril 5mg  for spasms/cramping.  2. Cognitive changes  There has been a decline in energy, increase in fatigue, and unintentional weight gain in the past 3 weeks.  Will recheck TSH, and additionally B12/folate, cortisol, testosterone.  Will re-dose with short term decadron as well.   If workup unremarkable can consider low dose of a stimulant such as Ritalin.  We appreciate the opportunity to participate in the care of Javius Sylla.   All questions were answered. The patient knows to call the clinic  with any problems, questions or concerns. No barriers to learning were detected.  The total time spent in the appointment was 40 minutes and more than 50% was on counseling and review of test  results  Ventura Sellers, MD Medical Director of Neuro-Oncology [facility] at [place] 12/06/17 2:32 PM

## 2017-12-07 ENCOUNTER — Telehealth: Payer: Self-pay

## 2017-12-07 NOTE — Telephone Encounter (Signed)
No los per 2/21. °

## 2017-12-21 ENCOUNTER — Inpatient Hospital Stay: Payer: Managed Care, Other (non HMO) | Attending: Internal Medicine

## 2017-12-21 DIAGNOSIS — G540 Brachial plexus disorders: Secondary | ICD-10-CM | POA: Diagnosis not present

## 2017-12-21 DIAGNOSIS — R5383 Other fatigue: Secondary | ICD-10-CM

## 2017-12-21 LAB — TSH: TSH: 2.631 u[IU]/mL (ref 0.320–4.118)

## 2017-12-22 LAB — TESTOSTERONE: TESTOSTERONE: 237 ng/dL — AB (ref 264–916)

## 2017-12-27 ENCOUNTER — Telehealth: Payer: Self-pay | Admitting: *Deleted

## 2017-12-27 NOTE — Telephone Encounter (Signed)
Patient called about test results and wanted to know if any were abnormal.  Per Vaslow he would consider ordering a trial dose of Ritalin for the patient to see if it helped improve his fatigue.  He also wanted to inform Vaslow that he has been having spells of spasms up his neck into the base of his head with severe pain and inability to move his head during these episodes.  Per Vaslow instructed patient to try Benadryl 25 mg for the dystonia episodes and to see if it helps.  Patient aware and willing to try.

## 2017-12-28 ENCOUNTER — Other Ambulatory Visit: Payer: Self-pay | Admitting: Internal Medicine

## 2017-12-28 MED ORDER — METHYLPHENIDATE HCL 5 MG PO TABS: 5.0000 mg | ORAL_TABLET | Freq: Two times a day (BID) | ORAL | 0 refills | Status: DC

## 2018-01-29 ENCOUNTER — Ambulatory Visit: Payer: Self-pay | Admitting: Internal Medicine

## 2018-01-30 ENCOUNTER — Ambulatory Visit: Payer: Self-pay | Admitting: Internal Medicine

## 2018-01-30 ENCOUNTER — Telehealth: Payer: Self-pay | Admitting: *Deleted

## 2018-01-30 NOTE — Telephone Encounter (Signed)
Call received from [facility] in reference to "I need to speak with Dr. Renda Rolls nurse for a refill".  Call transferred to Ext. [phone] for help with this matter.

## 2018-01-31 ENCOUNTER — Other Ambulatory Visit: Payer: Self-pay | Admitting: Internal Medicine

## 2018-01-31 ENCOUNTER — Telehealth: Payer: Self-pay | Admitting: Medical Oncology

## 2018-01-31 MED ORDER — METHYLPHENIDATE HCL 5 MG PO TABS: 5.0000 mg | ORAL_TABLET | Freq: Two times a day (BID) | ORAL | 0 refills | Status: DC

## 2018-01-31 NOTE — Telephone Encounter (Signed)
Ritalin refill requested.

## 2018-02-08 ENCOUNTER — Telehealth: Payer: Self-pay | Admitting: Internal Medicine

## 2018-02-08 ENCOUNTER — Encounter: Payer: Self-pay | Admitting: Internal Medicine

## 2018-02-08 ENCOUNTER — Inpatient Hospital Stay: Payer: Managed Care, Other (non HMO) | Attending: Internal Medicine | Admitting: Internal Medicine

## 2018-02-08 VITALS — BP 111/61 | HR 71 | Temp 98.8°F | Resp 17 | Ht 71.0 in | Wt 152.5 lb

## 2018-02-08 DIAGNOSIS — R253 Fasciculation: Secondary | ICD-10-CM | POA: Insufficient documentation

## 2018-02-08 DIAGNOSIS — W888XXA Exposure to other ionizing radiation, initial encounter: Secondary | ICD-10-CM | POA: Insufficient documentation

## 2018-02-08 DIAGNOSIS — Z923 Personal history of irradiation: Secondary | ICD-10-CM | POA: Insufficient documentation

## 2018-02-08 DIAGNOSIS — G540 Brachial plexus disorders: Secondary | ICD-10-CM | POA: Diagnosis present

## 2018-02-08 DIAGNOSIS — Z85818 Personal history of malignant neoplasm of other sites of lip, oral cavity, and pharynx: Secondary | ICD-10-CM | POA: Diagnosis not present

## 2018-02-08 DIAGNOSIS — C090 Malignant neoplasm of tonsillar fossa: Secondary | ICD-10-CM

## 2018-02-08 DIAGNOSIS — Z79899 Other long term (current) drug therapy: Secondary | ICD-10-CM | POA: Insufficient documentation

## 2018-02-08 MED ORDER — VITAMIN E 180 MG (400 UNIT) PO CAPS: ORAL_CAPSULE | ORAL | 5 refills | Status: AC

## 2018-02-08 MED ORDER — PENTOXIFYLLINE ER 400 MG PO TBCR: EXTENDED_RELEASE_TABLET | ORAL | 5 refills | Status: DC

## 2018-02-08 NOTE — Telephone Encounter (Signed)
No LOS 4/26 °

## 2018-02-08 NOTE — Progress Notes (Signed)
[facility] at [place], [place] [address] [phone]   Interval Evaluation  Date of Service: 02/08/18 Patient Name: [name] Patient MRN: [id number] Patient DOB: 10-17-1975 Provider: Ventura Sellers, MD  Identifying Statement:  [name] is a 42 y.o. male with No primary diagnosis found.   Referring Provider: Alroy Dust, L.Marlou Sa, [place] [address] [place], [place] [address]  Primary Cancer: Squamous cell tonsillar  Oncologic History:   Cancer of tonsillar fossa ([place])   03/03/2016 Imaging    CT neck: Left jugulodigastric adenopathy correlating with palpable complaint.  Tonsils symmetrically prominent for age.       03/27/2016 Initial Biopsy    Left neck mass (consult slides), fine needle aspirate: Malignant cells present, favor squamous cell carcinoma.       04/07/2016 PET scan    Solitary (L) level 2 LN enlarged and exhibits malignant range FDG uptake.  Symmetric uptake in both tonsillar regions. No definite findings identified to suggest primary site of disease. No evidence of metastatic disease to chest, abdomen, pelvis, or bony structures.       04/14/2016 Procedure    Biopsy of left tonsil: (+) invasive squamous cell carcinoma, p16+.   Biopsies of nasopharynx x 4 (benign), base of tongue x 2 (benign), and left oropharynx (benign).       04/21/2016 Initial Diagnosis    Cancer of tonsillar fossa ([place])      05/22/2016 - 07/05/2016 Radiation Therapy    Tonsils and bilateral neck / 70 Gy in 35 fractions to gross disease, 63 Gy in 35 fractions to high risk nodal echelons, and 56 Gy in 35 fractions to intermediate risk nodal echelons       Interval History:  [name] presents today for follow up.  He feels improvement in energy levels on the ritalin.  He feels his left arm dysfunction and sensory symptoms have worsened in the interval.  In the morning his arm feels like a "wet noodle" and he is unable to  lift it much at the shoulder joint.  This tends to improve during the day.  He does continue to experience significant anxiety related to his recent divorce and stressors at work.  The fasciculations have also increased in frequency, occurring daily now in all limbs, including his face.  He denies weakness in any limb other than the left arm at this time.  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    Medications: Current Outpatient Medications on File Prior to Visit  Medication Sig Dispense Refill  . acetaminophen (TYLENOL) 325 MG tablet Take 650 mg by mouth every 6 (six) hours as needed.    . citalopram (CELEXA) 20 MG tablet Take 1 tablet (20 mg total) by mouth daily. (Patient not taking: Reported on 07/12/2017) 30 tablet 5  . cyclobenzaprine (FLEXERIL) 5 MG tablet Take 1 tablet (5 mg total) by mouth 3 (three) times daily as needed for muscle spasms. 30 tablet 0  . dexamethasone (DECADRON) 4 MG tablet Take 1 tablet (4 mg total) by mouth daily. 10 tablet 0  . gabapentin (NEURONTIN) 300 MG capsule TAKE 1 CAPSULE (300 MG TOTAL) BY MOUTH 2 (TWO) TIMES DAILY. (Patient not taking: Reported on 10/02/2017) 60 capsule 6  . methylphenidate (RITALIN) 5 MG tablet Take 1 tablet (5 mg total) by mouth 2 (two) times daily. 60 tablet 0  . pentoxifylline (TRENTAL) 400 MG CR tablet Take 400mg  tab  daily x 1 week, then 400mg  BID; take with food (Patient not taking: Reported on 10/02/2017) 60 tablet 5  . ranitidine (ZANTAC) 150 MG capsule Take 150 mg by mouth as needed for heartburn.    . sodium fluoride (FLUORISHIELD) 1.1 % GEL dental gel Instill one drop of gel per tooth space of fluoride tray. Place over teeth for 5 minutes. Remove. Spit out excess. Repeat nightly. (Patient not taking: Reported on 12/06/2017) 120 mL prn  . topiramate (TOPAMAX) 25 MG tablet TAKE 2 TABLETS AT BEDTIME (Patient not taking: Reported on 10/02/2017) 180 tablet 3  . vitamin E 400 UNIT capsule  Take 400 IU daily x 1 week, then 400 IU BID (Patient not taking: Reported on 10/02/2017) 60 capsule 5   No current facility-administered medications on file prior to visit.     Allergies: No Known Allergies Past Medical History:  Past Medical History:  Diagnosis Date  . Back injury due to skiing accident 2004   jet ski accident  . Cancer [facility])    tosillar, s/p radiation therapy  . GERD (gastroesophageal reflux disease)   . History of radiation therapy 05/22/2016 - 07/05/2016   Tonsils and bilateral neck 70 Gy 35 fractions to gross disease, 63 Gy 35 fractions to high risk nodal echelons, 56 Gy 35 fractions to intermediate risk nodal echelons  . Mitral valve prolapse   . Weight loss    Past Surgical History:  Past Surgical History:  Procedure Laterality Date  . ELBOW SURGERY Right   . KNEE SURGERY Left   . NASOPHARYNGOSCOPY Left 04/14/2016   Procedure: BASE NASAL NASOPHARYNX;  Surgeon: Jerrell Belfast, MD;  Location: [facility];  Service: ENT;  Laterality: Left;  . NECK DISSECTION Left 07/04/2017   Dr. Conley Canal, [place] Left 04/14/2016   Procedure: TONSILLECTOMY AND BIOPSY OF TONGUE ;  Surgeon: Jerrell Belfast, MD;  Location: [facility];  Service: ENT;  Laterality: Left;   Social History:  Social History   Socioeconomic History  . Marital status: Married    Spouse name: Not on file  . Number of children: 0  . Years of education: 28  . Highest education level: Not on file  Occupational History    Comment: [employer]  . Financial resource strain: Not on file  . Food insecurity:    Worry: Not on file    Inability: Not on file  . Transportation needs:    Medical: Not on file    Non-medical: Not on file  Tobacco Use  . Smoking status: Never Smoker  . Smokeless tobacco: Former Network engineer and Sexual Activity  . 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 Drug use: No  . Sexual activity: Not on file  Lifestyle  . Physical activity:    Days per week: Not on file    Minutes per session: Not on file  . Stress: Not on file  Relationships  . Social connections:    Talks on phone: Not on file    Gets together: Not on file    Attends religious service: Not on file    Active member of club or organization: Not on file    Attends meetings of clubs or organizations: Not on file    Relationship status: Not on file  . Intimate partner violence:    Fear of  current or ex partner: Not on file    Emotionally abused: Not on file    Physically abused: Not on file    Forced sexual activity: Not on file  Other Topics Concern  . Not on file  Social History Narrative   Lives with wife   caffeine , rare   Family History:  Family History  Problem Relation Age of Onset  . Cancer Mother        Breast  . Cancer Father        Lung  . Cancer Brother        brain  . Cancer Maternal Grandfather        leukemia    Review of Systems: Constitutional: Denies fevers, chills or abnormal weight loss Eyes: Denies blurriness of vision Ears, nose, mouth, throat, and face: Denies mucositis or sore throat Respiratory: Denies cough, dyspnea or wheezes Cardiovascular: Denies palpitation, chest discomfort or lower extremity swelling Gastrointestinal:  Denies nausea, constipation, diarrhea GU: Denies dysuria or incontinence Skin: Denies abnormal skin rashes Neurological: Per HPI Musculoskeletal: +back pain Behavioral/Psych: ++stress   Physical Exam: Vitals:   02/08/18 0951  BP: 111/61  Pulse: 71  Resp: 17  Temp: 98.8 F (37.1 C)  SpO2: 100%   KPS: 80. General: Alert, cooperative, pleasant, in no acute distress Head: Craniotomy scar noted, dry and intact. EENT: No conjunctival injection or scleral icterus. Oral mucosa moist Lungs: Resp effort normal Cardiac: Regular rate and rhythm Abdomen:  Soft, non-distended abdomen Skin: No rashes cyanosis or petechiae. Extremities: No clubbing or edema  Neurologic Exam: Mental Status: Awake, alert, attentive to examiner. Oriented to self and environment. Language is fluent with intact comprehension.  Cranial Nerves: Visual acuity is grossly normal. Visual fields are full. Extra-ocular movements intact. No ptosis. Face is symmetric, tongue midline. Motor: Tone and bulk are normal. Left arm is 4+/5 proximally at shoulder, and distally there is subtle weakness affecting left hand. Unable to obtain brachiaradialis, biceps or triceps reflexes in the left arm.  Otherwise no pathologic reflexes present. Intact finger to nose bilaterally Sensory: Impaired to light touch along left arm and hand medially Gait: Normal and tandem gait is normal.   Labs: I have reviewed the data as listed    Component Value Date/Time   NA 140 05/25/2017 1030   NA 139 08/21/2016 1405   K 4.7 05/25/2017 1030   K 4.5 08/21/2016 1405   CL 104 05/25/2017 1030   CL 103 10/22/2012 0146   CO2 30 05/25/2017 1030   CO2 29 08/21/2016 1405   GLUCOSE 83 05/25/2017 1030   GLUCOSE 93 08/21/2016 1405   BUN 16 05/25/2017 1030   BUN 10.8 08/21/2016 1405   CREATININE 1.03 05/25/2017 1030   CREATININE 0.8 08/21/2016 1405   CALCIUM 9.8 05/25/2017 1030   CALCIUM 9.9 08/21/2016 1405   PROT 7.3 05/25/2017 1030   PROT 7.3 08/21/2016 1405   ALBUMIN 4.7 05/25/2017 1030   ALBUMIN 4.2 08/21/2016 1405   AST 14 (L) 05/25/2017 1030   AST 15 08/21/2016 1405   ALT 10 (L) 05/25/2017 1030   ALT 12 08/21/2016 1405   ALKPHOS 40 05/25/2017 1030   ALKPHOS 48 08/21/2016 1405   BILITOT 2.6 (H) 05/25/2017 1030   BILITOT 2.17 (H) 08/21/2016 1405   GFRNONAA >60 05/25/2017 1030   GFRNONAA >60 10/22/2012 0146   GFRAA >60 05/25/2017 1030   GFRAA >60 10/22/2012 0146   Lab Results  Component Value Date   WBC 3.2 (L) 06/07/2017  NEUTROABS 1.7 05/25/2017   HGB 13.7 06/07/2017   HCT 38.9 (L)  06/07/2017   MCV 85.9 06/07/2017   PLT 191 06/07/2017    Assessment/Plan 1. Radiation-induced brachial plexopathy  [name] describe some decline subjectively.  There may be some additional weakness proximally in the left arm than what was appreciated on prior examination.  We recommended restarting Trental and Vitamin E, which had been self-discontinued several months ago.      2. Fasiculations  He had NCS/EMG performed 1 year ago on the left arm only, which demonstrated denervation c/w plexopathy.  This has been attributed to neck radiation and surgery on the left neck.  It does not clearly explain the diffuse and progressive nature of his fasciculations described today.  It would be reasonable to repeat EMG/NCS with attention paid to other limbs in addition to the left arm.  Our office will schedule and coordinate this study which can again be performed with [name].    We appreciate the opportunity to participate in the care of [name].   All questions were answered. The patient knows to call the clinic with any problems, questions or concerns. No barriers to learning were detected.  The total time spent in the appointment was 40 minutes and more than 50% was on counseling and review of test results  Ventura Sellers, MD Medical Director of Neuro-Oncology [facility] at [place] 02/08/18 9:45 AM

## 2018-02-12 ENCOUNTER — Other Ambulatory Visit: Payer: Self-pay | Admitting: *Deleted

## 2018-02-12 DIAGNOSIS — G540 Brachial plexus disorders: Secondary | ICD-10-CM

## 2018-02-12 DIAGNOSIS — W888XXA Exposure to other ionizing radiation, initial encounter: Principal | ICD-10-CM

## 2018-02-18 ENCOUNTER — Other Ambulatory Visit: Payer: Self-pay | Admitting: Family Medicine

## 2018-02-18 DIAGNOSIS — N5089 Other specified disorders of the male genital organs: Secondary | ICD-10-CM

## 2018-02-19 ENCOUNTER — Other Ambulatory Visit (HOSPITAL_COMMUNITY): Payer: Self-pay | Admitting: Family Medicine

## 2018-02-19 ENCOUNTER — Other Ambulatory Visit: Payer: Self-pay | Admitting: Radiation Oncology

## 2018-02-19 ENCOUNTER — Telehealth: Payer: Self-pay | Admitting: *Deleted

## 2018-02-19 DIAGNOSIS — R011 Cardiac murmur, unspecified: Secondary | ICD-10-CM

## 2018-02-19 DIAGNOSIS — R1909 Other intra-abdominal and pelvic swelling, mass and lump: Secondary | ICD-10-CM

## 2018-02-19 DIAGNOSIS — C090 Malignant neoplasm of tonsillar fossa: Secondary | ICD-10-CM

## 2018-02-20 NOTE — Telephone Encounter (Signed)
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 Isidore Moos is ordering CT Neck to Groin to be conducted next available, his currently scheduled 6/14 Follow-up appt will be rescheduled for day after CT.  He voiced understanding.  [name], RN, BSN Head & Neck Oncology Nurse [facility] at [place] [phone]

## 2018-02-21 ENCOUNTER — Ambulatory Visit (HOSPITAL_COMMUNITY): Payer: Managed Care, Other (non HMO) | Attending: Internal Medicine

## 2018-02-21 ENCOUNTER — Other Ambulatory Visit: Payer: Self-pay

## 2018-02-21 ENCOUNTER — Ambulatory Visit
Admission: RE | Admit: 2018-02-21 | Discharge: 2018-02-21 | Disposition: A | Discharge status: Home / Self Care | Payer: Managed Care, Other (non HMO) | Source: Ambulatory Visit | Attending: Family Medicine | Admitting: Family Medicine

## 2018-02-21 DIAGNOSIS — Z923 Personal history of irradiation: Secondary | ICD-10-CM | POA: Insufficient documentation

## 2018-02-21 DIAGNOSIS — I081 Rheumatic disorders of both mitral and tricuspid valves: Secondary | ICD-10-CM | POA: Insufficient documentation

## 2018-02-21 DIAGNOSIS — R0600 Dyspnea, unspecified: Secondary | ICD-10-CM | POA: Insufficient documentation

## 2018-02-21 DIAGNOSIS — N5089 Other specified disorders of the male genital organs: Secondary | ICD-10-CM

## 2018-02-21 DIAGNOSIS — Z85818 Personal history of malignant neoplasm of other sites of lip, oral cavity, and pharynx: Secondary | ICD-10-CM | POA: Insufficient documentation

## 2018-02-21 DIAGNOSIS — R011 Cardiac murmur, unspecified: Secondary | ICD-10-CM | POA: Diagnosis present

## 2018-02-21 NOTE — Progress Notes (Signed)
[name] presents for follow up of radiation completed 07/05/2016 to his Tonsils and bilateral neck.   Pain issues, if any: He reports left shoulder pain that radiates to his neck. He has left arm pain. He also reports left groin pain that will come and go. He reports that he feels like his testicle contracts at times. The pain will radiate to his lower abdomen. He reports chest pain at times especially when at the gym. He had an EKG and Echo performed. Using a feeding tube?: No Weight changes, if any:  Wt Readings from Last 3 Encounters:  02/27/18 150 lb (68 kg)  02/08/18 152 lb 8 oz (69.2 kg)  12/06/17 155 lb 11.2 oz (70.6 kg)   Swallowing issues, if any: Food gets stuck in his throat at times.  Smoking or chewing tobacco? No Using fluoride trays daily? Yes Last ENT visit was on: N/A Other notable issues, if any:  He continues to report confusion and memory problems. He continues with cramps to his posterior neck when looking up or down. This pain is severe and prevents mobility at times.  He reports leg numbness at times when walking. He will lose his balance at times.  Dr. Mickeal Skinner 02/08/18 U/S scrotum 02/21/18 CT neck  02/25/18.  Other CT's not scheduled at this time.   BP 140/76   Pulse 73   Temp 98.3 F (36.8 C)   Resp 18   Ht 5\' 11"  (1.803 m)   Wt 150 lb (68 kg)   SpO2 100% Comment: room air  BMI 20.92 kg/m

## 2018-02-22 ENCOUNTER — Telehealth: Payer: Self-pay | Admitting: *Deleted

## 2018-02-22 ENCOUNTER — Telehealth (HOSPITAL_COMMUNITY): Payer: Self-pay | Admitting: Radiation Oncology

## 2018-02-22 NOTE — Telephone Encounter (Signed)
Called patient to inform of labs and test, lvm for a return call

## 2018-02-22 NOTE — Telephone Encounter (Signed)
Oncology Nurse Navigator Documentation  Rec'd call from Mr. Mcnay.  He stated he had just received call from Radiology Scheduling (ca 5:30 pm) indicating CTs scheduled for Monday have not been authorized and have been cancelled; peer-to-peer needed.  I spoke with Adia, Radiology Scheduling, reinstated appts pending Dr. Pearlie Oyster peer-to-peer Monday morning.  Called Mr. Yohn to inform.  Dr. Isidore Moos informed.  [name], RN, BSN Head & Neck Oncology Nurse [place] [facility] [place] [phone]

## 2018-02-22 NOTE — Telephone Encounter (Signed)
CALLED PATIENT TO INFORM OF STAT LABS ON 02-25-18 AND HIS CT ON 02-25-18, PT. TO PICK -UP CONTRAST TODAY IN RADIOLOGY AND HE WILL HAVE HIS SCANS @ 2:30 PM AND 3:00 PM @ [facility], PT. TO BE NPO- 4 HRS. PRIOR TO TEST, SPOKE WITH PATIENT AND HE IS AWARE OF THESE APPTS.

## 2018-02-25 ENCOUNTER — Inpatient Hospital Stay: Admit: 2018-02-25 | Discharge: 2018-02-25 | Disposition: A | Discharge status: Home / Self Care | Payer: Self-pay

## 2018-02-25 ENCOUNTER — Other Ambulatory Visit: Payer: Self-pay | Admitting: Gastroenterology

## 2018-02-25 ENCOUNTER — Ambulatory Visit (HOSPITAL_COMMUNITY): Admission: RE | Admit: 2018-02-25 | Payer: Managed Care, Other (non HMO) | Source: Ambulatory Visit

## 2018-02-25 ENCOUNTER — Ambulatory Visit (HOSPITAL_COMMUNITY): Payer: Managed Care, Other (non HMO)

## 2018-02-25 ENCOUNTER — Telehealth: Payer: Self-pay | Admitting: *Deleted

## 2018-02-25 ENCOUNTER — Ambulatory Visit
Admission: RE | Admit: 2018-02-25 | Discharge: 2018-02-25 | Disposition: A | Discharge status: Home / Self Care | Payer: Managed Care, Other (non HMO) | Source: Ambulatory Visit | Attending: Radiation Oncology | Admitting: Radiation Oncology

## 2018-02-25 ENCOUNTER — Encounter (HOSPITAL_COMMUNITY): Payer: Self-pay

## 2018-02-25 ENCOUNTER — Ambulatory Visit (HOSPITAL_COMMUNITY)
Admission: RE | Admit: 2018-02-25 | Discharge: 2018-02-25 | Disposition: A | Discharge status: Home / Self Care | Payer: Managed Care, Other (non HMO) | Source: Ambulatory Visit | Attending: Radiation Oncology | Admitting: Radiation Oncology

## 2018-02-25 DIAGNOSIS — C090 Malignant neoplasm of tonsillar fossa: Secondary | ICD-10-CM | POA: Insufficient documentation

## 2018-02-25 DIAGNOSIS — Z85818 Personal history of malignant neoplasm of other sites of lip, oral cavity, and pharynx: Secondary | ICD-10-CM | POA: Diagnosis not present

## 2018-02-25 LAB — BUN & CREATININE (CHCC)
BUN: 12 mg/dL (ref 7–26)
CREATININE: 1.05 mg/dL (ref 0.70–1.30)

## 2018-02-25 MED ORDER — IOHEXOL 300 MG/ML  SOLN
75.0000 mL | Freq: Once | INTRAMUSCULAR | Status: AC | PRN
  Administered 2018-02-25: 75 mL via INTRAVENOUS

## 2018-02-25 NOTE — Telephone Encounter (Signed)
Oncology Nurse Navigator Documentation  Called [name] in follow-up to my call earlier this morning to inform him physician review for authorization of CT abdomen/pelvis and CT Chest pending to be completed by end of day.  Indicated

## 2018-02-26 ENCOUNTER — Other Ambulatory Visit: Payer: Self-pay | Admitting: Radiation Oncology

## 2018-02-26 DIAGNOSIS — R1907 Generalized intra-abdominal and pelvic swelling, mass and lump: Secondary | ICD-10-CM

## 2018-02-27 ENCOUNTER — Other Ambulatory Visit: Payer: Self-pay

## 2018-02-27 ENCOUNTER — Ambulatory Visit
Admission: RE | Admit: 2018-02-27 | Discharge: 2018-02-27 | Disposition: A | Discharge status: Home / Self Care | Payer: Managed Care, Other (non HMO) | Source: Ambulatory Visit | Attending: Radiation Oncology | Admitting: Radiation Oncology

## 2018-02-27 ENCOUNTER — Encounter: Payer: Self-pay | Admitting: Radiation Oncology

## 2018-02-27 VITALS — BP 140/76 | HR 73 | Temp 98.3°F | Resp 18 | Ht 71.0 in | Wt 150.0 lb

## 2018-02-27 DIAGNOSIS — R200 Anesthesia of skin: Secondary | ICD-10-CM | POA: Insufficient documentation

## 2018-02-27 DIAGNOSIS — M25512 Pain in left shoulder: Secondary | ICD-10-CM | POA: Diagnosis not present

## 2018-02-27 DIAGNOSIS — R079 Chest pain, unspecified: Secondary | ICD-10-CM | POA: Diagnosis not present

## 2018-02-27 DIAGNOSIS — R011 Cardiac murmur, unspecified: Secondary | ICD-10-CM | POA: Diagnosis not present

## 2018-02-27 DIAGNOSIS — C090 Malignant neoplasm of tonsillar fossa: Secondary | ICD-10-CM

## 2018-02-27 DIAGNOSIS — Z79899 Other long term (current) drug therapy: Secondary | ICD-10-CM | POA: Diagnosis not present

## 2018-02-27 DIAGNOSIS — Z85819 Personal history of malignant neoplasm of unspecified site of lip, oral cavity, and pharynx: Secondary | ICD-10-CM | POA: Diagnosis present

## 2018-02-27 NOTE — Progress Notes (Signed)
Radiation Oncology         [phone]) [phone] ________________________________  Name: [name] MRN: [id number]  Date: 02/27/2018  DOB: 08-14-1976  Follow-Up Visit  Note  CC: Alroy Dust, L.Marlou Sa, MD  Jerrell Belfast, MD  Diagnosis and Prior Radiotherapy:     ICD-10-CM   1. 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  Narrative:  The patient returns today for routine follow-up of definitive radiation completed to his tonsils and bilateral neck on 07/05/16. Recent CT scan of the neck on 02/25/18 showed no evidence of recurrence. There was left trapezius and sternocleidomastoid atrophy after interval left neck dissection noted, given his history of worsening left upper extremity dysfunction. I personally reviewed his CT imaging today.   He has had a left scrotal mass for several weeks with bilateral inguinal pain. He was evaluated with an ultrasound of the scrotum on 02/21/18 that showed a left epididymal cyst, otherwise normal-appearing testicles.  Pain issues, if any: He reports worsening left arm and shoulder pain that radiates to his neck. He reports decreased strength of his left arm. He reports cramps to his posterior neck when looking up or down. This pain is severe and prevents mobility at times. He also reports daily left groin pain that is not worsened by activity. He reports that he feels like his testicle contracts at times. The pain radiates to his lower abdomen. He denies any bowel changes. He reports swelling to his inner thighs. He  denies doing any heavy lower body lifting. He reports chest pain at times, especially when at the gym. He sometimes wakes up from night sweats not being able to breathe. He recently had an EKG and Echo performed. Heart murmur heard by pcp  Using a feeding tube?: No  Weight changes, if any:  Wt Readings from Last 3 Encounters:  02/27/18 150 lb (68 kg)  02/08/18 152 lb 8 oz (69.2 kg)  12/06/17 155 lb 11.2 oz (70.6 kg)   Swallowing issues, if any: Food gets stuck in his throat at times. He denies any pain with swallowing.  Smoking or chewing tobacco? No  Using fluoride trays daily? Yes  Last ENT visit was on: 07/09/17 with Dr. Conley Canal  Other notable issues, if any: He continues to report confusion and memory problems. He reports leg numbness at times when walking. He will lose his balance at times. He reports muscle twitching throughout his whole body b/l that is new and progressing. He has twitching in the face. He is going to be seen tomorrow in neurology for more nerve conduction studies. He has resumed Trental and vitamin E. He is also taking methylphenidate through Dr Mickeal Skinner. Pelvic shooting pains that come on spontaneously, severe   ALLERGIES:  has No Known Allergies.  Meds: Current Outpatient Medications  Medication Sig Dispense Refill  . acetaminophen (TYLENOL) 325 MG tablet Take 650 mg by mouth every 6 (six) hours as needed.    . methylphenidate (RITALIN) 5 MG tablet Take 1 tablet (5 mg total) by mouth 2 (two) times daily. 60 tablet 0  . pentoxifylline (TRENTAL) 400  MG CR tablet Take 400mg  tab daily x 1 week, then 400mg  BID; take with food 60 tablet 5  . ranitidine (ZANTAC) 150 MG capsule Take 150 mg by mouth as needed for heartburn.    . sodium fluoride (FLUORISHIELD) 1.1 % GEL dental gel Instill one drop of gel per tooth space of fluoride tray. Place over teeth for 5 minutes. Remove. Spit out excess. Repeat nightly. 120 mL prn  . vitamin E 400 UNIT capsule Take 400 IU daily x 1  week, then 400 IU BID 60 capsule 5   No current facility-administered medications for this encounter.     Physical Findings: Wt Readings from Last 3 Encounters:  02/27/18 150 lb (68 kg)  02/08/18 152 lb 8 oz (69.2 kg)  12/06/17 155 lb 11.2 oz (70.6 kg)    height is 5\' 11"  (1.803 m) and weight is 150 lb (68 kg). His temperature is 98.3 F (36.8 C). His blood pressure is 140/76 and his pulse is 73. His respiration is 18 and oxygen saturation is 100%.  General: Alert and oriented, in no acute distress. HEENT: Extraocular movements are intact. His tongue deviates very slightly to the left.  Neck: Neck is supple, no palpable cervical or supraclavicular lymphadenopathy. Heart: Regular in rate and rhythm. He has a murmur that is heard throughout the precordium that is loudest at the mitral region. Patient aware of this via PCP - see above Chest: Clear to auscultation bilaterally, with no rhonchi, wheezes, or rales. Abdomen: Soft, nontender, nondistended, with no rigidity or guarding. Extremities: No cyanosis or edema. Lymphatics: see Neck Exam Skin: No concerning lesions. Musculoskeletal: He is able to abduct his left shoulder to about 75 degrees, no more - and feels numbness in arm when doing this. Neurologic: When he speaks, the left side of the face moves more than the right, particularly at the eyebrow. His left eyebrow raises more than the right when he intentionally raises his eyebrows.  Psychiatric: Judgment and insight are intact. Affect is appropriate.    Lab Findings: Lab Results  Component Value Date   WBC 3.2 (L) 06/07/2017   HGB 13.7 06/07/2017   HCT 38.9 (L) 06/07/2017   MCV 85.9 06/07/2017   PLT 191 06/07/2017    Lab Results  Component Value Date   TSH 2.631 12/21/2017    Radiographic Findings: Personally reviewed by me. Ct Soft Tissue Neck W Contrast  Result Date: 02/25/2018 CLINICAL DATA:  Surveillance for high risk tonsil cancer diagnosis in 2017 with  recurrence in 2018. Left shoulder pain and sticking food since surgery. EXAM: CT NECK WITH CONTRAST TECHNIQUE: Multidetector CT imaging of the neck was performed using the standard protocol following the bolus administration of intravenous contrast. CONTRAST:  7mL OMNIPAQUE IOHEXOL 300 MG/ML  SOLN COMPARISON:  PET-CT 10/10/2016 FINDINGS: Pharynx and larynx: No masslike asymmetry or enhancement. Submucosal low-density expansion typical of radiation with blunting of the epiglottis that is stable. Stable rotation of the thyroid cartilage relative to the hyoid. Salivary glands: Submandibular atrophy. Thyroid: Normal. Lymph nodes: Status post left neck dissection. No enlarged or abnormal density nodes. Vascular: Negative Limited intracranial: Negative Visualized orbits: Negative Mastoids and visualized paranasal sinuses: Atelectatic bilateral maxillary sinus. No opacification. Skeleton: Left trapezius and sternocleidomastoid atrophy that has developed since 05/29/2017 neck MRI. Upper chest: Clear IMPRESSION: 1. No evidence of recurrence. 2. Left trapezius and sternocleidomastoid atrophy after interval left neck dissection, noted given history of worsening left upper extremity dysfunction. Electronically Signed   By: Monte Fantasia  M.D.   On: 02/25/2018 16:04   US Scrotum W/doppler  Result Date: 02/22/2018 CLINICAL DATA:  Left scrotal mass for several weeks with bilateral inguinal pain EXAM: SCROTAL ULTRASOUND DOPPLER ULTRASOUND OF THE TESTICLES TECHNIQUE: Complete ultrasound examination of the testicles, epididymis, and other scrotal structures was performed. Color and spectral Doppler ultrasound were also utilized to evaluate blood flow to the testicles. COMPARISON:  None. FINDINGS: Right testicle Measurements: 4.1 x 1.9 x 2.1 cm. No mass or microlithiasis visualized. Left testicle Measurements: 4.0 x 1.8 x 2.3 cm. No mass or microlithiasis visualized. Right epididymis:  Normal in size and appearance. Left  epididymis: A left epididymal cyst is noted which measures approximately 6 mm in greatest dimension. This corresponds to the patient's palpable abnormality. Hydrocele:  None visualized. Varicocele:  None visualized. Pulsed Doppler interrogation of both testes demonstrates normal low resistance arterial and venous waveforms bilaterally. Normal-appearing lymph nodes are noted in the right inguinal region corresponding to the patient's palpable abnormality. IMPRESSION: Left epididymal cyst which corresponds to the patient's palpable abnormality. Otherwise normal-appearing testicles. Electronically Signed   By: Inez Catalina M.D.   On: 02/22/2018 10:27    Impression/Plan:    1. Head and Neck Cancer Status: No evidence of disease on CT imaging or clinical exam. Obtain CT chest and pelvis - chest to rule out metastases, pelvis for symptoms above. I will adjust the CT order stating that the patient will take oral contrast. I will call him with these results.  2. Thyroid function: WNL Lab Results  Component Value Date   TSH 2.631 12/21/2017   3. Heart murmur on exam today: He will see PCP who has ordered studies.  4. ENT: Send note to Dr. Conley Canal for follow up - none scheduled; I think ENT may still want to be involved  5. Pain/Neurologic issues: He is going to be seen tomorrow in neurology for more nerve conduction studies. I talked with Dr. Mickeal Skinner today who will continue to follow him and review results.  I am puzzled by the progressiveness and diffuse nature of his symptoms and signs.  I cannot help but wonder if he had an underlying neuro-muscular condition that happens to predispose him to radiation-induced injury.  There could be more than one process going on (ie a rare radiation induced plexopathy +/- spinal cord myelopathy, +/- facial nerve dysfunction,  and a degenerative neuromuscular / demyelinating condition that set the stage for such profound inability for the tissue to repair from  radiotherapy. Review of his radiation plans reveals quite standard dose distrubutions and good daily set-up on the machine.  His experience post treatment is entirely atypical for a patient receiving such a course of treatment. Dr Mickeal Skinner may consider obtaining another opinion, ie at [facility], depending on study results).   6. Follow up in 3 months with me.  I reiterated to him that I will continue to support him and advocate for him.  He is going through such a tough and scary time. I appreciate every provider's help.  I spent 40 minutes face to face with the patient and more than 50% of that time was spent in counseling and/or coordination of care. _____________________________________   Eppie Gibson, MD  This document serves as a record of services personally performed by Eppie Gibson, MD. It was created on her behalf by Rae Lips, a trained medical scribe. The creation of this record is based on the scribe's personal observations and the provider's statements to them. This document has been checked  and approved by the attending provider.

## 2018-02-28 ENCOUNTER — Encounter: Payer: Self-pay | Admitting: Radiation Oncology

## 2018-02-28 ENCOUNTER — Encounter (INDEPENDENT_AMBULATORY_CARE_PROVIDER_SITE_OTHER): Payer: Managed Care, Other (non HMO) | Admitting: Diagnostic Neuroimaging

## 2018-02-28 ENCOUNTER — Ambulatory Visit (INDEPENDENT_AMBULATORY_CARE_PROVIDER_SITE_OTHER): Payer: Managed Care, Other (non HMO) | Admitting: Diagnostic Neuroimaging

## 2018-02-28 DIAGNOSIS — G540 Brachial plexus disorders: Secondary | ICD-10-CM | POA: Diagnosis not present

## 2018-02-28 DIAGNOSIS — Z0289 Encounter for other administrative examinations: Secondary | ICD-10-CM

## 2018-02-28 DIAGNOSIS — W888XXA Exposure to other ionizing radiation, initial encounter: Principal | ICD-10-CM

## 2018-02-28 DIAGNOSIS — R253 Fasciculation: Secondary | ICD-10-CM

## 2018-03-01 NOTE — Procedures (Signed)
[facility]  NCS (NERVE CONDUCTION STUDY) WITH EMG (ELECTROMYOGRAPHY) REPORT   STUDY DATE: 02/28/18 PATIENT NAME: [name] DOB: April 28, 1976 MRN: [id number]  ORDERING CLINICIAN: Merrilyn Puma, MD  TECHNOLOGIST: Oneita Jolly ELECTROMYOGRAPHER: Earlean Polka. 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  Now with new onset bilateral upper and lower extremity weakness and muscle twitching for last few months. Exam notable for proximal muscle weakness, rare fasciculations in right deltoid and right gastrocnemius, and normal sensation.  Muscle stretch reflexes are decreased in upper and lower extremities.   FINDINGS: NERVE CONDUCTION STUDY: Bilateral median, bilateral ulnar, bilateral peroneal and bilateral tibial motor responses are normal.  Bilateral radial, bilateral median, bilateral ulnar, bilateral sural and bilateral superficial peroneal sensory responses are normal.  Bilateral medial antebrachial cutaneous nerves of the forearm are normal.  Right lateral antebrachial cutaneous nerve forearm is normal.  Left antebrachial cutaneous nerve of forearm has normal peak latency and decreased amplitude.  Left spinal accessory motor response has slightly prolonged distal latency and decreased amplitude.   NEEDLE ELECTROMYOGRAPHY:  Needle examination of bilateral upper and right lower extremities, cervical, thoracic and lumbar paraspinal muscles was performed.  See table for further details.  No abnormal spontaneous activity noted in all muscle groups tested except for 1+ positive sharp waves in right biceps and rare fasciculations in right gastrocnemius muscles.  Motor unit recruitment was normal in all muscle groups tested except for early recruitment with small and polyphasic motor units in the  following muscles: Left deltoid, biceps, left triceps, right deltoid, right biceps, right triceps, right vastus medialis, right gastrocnemius muscles.  Also left trapezius muscle notable for decreased recruitment of large motor units on exertion.  Bilateral cervical, right thoracic, right lumbar paraspinal muscles are normal.   IMPRESSION:   Abnormal study demonstrating: 1. 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  Previously on electrodiagnostic study there was no motor response recorded, but now motor response is able to be recorded.  Previously spontaneous activity was noted on needle EMG, and now chronic denervation pattern is noted.  This may represent healing / recovery process.  2. Left antebrachial cutaneous nerve of forearm is slightly abnormal.  Remaining nerve conduction studies of left upper extremity are normal.  No other current electrodiagnostic evidence of left brachial plexopathy at this time, although left brachial plexopathy was diagnosed on MRI study in the past.  3. Bilateral upper and right lower extremity needle EMG study notable for myopathic recruitment pattern in proximal muscles.  Fasciculations were noted in right gastrocnemius muscle. These findings correlate with patient's new onset of generalized muscle weakness and muscle twitching.  Considerations would include steroid myopathy, inflammatory myopathy, muscle trauma, toxic or endocrine etiologies.  4. No electrodiagnostic evidence of underlying widespread polyneuropathy or polyradiculopathy.     INTERPRETING PHYSICIAN:  Penni Bombard, MD Certified in Neurology, Neurophysiology and Neuroimaging  [facility] [address], [place], [place] [address] [phone]  [facility]    Nerve / Sites Muscle Latency Ref. Amplitude Ref. Rel Amp Segments Distance Velocity Ref. Area    ms ms mV mV %  cm m/s m/s mVms  R Median - APB     Wrist  APB 3.9 ?4.4 12.4 ?4.0 100 Wrist - APB 7   55.7     Upper arm APB 8.0  12.2  97.9  Upper arm - Wrist 22 53 ?49 54.0  L Median - APB     Wrist APB 3.8 ?4.4 4.4 ?4.0 100 Wrist - APB 7   22.4     Upper arm APB 8.0  4.8  109 Upper arm - Wrist 22 52 ?49 24.5  R Ulnar - ADM     Wrist ADM 3.3 ?3.3 12.5 ?6.0 100 Wrist - ADM 7   53.2     B.Elbow ADM 7.5  10.7  85.5 B.Elbow - Wrist 21 50 ?49 45.4     A.Elbow ADM 9.5  10.5  97.5 A.Elbow - B.Elbow 10 51 ?49 44.5         A.Elbow - Wrist      L Ulnar - ADM     Wrist ADM 3.3 ?3.3 11.0 ?6.0 100 Wrist - ADM 7   37.2     B.Elbow ADM 7.1  10.1  91.6 B.Elbow - Wrist 21 55 ?49 34.8     A.Elbow ADM 8.9  9.4  92.7 A.Elbow - B.Elbow 10 56 ?49 32.3         A.Elbow - Wrist      R Peroneal - EDB     Ankle EDB 6.1 ?6.5 6.3 ?2.0 100 Ankle - EDB 9   23.5     Fib head EDB 13.2  7.1  112 Fib head - Ankle 32 46 ?44 28.3     Pop fossa EDB 15.9  6.7  94.4 Pop fossa - Fib head 12 44 ?44 28.5         Pop fossa - Ankle      L Peroneal - EDB     Ankle EDB 6.4 ?6.5 5.7 ?2.0 100 Ankle - EDB 9   23.4     Fib head EDB 14.0  5.8  101 Fib head - Ankle 33 44 ?44 24.5     Pop fossa EDB 16.7  6.3  109 Pop fossa - Fib head 12 44 ?44 26.8         Pop fossa - Ankle      R Tibial - AH     Ankle AH 5.7 ?5.8 16.0 ?4.0 100 Ankle - AH 9   51.4     Pop fossa AH 15.0  10.4  64.9 Pop fossa - Ankle 38 41 ?41 47.2  L Tibial - AH     Ankle AH 5.6 ?5.8 20.9 ?4.0 100 Ankle - AH 9   54.3     Pop fossa AH 14.8  13.3  63.7 Pop fossa - Ankle 38 41 ?41 44.1  L Accessory (spinal) - Trapezius     Neck Trapezius 3.9  4.1  100     32.2                        SNC    Nerve / Sites Rec. Site Peak Lat Ref.  Amp Ref. Segments Distance    ms ms V V  cm  R Radial - Anatomical snuff box (Forearm)     Forearm Wrist 2.4 ?2.9 22 ?15 Forearm - Wrist 10  L Radial - Anatomical snuff box (Forearm)     Forearm Wrist 2.5 ?2.9 26 ?15 Forearm - Wrist 10  R Sural - Ankle (Calf)     Calf Ankle 4.0 ?4.4 14 ?6 Calf  - Ankle 14  L Sural - Ankle (Calf)     Calf Ankle 3.8 ?4.4 18 ?6 Calf - Ankle  14  R Superficial peroneal - Ankle     Lat leg Ankle 4.0 ?4.4 7 ?6 Lat leg - Ankle 14  L Superficial peroneal - Ankle     Lat leg Ankle 4.1 ?4.4 12 ?6 Lat leg - Ankle 14  R Median - Orthodromic (Dig II, Mid palm)     Dig II Wrist 3.2 ?3.4 20 ?10 Dig II - Wrist 13  L Median - Orthodromic (Dig II, Mid palm)     Dig II Wrist 3.1 ?3.4 19 ?10 Dig II - Wrist 13  R Ulnar - Orthodromic, (Dig V, Mid palm)     Dig V Wrist 3.1 ?3.1 18 ?5 Dig V - Wrist 11  L Ulnar - Orthodromic, (Dig V, Mid palm)     Dig V Wrist 2.9 ?3.1 15 ?5 Dig V - Wrist 11  R Lateral antebrachial cutaneous - Forearm (Elbow)     Elbow Forearm 2.9 ?3.0 10 ?10 Elbow - Forearm 12  L Lateral antebrachial cutaneous - Forearm (Elbow)     Elbow Forearm 2.8 ?3.0 4 ?10 Elbow - Forearm 12  R Medial antebrachial cutaneous - Forearm (Elbow)     Elbow Forearm 2.5 ?3.2 5 ?5 Elbow - Forearm 12  L Medial antebrachial cutaneous - Forearm (Elbow)     Elbow Forearm 2.4 ?3.2 9 ?5 Elbow - Forearm 12                                 F  Wave    Nerve F Lat Ref.   ms ms  R Tibial - AH 52.8 ?56.0  L Tibial - AH 52.8 ?56.0  R Ulnar - ADM 31.1 ?32.0  L Ulnar - ADM 30.1 ?32.0             EMG full       EMG Summary Table    Spontaneous MUAP Recruitment  Muscle IA Fib PSW Fasc Other Amp Dur. Poly Pattern  L. Biceps brachii Normal None None None _______ Decreased Normal 1+ Early  L. Triceps brachii Normal None None None _______ Decreased Normal 1+ Early  L. Flexor carpi radialis Normal None None None _______ Normal Normal Normal Normal  L. First dorsal interosseous Normal None None None _______ Normal Normal Normal Normal  L. Deltoid Normal None None None _______ Normal Normal 1+ Early  L. Trapezius Normal None None None _______ Increased Normal Normal Reduced  R. Vastus medialis Normal None None None _______ Normal Normal 1+ Early  R. Tibialis anterior Normal None  None None _______ Normal Normal Normal Normal  R. Gastrocnemius (Medial head) Normal None None Rare _______ Decreased Normal 1+ Early  R. Deltoid Normal None None None _______ Decreased Normal 1+ Early  R. Biceps brachii Normal None 1+ None _______ Decreased Normal 1+ Early  R. Triceps brachii Normal None None None _______ Decreased Normal 1+ Early  R. Flexor carpi radialis Normal None None None _______ Normal Normal Normal Normal  R. First dorsal interosseous Normal None None None _______ Normal Normal Normal Normal  L. Cervical paraspinals (mid) Normal None None None _______ Normal Normal Normal Normal  R. Cervical paraspinals (mid) Normal None None None _______ Normal Normal Normal Normal  R. Thoracic paraspinals (mid) Normal None None None _______ Normal Normal Normal Normal  R. Lumbar paraspinals (low) Normal None None None _______ Normal Normal Normal Normal

## 2018-03-05 ENCOUNTER — Telehealth: Payer: Self-pay | Admitting: *Deleted

## 2018-03-05 ENCOUNTER — Ambulatory Visit: Payer: Self-pay | Admitting: Radiation Oncology

## 2018-03-05 NOTE — Telephone Encounter (Signed)
Called patient to inform of CT for 03-07-18- arrival time - 12:45 pm @ [facility], pt. to be NPO- 4 hrs. Prior to test, patient to pick-up prep on 03-06-18, spoke with patient and he is aware of this test

## 2018-03-07 ENCOUNTER — Ambulatory Visit (HOSPITAL_COMMUNITY)
Admission: RE | Admit: 2018-03-07 | Discharge: 2018-03-07 | Disposition: A | Discharge status: Home / Self Care | Payer: Managed Care, Other (non HMO) | Source: Ambulatory Visit | Attending: Radiation Oncology | Admitting: Radiation Oncology

## 2018-03-07 ENCOUNTER — Other Ambulatory Visit: Payer: Self-pay

## 2018-03-07 ENCOUNTER — Encounter: Payer: Self-pay | Admitting: Internal Medicine

## 2018-03-07 ENCOUNTER — Inpatient Hospital Stay (HOSPITAL_BASED_OUTPATIENT_CLINIC_OR_DEPARTMENT_OTHER): Payer: Managed Care, Other (non HMO) | Admitting: Internal Medicine

## 2018-03-07 VITALS — BP 117/81 | HR 64 | Temp 97.9°F | Resp 17 | Ht 71.0 in | Wt 151.7 lb

## 2018-03-07 DIAGNOSIS — Z923 Personal history of irradiation: Secondary | ICD-10-CM

## 2018-03-07 DIAGNOSIS — C090 Malignant neoplasm of tonsillar fossa: Secondary | ICD-10-CM | POA: Insufficient documentation

## 2018-03-07 DIAGNOSIS — G540 Brachial plexus disorders: Secondary | ICD-10-CM

## 2018-03-07 DIAGNOSIS — R253 Fasciculation: Secondary | ICD-10-CM | POA: Insufficient documentation

## 2018-03-07 DIAGNOSIS — K8020 Calculus of gallbladder without cholecystitis without obstruction: Secondary | ICD-10-CM | POA: Diagnosis not present

## 2018-03-07 DIAGNOSIS — R1907 Generalized intra-abdominal and pelvic swelling, mass and lump: Secondary | ICD-10-CM | POA: Diagnosis present

## 2018-03-07 DIAGNOSIS — G729 Myopathy, unspecified: Secondary | ICD-10-CM | POA: Insufficient documentation

## 2018-03-07 DIAGNOSIS — Z79899 Other long term (current) drug therapy: Secondary | ICD-10-CM

## 2018-03-07 MED ORDER — IOPAMIDOL (ISOVUE-300) INJECTION 61%
100.0000 mL | Freq: Once | INTRAVENOUS | Status: AC | PRN
  Administered 2018-03-07: 100 mL via INTRAVENOUS

## 2018-03-07 MED ORDER — IOPAMIDOL (ISOVUE-300) INJECTION 61%
INTRAVENOUS | Status: AC
  Filled 2018-03-07: qty 100

## 2018-03-07 NOTE — Progress Notes (Signed)
[facility] [place], [place] [address] [phone]   Interval Evaluation  Date of Service: 03/07/18 Patient Name: [name] Patient MRN: [id number] Patient DOB: [birth year] Provider: Ventura Sellers, MD  Identifying Statement:  [name] is a 42 y.o. 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  Tonsils symmetrically prominent for age.       03/27/2016 Initial Biopsy    Left neck mass (consult slides), fine needle aspirate: Malignant cells present, favor squamous cell carcinoma.       04/07/2016 PET scan    Solitary (L) level 2 LN enlarged and exhibits malignant range FDG uptake.  Symmetric uptake in both tonsillar regions. No definite findings identified to suggest primary site of disease. No evidence of metastatic disease to chest, abdomen, pelvis, or bony structures.       04/14/2016 Procedure    Biopsy of left tonsil: (+) invasive squamous cell carcinoma, p16+.   Biopsies of nasopharynx x 4 (benign), base of tongue x 2 (benign), and left oropharynx (benign).       04/21/2016 Initial Diagnosis    Cancer of tonsillar fossa ([place])      05/22/2016 - 07/05/2016 Radiation Therapy    Tonsils and bilateral neck / 70 Gy in 35 fractions to gross disease, 63 Gy in 35 fractions to high risk nodal echelons, and 56 Gy in 35 fractions to intermediate risk nodal echelons       Interval History:  [name] presents today for follow up after recent EMG/NCS.  He continues to feel the same dysfunction with the left arm, in particular at the shoulder joint.  Although he denies frank weakness in the other limbs, on more focused  questioning, he does acknowledge strength decline with his lifting regimen over the past ~2-3 months.   This decline could be as great as 50% reduction in tolerated weight.  He also describes continued progression of diffuse fasciculations involving his arms, legs, back and abdomen.  These events occur daily and have overall increased in frequency.  In addition, episodes of lower legs "pins and needles" are described as intermittent.  At times he feels unsteady because he can't feel his feet underneath him adequately.   Medications: Current Outpatient Medications on File Prior to Visit  Medication Sig Dispense Refill  . acetaminophen (TYLENOL) 325 MG tablet Take 650 mg by mouth every 6 (six) hours as needed.    . meloxicam (MOBIC) 15 MG tablet Take 1 tablet by mouth daily.    . methylphenidate (RITALIN) 5 MG tablet Take 1 tablet (5 mg total) by mouth 2 (two) times daily. 60 tablet 0  . pentoxifylline (TRENTAL) 400 MG CR tablet Take 457m tab daily x 1 week, then 4056mBID; take with food 60 tablet 5  . sodium fluoride (FLUORISHIELD) 1.1 % GEL dental gel Instill one drop of gel per tooth space of fluoride tray. Place over teeth for 5 minutes. Remove. Spit out excess. Repeat nightly. 120 mL prn  . vitamin E 400 UNIT capsule Take 400 IU daily x 1 week, then 400 IU BID 60 capsule 5  . cyclobenzaprine (FLEXERIL) 10 MG tablet Take 1 tablet by mouth daily.    .Marland Kitchen  gabapentin (NEURONTIN) 100 MG capsule Take 1 capsule by mouth daily.    . ranitidine (ZANTAC) 150 MG capsule Take 150 mg by mouth as needed for heartburn.     No current facility-administered medications on file prior to visit.     Allergies: No Known Allergies Past Medical History:  Past Medical History:  Diagnosis Date  . Back injury due to skiing accident 2004   jet ski accident  . Cancer [facility])    tosillar, s/p radiation therapy  . GERD (gastroesophageal reflux disease)   . History of radiation therapy 05/22/2016 - 07/05/2016   Tonsils  and bilateral neck 70 Gy 35 fractions to gross disease, 63 Gy 35 fractions to high risk nodal echelons, 56 Gy 35 fractions to intermediate risk nodal echelons  . Mitral valve prolapse   . Weight loss    Past Surgical History:  Past Surgical History:  Procedure Laterality Date  . ELBOW SURGERY Right   . KNEE SURGERY Left   . NASOPHARYNGOSCOPY Left 04/14/2016   Procedure: BASE NASAL NASOPHARYNX;  Surgeon: Jerrell Belfast, MD;  Location: [facility];  Service: ENT;  Laterality: Left;  . NECK DISSECTION Left 07/04/2017   Dr. Conley Canal, [place] Left 04/14/2016   Procedure: TONSILLECTOMY AND BIOPSY OF TONGUE ;  Surgeon: Jerrell Belfast, MD;  Location: [facility];  Service: ENT;  Laterality: Left;   Social History:  Social History   Socioeconomic History  . Marital status: Married    Spouse name: Not on file  . Number of children: 0  . Years of education: 44  . Highest education level: Not on file  Occupational History    Comment: [employer]  . Financial resource strain: Not on file  . Food insecurity:    Worry: Not on file    Inability: Not on file  . Transportation needs:    Medical: Not on file    Non-medical: Not on file  Tobacco Use  . Smoking status: Never Smoker  . Smokeless tobacco: Former Network engineer and Sexual Activity  . Alcohol use: Yes    Alcohol/week: 0.0 oz    Comment: social: Research scientist (life sciences) at [employer]  . Drug use: No  . Sexual activity: Not on file  Lifestyle  . Physical activity:    Days per week: Not on file    Minutes per session: Not on file  . Stress: Not on file  Relationships  . Social connections:    Talks on phone: Not on file    Gets together: Not on file    Attends religious service: Not on file    Active member of club or organization: Not on file    Attends meetings of clubs or organizations: Not on file     Relationship status: Not on file  . Intimate partner violence:    Fear of current or ex partner: Not on file    Emotionally abused: Not on file    Physically abused: Not on file    Forced sexual activity: Not on file  Other Topics Concern  . Not on file  Social History Narrative   Lives with wife   caffeine , rare   Family History:  Family History  Problem Relation Age of Onset  . Cancer Mother        Breast  . Cancer Father        Lung  . Cancer Brother  brain  . Cancer Maternal Grandfather        leukemia    Review of Systems: Constitutional: Denies fevers, chills or abnormal weight loss Eyes: Denies blurriness of vision Ears, nose, mouth, throat, and face: Denies mucositis or sore throat Respiratory: Denies cough, dyspnea or wheezes Cardiovascular: Denies palpitation, chest discomfort or lower extremity swelling Gastrointestinal:  Denies nausea, constipation, diarrhea GU: Denies dysuria or incontinence Skin: Denies abnormal skin rashes Neurological: Per HPI Musculoskeletal: +back pain with recent injury Behavioral/Psych: ++stress   Physical Exam: Vitals:   03/07/18 1111  BP: 117/81  Pulse: 64  Resp: 17  Temp: 97.9 F (36.6 C)  SpO2: 100%   KPS: 80. General: Alert, cooperative, pleasant, in no acute distress Head: Craniotomy scar noted, dry and intact. EENT: No conjunctival injection or scleral icterus. Oral mucosa moist Lungs: Resp effort normal Cardiac: Regular rate and rhythm Abdomen: Soft, non-distended abdomen Skin: No rashes cyanosis or petechiae. Extremities: No clubbing or edema  Neurologic Exam: Mental Status: Awake, alert, attentive to examiner. Oriented to self and environment. Language is fluent with intact comprehension.  Cranial Nerves: Visual acuity is grossly normal. Visual fields are full. Extra-ocular movements intact. No ptosis. Face is symmetric, tongue midline. Motor: Tone and bulk are normal. Left arm is 4+/5 proximally at  shoulder, and distally there is subtle weakness affecting left hand. Unable to obtain brachiaradialis, biceps or triceps reflexes in the left arm.  Otherwise no pathologic reflexes present. Intact finger to nose bilaterally Sensory: Impaired to light touch along left arm and hand medially, as well as bilateral lower legs to upper shins. Gait: Normal and tandem gait is normal.   Labs: I have reviewed the data as listed    Component Value Date/Time   NA 140 05/25/2017 1030   NA 139 08/21/2016 1405   K 4.7 05/25/2017 1030   K 4.5 08/21/2016 1405   CL 104 05/25/2017 1030   CL 103 10/22/2012 0146   CO2 30 05/25/2017 1030   CO2 29 08/21/2016 1405   GLUCOSE 83 05/25/2017 1030   GLUCOSE 93 08/21/2016 1405   BUN 12 02/25/2018 1317   BUN 10.8 08/21/2016 1405   CREATININE 1.05 02/25/2018 1317   CREATININE 0.8 08/21/2016 1405   CALCIUM 9.8 05/25/2017 1030   CALCIUM 9.9 08/21/2016 1405   PROT 7.3 05/25/2017 1030   PROT 7.3 08/21/2016 1405   ALBUMIN 4.7 05/25/2017 1030   ALBUMIN 4.2 08/21/2016 1405   AST 14 (L) 05/25/2017 1030   AST 15 08/21/2016 1405   ALT 10 (L) 05/25/2017 1030   ALT 12 08/21/2016 1405   ALKPHOS 40 05/25/2017 1030   ALKPHOS 48 08/21/2016 1405   BILITOT 2.6 (H) 05/25/2017 1030   BILITOT 2.17 (H) 08/21/2016 1405   GFRNONAA >60 02/25/2018 1317   GFRNONAA >60 10/22/2012 0146   GFRAA >60 02/25/2018 1317   GFRAA >60 10/22/2012 0146   Lab Results  Component Value Date   WBC 3.2 (L) 06/07/2017   NEUTROABS 1.7 05/25/2017   HGB 13.7 06/07/2017   HCT 38.9 (L) 06/07/2017   MCV 85.9 06/07/2017   PLT 191 06/07/2017    NCS (NERVE CONDUCTION STUDY) WITH EMG (ELECTROMYOGRAPHY) REPORT  STUDY DATE: 02/28/18 PATIENT NAME: [name] DOB: 05-21-1976 MRN: [id number]  ORDERING CLINICIAN: Merrilyn Puma, MD  TECHNOLOGIST: Oneita Jolly ELECTROMYOGRAPHER: Earlean Polka. 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  Now with new onset bilateral upper and lower extremity weakness and muscle twitching for last few months. Exam notable for proximal muscle weakness, rare fasciculations in right deltoid and right gastrocnemius, and normal sensation.  Muscle stretch reflexes are decreased in upper and lower extremities.   FINDINGS: NERVE CONDUCTION STUDY: Bilateral median, bilateral ulnar, bilateral peroneal and bilateral tibial motor responses are normal.  Bilateral radial, bilateral median, bilateral ulnar, bilateral sural and bilateral superficial peroneal sensory responses are normal.  Bilateral medial antebrachial cutaneous nerves of the forearm are normal.  Right lateral antebrachial cutaneous nerve forearm is normal.  Left antebrachial cutaneous nerve of forearm has normal peak latency and decreased amplitude.  Left spinal accessory motor response has slightly prolonged distal latency and decreased amplitude.   NEEDLE ELECTROMYOGRAPHY:  Needle examination of bilateral upper and right lower extremities, cervical, thoracic and lumbar paraspinal muscles was performed.  See table for further details.  No abnormal spontaneous activity noted in all muscle groups tested except for 1+ positive sharp waves in right biceps and rare fasciculations in right gastrocnemius muscles.  Motor unit recruitment was normal in all muscle groups tested except for early recruitment with small and polyphasic motor units in the following muscles: Left deltoid, biceps, left triceps, right deltoid, right biceps, right triceps, right vastus medialis, right gastrocnemius muscles.  Also left trapezius muscle notable for decreased recruitment of large motor units on exertion.  Bilateral cervical, right thoracic, right lumbar paraspinal muscles are normal.   IMPRESSION:   Abnormal study  demonstrating: 1. 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  Previously on electrodiagnostic study there was no motor response recorded, but now motor response is able to be recorded.  Previously spontaneous activity was noted on needle EMG, and now chronic denervation pattern is noted.  This may represent healing / recovery process.  2. Left antebrachial cutaneous nerve of forearm is slightly abnormal.  Remaining nerve conduction studies of left upper extremity are normal.  No other current electrodiagnostic evidence of left brachial plexopathy at this time, although left brachial plexopathy was diagnosed on MRI study in the past.  3. Bilateral upper and right lower extremity needle EMG study notable for myopathic recruitment pattern in proximal muscles.  Fasciculations were noted in right gastrocnemius muscle. These findings correlate with patient's new onset of generalized muscle weakness and muscle twitching.  Considerations would include steroid myopathy, inflammatory myopathy, muscle trauma, toxic or endocrine etiologies.  4. No electrodiagnostic evidence of underlying widespread polyneuropathy or polyradiculopathy.   INTERPRETING PHYSICIAN:  Penni Bombard, MD Certified in Neurology, Neurophysiology and Neuroimaging  [facility] [address], [place], [place] [address] [phone]   Assessment/Plan 1. Radiation-induced brachial plexopathy  2. Fasiculations/myopathy  [name] had an EMG which demonstrated a diffuse myopathic pattern with polyphasic motor units and decreased recruitment of motor units. It is unclear what the underlying etiology is at this time.  Reviewing medications and re-appropriating the clinical history today, there does not seem to be an exogenous toxin responsible (ie. Alcohol, statin, steroid, malaria drugs).  It is also interesting that there is a lack of myalgia, and clear  presence of sensory symptoms in the feet and legs.  Potential etiologies include inflammatory, endocrine, infectious (viral/HIV) and paraneoplastic.  Will check serum CK, aldolase, cortisol, Vit-D, HIV, ESR, CRP, ANA, anti-Ro, anti-La, magnesium.  Will refer to Dr. Christella Noa for muscle biopsy, like right gastrocnemius.    For now will continue with Trental and Vitaimin  E and follow up after testing, referral.  We appreciate the opportunity to participate in the care of [name].   All questions were answered. The patient knows to call the clinic with any problems, questions or concerns. No barriers to learning were detected.  The total time spent in the appointment was 40 minutes and more than 50% was on counseling and review of test results  Ventura Sellers, MD Medical Director of Neuro-Oncology [facility] at [place] 03/07/18 2:00 PM

## 2018-03-08 ENCOUNTER — Telehealth: Payer: Self-pay

## 2018-03-08 ENCOUNTER — Telehealth: Payer: Self-pay | Admitting: *Deleted

## 2018-03-08 NOTE — Telephone Encounter (Signed)
Per 2/24 no los

## 2018-03-08 NOTE — Telephone Encounter (Signed)
Oncology Nurse Navigator Documentation  Per Dr. Isidore Moos, called pt to inform him yesterday's pelvic CT indicated no evidence of cancer; chest CT showed no evidence of metastatic disease though indicated presence of gall stones for which Dr. Isidore Moos will consult re the need for follow-up with gastroenterologist.  He voiced appreciation for the call.  [name], RN, BSN Head & Neck Oncology Nurse [place] at [place] [phone]

## 2018-03-12 ENCOUNTER — Telehealth: Payer: Self-pay | Admitting: *Deleted

## 2018-03-13 ENCOUNTER — Other Ambulatory Visit: Payer: Self-pay | Admitting: Radiation Oncology

## 2018-03-13 DIAGNOSIS — K8020 Calculus of gallbladder without cholecystitis without obstruction: Secondary | ICD-10-CM

## 2018-03-13 NOTE — Telephone Encounter (Signed)
Oncology Nurse Navigator Documentation  Pt called asking if there is to be a follow-up re recent dx of gallstones.  I explained Dr. Isidore Moos has contacted specialists, is waiting for reply, he will be contacted with guidance.  He voiced understanding.  [name], RN, BSN Head & Neck Oncology Nurse [place] at [place] [phone]

## 2018-03-14 ENCOUNTER — Other Ambulatory Visit: Payer: Self-pay | Admitting: *Deleted

## 2018-03-14 ENCOUNTER — Ambulatory Visit
Admission: RE | Admit: 2018-03-14 | Discharge: 2018-03-14 | Disposition: A | Discharge status: Home / Self Care | Payer: Self-pay | Source: Ambulatory Visit | Attending: Internal Medicine | Admitting: Internal Medicine

## 2018-03-14 DIAGNOSIS — C770 Secondary and unspecified malignant neoplasm of lymph nodes of head, face and neck: Secondary | ICD-10-CM

## 2018-03-21 ENCOUNTER — Telehealth: Payer: Self-pay | Admitting: *Deleted

## 2018-03-21 NOTE — Telephone Encounter (Signed)
Oncology Nurse Navigator Documentation  Received forwarded email reply from Dr. Jenne Campus office 6/4 indicating he does not need to see Mr. Buntrock if he is doing well.  He noted 41-month interval CT to be scheduled by Dr. Isidore Moos.  Dr. Isidore Moos and pt informed.  [name], RN, BSN Head & Neck Oncology Nurse [place] at [place] [phone]

## 2018-03-26 ENCOUNTER — Other Ambulatory Visit: Payer: Self-pay | Admitting: Neurosurgery

## 2018-03-26 ENCOUNTER — Other Ambulatory Visit: Payer: Self-pay

## 2018-03-26 ENCOUNTER — Encounter (HOSPITAL_COMMUNITY): Payer: Self-pay | Admitting: *Deleted

## 2018-03-27 ENCOUNTER — Encounter: Payer: Self-pay | Admitting: Gastroenterology

## 2018-03-27 ENCOUNTER — Encounter (HOSPITAL_COMMUNITY)
Admission: RE | Disposition: A | Discharge status: Home / Self Care | Payer: Self-pay | Source: Ambulatory Visit | Attending: Neurosurgery

## 2018-03-27 ENCOUNTER — Encounter (HOSPITAL_COMMUNITY): Payer: Self-pay

## 2018-03-27 ENCOUNTER — Ambulatory Visit (HOSPITAL_COMMUNITY)
Admission: RE | Admit: 2018-03-27 | Discharge: 2018-03-27 | Disposition: A | Discharge status: Home / Self Care | Payer: Managed Care, Other (non HMO) | Source: Ambulatory Visit | Attending: Neurosurgery | Admitting: Neurosurgery

## 2018-03-27 ENCOUNTER — Other Ambulatory Visit: Payer: Self-pay | Admitting: Neurosurgery

## 2018-03-27 ENCOUNTER — Ambulatory Visit (HOSPITAL_COMMUNITY): Payer: Managed Care, Other (non HMO)

## 2018-03-27 DIAGNOSIS — K219 Gastro-esophageal reflux disease without esophagitis: Secondary | ICD-10-CM | POA: Diagnosis not present

## 2018-03-27 DIAGNOSIS — M6281 Muscle weakness (generalized): Secondary | ICD-10-CM | POA: Insufficient documentation

## 2018-03-27 DIAGNOSIS — Z79899 Other long term (current) drug therapy: Secondary | ICD-10-CM | POA: Insufficient documentation

## 2018-03-27 DIAGNOSIS — G729 Myopathy, unspecified: Secondary | ICD-10-CM | POA: Diagnosis present

## 2018-03-27 HISTORY — DX: Cardiac murmur, unspecified: R01.1

## 2018-03-27 HISTORY — DX: Polyneuropathy, unspecified: G62.9

## 2018-03-27 HISTORY — PX: MUSCLE BIOPSY: SHX716

## 2018-03-27 LAB — CBC
HEMATOCRIT: 43.7 % (ref 39.0–52.0)
HEMOGLOBIN: 15.2 g/dL (ref 13.0–17.0)
MCH: 30.6 pg (ref 26.0–34.0)
MCHC: 34.8 g/dL (ref 30.0–36.0)
MCV: 87.9 fL (ref 78.0–100.0)
Platelets: 197 10*3/uL (ref 150–400)
RBC: 4.97 MIL/uL (ref 4.22–5.81)
RDW: 11.6 % (ref 11.5–15.5)
WBC: 3.3 10*3/uL — AB (ref 4.0–10.5)

## 2018-03-27 LAB — BASIC METABOLIC PANEL
Anion gap: 7 (ref 5–15)
BUN: 15 mg/dL (ref 6–20)
CHLORIDE: 104 mmol/L (ref 101–111)
CO2: 28 mmol/L (ref 22–32)
Calcium: 9.4 mg/dL (ref 8.9–10.3)
Creatinine, Ser: 1.09 mg/dL (ref 0.61–1.24)
Glucose, Bld: 87 mg/dL (ref 65–99)
POTASSIUM: 4 mmol/L (ref 3.5–5.1)
Sodium: 139 mmol/L (ref 135–145)

## 2018-03-27 SURGERY — BIOPSY, MUSCLE, GASTROCNEMIUS
Anesthesia: Monitor Anesthesia Care

## 2018-03-27 MED ORDER — PROPOFOL 10 MG/ML IV BOLUS
INTRAVENOUS | Status: AC
  Filled 2018-03-27: qty 20

## 2018-03-27 MED ORDER — OXYCODONE HCL 5 MG/5ML PO SOLN: 5.0000 mg | Freq: Once | ORAL | Status: DC | PRN

## 2018-03-27 MED ORDER — CEFAZOLIN SODIUM-DEXTROSE 2-3 GM-%(50ML) IV SOLR
INTRAVENOUS | Status: DC | PRN
  Administered 2018-03-27: 2 g via INTRAVENOUS

## 2018-03-27 MED ORDER — MIDAZOLAM HCL 5 MG/5ML IJ SOLN
INTRAMUSCULAR | Status: DC | PRN
  Administered 2018-03-27: 2 mg via INTRAVENOUS

## 2018-03-27 MED ORDER — HYDROMORPHONE HCL 2 MG/ML IJ SOLN
INTRAMUSCULAR | Status: AC
  Filled 2018-03-27: qty 1

## 2018-03-27 MED ORDER — 0.9 % SODIUM CHLORIDE (POUR BTL) OPTIME
TOPICAL | Status: DC | PRN
  Administered 2018-03-27: 1000 mL

## 2018-03-27 MED ORDER — HYDROMORPHONE HCL 2 MG/ML IJ SOLN
0.2500 mg | INTRAMUSCULAR | Status: DC | PRN
  Administered 2018-03-27: 0.5 mg via INTRAVENOUS

## 2018-03-27 MED ORDER — LIDOCAINE-EPINEPHRINE 0.5 %-1:200000 IJ SOLN
INTRAMUSCULAR | Status: AC
  Filled 2018-03-27: qty 1

## 2018-03-27 MED ORDER — LIDOCAINE 2% (20 MG/ML) 5 ML SYRINGE
INTRAMUSCULAR | Status: DC | PRN
  Administered 2018-03-27: 80 mg via INTRAVENOUS

## 2018-03-27 MED ORDER — LIDOCAINE-EPINEPHRINE 0.5 %-1:200000 IJ SOLN
INTRAMUSCULAR | Status: DC | PRN
  Administered 2018-03-27: 6 mL

## 2018-03-27 MED ORDER — LACTATED RINGERS IV SOLN
INTRAVENOUS | Status: DC
  Administered 2018-03-27: 12:00:00 via INTRAVENOUS

## 2018-03-27 MED ORDER — DEXAMETHASONE SODIUM PHOSPHATE 10 MG/ML IJ SOLN
INTRAMUSCULAR | Status: AC
  Filled 2018-03-27: qty 1

## 2018-03-27 MED ORDER — ONDANSETRON HCL 4 MG/2ML IJ SOLN
INTRAMUSCULAR | Status: DC | PRN
  Administered 2018-03-27: 4 mg via INTRAVENOUS

## 2018-03-27 MED ORDER — FENTANYL CITRATE (PF) 250 MCG/5ML IJ SOLN
INTRAMUSCULAR | Status: DC | PRN
  Administered 2018-03-27: 100 ug via INTRAVENOUS

## 2018-03-27 MED ORDER — SUGAMMADEX SODIUM 200 MG/2ML IV SOLN
INTRAVENOUS | Status: DC | PRN
  Administered 2018-03-27: 140 mg via INTRAVENOUS

## 2018-03-27 MED ORDER — DEXAMETHASONE SODIUM PHOSPHATE 10 MG/ML IJ SOLN
INTRAMUSCULAR | Status: DC | PRN
  Administered 2018-03-27: 5 mg via INTRAVENOUS

## 2018-03-27 MED ORDER — ONDANSETRON HCL 4 MG/2ML IJ SOLN
INTRAMUSCULAR | Status: AC
Start: 2018-03-27 — End: ?
  Filled 2018-03-27: qty 2

## 2018-03-27 MED ORDER — ROCURONIUM BROMIDE 10 MG/ML (PF) SYRINGE
PREFILLED_SYRINGE | INTRAVENOUS | Status: AC
  Filled 2018-03-27: qty 10

## 2018-03-27 MED ORDER — PROPOFOL 10 MG/ML IV BOLUS
INTRAVENOUS | Status: DC | PRN
  Administered 2018-03-27: 100 mg via INTRAVENOUS
  Administered 2018-03-27 (×2): 50 mg via INTRAVENOUS

## 2018-03-27 MED ORDER — PROMETHAZINE HCL 25 MG/ML IJ SOLN: 6.2500 mg | INTRAMUSCULAR | Status: DC | PRN

## 2018-03-27 MED ORDER — PROPOFOL 1000 MG/100ML IV EMUL
INTRAVENOUS | Status: AC
  Filled 2018-03-27: qty 100

## 2018-03-27 MED ORDER — SUGAMMADEX SODIUM 200 MG/2ML IV SOLN
INTRAVENOUS | Status: AC
  Filled 2018-03-27: qty 2

## 2018-03-27 MED ORDER — FENTANYL CITRATE (PF) 250 MCG/5ML IJ SOLN
INTRAMUSCULAR | Status: AC
Start: 2018-03-27 — End: ?
  Filled 2018-03-27: qty 5

## 2018-03-27 MED ORDER — LIDOCAINE 2% (20 MG/ML) 5 ML SYRINGE
INTRAMUSCULAR | Status: AC
  Filled 2018-03-27: qty 5

## 2018-03-27 MED ORDER — OXYCODONE HCL 5 MG PO TABS: 5.0000 mg | ORAL_TABLET | Freq: Once | ORAL | Status: DC | PRN

## 2018-03-27 MED ORDER — CEFAZOLIN SODIUM 1 G IJ SOLR
INTRAMUSCULAR | Status: AC
  Filled 2018-03-27: qty 20

## 2018-03-27 MED ORDER — MIDAZOLAM HCL 2 MG/2ML IJ SOLN
INTRAMUSCULAR | Status: AC
  Filled 2018-03-27: qty 2

## 2018-03-27 MED ORDER — SODIUM CHLORIDE 0.9 % IJ SOLN
INTRAMUSCULAR | Status: AC
  Filled 2018-03-27: qty 20

## 2018-03-27 MED ORDER — ROCURONIUM BROMIDE 10 MG/ML (PF) SYRINGE
PREFILLED_SYRINGE | INTRAVENOUS | Status: DC | PRN
  Administered 2018-03-27: 20 mg via INTRAVENOUS

## 2018-03-27 SURGICAL SUPPLY — 68 items
BANDAGE ACE 4X5 VEL STRL LF (GAUZE/BANDAGES/DRESSINGS) IMPLANT
BANDAGE ACE 6X5 VEL STRL LF (GAUZE/BANDAGES/DRESSINGS) IMPLANT
BENZOIN TINCTURE PRP APPL 2/3 (GAUZE/BANDAGES/DRESSINGS) ×3 IMPLANT
BLADE CLIPPER SURG (BLADE) IMPLANT
BLADE SURG 15 STRL LF DISP TIS (BLADE) ×1 IMPLANT
BLADE SURG 15 STRL SS (BLADE) ×2
BNDG GAUZE ELAST 4 BULKY (GAUZE/BANDAGES/DRESSINGS) ×3 IMPLANT
CABLE BIPOLOR RESECTION CORD (MISCELLANEOUS) ×3 IMPLANT
CARTRIDGE OIL MAESTRO DRILL (MISCELLANEOUS) ×1 IMPLANT
CLOSURE WOUND 1/2 X4 (GAUZE/BANDAGES/DRESSINGS) ×1
DECANTER SPIKE VIAL GLASS SM (MISCELLANEOUS) ×3 IMPLANT
DERMABOND ADVANCED (GAUZE/BANDAGES/DRESSINGS) ×2
DERMABOND ADVANCED .7 DNX12 (GAUZE/BANDAGES/DRESSINGS) ×1 IMPLANT
DIFFUSER DRILL AIR PNEUMATIC (MISCELLANEOUS) ×3 IMPLANT
DRAPE HALF SHEET 40X57 (DRAPES) ×3 IMPLANT
DRAPE INCISE IOBAN 66X45 STRL (DRAPES) ×3 IMPLANT
DRAPE LAPAROTOMY 100X72 PEDS (DRAPES) IMPLANT
DURAPREP 26ML APPLICATOR (WOUND CARE) ×3 IMPLANT
GAUZE SPONGE 4X4 16PLY XRAY LF (GAUZE/BANDAGES/DRESSINGS) ×3 IMPLANT
GLOVE BIO SURGEON STRL SZ 6.5 (GLOVE) IMPLANT
GLOVE BIO SURGEON STRL SZ7 (GLOVE) IMPLANT
GLOVE BIO SURGEON STRL SZ7.5 (GLOVE) IMPLANT
GLOVE BIO SURGEON STRL SZ8 (GLOVE) IMPLANT
GLOVE BIO SURGEON STRL SZ8.5 (GLOVE) IMPLANT
GLOVE BIO SURGEONS STRL SZ 6.5 (GLOVE)
GLOVE BIOGEL M 8.0 STRL (GLOVE) IMPLANT
GLOVE ECLIPSE 6.5 STRL STRAW (GLOVE) ×6 IMPLANT
GLOVE ECLIPSE 7.0 STRL STRAW (GLOVE) IMPLANT
GLOVE ECLIPSE 7.5 STRL STRAW (GLOVE) IMPLANT
GLOVE ECLIPSE 8.0 STRL XLNG CF (GLOVE) IMPLANT
GLOVE ECLIPSE 8.5 STRL (GLOVE) IMPLANT
GLOVE EXAM NITRILE LRG STRL (GLOVE) IMPLANT
GLOVE EXAM NITRILE XL STR (GLOVE) IMPLANT
GLOVE EXAM NITRILE XS STR PU (GLOVE) IMPLANT
GLOVE INDICATOR 6.5 STRL GRN (GLOVE) IMPLANT
GLOVE INDICATOR 7.0 STRL GRN (GLOVE) IMPLANT
GLOVE INDICATOR 7.5 STRL GRN (GLOVE) IMPLANT
GLOVE INDICATOR 8.0 STRL GRN (GLOVE) IMPLANT
GLOVE INDICATOR 8.5 STRL (GLOVE) IMPLANT
GLOVE OPTIFIT SS 8.0 STRL (GLOVE) IMPLANT
GLOVE SURG SS PI 6.5 STRL IVOR (GLOVE) IMPLANT
GOWN STRL REUS W/ TWL LRG LVL3 (GOWN DISPOSABLE) ×1 IMPLANT
GOWN STRL REUS W/ TWL XL LVL3 (GOWN DISPOSABLE) IMPLANT
GOWN STRL REUS W/TWL 2XL LVL3 (GOWN DISPOSABLE) IMPLANT
GOWN STRL REUS W/TWL LRG LVL3 (GOWN DISPOSABLE) ×2
GOWN STRL REUS W/TWL XL LVL3 (GOWN DISPOSABLE)
KIT BASIN OR (CUSTOM PROCEDURE TRAY) ×3 IMPLANT
KIT TURNOVER KIT B (KITS) ×3 IMPLANT
MARKER SKIN DUAL TIP RULER LAB (MISCELLANEOUS) ×3 IMPLANT
NEEDLE HYPO 25X1 1.5 SAFETY (NEEDLE) ×3 IMPLANT
NS IRRIG 1000ML POUR BTL (IV SOLUTION) ×3 IMPLANT
OIL CARTRIDGE MAESTRO DRILL (MISCELLANEOUS) ×3
PACK SURGICAL SETUP 50X90 (CUSTOM PROCEDURE TRAY) ×3 IMPLANT
PAD ARMBOARD 7.5X6 YLW CONV (MISCELLANEOUS) ×3 IMPLANT
SPECIMEN JAR SMALL (MISCELLANEOUS) ×3 IMPLANT
STRIP CLOSURE SKIN 1/2X4 (GAUZE/BANDAGES/DRESSINGS) ×2 IMPLANT
SUT SILK 0 TIES 10X30 (SUTURE) ×3 IMPLANT
SUT SILK 2 0 TIES 10X30 (SUTURE) ×3 IMPLANT
SUT SILK 3 0 TIES 17X18 (SUTURE) ×2
SUT SILK 3-0 18XBRD TIE BLK (SUTURE) ×1 IMPLANT
SUT VIC AB 2-0 CT1 27 (SUTURE) ×2
SUT VIC AB 2-0 CT1 27XBRD (SUTURE) ×1 IMPLANT
SUT VIC AB 3-0 SH 8-18 (SUTURE) ×3 IMPLANT
SYR BULB 3OZ (MISCELLANEOUS) ×3 IMPLANT
SYR CONTROL 10ML LL (SYRINGE) ×3 IMPLANT
TOWEL GREEN STERILE (TOWEL DISPOSABLE) ×3 IMPLANT
TOWEL GREEN STERILE FF (TOWEL DISPOSABLE) ×3 IMPLANT
WATER STERILE IRR 1000ML POUR (IV SOLUTION) ×3 IMPLANT

## 2018-03-27 NOTE — Anesthesia Postprocedure Evaluation (Signed)
Anesthesia Post Note  Patient: [name]  Procedure(s) Performed: RIGHT GASTROCNEMIUS LEFT BICEP MUSCLE BIOPSY (N/A )     Patient location during evaluation: PACU Anesthesia Type: General Level of consciousness: awake and alert Pain management: pain level controlled Vital Signs Assessment: post-procedure vital signs reviewed and stable Respiratory status: spontaneous breathing, nonlabored ventilation, respiratory function stable and patient connected to nasal cannula oxygen Cardiovascular status: blood pressure returned to baseline and stable Postop Assessment: no apparent nausea or vomiting Anesthetic complications: no    Last Vitals:  Vitals:   03/27/18 1537 03/27/18 1540  BP:    Pulse: 69   Resp: 10   Temp:  (!) 36.3 C  SpO2: 100%     Last Pain:  Vitals:   03/27/18 1540  TempSrc:   PainSc: 2                  Ryan P [name]

## 2018-03-27 NOTE — Op Note (Signed)
03/27/2018  5:59 PM  PATIENT:  [name]  42 y.o. male with myopathy of unknown etiology admitted for a right gastrocnemius muscle biopsy  PRE-OPERATIVE DIAGNOSIS:  MYOPATHY  POST-OPERATIVE DIAGNOSIS:  MYOPATHY  PROCEDURE:  Procedure(s): RIGHT GASTROCNEMIUS LEFT BICEP MUSCLE BIOPSY  SURGEON: Surgeon(s): Ashok Pall, MD  ASSISTANTS:none  ANESTHESIA:   general  EBL:  Total I/O In: 800 [I.V.:800] Out: 20 [Blood:20]  BLOOD ADMINISTERED:none  COUNT:correct  DRAINS: none   SPECIMEN:  Source of Specimen:  right gastrocnemius muscle  DICTATION: Lannette Donath was taken to the operating room, intubated, and placed under a general anesthetic without difficulty. He was positioned prone on flat rolls so that all pressure points were properly padded. His right calf was prepped and draped in a sterile manner. I made a horizontal incision over the bed of the gastrocnemius muscle with a 10 blade. I dissected through the fascia, dividing it horizontally. I separated some muscle fascicles and took two pieces of the muscle. The muscle was sent to pathology. I then injected the muscle with lidocaine, irrigated, and closed the wound. I approximated the fascia with vicryl sutures. I approximated the subcutaneous and subcuticular planes with vicryl suture. I used dermabond for a sterile dressing.  PLAN OF CARE: Discharge to home after PACU  PATIENT DISPOSITION:  PACU - hemodynamically stable.   Delay start of Pharmacological VTE agent (>24hrs) due to surgical blood loss or risk of bleeding:  yes

## 2018-03-27 NOTE — H&P (Signed)
He is 5 feet, 9.5 inches, weighs 154 pounds. Blood pressure is 119/78, pulse 65, temperature is 96.4.  He has headaches, muscle weakness, pain in the left groin region, sharp pain when he sleeps or stands. He has had throat cancer. He is 42 years of age, works as a Freight forwarder at [employer].  He takes Pentoxifylline, Methylphenidate and Vitamin E. FAMILY HISTORY: Mother is 45, in good health. REVIEW OF SYSTEMS: Positive for night sweats, balance problems, heart murmur, leg pain with walking, abdominal pain, neck pain, arm weakness, leg weakness, back pain, arm pain, leg pain, joint pain, disorientation, difficulty with speech, blurred vision, coordination problems in arms and legs, anxiety and depression. ASSESSMENT: [name] is awaiting the biopsy, so he can move on with this. He has had a left brachial plexopathy. So there are a number of issues which he is still waiting on to figure out what is or is not happening. No Known Allergies

## 2018-03-27 NOTE — Transfer of Care (Signed)
Immediate Anesthesia Transfer of Care Note  Patient: [name]  Procedure(s) Performed: RIGHT GASTROCNEMIUS LEFT BICEP MUSCLE BIOPSY (N/A )  Patient Location: PACU  Anesthesia Type:General  Level of Consciousness: awake, alert  and oriented  Airway & Oxygen Therapy: Patient Spontanous Breathing and Patient connected to nasal cannula oxygen  Post-op Assessment: Report given to RN and Post -op Vital signs reviewed and stable  Post vital signs: Reviewed and stable  Last Vitals:  Vitals Value Taken Time  BP 131/77 03/27/2018  2:47 PM  Temp    Pulse 68 03/27/2018  2:48 PM  Resp 15 03/27/2018  2:48 PM  SpO2 100 % 03/27/2018  2:48 PM  Vitals shown include unvalidated device data.  Last Pain:  Vitals:   03/27/18 1120  TempSrc:   PainSc: 3          Complications: No apparent anesthesia complications

## 2018-03-27 NOTE — Anesthesia Preprocedure Evaluation (Addendum)
Anesthesia Evaluation  Patient identified by MRN, date of birth, ID band Patient awake    Reviewed: Allergy & Precautions, NPO status , Patient's Chart, lab work & pertinent test results  Airway Mallampati: III  TM Distance: >3 FB Neck ROM: Full    Dental no notable dental hx.    Pulmonary  tosillar cancer, s/p radiation therapy and surgery   Pulmonary exam normal breath sounds clear to auscultation       Cardiovascular negative cardio ROS Normal cardiovascular exam+ Valvular Problems/Murmurs MVP  Rhythm:Regular Rate:Normal  ECG: NSR, rate 68  ECHO: Left ventricle: The cavity size was normal. Wall thickness was normal. Systolic function was mildly reduced. The estimated ejection fraction was in the range of 45% to 50%. Mitral valve: The MV leaflets are thickened with bileaflet prolapse There is mild to mod MR directed posterior into LA.   Neuro/Psych  Neuromuscular disease negative psych ROS   GI/Hepatic Neg liver ROS, GERD  Controlled,  Endo/Other  negative endocrine ROS  Renal/GU negative Renal ROS     Musculoskeletal negative musculoskeletal ROS (+)   Abdominal   Peds  Hematology negative hematology ROS (+)   Anesthesia Other Findings MYOPATHY  Reproductive/Obstetrics                            Anesthesia Physical Anesthesia Plan  ASA: III  Anesthesia Plan: General   Post-op Pain Management:    Induction: Intravenous  PONV Risk Score and Plan: 2 and Treatment may vary due to age or medical condition, Propofol infusion, Ondansetron and Dexamethasone  Airway Management Planned: Oral ETT and Video Laryngoscope Planned  Additional Equipment:   Intra-op Plan:   Post-operative Plan:   Informed Consent: I have reviewed the patients History and Physical, chart, labs and discussed the procedure including the risks, benefits and alternatives for the proposed anesthesia with the  patient or authorized representative who has indicated his/her understanding and acceptance.   Dental advisory given  Plan Discussed with: CRNA  Anesthesia Plan Comments:       Anesthesia Quick Evaluation

## 2018-03-27 NOTE — Anesthesia Procedure Notes (Signed)
Procedure Name: Intubation Date/Time: 03/27/2018 1:58 PM Performed by: Imagene Riches, CRNA Pre-anesthesia Checklist: Patient identified, Emergency Drugs available, Suction available and Patient being monitored Patient Re-evaluated:Patient Re-evaluated prior to induction Oxygen Delivery Method: Circle System Utilized Preoxygenation: Pre-oxygenation with 100% oxygen Induction Type: IV induction Ventilation: Mask ventilation without difficulty and Oral airway inserted - appropriate to patient size Laryngoscope Size: Glidescope and 3 Grade View: Grade I Tube type: Oral Tube size: 7.5 mm Number of attempts: 1 Airway Equipment and Method: Stylet and Oral airway Placement Confirmation: ETT inserted through vocal cords under direct vision,  positive ETCO2 and breath sounds checked- equal and bilateral Secured at: 21 cm Tube secured with: Tape Dental Injury: Teeth and Oropharynx as per pre-operative assessment  Comments: Elective glide due to history of neck radiation

## 2018-03-28 ENCOUNTER — Encounter (HOSPITAL_COMMUNITY): Payer: Self-pay | Admitting: Neurosurgery

## 2018-03-29 ENCOUNTER — Telehealth: Payer: Self-pay | Admitting: *Deleted

## 2018-03-29 ENCOUNTER — Ambulatory Visit: Payer: Self-pay | Admitting: Radiation Oncology

## 2018-03-29 ENCOUNTER — Other Ambulatory Visit: Payer: Self-pay | Admitting: Internal Medicine

## 2018-03-29 MED ORDER — METHYLPHENIDATE HCL 5 MG PO TABS: 5.0000 mg | ORAL_TABLET | Freq: Two times a day (BID) | ORAL | 0 refills | Status: DC

## 2018-03-29 NOTE — Telephone Encounter (Signed)
Returned patient's phone call regarding MRI report. He would like to discuss this with Dr. Mickeal Skinner and the pain he is having. 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 Also, I need a refill on Ritalin sent to the [facility] in [place]. My return number is [phone]. I told him I would give this message to Dr. Mickeal Skinner. He verbalized understanding.

## 2018-04-08 ENCOUNTER — Other Ambulatory Visit: Payer: Self-pay | Admitting: General Surgery

## 2018-04-15 ENCOUNTER — Encounter (HOSPITAL_COMMUNITY): Payer: Self-pay | Admitting: Neurosurgery

## 2018-04-15 ENCOUNTER — Telehealth: Payer: Self-pay | Admitting: Internal Medicine

## 2018-04-15 NOTE — Telephone Encounter (Signed)
Called regarding 7/3

## 2018-04-15 NOTE — Pre-Procedure Instructions (Addendum)
BARRON VANLOAN  04/15/2018      [id number] [place] [address] [place] [address] Phone: [phone] Fax: [phone]    Your procedure is scheduled on July 8  Report to [place] at 1000 A.M.  Call this number if you have problems the morning of surgery:  [phone]   Remember:  Do not eat after midnight.  Clear Liquids until 900 am the  Morning of surgery (water, Gatorade)  Please drink the pre-surgery ensure by 9 am the morning of surgery    Take these medicines the morning of surgery with A SIP OF WATER  acetaminophen (TYLENOL gabapentin (NEURONTIN)  HYDROcodone-acetaminophen (NORCO/VICODIN)  ranitidine (ZANTAC)  7 days prior to surgery STOP taking any Aspirin(unless otherwise instructed by your surgeon), Aleve, Naproxen, Ibuprofen, Motrin, Advil, Goody's, BC's, all herbal medications, fish oil, and all vitamins     Do not wear jewelry  Do not wear lotions, powders, or COLOGNE, or deodorant.  Men may shave face and neck.  Do not bring valuables to the hospital.  [facility] is not responsible for any belongings or valuables.  Contacts, dentures or bridgework may not be worn into surgery.  Leave your suitcase in the car.  After surgery it may be brought to your room.  For patients admitted to the hospital, discharge time will be determined by your treatment team.  Patients discharged the day of surgery will not be allowed to drive home.    Special instructions:   [facility]- Preparing For Surgery  Before surgery, you can play an important role. Because skin is not sterile, your skin needs to be as free of germs as possible. You can reduce the number of germs on your skin by washing with CHG (chlorahexidine gluconate) Soap before surgery.  CHG is an antiseptic cleaner which kills germs and bonds with the skin to continue killing germs even after washing.    Oral Hygiene is also important to  reduce your risk of infection.  Remember - BRUSH YOUR TEETH THE MORNING OF SURGERY WITH YOUR REGULAR TOOTHPASTE  Please do not use if you have an allergy to CHG or antibacterial soaps. If your skin becomes reddened/irritated stop using the CHG.  Do not shave (including legs and underarms) for at least 48 hours prior to first CHG shower. It is OK to shave your face.  Please follow these instructions carefully.   1. Shower the NIGHT BEFORE SURGERY and the MORNING OF SURGERY with CHG.   2. If you chose to wash your hair, wash your hair first as usual with your normal shampoo.  3. After you shampoo, rinse your hair and body thoroughly to remove the shampoo.  4. Use CHG as you would any other liquid soap. You can apply CHG directly to the skin and wash gently with a scrungie or a clean washcloth.   5. Apply the CHG Soap to your body ONLY FROM THE NECK DOWN.  Do not use on open wounds or open sores. Avoid contact with your eyes, ears, mouth and genitals (private parts). Wash Face and genitals (private parts)  with your normal soap.  6. Wash thoroughly, paying special attention to the area where your surgery will be performed.  7. Thoroughly rinse your body with warm water from the neck down.  8. DO NOT shower/wash with your normal soap after using and rinsing off the CHG Soap.  9. Pat yourself dry with a CLEAN  TOWEL.  10. Wear CLEAN PAJAMAS to bed the night before surgery, wear comfortable clothes the morning of surgery  11. Place CLEAN SHEETS on your bed the night of your first shower and DO NOT SLEEP WITH PETS.    Day of Surgery:  Do not apply any deodorants/lotions.  Please wear clean clothes to the hospital/surgery center.   Remember to brush your teeth WITH YOUR REGULAR TOOTHPASTE.    Please read over the following fact sheets that you were given.

## 2018-04-16 ENCOUNTER — Other Ambulatory Visit: Payer: Self-pay

## 2018-04-16 ENCOUNTER — Encounter (HOSPITAL_COMMUNITY): Payer: Self-pay

## 2018-04-16 ENCOUNTER — Encounter (HOSPITAL_COMMUNITY)
Admission: RE | Admit: 2018-04-16 | Discharge: 2018-04-16 | Disposition: A | Discharge status: Home / Self Care | Payer: Managed Care, Other (non HMO) | Source: Ambulatory Visit | Attending: General Surgery | Admitting: General Surgery

## 2018-04-16 DIAGNOSIS — R011 Cardiac murmur, unspecified: Secondary | ICD-10-CM | POA: Diagnosis present

## 2018-04-16 DIAGNOSIS — R9431 Abnormal electrocardiogram [ECG] [EKG]: Secondary | ICD-10-CM | POA: Insufficient documentation

## 2018-04-16 DIAGNOSIS — I341 Nonrheumatic mitral (valve) prolapse: Secondary | ICD-10-CM | POA: Diagnosis not present

## 2018-04-16 LAB — CBC WITH DIFFERENTIAL/PLATELET
Abs Immature Granulocytes: 0 10*3/uL (ref 0.0–0.1)
BASOS ABS: 0 10*3/uL (ref 0.0–0.1)
BASOS PCT: 0 %
EOS ABS: 0 10*3/uL (ref 0.0–0.7)
Eosinophils Relative: 1 %
HCT: 43.4 % (ref 39.0–52.0)
Hemoglobin: 14.4 g/dL (ref 13.0–17.0)
IMMATURE GRANULOCYTES: 0 %
LYMPHS ABS: 0.7 10*3/uL (ref 0.7–4.0)
LYMPHS PCT: 15 %
MCH: 30.2 pg (ref 26.0–34.0)
MCHC: 33.2 g/dL (ref 30.0–36.0)
MCV: 91 fL (ref 78.0–100.0)
MONOS PCT: 10 %
Monocytes Absolute: 0.5 10*3/uL (ref 0.1–1.0)
Neutro Abs: 3.4 10*3/uL (ref 1.7–7.7)
Neutrophils Relative %: 74 %
PLATELETS: 233 10*3/uL (ref 150–400)
RBC: 4.77 MIL/uL (ref 4.22–5.81)
RDW: 11.9 % (ref 11.5–15.5)
WBC: 4.6 10*3/uL (ref 4.0–10.5)

## 2018-04-16 LAB — COMPREHENSIVE METABOLIC PANEL
ALBUMIN: 4.3 g/dL (ref 3.5–5.0)
ALT: 15 U/L (ref 0–44)
ANION GAP: 9 (ref 5–15)
AST: 19 U/L (ref 15–41)
Alkaline Phosphatase: 39 U/L (ref 38–126)
BILIRUBIN TOTAL: 2 mg/dL — AB (ref 0.3–1.2)
BUN: 14 mg/dL (ref 6–20)
CHLORIDE: 106 mmol/L (ref 98–111)
CO2: 27 mmol/L (ref 22–32)
Calcium: 9.7 mg/dL (ref 8.9–10.3)
Creatinine, Ser: 1.07 mg/dL (ref 0.61–1.24)
GFR calc Af Amer: >60 mL/min (ref >60)
GFR calc non Af Amer: >60 mL/min (ref >60)
Glucose, Bld: 101 mg/dL — ABNORMAL HIGH (ref 70–99)
POTASSIUM: 5 mmol/L (ref 3.5–5.1)
Sodium: 142 mmol/L (ref 135–145)
Total Protein: 6.8 g/dL (ref 6.5–8.1)

## 2018-04-16 NOTE — Progress Notes (Signed)
PCP - Donnie Coffin Cardiologist - denies  Chest x-ray - not needed EKG - 04/16/18 Stress Test - denies  ECHO - 2019 Cardiac Cathdenies -   Anesthesia review: no  Patient denies shortness of breath, fever, cough and chest pain at PAT appointment   Patient verbalized understanding of instructions that were given to them at the PAT appointment. Patient was also instructed that they will need to review over the PAT instructions again at home before surgery.

## 2018-04-17 ENCOUNTER — Inpatient Hospital Stay: Payer: Managed Care, Other (non HMO) | Attending: Internal Medicine | Admitting: Internal Medicine

## 2018-04-17 ENCOUNTER — Telehealth: Payer: Self-pay | Admitting: Internal Medicine

## 2018-04-17 VITALS — BP 126/76 | HR 88 | Temp 99.0°F | Resp 18 | Ht 70.0 in | Wt 148.3 lb

## 2018-04-17 DIAGNOSIS — G540 Brachial plexus disorders: Secondary | ICD-10-CM | POA: Diagnosis present

## 2018-04-17 DIAGNOSIS — W888XXA Exposure to other ionizing radiation, initial encounter: Secondary | ICD-10-CM | POA: Diagnosis not present

## 2018-04-17 NOTE — Progress Notes (Signed)
[facility] at [place], [place] [address] [phone]   Interval Evaluation  Date of Service: 04/17/18 Patient Name: [name] Patient MRN: [id number] Patient DOB: 02/12/1976 Provider: Ventura Sellers, MD  Identifying Statement:  [name] is a 42 y.o. 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  Tonsils symmetrically prominent for age.       03/27/2016 Initial Biopsy    Left neck mass (consult slides), fine needle aspirate: Malignant cells present, favor squamous cell carcinoma.       04/07/2016 PET scan    Solitary (L) level 2 LN enlarged and exhibits malignant range FDG uptake.  Symmetric uptake in both tonsillar regions. No definite findings identified to suggest primary site of disease. No evidence of metastatic disease to chest, abdomen, pelvis, or bony structures.       04/14/2016 Procedure    Biopsy of left tonsil: (+) invasive squamous cell carcinoma, p16+.   Biopsies of nasopharynx x 4 (benign), base of tongue x 2 (benign), and left oropharynx (benign).       04/21/2016 Initial Diagnosis    Cancer of tonsillar fossa ([facility])      05/22/2016 - 07/05/2016 Radiation Therapy    Tonsils and bilateral neck / 70 Gy in 35 fractions to gross disease, 63 Gy in 35 fractions to high risk nodal echelons, and 56 Gy in 35 fractions to intermediate risk nodal echelons       Interval History:  [name] presents today for follow up after muscle biopsy.  He is more confident now that there has been a decline in strength affecting his right arm and both legs.  He continues to lift  weights at the gym but the amount he's able to lift had dropped considerably.  His left arm is still weak and clumsy as prior.  The numbness in the left hand and arm is static, but there are no sensory symptoms in the remaining limbs. He also continues to describe worsening of diffuse fasciculations involving his arms, legs, back and abdomen.  The fasciculations are particularly prominent on his right upper back.  At times he feels unsteady because he can't feel his feet underneath him adequately.  No complications with the biopsy procedure.  Medications: Current Outpatient Medications on File Prior to Visit  Medication Sig Dispense Refill  . acetaminophen (TYLENOL) 325 MG tablet Take 650 mg by mouth every 6 (six) hours as needed for moderate pain or headache.     . gabapentin (NEURONTIN) 100 MG capsule Take 300 mg by mouth 3 (three) times daily.     Marland Kitchen HYDROcodone-acetaminophen (NORCO/VICODIN) 5-325 MG tablet Take 1 tablet by mouth 2 (two) times daily as needed for moderate pain.    . methylphenidate (RITALIN) 5 MG tablet Take 1 tablet (5 mg total) by mouth 2 (two) times daily. 60 tablet 0  . pentoxifylline (TRENTAL) 400 MG CR tablet Take 452m tab daily x 1 week, then 4060mBID; take with food 60 tablet 5  . ranitidine (ZANTAC) 150 MG capsule Take 150 mg by mouth daily as needed for heartburn.     . sodium fluoride (FLUORISHIELD) 1.1 % GEL dental gel  Instill one drop of gel per tooth space of fluoride tray. Place over teeth for 5 minutes. Remove. Spit out excess. Repeat nightly. 120 mL prn  . vitamin E 400 UNIT capsule Take 400 IU daily x 1 week, then 400 IU BID (Patient taking differently: Take 400 Units by mouth 2 (two) times daily. ) 60 capsule 5   No current facility-administered medications on file prior to visit.     Allergies: No Known Allergies Past Medical History:  Past Medical History:  Diagnosis Date  . Back injury due to skiing accident 2004   jet ski accident  . Cancer [facility])     tosillar, s/p radiation therapy  . GERD (gastroesophageal reflux disease)   . Heart murmur    MVP - "nothing to be concerned about" " Had all my life"  . History of radiation therapy 05/22/2016 - 07/05/2016   Tonsils and bilateral neck 70 Gy 35 fractions to gross disease, 63 Gy 35 fractions to high risk nodal echelons, 56 Gy 35 fractions to intermediate risk nodal echelons  . Mitral valve prolapse   . Neuropathy    left arm  . Weight loss    Past Surgical History:  Past Surgical History:  Procedure Laterality Date  . ELBOW SURGERY Right   . MUSCLE BIOPSY N/A 03/27/2018   Procedure: RIGHT GASTROCNEMIUS LEFT BICEP MUSCLE BIOPSY;  Surgeon: Ashok Pall, MD;  Location: [facility];  Service: Neurosurgery;  Laterality: N/A;  RIGHT GASTROCNEMIUS LEFT BICEP MUSCLE BIOPSY  . NASOPHARYNGOSCOPY Left 04/14/2016   Procedure: BASE NASAL NASOPHARYNX;  Surgeon: Jerrell Belfast, MD;  Location: [facility];  Service: ENT;  Laterality: Left;  . NECK DISSECTION Left 07/04/2017   Dr. Conley Canal, [place] Left 04/14/2016   Procedure: TONSILLECTOMY AND BIOPSY OF TONGUE ;  Surgeon: Jerrell Belfast, MD;  Location: [facility];  Service: ENT;  Laterality: Left;   Social History:  Social History   Socioeconomic History  . Marital status: Married    Spouse name: Not on file  . Number of children: 0  . Years of education: 50  . Highest education level: Not on file  Occupational History    Comment: [employer]  . Financial resource strain: Not on file  . Food insecurity:    Worry: Not on file    Inability: Not on file  . Transportation needs:    Medical: Not on file    Non-medical: Not on file  Tobacco Use  . Smoking status: Never Smoker  . Smokeless tobacco: Former Systems developer  . Tobacco comment: snuff- 1 -2 times only  Substance and Sexual Activity  . 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 Drug use: No  . Sexual activity: Not on file  Lifestyle  . Physical activity:    Days per week: Not on file    Minutes per session: Not on file  . Stress: Not on file  Relationships  . Social connections:    Talks on phone: Not on file    Gets together: Not on file    Attends religious service: Not on file    Active member of club or organization: Not on file    Attends meetings of clubs or organizations: Not on file    Relationship status: Not on file  . Intimate partner violence:    Fear of  current or ex partner: Not on file    Emotionally abused: Not on file    Physically abused: Not on file    Forced sexual activity: Not on file  Other Topics Concern  . Not on file  Social History Narrative   Lives with wife   caffeine , rare   Family History:  Family History  Problem Relation Age of Onset  . Cancer Mother        Breast  . Cancer Father        Lung  . Cancer Brother        brain  . Cancer Maternal Grandfather        leukemia    Review of Systems: Constitutional: Denies fevers, chills or abnormal weight loss Eyes: Denies blurriness of vision Ears, nose, mouth, throat, and face: Denies mucositis or sore throat Respiratory: Denies cough, dyspnea or wheezes Cardiovascular: Denies palpitation, chest discomfort or lower extremity swelling Gastrointestinal:  Denies nausea, constipation, diarrhea GU: Denies dysuria or incontinence Skin: Denies abnormal skin rashes Neurological: Per HPI Musculoskeletal: +chronic back pain Behavioral/Psych: ++stress   Physical Exam: Vitals:   04/17/18 0943  BP: 126/76  Pulse: 88  Resp: 18  Temp: 99 F (37.2 C)  SpO2: 100%   KPS: 80. General: Alert, cooperative, pleasant, in no acute distress Head: Craniotomy scar noted, dry and intact. EENT: No conjunctival injection or scleral icterus. Oral mucosa moist Lungs: Resp effort normal Cardiac: Regular rate and  rhythm Abdomen: Soft, non-distended abdomen Skin: No rashes cyanosis or petechiae. Extremities: No clubbing or edema  Neurologic Exam: Mental Status: Awake, alert, attentive to examiner. Oriented to self and environment. Language is fluent with intact comprehension.  Cranial Nerves: Visual acuity is grossly normal. Visual fields are full. Extra-ocular movements intact. No ptosis. Face is symmetric, tongue midline but fasciculations noted. Motor: Tone and bulk are normal aside from mild atrophy of left thenar muscles.  Fasciculations easily visible on right calf, thigh, arm and persistent along scapular ridge. Left arm is 4+/5 proximally at shoulder, and distally there is subtle weakness affecting left hand. Unable to obtain brachiaradialis, biceps or triceps reflexes in the left arm.  Otherwise reflexes trace/diminshed throughout. Intact finger to nose bilaterally Sensory: Impaired to light touch along left arm and hand medially, as well as bilateral lower legs to upper shins. Gait: Normal and tandem gait is normal.   Labs: I have reviewed the data as listed    Component Value Date/Time   NA 142 04/16/2018 1309   NA 139 08/21/2016 1405   K 5.0 04/16/2018 1309   K 4.5 08/21/2016 1405   CL 106 04/16/2018 1309   CL 103 10/22/2012 0146   CO2 27 04/16/2018 1309   CO2 29 08/21/2016 1405   GLUCOSE 101 (H) 04/16/2018 1309   GLUCOSE 93 08/21/2016 1405   BUN 14 04/16/2018 1309   BUN 10.8 08/21/2016 1405   CREATININE 1.07 04/16/2018 1309   CREATININE 1.05 02/25/2018 1317   CREATININE 0.8 08/21/2016 1405   CALCIUM 9.7 04/16/2018 1309   CALCIUM 9.9 08/21/2016 1405   PROT 6.8 04/16/2018 1309   PROT 7.3 08/21/2016 1405   ALBUMIN 4.3 04/16/2018 1309   ALBUMIN 4.2 08/21/2016 1405   AST 19 04/16/2018 1309   AST 15 08/21/2016 1405   ALT 15 04/16/2018 1309   ALT 12 08/21/2016 1405   ALKPHOS 39 04/16/2018 1309   ALKPHOS 48 08/21/2016 1405   BILITOT 2.0 (H) 04/16/2018 1309   BILITOT 2.17 (H)  08/21/2016  1405   GFRNONAA >60 04/16/2018 1309   GFRNONAA >60 02/25/2018 1317   GFRNONAA >60 10/22/2012 0146   GFRAA >60 04/16/2018 1309   GFRAA >60 02/25/2018 1317   GFRAA >60 10/22/2012 0146   Lab Results  Component Value Date   WBC 4.6 04/16/2018   NEUTROABS 3.4 04/16/2018   HGB 14.4 04/16/2018   HCT 43.4 04/16/2018   MCV 91.0 04/16/2018   PLT 233 04/16/2018    NCS (NERVE CONDUCTION STUDY) WITH EMG (ELECTROMYOGRAPHY) REPORT  STUDY DATE: 02/28/18 PATIENT NAME: [name] DOB: 05-16-1976 MRN: [id number]  ORDERING CLINICIAN: Merrilyn Puma, MD  TECHNOLOGIST: Oneita Jolly ELECTROMYOGRAPHER: Earlean Polka. 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  Now with new onset bilateral upper and lower extremity weakness and muscle twitching for last few months. Exam notable for proximal muscle weakness, rare fasciculations in right deltoid and right gastrocnemius, and normal sensation.  Muscle stretch reflexes are decreased in upper and lower extremities.   FINDINGS: NERVE CONDUCTION STUDY: Bilateral median, bilateral ulnar, bilateral peroneal and bilateral tibial motor responses are normal.  Bilateral radial, bilateral median, bilateral ulnar, bilateral sural and bilateral superficial peroneal sensory responses are normal.  Bilateral medial antebrachial cutaneous nerves of the forearm are normal.  Right lateral antebrachial cutaneous nerve forearm is normal.  Left antebrachial cutaneous nerve of forearm has normal peak latency and decreased amplitude.  Left spinal accessory motor response has slightly prolonged distal latency and decreased amplitude.   NEEDLE ELECTROMYOGRAPHY:  Needle examination of bilateral upper and right lower extremities, cervical, thoracic and lumbar paraspinal muscles was  performed.  See table for further details.  No abnormal spontaneous activity noted in all muscle groups tested except for 1+ positive sharp waves in right biceps and rare fasciculations in right gastrocnemius muscles.  Motor unit recruitment was normal in all muscle groups tested except for early recruitment with small and polyphasic motor units in the following muscles: Left deltoid, biceps, left triceps, right deltoid, right biceps, right triceps, right vastus medialis, right gastrocnemius muscles.  Also left trapezius muscle notable for decreased recruitment of large motor units on exertion.  Bilateral cervical, right thoracic, right lumbar paraspinal muscles are normal.   MNC    Nerve / Sites Muscle Latency Ref. Amplitude Ref. Rel Amp Segments Distance Velocity Ref. Area    ms ms mV mV %  cm m/s m/s mVms  R Median - APB     Wrist APB 3.9 ?4.4 12.4 ?4.0 100 Wrist - APB 7   55.7     Upper arm APB 8.0  12.2  97.9 Upper arm - Wrist 22 53 ?49 54.0  L Median - APB     Wrist APB 3.8 ?4.4 4.4 ?4.0 100 Wrist - APB 7   22.4     Upper arm APB 8.0  4.8  109 Upper arm - Wrist 22 52 ?49 24.5  R Ulnar - ADM     Wrist ADM 3.3 ?3.3 12.5 ?6.0 100 Wrist - ADM 7   53.2     B.Elbow ADM 7.5  10.7  85.5 B.Elbow - Wrist 21 50 ?49 45.4     A.Elbow ADM 9.5  10.5  97.5 A.Elbow - B.Elbow 10 51 ?49 44.5         A.Elbow - Wrist      L Ulnar - ADM     Wrist ADM 3.3 ?  3.3 11.0 ?6.0 100 Wrist - ADM 7   37.2     B.Elbow ADM 7.1  10.1  91.6 B.Elbow - Wrist 21 55 ?49 34.8     A.Elbow ADM 8.9  9.4  92.7 A.Elbow - B.Elbow 10 56 ?49 32.3         A.Elbow - Wrist      R Peroneal - EDB     Ankle EDB 6.1 ?6.5 6.3 ?2.0 100 Ankle - EDB 9   23.5     Fib head EDB 13.2  7.1  112 Fib head - Ankle 32 46 ?44 28.3     Pop fossa EDB 15.9  6.7  94.4 Pop fossa - Fib head 12 44 ?44 28.5         Pop fossa - Ankle      L Peroneal - EDB     Ankle EDB 6.4 ?6.5 5.7 ?2.0  100 Ankle - EDB 9   23.4     Fib head EDB 14.0  5.8  101 Fib head - Ankle 33 44 ?44 24.5     Pop fossa EDB 16.7  6.3  109 Pop fossa - Fib head 12 44 ?44 26.8         Pop fossa - Ankle      R Tibial - AH     Ankle AH 5.7 ?5.8 16.0 ?4.0 100 Ankle - AH 9   51.4     Pop fossa AH 15.0  10.4  64.9 Pop fossa - Ankle 38 41 ?41 47.2  L Tibial - AH     Ankle AH 5.6 ?5.8 20.9 ?4.0 100 Ankle - AH 9   54.3     Pop fossa AH 14.8  13.3  63.7 Pop fossa - Ankle 38 41 ?41 44.1  L Accessory (spinal) - Trapezius     Neck Trapezius 3.9  4.1  100     32.2                        SNC    Nerve / Sites Rec. Site Peak Lat Ref.  Amp Ref. Segments Distance    ms ms V V  cm  R Radial - Anatomical snuff box (Forearm)     Forearm Wrist 2.4 ?2.9 22 ?15 Forearm - Wrist 10  L Radial - Anatomical snuff box (Forearm)     Forearm Wrist 2.5 ?2.9 26 ?15 Forearm - Wrist 10  R Sural - Ankle (Calf)     Calf Ankle 4.0 ?4.4 14 ?6 Calf - Ankle 14  L Sural - Ankle (Calf)     Calf Ankle 3.8 ?4.4 18 ?6 Calf - Ankle 14  R Superficial peroneal - Ankle     Lat leg Ankle 4.0 ?4.4 7 ?6 Lat leg - Ankle 14  L Superficial peroneal - Ankle     Lat leg Ankle 4.1 ?4.4 12 ?6 Lat leg - Ankle 14  R Median - Orthodromic (Dig II, Mid palm)     Dig II Wrist 3.2 ?3.4 20 ?10 Dig II - Wrist 13  L Median - Orthodromic (Dig II, Mid palm)     Dig II Wrist 3.1 ?3.4 19 ?10 Dig II - Wrist 13  R Ulnar - Orthodromic, (Dig V, Mid palm)     Dig V Wrist 3.1 ?3.1 18 ?5 Dig V - Wrist 11  L Ulnar - Orthodromic, (Dig V, Mid palm)     Dig V Wrist 2.9 ?  3.1 15 ?5 Dig V - Wrist 11  R Lateral antebrachial cutaneous - Forearm (Elbow)     Elbow Forearm 2.9 ?3.0 10 ?10 Elbow - Forearm 12  L Lateral antebrachial cutaneous - Forearm (Elbow)     Elbow Forearm 2.8 ?3.0 4 ?10 Elbow - Forearm 12  R Medial antebrachial cutaneous - Forearm (Elbow)     Elbow Forearm 2.5 ?3.2 5 ?5 Elbow - Forearm 12  L Medial antebrachial cutaneous -  Forearm (Elbow)     Elbow Forearm 2.4 ?3.2 9 ?5 Elbow - Forearm 12                                 F  Wave    Nerve F Lat Ref.   ms ms  R Tibial - AH 52.8 ?56.0  L Tibial - AH 52.8 ?56.0  R Ulnar - ADM 31.1 ?32.0  L Ulnar - ADM 30.1 ?32.0             EMG full                  EMG Summary Table    Spontaneous MUAP Recruitment  Muscle IA Fib PSW Fasc Other Amp Dur. Poly Pattern  L. Biceps brachii Normal None None None _______ Decreased Normal 1+ Early  L. Triceps brachii Normal None None None _______ Decreased Normal 1+ Early  L. Flexor carpi radialis Normal None None None _______ Normal Normal Normal Normal  L. First dorsal interosseous Normal None None None _______ Normal Normal Normal Normal  L. Deltoid Normal None None None _______ Normal Normal 1+ Early  L. Trapezius Normal None None None _______ Increased Normal Normal Reduced  R. Vastus medialis Normal None None None _______ Normal Normal 1+ Early  R. Tibialis anterior Normal None None None _______ Normal Normal Normal Normal  R. Gastrocnemius (Medial head) Normal None None Rare _______ Decreased Normal 1+ Early  R. Deltoid Normal None None None _______ Decreased Normal 1+ Early  R. Biceps brachii Normal None 1+ None _______ Decreased Normal 1+ Early  R. Triceps brachii Normal None None None _______ Decreased Normal 1+ Early  R. Flexor carpi radialis Normal None None None _______ Normal Normal Normal Normal  R. First dorsal interosseous Normal None None None _______ Normal Normal Normal Normal  L. Cervical paraspinals (mid) Normal None None None _______ Normal Normal Normal Normal  R. Cervical paraspinals (mid) Normal None None None _______ Normal Normal Normal Normal  R. Thoracic paraspinals (mid) Normal None None None _______ Normal Normal Normal Normal  R. Lumbar paraspinals (low) Normal None None None _______ Normal Normal Normal Normal     Pathology:    IMPRESSION:   Abnormal study demonstrating: 1.  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  Previously on electrodiagnostic study there was no motor response recorded, but now motor response is able to be recorded.  Previously spontaneous activity was noted on needle EMG, and now chronic denervation pattern is noted.  This may represent healing / recovery process.  2. Left antebrachial cutaneous nerve of forearm is slightly abnormal.  Remaining nerve conduction studies of left upper extremity are normal.  No other current electrodiagnostic evidence of left brachial plexopathy at this time, although left brachial plexopathy was diagnosed on MRI study in the past.  3. Bilateral upper and right lower extremity needle EMG study notable for myopathic recruitment pattern in proximal muscles.  Fasciculations were noted in right gastrocnemius muscle. These findings correlate with patient's new onset of generalized muscle weakness and muscle twitching.  Considerations would include steroid myopathy, inflammatory myopathy, muscle trauma, toxic or endocrine etiologies.  4. No electrodiagnostic evidence of underlying widespread polyneuropathy or polyradiculopathy.   INTERPRETING PHYSICIAN:  Penni Bombard, MD Certified in Neurology, Neurophysiology and Neuroimaging  [facility] [address], [place], [place] [address] [phone]   Assessment/Plan 1. Radiation-induced brachial plexopathy  2. Fasiculations/myopathy  Mr. Siever had an EMG which demonstrated a myopathic pattern, and now a muscle biopsy specimen which implicates a chronic neurogenic process.  Based on his clinical progression, this appears to be an active process although active denervation was not appreciated.  Based on relative paucity of sensory symptoms and high burden of fasciculations including tongue, there is concern for motor neuron disease at this time.  An extensive set of labs previously  ordered  were not obtained by the patient (CK, aldolase, cortisol, Vit-D, HIV, ESR, CRP, ANA, anti-Ro, anti-La, magnesium).  After extensive discussion with patient, preferred pathway at this time is referral to tertiary neuromuscular center for further consideration.  We appreciate the opportunity to participate in the care of KENTO GOSSMAN.  We will process the referral and continue to communicate and participate with plan of care.  All questions were answered. The patient knows to call the clinic with any problems, questions or concerns. No barriers to learning were detected.  The total time spent in the appointment was 40 minutes and more than 50% was on counseling and review of test results  Ventura Sellers, MD Medical Director of Neuro-Oncology [facility] [place] 04/17/18 1:59 PM

## 2018-04-17 NOTE — Telephone Encounter (Signed)
No Los 7/3

## 2018-04-22 ENCOUNTER — Encounter (HOSPITAL_COMMUNITY)
Admission: RE | Disposition: A | Discharge status: Home / Self Care | Payer: Self-pay | Source: Ambulatory Visit | Attending: General Surgery

## 2018-04-22 SURGERY — LAPAROSCOPIC CHOLECYSTECTOMY
Anesthesia: General

## 2018-04-23 NOTE — Progress Notes (Signed)
Called and spoke to patient (hipaa verified name/dob). Informed patient of updated surgery date/time (04/25/18 @1400 ).  Advised patient to arrive at 1200 on 04/25/18, he verbalized understanding.

## 2018-04-25 ENCOUNTER — Encounter: Payer: Self-pay | Admitting: Gastroenterology

## 2018-04-25 ENCOUNTER — Encounter (HOSPITAL_COMMUNITY)
Admission: RE | Disposition: A | Discharge status: Home / Self Care | Payer: Self-pay | Source: Ambulatory Visit | Attending: General Surgery

## 2018-04-25 ENCOUNTER — Ambulatory Visit (HOSPITAL_COMMUNITY)
Admission: RE | Admit: 2018-04-25 | Discharge: 2018-04-25 | Disposition: A | Discharge status: Home / Self Care | Payer: Managed Care, Other (non HMO) | Source: Ambulatory Visit | Attending: General Surgery | Admitting: General Surgery

## 2018-04-25 ENCOUNTER — Encounter (HOSPITAL_COMMUNITY): Payer: Self-pay

## 2018-04-25 ENCOUNTER — Other Ambulatory Visit: Payer: Self-pay

## 2018-04-25 ENCOUNTER — Ambulatory Visit (HOSPITAL_COMMUNITY): Payer: Managed Care, Other (non HMO) | Admitting: Anesthesiology

## 2018-04-25 DIAGNOSIS — K8010 Calculus of gallbladder with chronic cholecystitis without obstruction: Secondary | ICD-10-CM | POA: Diagnosis not present

## 2018-04-25 DIAGNOSIS — K429 Umbilical hernia without obstruction or gangrene: Secondary | ICD-10-CM | POA: Insufficient documentation

## 2018-04-25 HISTORY — PX: CHOLECYSTECTOMY: SHX55

## 2018-04-25 SURGERY — LAPAROSCOPIC CHOLECYSTECTOMY
Anesthesia: General | Site: Abdomen

## 2018-04-25 MED ORDER — ACETAMINOPHEN 650 MG RE SUPP: 650.0000 mg | RECTAL | Status: DC | PRN

## 2018-04-25 MED ORDER — DEXAMETHASONE SODIUM PHOSPHATE 10 MG/ML IJ SOLN
INTRAMUSCULAR | Status: DC | PRN
  Administered 2018-04-25: 8 mg via INTRAVENOUS

## 2018-04-25 MED ORDER — MIDAZOLAM HCL 2 MG/2ML IJ SOLN
INTRAMUSCULAR | Status: AC
  Filled 2018-04-25: qty 2

## 2018-04-25 MED ORDER — PROPOFOL 10 MG/ML IV BOLUS
INTRAVENOUS | Status: DC | PRN
  Administered 2018-04-25: 170 mg via INTRAVENOUS

## 2018-04-25 MED ORDER — CEFAZOLIN SODIUM-DEXTROSE 2-4 GM/100ML-% IV SOLN
INTRAVENOUS | Status: AC
  Filled 2018-04-25: qty 100

## 2018-04-25 MED ORDER — SODIUM CHLORIDE 0.9 % IV SOLN: INTRAVENOUS | Status: DC

## 2018-04-25 MED ORDER — SODIUM CHLORIDE 0.9 % IR SOLN
Status: DC | PRN
  Administered 2018-04-25: 1000 mL

## 2018-04-25 MED ORDER — KETOROLAC TROMETHAMINE 30 MG/ML IJ SOLN
INTRAMUSCULAR | Status: AC
  Filled 2018-04-25: qty 1

## 2018-04-25 MED ORDER — PROPOFOL 10 MG/ML IV BOLUS
INTRAVENOUS | Status: AC
  Filled 2018-04-25: qty 20

## 2018-04-25 MED ORDER — ROCURONIUM BROMIDE 100 MG/10ML IV SOLN
INTRAVENOUS | Status: DC | PRN
  Administered 2018-04-25: 50 mg via INTRAVENOUS
  Administered 2018-04-25: 20 mg via INTRAVENOUS

## 2018-04-25 MED ORDER — SUGAMMADEX SODIUM 200 MG/2ML IV SOLN
INTRAVENOUS | Status: DC | PRN
  Administered 2018-04-25: 200 mg via INTRAVENOUS

## 2018-04-25 MED ORDER — LIDOCAINE 2% (20 MG/ML) 5 ML SYRINGE
INTRAMUSCULAR | Status: AC
  Filled 2018-04-25: qty 5

## 2018-04-25 MED ORDER — BUPIVACAINE-EPINEPHRINE 0.25% -1:200000 IJ SOLN
INTRAMUSCULAR | Status: DC | PRN
  Administered 2018-04-25: 16 mL

## 2018-04-25 MED ORDER — OXYCODONE HCL 5 MG PO TABS: 5.0000 mg | ORAL_TABLET | Freq: Four times a day (QID) | ORAL | 0 refills | Status: DC | PRN

## 2018-04-25 MED ORDER — SODIUM CHLORIDE 0.9% FLUSH: 3.0000 mL | Freq: Two times a day (BID) | INTRAVENOUS | Status: DC

## 2018-04-25 MED ORDER — FENTANYL CITRATE (PF) 250 MCG/5ML IJ SOLN
INTRAMUSCULAR | Status: AC
  Filled 2018-04-25: qty 5

## 2018-04-25 MED ORDER — ACETAMINOPHEN 325 MG PO TABS: 650.0000 mg | ORAL_TABLET | ORAL | Status: DC | PRN

## 2018-04-25 MED ORDER — ONDANSETRON HCL 4 MG/2ML IJ SOLN
INTRAMUSCULAR | Status: DC | PRN
  Administered 2018-04-25: 4 mg via INTRAVENOUS

## 2018-04-25 MED ORDER — 0.9 % SODIUM CHLORIDE (POUR BTL) OPTIME
TOPICAL | Status: DC | PRN
  Administered 2018-04-25: 1000 mL

## 2018-04-25 MED ORDER — HYDROMORPHONE HCL 1 MG/ML IJ SOLN
INTRAMUSCULAR | Status: AC
  Administered 2018-04-25: 0.5 mg via INTRAVENOUS
  Filled 2018-04-25: qty 1

## 2018-04-25 MED ORDER — FENTANYL CITRATE (PF) 100 MCG/2ML IJ SOLN
INTRAMUSCULAR | Status: DC | PRN
  Administered 2018-04-25 (×3): 50 ug via INTRAVENOUS

## 2018-04-25 MED ORDER — ROCURONIUM BROMIDE 10 MG/ML (PF) SYRINGE
PREFILLED_SYRINGE | INTRAVENOUS | Status: AC
  Filled 2018-04-25: qty 10

## 2018-04-25 MED ORDER — DEXAMETHASONE SODIUM PHOSPHATE 10 MG/ML IJ SOLN
INTRAMUSCULAR | Status: AC
  Filled 2018-04-25: qty 1

## 2018-04-25 MED ORDER — LIDOCAINE HCL (CARDIAC) PF 100 MG/5ML IV SOSY
PREFILLED_SYRINGE | INTRAVENOUS | Status: DC | PRN
  Administered 2018-04-25: 80 mg via INTRAVENOUS

## 2018-04-25 MED ORDER — ONDANSETRON HCL 4 MG/2ML IJ SOLN
INTRAMUSCULAR | Status: AC
  Filled 2018-04-25: qty 2

## 2018-04-25 MED ORDER — KETOROLAC TROMETHAMINE 30 MG/ML IJ SOLN
INTRAMUSCULAR | Status: DC | PRN
  Administered 2018-04-25: 30 mg via INTRAVENOUS

## 2018-04-25 MED ORDER — OXYCODONE HCL 5 MG PO TABS: 5.0000 mg | ORAL_TABLET | ORAL | Status: DC | PRN

## 2018-04-25 MED ORDER — OXYCODONE HCL 5 MG PO TABS
5.0000 mg | ORAL_TABLET | Freq: Once | ORAL | Status: AC | PRN
  Administered 2018-04-25: 5 mg via ORAL

## 2018-04-25 MED ORDER — CEFAZOLIN SODIUM-DEXTROSE 2-3 GM-%(50ML) IV SOLR
INTRAVENOUS | Status: DC | PRN
  Administered 2018-04-25: 2 g via INTRAVENOUS

## 2018-04-25 MED ORDER — HYDROMORPHONE HCL 1 MG/ML IJ SOLN
0.2500 mg | INTRAMUSCULAR | Status: DC | PRN
  Administered 2018-04-25 (×4): 0.5 mg via INTRAVENOUS

## 2018-04-25 MED ORDER — SODIUM CHLORIDE 0.9% FLUSH: 3.0000 mL | INTRAVENOUS | Status: DC | PRN

## 2018-04-25 MED ORDER — LACTATED RINGERS IV SOLN
INTRAVENOUS | Status: DC
  Administered 2018-04-25: 13:00:00 via INTRAVENOUS

## 2018-04-25 MED ORDER — MORPHINE SULFATE (PF) 2 MG/ML IV SOLN: 2.0000 mg | INTRAVENOUS | Status: DC | PRN

## 2018-04-25 MED ORDER — MIDAZOLAM HCL 5 MG/5ML IJ SOLN
INTRAMUSCULAR | Status: DC | PRN
  Administered 2018-04-25: 2 mg via INTRAVENOUS

## 2018-04-25 MED ORDER — OXYCODONE HCL 5 MG PO TABS
ORAL_TABLET | ORAL | Status: AC
  Filled 2018-04-25: qty 1

## 2018-04-25 MED ORDER — OXYCODONE HCL 5 MG/5ML PO SOLN: 5.0000 mg | Freq: Once | ORAL | Status: AC | PRN

## 2018-04-25 MED ORDER — SODIUM CHLORIDE 0.9 % IV SOLN: 250.0000 mL | INTRAVENOUS | Status: DC | PRN

## 2018-04-25 MED ORDER — BUPIVACAINE-EPINEPHRINE (PF) 0.25% -1:200000 IJ SOLN
INTRAMUSCULAR | Status: AC
  Filled 2018-04-25: qty 30

## 2018-04-25 SURGICAL SUPPLY — 38 items
APPLIER CLIP 5 13 M/L LIGAMAX5 (MISCELLANEOUS) ×3
BLADE CLIPPER SURG (BLADE) ×3 IMPLANT
CANISTER SUCT 3000ML PPV (MISCELLANEOUS) ×3 IMPLANT
CHLORAPREP W/TINT 26ML (MISCELLANEOUS) ×3 IMPLANT
CLIP APPLIE 5 13 M/L LIGAMAX5 (MISCELLANEOUS) ×1 IMPLANT
CLOSURE WOUND 1/2 X4 (GAUZE/BANDAGES/DRESSINGS) ×1
COVER SURGICAL LIGHT HANDLE (MISCELLANEOUS) ×3 IMPLANT
DERMABOND ADVANCED (GAUZE/BANDAGES/DRESSINGS) ×2
DERMABOND ADVANCED .7 DNX12 (GAUZE/BANDAGES/DRESSINGS) ×1 IMPLANT
ELECT REM PT RETURN 9FT ADLT (ELECTROSURGICAL) ×3
ELECTRODE REM PT RTRN 9FT ADLT (ELECTROSURGICAL) ×1 IMPLANT
GLOVE BIO SURGEON STRL SZ7 (GLOVE) ×3 IMPLANT
GLOVE BIO SURGEON STRL SZ7.5 (GLOVE) ×3 IMPLANT
GLOVE BIOGEL PI IND STRL 7.5 (GLOVE) ×2 IMPLANT
GLOVE BIOGEL PI INDICATOR 7.5 (GLOVE) ×4
GOWN STRL REUS W/ TWL LRG LVL3 (GOWN DISPOSABLE) ×3 IMPLANT
GOWN STRL REUS W/TWL LRG LVL3 (GOWN DISPOSABLE) ×6
GRASPER SUT TROCAR 14GX15 (MISCELLANEOUS) ×3 IMPLANT
KIT BASIN OR (CUSTOM PROCEDURE TRAY) ×3 IMPLANT
KIT TURNOVER KIT B (KITS) ×3 IMPLANT
NS IRRIG 1000ML POUR BTL (IV SOLUTION) ×3 IMPLANT
PAD ARMBOARD 7.5X6 YLW CONV (MISCELLANEOUS) ×3 IMPLANT
POUCH RETRIEVAL ECOSAC 10 (ENDOMECHANICALS) ×1 IMPLANT
POUCH RETRIEVAL ECOSAC 10MM (ENDOMECHANICALS) ×2
SCISSORS LAP 5X35 DISP (ENDOMECHANICALS) ×3 IMPLANT
SET IRRIG TUBING LAPAROSCOPIC (IRRIGATION / IRRIGATOR) ×3 IMPLANT
SLEEVE ENDOPATH XCEL 5M (ENDOMECHANICALS) ×6 IMPLANT
SPECIMEN JAR SMALL (MISCELLANEOUS) ×3 IMPLANT
STRIP CLOSURE SKIN 1/2X4 (GAUZE/BANDAGES/DRESSINGS) ×2 IMPLANT
SUT MNCRL AB 4-0 PS2 18 (SUTURE) ×3 IMPLANT
SUT VICRYL 0 UR6 27IN ABS (SUTURE) ×3 IMPLANT
TOWEL OR 17X24 6PK STRL BLUE (TOWEL DISPOSABLE) ×3 IMPLANT
TOWEL OR 17X26 10 PK STRL BLUE (TOWEL DISPOSABLE) ×3 IMPLANT
TRAY LAPAROSCOPIC MC (CUSTOM PROCEDURE TRAY) ×3 IMPLANT
TROCAR XCEL BLUNT TIP 100MML (ENDOMECHANICALS) ×3 IMPLANT
TROCAR XCEL NON-BLD 5MMX100MML (ENDOMECHANICALS) ×3 IMPLANT
TUBING INSUFFLATION (TUBING) ×3 IMPLANT
WATER STERILE IRR 1000ML POUR (IV SOLUTION) ×3 IMPLANT

## 2018-04-25 NOTE — Transfer of Care (Signed)
Immediate Anesthesia Transfer of Care Note  Patient: [name]  Procedure(s) Performed: LAPAROSCOPIC CHOLECYSTECTOMY (N/A Abdomen)  Patient Location: PACU  Anesthesia Type:General  Level of Consciousness: awake, alert  and oriented  Airway & Oxygen Therapy: Patient Spontanous Breathing and Patient connected to nasal cannula oxygen  Post-op Assessment: Report given to RN and Post -op Vital signs reviewed and stable  Post vital signs: Reviewed and stable  Last Vitals:  Vitals Value Taken Time  BP 149/85 04/25/2018  3:22 PM  Temp    Pulse 86 04/25/2018  3:22 PM  Resp 21 04/25/2018  3:22 PM  SpO2 99 % 04/25/2018  3:22 PM  Vitals shown include unvalidated device data.  Last Pain:  Vitals:   04/25/18 1221  TempSrc: Oral         Complications: No apparent anesthesia complications

## 2018-04-25 NOTE — Interval H&P Note (Signed)
History and Physical Interval Note:  04/25/2018 12:37 PM  [name]  has presented today for surgery, with the diagnosis of gallstones  The various methods of treatment have been discussed with the patient and family. After consideration of risks, benefits and other options for treatment, the patient has consented to  Procedure(s): LAPAROSCOPIC CHOLECYSTECTOMY (N/A) as a surgical intervention .  The patient's history has been reviewed, patient examined, no change in status, stable for surgery.  I have reviewed the patient's chart and labs.  Questions were answered to the patient's satisfaction.     Rolm Bookbinder

## 2018-04-25 NOTE — Discharge Instructions (Signed)
[facility] LAPAROSCOPIC SURGERY: POST OP INSTRUCTIONS  Always review your discharge instruction sheet given to you by the facility where your surgery was performed. IF YOU HAVE DISABILITY OR FAMILY LEAVE FORMS, YOU MUST BRING THEM TO THE OFFICE FOR PROCESSING.   DO NOT GIVE THEM TO YOUR DOCTOR.  1. A prescription for pain medication may be given to you upon discharge.  Take your pain medication as prescribed, if needed.  If narcotic pain medicine is not needed, then you may take acetaminophen (Tylenol), naprosyn (Alleve), or ibuprofen (Advil) as needed. 2. Take your usually prescribed medications unless otherwise directed. 3. If you need a refill on your pain medication, please contact your pharmacy.  They will contact our office to request authorization. Prescriptions will not be filled after 5pm or on week-ends. 4. You should follow a light diet the first few days after arrival home, such as soup and crackers, etc.  Be sure to include lots of fluids daily. 5. Most patients will experience some swelling and bruising in the area of the incisions.  Ice packs will help.  Swelling and bruising can take several days to resolve.  6. It is common to experience some constipation if taking pain medication after surgery.  Increasing fluid intake and taking a stool softener (such as Colace) will usually help or prevent this problem from occurring.  A mild laxative (Milk of Magnesia or Miralax) should be taken according to package instructions if there are no bowel movements after 48 hours. 7. Unless discharge instructions indicate otherwise, you may remove your bandages 48 hours after surgery, and you may shower at that time.  You may have steri-strips (small skin tapes) in place directly over the incision.  These strips should be left on the skin for 7-10 days.  If your surgeon used skin glue on the incision, you may shower in 24 hours.  The glue will flake  off over the next 2-3 weeks.  Any sutures or staples will be removed at the office during your follow-up visit. 8. ACTIVITIES:  You may resume regular (light) daily activities beginning the next day--such as daily self-care, walking, climbing stairs--gradually increasing activities as tolerated.  You may have sexual intercourse when it is comfortable.  Refrain from any heavy lifting or straining until approved by your doctor. a. You may drive when you are no longer taking prescription pain medication, you can comfortably wear a seatbelt, and you can safely maneuver your car and apply brakes. b. RETURN TO WORK:  __________________________________________________________ 9. You should see your doctor in the office for a follow-up appointment approximately 2-3 weeks after your surgery.  Make sure that you call for this appointment within a day or two after you arrive home to insure a convenient appointment time. 10. OTHER INSTRUCTIONS: __________________________________________________________________________________________________________________________ __________________________________________________________________________________________________________________________ WHEN TO CALL YOUR DOCTOR: 1. Fever over 101.0 2. Inability to urinate 3. Continued bleeding from incision. 4. Increased pain, redness, or drainage from the incision. 5. Increasing abdominal pain  The clinic staff is available to answer your questions during regular business hours.  Please don't hesitate to call and ask to speak to one of the nurses for clinical concerns.  If you have a medical emergency, go to the nearest emergency room or call 911.  A surgeon from [facility] is always on call at the hospital. [address], [address], [place], [place]  [address] ? [address] [address], [place], [place]   [address] [phone] ? [phone] ? [phone] Web site: [email or web address]  

## 2018-04-25 NOTE — H&P (Signed)
33 yom referred by Dr Isidore Moos for gallstones. 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 he has a scrotal mass that appears benign and underwent evaluation with ct due to cancer history and was noted to have gallstones present. 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 he does complain of ruq pain that occurs every other day, no real relation to eating but is not eating normally due to bloating and has swallowing issues as well. the pain does radiated around his right side. he has back pain higher up and lower that I dont think related to gb. he has no n/v.    Past Surgical History (Tanisha A. Owens Shark, [place]; 04/08/2018 3:30 PM) Oral Surgery  Tonsillectomy   Diagnostic Studies History (Tanisha A. Owens Shark, [place]; 04/08/2018 3:30 PM) Colonoscopy  never  Allergies (Tanisha A. Owens Shark, [place]; 04/08/2018 3:31 PM) No Known Drug Allergies [04/08/2018]: Allergies Reconciled   Medication History (Tanisha A. Owens Shark, [place]; 04/08/2018 3:32 PM) Tylenol (325MG  Tablet, Oral) Active. Gabapentin (300MG  Tablet, Oral) Active. Ritalin (5MG  Tablet, Oral) Active. TRENtal (400MG  Tablet ER, Oral) Active. Zantac (150MG  Tablet, Oral) Active. Tramadol-Acetaminophen (37.5-325MG  Tablet, Oral) Active. Vitamin E (400UNIT Capsule, Oral) Active. Medications Reconciled  Social History (Tanisha A. Owens Shark, [place]; 04/08/2018 3:30 PM) Alcohol use  Occasional alcohol use. Caffeine use  Coffee. No drug use  Tobacco use  Never smoker.  Family History (Tanisha A. Owens Shark, [place]; 04/08/2018 3:30 PM) Family history unknown  First Degree Relatives   Other Problems (Tanisha A. Owens Shark, [place]; 04/08/2018 3:30 PM) Cancer  Cholelithiasis  Heart murmur  Other disease, cancer, significant illness     Review of Systems (Tanisha A. Brown RMA; 04/08/2018 3:30  PM) General Present- Appetite Loss, Fatigue and Weight Loss. Not Present- Chills, Fever, Night Sweats and Weight Gain. HEENT Present- Visual Disturbances. Not Present- Earache, Hearing Loss, Hoarseness, Nose Bleed, Oral Ulcers, Ringing in the Ears, Seasonal Allergies, Sinus Pain, Sore Throat, Wears glasses/contact lenses and Yellow Eyes. Cardiovascular Present- Leg Cramps and Palpitations. Not Present- Chest Pain, Difficulty Breathing Lying Down, Rapid Heart Rate, Shortness of Breath and Swelling of Extremities. Gastrointestinal Present- Abdominal Pain, Constipation and Gets full quickly at meals. Not Present- Bloating, Bloody Stool, Change in Bowel Habits, Chronic diarrhea, Difficulty Swallowing, Excessive gas, Hemorrhoids, Indigestion, Nausea, Rectal Pain and Vomiting. Musculoskeletal Present- Back Pain, Muscle Pain and Muscle Weakness. Not Present- Joint Pain, Joint Stiffness and Swelling of Extremities. Neurological Present- Decreased Memory, Headaches, Numbness, Seizures, Tingling, Tremor, Trouble walking and Weakness. Not Present- Fainting. Psychiatric Present- Change in Sleep Pattern and Depression. Not Present- Anxiety, Bipolar, Fearful and Frequent crying.  Vitals (Tanisha A. Brown RMA; 04/08/2018 3:30 PM) 04/08/2018 3:30 PM Weight: 148 lb Height: 71in Body Surface Area: 1.86 m Body Mass Index: 20.64 kg/m  Temp.: 98.52F  Pulse: 94 (Regular)  BP: 118/84 (Sitting, Left Arm, Standard)       Physical Exam Rolm Bookbinder MD; 04/08/2018 5:18 PM) General Mental Status-Alert.  Head and Neck Trachea-midline. Note: healed left neck incision    Eye Sclera/Conjunctiva - Bilateral-No scleral icterus.  Chest and Lung Exam Chest and lung exam reveals -quiet, even and easy respiratory effort with no use of accessory muscles and on auscultation, normal breath sounds, no adventitious sounds and normal vocal resonance.  Cardiovascular Cardiovascular examination  reveals -normal heart sounds, regular rate and rhythm with no murmurs.  Abdomen Note: soft  tender ruq small uh   Neurologic Neurologic evaluation reveals -alert and oriented x 3 with no impairment of recent or remote memory.    Assessment & Plan Rolm Bookbinder MD; 04/08/2018 5:18 PM) GALLSTONES (K80.20) Story: Laparoscopic cholecystectomy I do think some of his symptoms are related to his gallbladder. I think cholecystectomy due to symptoms and possible other complications is indicated. I discussed the procedure in detail. We discussed the risks and benefits of a laparoscopic cholecystectomy and possible cholangiogram including, but not limited to bleeding, infection, injury to surrounding structures such as the intestine or liver, bile leak, retained gallstones, need to convert to an open procedure, prolonged diarrhea, blood clots such as DVT, common bile duct injury, anesthesia risks, and possible need for additional procedures. The likelihood of improvement in symptoms and return to the patient's normal status is good. We discussed the typical post-operative recovery course.

## 2018-04-25 NOTE — Op Note (Signed)
Preoperative diagnosis:symptomatic cholelithiasis Postoperative diagnosis:saa Procedure: Laparoscopiccholecystectomy, primary umbilical hernia repair Surgeon: Dr. Serita Grammes Anesthesia: Gen. Specimens: gb to pathology Estimated blood SUOR:56FB Complications: None Drains: none Sponge count was correct at completion Disposition to recovery stable  Indications: This is a77 yom with biliary colic. We discussed proceeding with laparoscopic cholecystectomy.    Procedure: After informed consent was obtained the patient was taken to the operating room.Hewas given antibiotics. SCDs were in place.He was placed undergeneral anesthesia without complication. Hisabdomen was prepped and draped in the standard sterile surgical fashion. A surgical timeout was then performed.  I infiltrated marcainebelowthe umbilicus.I then made a curvilinear incision.I lifted up the umbilical stalk.  I enlarged the hernia which was only about 5 mm.  I then entered the abdomen.I inserted a hasson trocar and insufflated the abdomen to 15 mm Hg pressure.An additionalruq, right mid abdomenand epigastric trocar were both placed under direct vision (5 mm). I grasped the gallbladder and retracted it cephalad and lateral. I was able to identify the critical view of safety.  I was able to identify the cystic artery clearly. I clipped and divided the cystic artery to get this out of the way. I was able to clip the cystic duct leaving two clips in place. The duct was viable and the clips traversed the duct.I placed the gallbladderin a bag and removed it from the umbilicus.  I obtained hemostasis. I removed the hasson trocar. I then removed the remaining trocars and these were closed with 4-0 Monocryl and glue. I closed the umbilical hernia with multiple #1 novafil sutures.  I then tacked the umbilicus down with 3-0 vicryl and closed the dermis with 3-0 vicryl.  I closed this with 4-0 monocryl and glue  also.He tolerated this well be transferred to the recovery room

## 2018-04-25 NOTE — Anesthesia Procedure Notes (Signed)
Procedure Name: Intubation Date/Time: 04/25/2018 2:15 PM Performed by: Irven Baltimore, RN Pre-anesthesia Checklist: Patient identified, Emergency Drugs available, Suction available, Patient being monitored and Timeout performed Patient Re-evaluated:Patient Re-evaluated prior to induction Oxygen Delivery Method: Circle system utilized Preoxygenation: Pre-oxygenation with 100% oxygen Induction Type: IV induction Ventilation: Mask ventilation without difficulty Laryngoscope Size: Miller and 2 Grade View: Grade I Tube type: Oral Tube size: 7.5 mm Number of attempts: 1 Airway Equipment and Method: Stylet Placement Confirmation: ETT inserted through vocal cords under direct vision,  positive ETCO2 and breath sounds checked- equal and bilateral Secured at: 22 cm Tube secured with: Tape Dental Injury: Teeth and Oropharynx as per pre-operative assessment

## 2018-04-25 NOTE — Anesthesia Preprocedure Evaluation (Addendum)
Anesthesia Evaluation  Patient identified by MRN, date of birth, ID band Patient awake  General Assessment Comment:Hx of tonsillar ca, L neck dissection and radiation  Reviewed: Allergy & Precautions, NPO status , Patient's Chart, lab work & pertinent test results  History of Anesthesia Complications Negative for: history of anesthetic complications  Airway Mallampati: I  TM Distance: >3 FB Neck ROM: Full    Dental  (+) Teeth Intact   Pulmonary    breath sounds clear to auscultation       Cardiovascular + Valvular Problems/Murmurs MVP  Rhythm:Regular Rate:Normal     Neuro/Psych General weakness  Neuromuscular disease    GI/Hepatic Neg liver ROS, GERD  ,  Endo/Other  negative endocrine ROS  Renal/GU negative Renal ROS     Musculoskeletal   Abdominal   Peds  Hematology negative hematology ROS (+)   Anesthesia Other Findings   Reproductive/Obstetrics                           Anesthesia Physical Anesthesia Plan  ASA: III  Anesthesia Plan: General   Post-op Pain Management:    Induction: Intravenous  PONV Risk Score and Plan: 3 and Treatment may vary due to age or medical condition and Dexamethasone  Airway Management Planned: Oral ETT  Additional Equipment:   Intra-op Plan:   Post-operative Plan: Extubation in OR  Informed Consent: I have reviewed the patients History and Physical, chart, labs and discussed the procedure including the risks, benefits and alternatives for the proposed anesthesia with the patient or authorized representative who has indicated his/her understanding and acceptance.     Plan Discussed with:   Anesthesia Plan Comments:         Anesthesia Quick Evaluation

## 2018-04-26 ENCOUNTER — Encounter (HOSPITAL_COMMUNITY): Payer: Self-pay | Admitting: General Surgery

## 2018-04-26 NOTE — Anesthesia Postprocedure Evaluation (Signed)
Anesthesia Post Note  Patient: [name]  Procedure(s) Performed: LAPAROSCOPIC CHOLECYSTECTOMY (N/A Abdomen)     Patient location during evaluation: PACU Anesthesia Type: General Level of consciousness: awake and alert Pain management: pain level controlled Vital Signs Assessment: post-procedure vital signs reviewed and stable Respiratory status: spontaneous breathing, nonlabored ventilation, respiratory function stable and patient connected to nasal cannula oxygen Cardiovascular status: blood pressure returned to baseline and stable Postop Assessment: no apparent nausea or vomiting Anesthetic complications: no    Last Vitals:  Vitals:   04/25/18 1615 04/25/18 1630  BP: (!) 147/81 (!) 152/80  Pulse: 86 87  Resp: 18 17  Temp:  36.4 C  SpO2: 99% 99%    Last Pain:  Vitals:   04/25/18 1532  TempSrc:   PainSc: 7                  Monda Chastain,JAMES TERRILL

## 2018-04-29 ENCOUNTER — Other Ambulatory Visit: Payer: Self-pay | Admitting: *Deleted

## 2018-04-29 ENCOUNTER — Other Ambulatory Visit: Payer: Self-pay | Admitting: Internal Medicine

## 2018-04-29 DIAGNOSIS — G540 Brachial plexus disorders: Secondary | ICD-10-CM

## 2018-04-29 DIAGNOSIS — W888XXA Exposure to other ionizing radiation, initial encounter: Principal | ICD-10-CM

## 2018-04-29 MED ORDER — METHYLPHENIDATE HCL 5 MG PO TABS: 5.0000 mg | ORAL_TABLET | Freq: Two times a day (BID) | ORAL | 0 refills | Status: DC

## 2018-05-02 ENCOUNTER — Telehealth: Payer: Self-pay | Admitting: *Deleted

## 2018-05-02 NOTE — Telephone Encounter (Signed)
Patient states he was redirected from getting an appointment with Dr Venita Sheffield instead to Dr Franchot Mimes who is the director of ALS at [facility].  He is pending his case being reviewed and then if approved he will be able to get an appointment with him.  Anticipated to get call back within 1 week and he will advise [place] if he was able to secure an appt.

## 2018-05-06 ENCOUNTER — Telehealth: Payer: Self-pay | Admitting: *Deleted

## 2018-05-06 NOTE — Telephone Encounter (Signed)
Oncology Nurse Navigator Documentation  Returned Mr. [name]'s call from this morning.  He indicated he has early August appt with Social Security to apply for benefits, was directed to provide documentation re medical history for cancer treatment including diagnosis, treatment, post-tmt activities.  I indicated I would begin compiling information, review with him by phone before mailing.  He voiced agreement with plan.  [name], RN, BSN Head & Neck Oncology Nurse [place] at [place] [phone]

## 2018-05-16 ENCOUNTER — Telehealth: Payer: Self-pay | Admitting: *Deleted

## 2018-05-16 NOTE — Progress Notes (Signed)
  [name] presents for follow up of radiation completed 07/05/16 to his Tonsils and bilateral neck  Pain issues, if any: He reports continued pain radiating to his arms. He has severe pain to his left neck and left ear. The pain lasts 30 seconds but is severe. He reports that the pain causes paralysis. He continues to have numbness to his hands.  Using a feeding tube?: N/A Weight changes, if any:  Wt Readings from Last 3 Encounters:  05/29/18 151 lb 9.6 oz (68.8 kg)  04/25/18 148 lb (67.1 kg)  04/17/18 148 lb 4.8 oz (67.3 kg)   Swallowing issues, if any: Food will get stuck in his throat at time. He does need to take pills with food to help him swallow. His mouth will clamp down while eating at time. He often bites his tongue.  Smoking or chewing tobacco? No Using fluoride trays daily? Yes Last ENT visit was on: 10/18 Dr. Conley Canal, no follow up.  Other notable issues, if any:  04/17/18 Dr. Mickeal Skinner  05/15/18 Dr. Venita Sheffield [facility]) Had MRI scheduled. ? Motor neuron disease.   03/27/18 right bicep biopsy, Dr. Christella Noa 04/25/18 Lap Cholecystectomy.   BP (!) 130/92   Pulse 91   Temp 97.9 F (36.6 C) (Oral)   Resp 18   Ht 5\' 10"  (1.778 m)   Wt 151 lb 9.6 oz (68.8 kg)   SpO2 100%   BMI 21.75 kg/m

## 2018-05-16 NOTE — Telephone Encounter (Signed)
Oncology Nurse Navigator Documentation  Called [name], informed compilation of treatment documentation completed, provided overview.  He indicated he will come by [facility] tomorrow around noon to pick up.  He voiced understanding to have me paged upon his arrival.  [name], RN, BSN Head & Neck Oncology Nurse [place] at [place] [phone]

## 2018-05-17 ENCOUNTER — Encounter: Payer: Self-pay | Admitting: *Deleted

## 2018-05-17 NOTE — Progress Notes (Signed)
Oncology Nurse Navigator Documentation  Met [name] in lobby.  Reviewed information compiled re his care at [facility]. I encouraged him to contact me s/p next Monday's appt with Social Security if additional information is needed in support of disability application.  He voiced understanding. Confirmed his understanding of 8/14 11:40 follow-up appt with Dr. Isidore Moos.  [name], RN, BSN Head & Neck Oncology Nurse [place] at [place] [phone]

## 2018-05-29 ENCOUNTER — Other Ambulatory Visit: Payer: Self-pay

## 2018-05-29 ENCOUNTER — Encounter: Payer: Self-pay | Admitting: *Deleted

## 2018-05-29 ENCOUNTER — Ambulatory Visit
Admission: RE | Admit: 2018-05-29 | Discharge: 2018-05-29 | Disposition: A | Discharge status: Home / Self Care | Payer: Managed Care, Other (non HMO) | Source: Ambulatory Visit | Attending: Radiation Oncology | Admitting: Radiation Oncology

## 2018-05-29 VITALS — BP 130/92 | HR 91 | Temp 97.9°F | Resp 18 | Ht 70.0 in | Wt 151.6 lb

## 2018-05-29 DIAGNOSIS — C090 Malignant neoplasm of tonsillar fossa: Secondary | ICD-10-CM | POA: Diagnosis not present

## 2018-05-29 DIAGNOSIS — Z79899 Other long term (current) drug therapy: Secondary | ICD-10-CM | POA: Diagnosis not present

## 2018-05-29 NOTE — Progress Notes (Signed)
Radiation Oncology         [phone] ________________________________  Name: [name] MRN: [id number]  Date: 05/29/2018  DOB: 1976-09-12  Follow-Up Visit  Note  CC: Alroy Dust, L.Marlou Sa, MD  Jerrell Belfast, MD  Diagnosis and Prior Radiotherapy:     ICD-10-CM   1. 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  Narrative:  The patient returns today for routine follow-up and is struggling physically. On 03/07/18, he underwent a chest and a pelvis CT scan. Both were negative for malignancy. The patient was referred to [facility] for a second opinion to work down possible causes of motor-neuron disease. ALS is on the differential but multiple tests must be done to try to explain his debilitating symptoms.  The muscle biopsy performed by Dr. Christella Noa did not provide answers any for his neurological issues. He underwent laparoscopic cholecystectomy in July by Dr. Donne Hazel. Pathology showed no evidence of malignancy, but did show chronic cholecystitis cholelithiasis.   He reports a fall with back injury in May, and he has been out of work. He has been struggling heavily with insurance, work, and finances.   He reports decreased dexterity, drops things. He reports continued pain radiating to his arms. He notes a spontaneous, severe pain to his left neck and left ear. The pain lasts 30 seconds but is severe, and he reports that the pain causes complete paralysis. He notes the onset may be related to his neck positioning. He continues to have numbness to  his hands, decreased strength of his left arm, dragging of his left foot. He notes calf spasms, which I witnessed, and face spasms. Spasms can been in various parts of the body.   weight changes, if any:  Wt Readings from Last 3 Encounters:  05/29/18 151 lb 9.6 oz (68.8 kg)  04/25/18 148 lb (67.1 kg)  04/17/18 148 lb 4.8 oz (67.3 kg)   Last ENT visit was on: 10/02/17 with Dr. Conley Canal  ALLERGIES:  has No Known Allergies.  Meds: Current Outpatient Medications  Medication Sig Dispense Refill  . acetaminophen (TYLENOL) 325 MG tablet Take 650 mg by mouth every 6 (six) hours as needed for moderate pain or headache.     . citalopram (CELEXA) 20 MG tablet Take by mouth.    . gabapentin (NEURONTIN) 100 MG capsule Take 300 mg by mouth 3 (three) times daily.     Marland Kitchen HYDROcodone-acetaminophen (NORCO/VICODIN) 5-325 MG tablet Take 1 tablet by mouth 2 (two) times daily as needed for moderate pain.    . methylphenidate (RITALIN) 5 MG tablet Take 1 tablet (5 mg total) by mouth 2 (two) times daily. 60 tablet 0  . ranitidine (ZANTAC) 150 MG capsule Take 150 mg by mouth daily as needed for heartburn.     . sodium fluoride (FLUORISHIELD) 1.1 % GEL dental gel Instill one drop of gel per tooth space of fluoride tray. Place over teeth for 5 minutes. Remove. Spit out excess. Repeat nightly. 120 mL prn  . vitamin E 400 UNIT capsule Take 400 IU daily x 1 week, then 400 IU BID (  Patient taking differently: Take 400 Units by mouth 2 (two) times daily. ) 60 capsule 5   No current facility-administered medications for this encounter.     Physical Findings: Wt Readings from Last 3 Encounters:  05/29/18 151 lb 9.6 oz (68.8 kg)  04/25/18 148 lb (67.1 kg)  04/17/18 148 lb 4.8 oz (67.3 kg)    height is 5\' 10"  (1.778 m) and weight is 151 lb 9.6 oz (68.8 kg). His oral temperature is 97.9 F (36.6 C). His blood pressure is 130/92 (abnormal) and his pulse is 91. His respiration is 18 and oxygen saturation is 100%.    General: Alert and oriented, in no acute distress. HEENT: Extraocular movements are intact. Oropharynx is clear, no lesions in the oral cavity. Neck: Neck is supple, no palpable masses. Faint erythema over the left upper neck. Heart: Regular in rate and rhythm. He still has a murmur that is heard in the tricuspid/ mitral region.  Chest: Clear to auscultation bilaterally, with no rhonchi, wheezes, or rales. Extremities: No cyanosis or edema. Spasms in right calf. Psychiatric: Judgment and insight are intact. Affect is appropriate.   Lab Findings: Lab Results  Component Value Date   WBC 4.6 04/16/2018   HGB 14.4 04/16/2018   HCT 43.4 04/16/2018   MCV 91.0 04/16/2018   PLT 233 04/16/2018    Lab Results  Component Value Date   TSH 2.631 12/21/2017    Radiographic Findings:   No results found.  Impression/Plan: Stage III Left Tonsillar Cancer  1. Head and Neck Cancer Status: No evidence of malignant disease but he is struggling physically and has limited social support and numerous financial concerns.  He is out of work and I cannot imagine how he would be able to carry through with work duties given his debilitating symptoms.  I do not think it is realistic that he would be able to work in at least the next 12 months given that the neuromuscular symptoms have been present and in certain ways progressively worsening over the past year without any effective treatments. He is now having sustained episodes of paralysis combined with severe pain, spontaneously.  2. Social Work: I spoke with her Education officer, museum and she will be in touch with him.  I think he should qualify for permanent disability.  He needs help navigating the various systems that he is trying to understand as he copes with his medical issues and financial issues and lacks much social support.  In particular, he needs help understanding the [place] system and obtaining benefits that he is eligible for  3. Pain/Neurologic issues: He  has an appointment with [facility] on 08/07/18.  I spoke with Dr. Warnell Forester, his neurologist there about the patient's numerous struggles.  She stated that he can meet with one of their financial counselors before his appointment next week for EMG testing.  She understands the urgency that he feels to arrive at a diagnosis  4. Follow up in 3 months with me. I will tentatively schedule a CT scan neck and chest for surveillance of his high risk cancer, which he is able to cancel if needed. I reiterated to him that I will continue to support him and advocate for him.  He is going through such a tough and scary time. I appreciate every provider's help.  I spent 50 minutes face to face with the patient and more than 50% of that time was spent in counseling and/or coordination of care. _____________________________________   Eppie Gibson, MD  This document serves as a record of services personally performed by Eppie Gibson, MD. It was created on his behalf by Wilburn Mylar, a trained medical scribe. The creation of this record is based on the scribe's personal observations and the provider's statements to them. This document has been checked and approved by the attending provider.

## 2018-05-30 NOTE — Progress Notes (Signed)
Oncology Nurse Navigator Documentation  To provide further navigation support, met with [name] during follow-up appt with Dr. Isidore Moos, Radiation Oncology.  He completed RT to L tonsil/bilateral neck 07/05/16.  He reported termination of employment, denial of employment-related workman's comp, termination of insurance in 3 weeks, lack of income to pay [employer], challenges to recent visit to [facility] to apply for benefits, ongoing evaluations at [facility], opportunity to receive VA benefits if ALS diagnosed.  He voiced understanding Dr. Isidore Moos to reach out to [place], this navigator will reach out to [facility] to provide support. Intervention:  Spoke with [name] Shell, LCSW, re [name]'s situation, she indicated she discuss further with LCSW [name] re support opportunities. To RTC in 3 months to see Dr. Isidore Moos. I encouraged [name] to call me with needs/concerns.  [name], RN, BSN Head & Neck Oncology Nurse [place] [facility] [place] [phone]

## 2018-05-31 ENCOUNTER — Encounter: Payer: Self-pay | Admitting: Radiation Oncology

## 2018-05-31 ENCOUNTER — Encounter: Payer: Self-pay | Admitting: *Deleted

## 2018-05-31 ENCOUNTER — Other Ambulatory Visit: Payer: Self-pay | Admitting: Radiation Oncology

## 2018-05-31 DIAGNOSIS — C090 Malignant neoplasm of tonsillar fossa: Secondary | ICD-10-CM

## 2018-05-31 DIAGNOSIS — R5381 Other malaise: Secondary | ICD-10-CM

## 2018-05-31 NOTE — Progress Notes (Signed)
[place] Work  Clinical Social Work was referred by Merchandiser, retail for assessment of psychosocial needs.  Clinical Social Worker contacted patient by phone  to offer support and assess for needs.  CSW and patient discussed patient's current medical/legal/financial situation at length.  Patient discussed the many systems he's attempting to navigate including many physician specialists, his employer and worker's compensation claim, social security disability, and VA benefits. CSW validated patient's feelings of frustration and discussed possible next steps.  CSW will contact [place] Determinations office on patient's behalf to determine if there is any paperwork that may aid in expediting decision. CSW made referral to [place] to seek additional legal guidance. CSW will contact [place] Social Worker and request additional guidance.  CSW explored patient's emotions surrounding serious life changes as a result of undiagnosed symptoms.  Mr. Denham shared he experienced a month of "sitting on the couch, not showering" [inferring "giving up" or feeling depressed] but feels he has no other option but to move forward.  Patient's main concern at this time is receiving a diagnosis in order to qualify for financial/insurance benefits.      Gwinda Maine, LCSW  Clinical Social Worker [facility]

## 2018-06-06 ENCOUNTER — Encounter: Payer: Self-pay | Admitting: *Deleted

## 2018-06-06 NOTE — Progress Notes (Signed)
[place] Work  Clinical Social Work met with patient in [place] office for follow up.  CSW instructed by patient to send pertinent information to Disability Determinations office.  Patient's contact is Daphene Jaeger [phone] (fax [phone]).  CSW explained Disability Determinations will request all records.  CSW obtained release of information to share recent medical note.   CSW and patient further discussed anxiety surrounding the uncertainty of his diagnosis and how it impacts all aspects of his life.   Gwinda Maine, LCSW  Clinical Social Worker [facility]

## 2018-06-07 ENCOUNTER — Other Ambulatory Visit: Payer: Self-pay | Admitting: Gastroenterology

## 2018-06-10 DIAGNOSIS — Z0271 Encounter for disability determination: Secondary | ICD-10-CM

## 2018-06-21 ENCOUNTER — Other Ambulatory Visit: Payer: Self-pay | Admitting: Gastroenterology

## 2018-06-21 ENCOUNTER — Inpatient Hospital Stay: Admit: 2018-06-21 | Discharge: 2018-06-21 | Disposition: A | Discharge status: Home / Self Care | Payer: Self-pay

## 2018-06-21 ENCOUNTER — Inpatient Hospital Stay: Payer: Managed Care, Other (non HMO)

## 2018-06-21 ENCOUNTER — Emergency Department: Payer: Managed Care, Other (non HMO)

## 2018-06-21 ENCOUNTER — Other Ambulatory Visit: Payer: Self-pay

## 2018-06-21 ENCOUNTER — Inpatient Hospital Stay
Admission: EM | Admit: 2018-06-21 | Discharge: 2018-06-25 | DRG: 390 | Disposition: A | Discharge status: Home / Self Care | Payer: Managed Care, Other (non HMO) | Attending: Surgery | Admitting: Surgery

## 2018-06-21 DIAGNOSIS — D72829 Elevated white blood cell count, unspecified: Secondary | ICD-10-CM | POA: Diagnosis present

## 2018-06-21 DIAGNOSIS — Z8589 Personal history of malignant neoplasm of other organs and systems: Secondary | ICD-10-CM

## 2018-06-21 DIAGNOSIS — Z9049 Acquired absence of other specified parts of digestive tract: Secondary | ICD-10-CM

## 2018-06-21 DIAGNOSIS — Z806 Family history of leukemia: Secondary | ICD-10-CM

## 2018-06-21 DIAGNOSIS — K56609 Unspecified intestinal obstruction, unspecified as to partial versus complete obstruction: Secondary | ICD-10-CM | POA: Diagnosis present

## 2018-06-21 DIAGNOSIS — K5652 Intestinal adhesions [bands] with complete obstruction: Principal | ICD-10-CM | POA: Diagnosis present

## 2018-06-21 DIAGNOSIS — R17 Unspecified jaundice: Secondary | ICD-10-CM

## 2018-06-21 DIAGNOSIS — R3911 Hesitancy of micturition: Secondary | ICD-10-CM | POA: Diagnosis present

## 2018-06-21 DIAGNOSIS — Z801 Family history of malignant neoplasm of trachea, bronchus and lung: Secondary | ICD-10-CM

## 2018-06-21 DIAGNOSIS — I341 Nonrheumatic mitral (valve) prolapse: Secondary | ICD-10-CM | POA: Diagnosis present

## 2018-06-21 DIAGNOSIS — G629 Polyneuropathy, unspecified: Secondary | ICD-10-CM | POA: Diagnosis present

## 2018-06-21 DIAGNOSIS — Z87891 Personal history of nicotine dependence: Secondary | ICD-10-CM

## 2018-06-21 DIAGNOSIS — K219 Gastro-esophageal reflux disease without esophagitis: Secondary | ICD-10-CM | POA: Diagnosis present

## 2018-06-21 DIAGNOSIS — Z803 Family history of malignant neoplasm of breast: Secondary | ICD-10-CM

## 2018-06-21 DIAGNOSIS — Z923 Personal history of irradiation: Secondary | ICD-10-CM

## 2018-06-21 DIAGNOSIS — Z808 Family history of malignant neoplasm of other organs or systems: Secondary | ICD-10-CM

## 2018-06-21 DIAGNOSIS — R253 Fasciculation: Secondary | ICD-10-CM | POA: Diagnosis present

## 2018-06-21 DIAGNOSIS — Z0189 Encounter for other specified special examinations: Secondary | ICD-10-CM

## 2018-06-21 LAB — COMPREHENSIVE METABOLIC PANEL
ALT: 18 U/L (ref 0–44)
ANION GAP: 9 (ref 5–15)
AST: 19 U/L (ref 15–41)
Albumin: 4.8 g/dL (ref 3.5–5.0)
Alkaline Phosphatase: 40 U/L (ref 38–126)
BUN: 22 mg/dL — ABNORMAL HIGH (ref 6–20)
CHLORIDE: 96 mmol/L — AB (ref 98–111)
CO2: 32 mmol/L (ref 22–32)
Calcium: 10.2 mg/dL (ref 8.9–10.3)
Creatinine, Ser: 0.94 mg/dL (ref 0.61–1.24)
GFR calc non Af Amer: >60 mL/min (ref >60)
Glucose, Bld: 134 mg/dL — ABNORMAL HIGH (ref 70–99)
Potassium: 3.5 mmol/L (ref 3.5–5.1)
SODIUM: 137 mmol/L (ref 135–145)
Total Bilirubin: 2.9 mg/dL — ABNORMAL HIGH (ref 0.3–1.2)
Total Protein: 7.5 g/dL (ref 6.5–8.1)

## 2018-06-21 LAB — URINALYSIS, COMPLETE (UACMP) WITH MICROSCOPIC
Bacteria, UA: NONE SEEN
Bilirubin Urine: NEGATIVE
GLUCOSE, UA: NEGATIVE mg/dL
KETONES UR: NEGATIVE mg/dL
Leukocytes, UA: NEGATIVE
Nitrite: NEGATIVE
Protein, ur: NEGATIVE mg/dL
SQUAMOUS EPITHELIAL / LPF: NONE SEEN (ref 0–5)
Specific Gravity, Urine: >1.046 — ABNORMAL HIGH (ref 1.005–1.030)
pH: 6 (ref 5.0–8.0)

## 2018-06-21 LAB — LIPASE, BLOOD: LIPASE: 23 U/L (ref 11–51)

## 2018-06-21 LAB — CBC
HCT: 41.8 % (ref 40.0–52.0)
Hemoglobin: 14.6 g/dL (ref 13.0–18.0)
MCH: 30.8 pg (ref 26.0–34.0)
MCHC: 34.9 g/dL (ref 32.0–36.0)
MCV: 88.2 fL (ref 80.0–100.0)
Platelets: 263 10*3/uL (ref 150–440)
RBC: 4.73 MIL/uL (ref 4.40–5.90)
RDW: 12.9 % (ref 11.5–14.5)
WBC: 14.7 10*3/uL — ABNORMAL HIGH (ref 3.8–10.6)

## 2018-06-21 LAB — TROPONIN I

## 2018-06-21 LAB — LACTIC ACID, PLASMA: LACTIC ACID, VENOUS: 1.2 mmol/L (ref 0.5–1.9)

## 2018-06-21 MED ORDER — GABAPENTIN 300 MG PO CAPS
300.0000 mg | ORAL_CAPSULE | Freq: Four times a day (QID) | ORAL | Status: DC
  Filled 2018-06-21 (×2): qty 1

## 2018-06-21 MED ORDER — ENOXAPARIN SODIUM 40 MG/0.4ML ~~LOC~~ SOLN
40.0000 mg | SUBCUTANEOUS | Status: DC
  Administered 2018-06-23 – 2018-06-24 (×2): 40 mg via SUBCUTANEOUS
  Filled 2018-06-21 (×3): qty 0.4

## 2018-06-21 MED ORDER — METHYLPHENIDATE HCL 5 MG PO TABS
5.0000 mg | ORAL_TABLET | Freq: Two times a day (BID) | ORAL | Status: DC
  Filled 2018-06-21: qty 1

## 2018-06-21 MED ORDER — MORPHINE SULFATE (PF) 4 MG/ML IV SOLN
6.0000 mg | Freq: Once | INTRAVENOUS | Status: AC
  Administered 2018-06-21: 6 mg via INTRAVENOUS
  Filled 2018-06-21: qty 2

## 2018-06-21 MED ORDER — MORPHINE SULFATE (PF) 2 MG/ML IV SOLN
2.0000 mg | INTRAVENOUS | Status: DC | PRN
  Administered 2018-06-21 (×4): 2 mg via INTRAVENOUS
  Filled 2018-06-21 (×5): qty 1

## 2018-06-21 MED ORDER — ONDANSETRON 4 MG PO TBDP: 4.0000 mg | ORAL_TABLET | Freq: Four times a day (QID) | ORAL | Status: DC | PRN

## 2018-06-21 MED ORDER — IOPAMIDOL (ISOVUE-300) INJECTION 61%
100.0000 mL | Freq: Once | INTRAVENOUS | Status: AC | PRN
  Administered 2018-06-21: 100 mL via INTRAVENOUS

## 2018-06-21 MED ORDER — LACTATED RINGERS IV SOLN
INTRAVENOUS | Status: DC
  Administered 2018-06-21 – 2018-06-24 (×8): via INTRAVENOUS

## 2018-06-21 MED ORDER — PREDNISONE 20 MG PO TABS: 30.0000 mg | ORAL_TABLET | Freq: Every day | ORAL | Status: DC

## 2018-06-21 MED ORDER — ONDANSETRON HCL 4 MG/2ML IJ SOLN
INTRAMUSCULAR | Status: AC
  Filled 2018-06-21: qty 2

## 2018-06-21 MED ORDER — ONDANSETRON HCL 4 MG/2ML IJ SOLN
4.0000 mg | Freq: Four times a day (QID) | INTRAMUSCULAR | Status: DC | PRN
  Administered 2018-06-21 (×3): 4 mg via INTRAVENOUS
  Filled 2018-06-21 (×3): qty 2

## 2018-06-21 MED ORDER — HALOPERIDOL LACTATE 5 MG/ML IJ SOLN
2.5000 mg | Freq: Once | INTRAMUSCULAR | Status: AC
  Administered 2018-06-21: 2.5 mg via INTRAVENOUS
  Filled 2018-06-21: qty 1

## 2018-06-21 MED ORDER — ONDANSETRON HCL 4 MG/2ML IJ SOLN
4.0000 mg | Freq: Once | INTRAMUSCULAR | Status: AC | PRN
  Administered 2018-06-21: 4 mg via INTRAVENOUS

## 2018-06-21 MED ORDER — FAMOTIDINE IN NACL 20-0.9 MG/50ML-% IV SOLN
20.0000 mg | Freq: Two times a day (BID) | INTRAVENOUS | Status: DC
  Administered 2018-06-21 – 2018-06-24 (×8): 20 mg via INTRAVENOUS
  Filled 2018-06-21 (×8): qty 50

## 2018-06-21 NOTE — ED Triage Notes (Signed)
Patient c/o left chest pain radiating to back and abdomen. Patient reports 5 emeses, SOB, weakness. Patient reports he had cholecystectomy 1 month ago.
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
## 2018-06-21 NOTE — Progress Notes (Signed)
Spoke with Barnetta Chapel in radiology and let her know that Dr. Lysle Pearl did not want the bowel obstruction protocol done at this time. Terrial Rhodes

## 2018-06-21 NOTE — H&P (Signed)
Subjective:   CC: obstruction  HPI:  [name] is a 42 y.o. male who was consulted by Rifenbark for issue above.  Symptoms were first noted 1 day ago. Sudden onsent sharp, intense pain noted in periumbilical region.  Nausea vomiting followed shortly afterwards.  Pain was unrelenting so came over to the emergency department.  Last bowel movement was a small one few hours prior.  ED work-up noted leukocytosis, increased lactic acid, and a CT scan concerning for small bowel obstruction.  Patient had a lap chole 8 weeks ago at [facility] but otherwise no other significant surgical history.  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  Of note patient does complain of urinary hesitancy that started a few months ago along with unexplainable fasciculations and neurological symptoms.  Currently being worked up for possible ALS. Outpatient medications include prednisone    Past Medical History:  has a past medical history of Back injury due to skiing accident (2004), Cancer [facility]), GERD (gastroesophageal reflux disease), Heart murmur, History of radiation therapy (05/22/2016 - 07/05/2016), Mitral valve prolapse, Neuropathy, and Weight loss.  Past Surgical History:  Past Surgical History:  Procedure Laterality Date  . CHOLECYSTECTOMY N/A 04/25/2018   Procedure: LAPAROSCOPIC CHOLECYSTECTOMY;  Surgeon: Rolm Bookbinder, MD;  Location: [place];  Service: General;  Laterality: N/A;  . ELBOW SURGERY Right   . MUSCLE BIOPSY N/A 03/27/2018   Procedure: RIGHT GASTROCNEMIUS LEFT BICEP MUSCLE BIOPSY;  Surgeon: Ashok Pall, MD;  Location: [place];  Service: Neurosurgery;  Laterality: N/A;  RIGHT GASTROCNEMIUS LEFT BICEP MUSCLE BIOPSY  . NASOPHARYNGOSCOPY Left 04/14/2016   Procedure: BASE NASAL NASOPHARYNX;  Surgeon: Jerrell Belfast, MD;  Location: [place];  Service: ENT;  Laterality: Left;  . NECK  DISSECTION Left 07/04/2017   Dr. Conley Canal, [place] Left 04/14/2016   Procedure: TONSILLECTOMY AND BIOPSY OF TONGUE ;  Surgeon: Jerrell Belfast, MD;  Location: [place];  Service: ENT;  Laterality: Left;    Family History: family history includes Cancer in his brother, father, maternal grandfather, and mother.  Social History:  reports that he has never smoked. He has quit using smokeless tobacco. He reports that he drinks alcohol. He reports that he does not use drugs.  Current Medications:  (Not in a hospital admission)  Allergies:  Allergies as of 06/21/2018  . (No Known Allergies)    ROS:  A 15 point review of systems was performed and pertinent positives and negatives noted in HPI   Objective:     BP 136/88   Pulse 76   Temp 97.8 F (36.6 C) (Oral)   Resp 10   Wt 69 kg   SpO2 97%   BMI 21.83 kg/m   Constitutional :  alert, cooperative, appears stated age and no distress  Lymphatics/Throat:  no asymmetry, masses, or scars  Respiratory:  clear to auscultation bilaterally  Cardiovascular:  regular rate and rhythm  Gastrointestinal: soft, non-tender, no guarding, but decreased bowel sounds.   Musculoskeletal: Steady gait and movement  Skin: Cool and moist,    Psychiatric: Normal affect, non-agitated, not confused       LABS:  CMP Latest Ref Rng & Units 06/21/2018 04/16/2018 03/27/2018  Glucose 70 - 99 mg/dL 134(H) 101(H) 87  BUN 6 - 20 mg/dL 22(H) 14 15  Creatinine 0.61 - 1.24 mg/dL 0.94 1.07 1.09  Sodium  135 - 145 mmol/L 137 142 139  Potassium 3.5 - 5.1 mmol/L 3.5 5.0 4.0  Chloride 98 - 111 mmol/L 96(L) 106 104  CO2 22 - 32 mmol/L 32 27 28  Calcium 8.9 - 10.3 mg/dL 10.2 9.7 9.4  Total Protein 6.5 - 8.1 g/dL 7.5 6.8 -  Total Bilirubin 0.3 - 1.2 mg/dL 2.9(H) 2.0(H) -  Alkaline Phos 38 - 126 U/L 40 39 -  AST 15 - 41 U/L 19 19 -  ALT 0 - 44 U/L 18 15 -   CBC Latest Ref Rng & Units 06/21/2018 04/16/2018 03/27/2018   WBC 3.8 - 10.6 K/uL 14.7(H) 4.6 3.3(L)  Hemoglobin 13.0 - 18.0 g/dL 14.6 14.4 15.2  Hematocrit 40.0 - 52.0 % 41.8 43.4 43.7  Platelets 150 - 440 K/uL 263 233 197    RADS: CLINICAL DATA:  Left-sided chest pain radiating to the back and abdomen with 5 episodes of emesis. Cholecystectomy 1 month ago.  EXAM: CT ABDOMEN AND PELVIS WITH CONTRAST  TECHNIQUE: Multidetector CT imaging of the abdomen and pelvis was performed using the standard protocol following bolus administration of intravenous contrast.  CONTRAST:  149mL ISOVUE-300 IOPAMIDOL (ISOVUE-300) INJECTION 61%  COMPARISON:  Lumbar spine MRI 03/25/2018.  FINDINGS: Lower chest: Normal heart size.  Clear lung bases.  Hepatobiliary: Fatty infiltration along the falciform. Cholecystectomy. Mild steatosis of the liver.  Pancreas: Normal  Spleen: Normal  Adrenals/Urinary Tract: Normal bilateral adrenal glands and kidneys. No obstructive uropathy. Normal bladder.  Stomach/Bowel: Food and fluid distended stomach. Normal duodenal sweep and ligament of Treitz. Fluid-filled jejunal loops are identified in the mid abdomen likely secondary to adhesion in the central mesentery, series 2/51 in this patient status post cholecystectomy. The relatively flat appearing ileal loops and terminal ileum. Moderate stool retention within the colon. Normal appendix is visualized.  Vascular/Lymphatic: No significant vascular findings are present. No enlarged abdominal or pelvic lymph nodes.  Reproductive: Normal prostate and seminal vesicles.  Other: Small amount of free fluid in the pelvis likely secondary to small bowel obstruction.  Musculoskeletal: Stable anterior compression of L1.  IMPRESSION: 1. Small-bowel obstruction involving fluid-filled mid distal jejunal loops likely related to postop adhesion in the central mesentery. Associated small amount of free fluid in the pelvis. 2. Fatty infiltration along the  falciform. Status post cholecystectomy. 3. Remote L1 superior endplate compression fracture.   Electronically Signed   By: Ashley Royalty M.D.   On: 06/21/2018 02:55 Assessment:   SBO Report of Urinary hesitation Hx of tonsillar fossa CA Hx of fasiculations Elevated bilirubin  Plan:    SBO- Discussed pathophisiology and treatment options including NG tube decompression and possible surgery for lysis of adhesions, laparoscopic or open.  The risk of surgery include, but not limited to, recurrence, bleeding, chronic pain, post-op infxn, post-op SBO or ileus, hernias, resection of bowel, re-anastamosis, possible ostomy placement and need for re-operation to address said risks. The risks of general anesthetic, if used, includes MI, CVA, sudden death or even reaction to anesthetic medications also discussed. Alternatives include continued observation and NG decompression.  Benefits include possible symptom relief, preventing further decline in health and possible death. Due to lack of symptoms currently, will hold off on placing NG tube and monitor inhouse.  NPO.  Continue home meds including the gabapentin and prednisone.  Patient is a poor surgical candidate due to prednisone use.  Hope is spontaneous resolution of small bowel obstruction with n.p.o. Status, frequent ambulation.  History of urinary hesitancy.  Patient describes the  urge to go but unable to void and also complains of feeling of not being able to empty bladder completely.  Brief CT scan reviewed by myself noted possible calcifications of the prostate will review with radiologist in the morning and continue to monitor symptoms likely will not need any intervention during hospital stay.  Leukocytosis likely reactive secondary to small bowel obstruction we will monitor with serial CBCs.  Incidental elevated total bilirubin level.  We will fractionate and continue to monitor with serial exams.  Currently asymptomatic may consider  hospitalist consult for work-up while in-house.  History of fasciculations.  no work-up needed while in-house. will continue to monitor symptoms and continue home meds at this time  The patient verbalized understanding and all questions were answered to the patient's satisfaction.

## 2018-06-21 NOTE — Progress Notes (Signed)
Spoke with Dr.Sakai about small bowel prep order after NGT placement. H everbalized he did not want the small bowel prep done. [name]

## 2018-06-22 ENCOUNTER — Inpatient Hospital Stay: Admit: 2018-06-22 | Discharge: 2018-06-22 | Disposition: A | Discharge status: Home / Self Care | Payer: Self-pay

## 2018-06-22 ENCOUNTER — Other Ambulatory Visit: Payer: Self-pay | Admitting: Gastroenterology

## 2018-06-22 ENCOUNTER — Inpatient Hospital Stay: Payer: Managed Care, Other (non HMO)

## 2018-06-22 DIAGNOSIS — K5652 Intestinal adhesions [bands] with complete obstruction: Secondary | ICD-10-CM | POA: Diagnosis not present

## 2018-06-22 LAB — BASIC METABOLIC PANEL
Anion gap: 9 (ref 5–15)
BUN: 19 mg/dL (ref 6–20)
CALCIUM: 9 mg/dL (ref 8.9–10.3)
CO2: 29 mmol/L (ref 22–32)
CREATININE: 1.02 mg/dL (ref 0.61–1.24)
Chloride: 96 mmol/L — ABNORMAL LOW (ref 98–111)
GFR calc non Af Amer: >60 mL/min (ref >60)
Glucose, Bld: 125 mg/dL — ABNORMAL HIGH (ref 70–99)
Potassium: 4.3 mmol/L (ref 3.5–5.1)
SODIUM: 134 mmol/L — AB (ref 135–145)

## 2018-06-22 LAB — HEPATIC FUNCTION PANEL
ALBUMIN: 3.7 g/dL (ref 3.5–5.0)
ALK PHOS: 42 U/L (ref 38–126)
ALT: 21 U/L (ref 0–44)
AST: 18 U/L (ref 15–41)
BILIRUBIN DIRECT: 0.2 mg/dL (ref 0.0–0.2)
BILIRUBIN INDIRECT: 5.4 mg/dL — AB (ref 0.3–0.9)
BILIRUBIN TOTAL: 5.6 mg/dL — AB (ref 0.3–1.2)
Total Protein: 6.4 g/dL — ABNORMAL LOW (ref 6.5–8.1)

## 2018-06-22 LAB — PHOSPHORUS: PHOSPHORUS: 4.4 mg/dL (ref 2.5–4.6)

## 2018-06-22 LAB — CBC
HEMATOCRIT: 48.1 % (ref 40.0–52.0)
HEMOGLOBIN: 17 g/dL (ref 13.0–18.0)
MCH: 31.3 pg (ref 26.0–34.0)
MCHC: 35.3 g/dL (ref 32.0–36.0)
MCV: 88.7 fL (ref 80.0–100.0)
Platelets: 289 10*3/uL (ref 150–440)
RBC: 5.42 MIL/uL (ref 4.40–5.90)
RDW: 13.3 % (ref 11.5–14.5)
WBC: 14.3 10*3/uL — ABNORMAL HIGH (ref 3.8–10.6)

## 2018-06-22 LAB — MAGNESIUM: Magnesium: 2.1 mg/dL (ref 1.7–2.4)

## 2018-06-22 MED ORDER — GADOBENATE DIMEGLUMINE 529 MG/ML IV SOLN
14.0000 mL | Freq: Once | INTRAVENOUS | Status: AC | PRN
  Administered 2018-06-22: 14 mL via INTRAVENOUS

## 2018-06-22 MED ORDER — KETOROLAC TROMETHAMINE 15 MG/ML IJ SOLN
15.0000 mg | Freq: Once | INTRAMUSCULAR | Status: AC
  Administered 2018-06-22: 15 mg via INTRAVENOUS
  Filled 2018-06-22: qty 1

## 2018-06-22 NOTE — Progress Notes (Signed)
SURGICAL PROGRESS NOTE (cpt [phone])  Hospital Day(s): 1.   Post op day(s):  [name]   Interval History: Patient seen and examined, no acute events or new complaints overnight. Patient reports improved abdominal pain, denies N/V, fever/chills, CP, or SOB. He also reports a small loose yellow BM with a single episode of flatus. In regards to his elevated bilirubin, patient states his bilirubin became elevated following his neck irradiation as well, though says that could have in retrospect been related to his gallstones.  Review of Systems:  Constitutional: denies fever, chills  HEENT: denies cough or congestion  Respiratory: denies any shortness of breath  Cardiovascular: denies chest pain or palpitations  Gastrointestinal: abdominal pain, N/V, and bowel function as per interval history Genitourinary: denies burning with urination or urinary frequency Musculoskeletal: denies pain, decreased motor or sensation Integumentary: denies any other rashes or skin discolorations Neurological: denies HA or vision/hearing changes   Vital signs in last 24 hours: [min-max] current  Temp:  [97.8 F (36.6 C)-99.2 F (37.3 C)] 97.8 F (36.6 C) (09/07 0437) Pulse Rate:  [82-102] 98 (09/07 0437) Resp:  [16-20] 16 (09/07 0437) BP: (128-150)/(85-94) 128/85 (09/07 0437) SpO2:  [96 %-97 %] 97 % (09/07 0437)     Height: 5\' 10"  (177.8 cm) Weight: 68.9 kg BMI (Calculated): 21.79   Intake/Output this shift:  Total I/O In: 40 [NG/GT:40] Out: -    Intake/Output last 2 shifts:  @IOLAST2SHIFTS @   Physical Exam:  Constitutional: alert, cooperative and no distress  HENT: normocephalic without obvious abnormality  Eyes: +icterus, PERRL, EOM's grossly intact and symmetric  Neuro: CN II - XII grossly intact and symmetric without deficit  Respiratory: breathing non-labored at rest  Cardiovascular: regular rate and sinus rhythm  Gastrointestinal: soft and non-distended with minimal lower abdominal tenderness to  deep palpation Musculoskeletal: UE and LE FROM, no edema or wounds, motor and sensation grossly intact, NT   Labs:  CBC Latest Ref Rng & Units 06/22/2018 06/21/2018 04/16/2018  WBC 3.8 - 10.6 K/uL 14.3(H) 14.7(H) 4.6  Hemoglobin 13.0 - 18.0 g/dL 17.0 14.6 14.4  Hematocrit 40.0 - 52.0 % 48.1 41.8 43.4  Platelets 150 - 440 K/uL 289 263 233   CMP Latest Ref Rng & Units 06/22/2018 06/21/2018 04/16/2018  Glucose 70 - 99 mg/dL 125(H) 134(H) 101(H)  BUN 6 - 20 mg/dL 19 22(H) 14  Creatinine 0.61 - 1.24 mg/dL 1.02 0.94 1.07  Sodium 135 - 145 mmol/L 134(L) 137 142  Potassium 3.5 - 5.1 mmol/L 4.3 3.5 5.0  Chloride 98 - 111 mmol/L 96(L) 96(L) 106  CO2 22 - 32 mmol/L 29 32 27  Calcium 8.9 - 10.3 mg/dL 9.0 10.2 9.7  Total Protein 6.5 - 8.1 g/dL 6.4(L) 7.5 6.8  Total Bilirubin 0.3 - 1.2 mg/dL 5.6(H) 2.9(H) 2.0(H)  Alkaline Phos 38 - 126 U/L 42 40 39  AST 15 - 41 U/L 18 19 19   ALT 0 - 44 U/L 21 18 15    Direct conjugated bilirubin (06/22/2018): 5.4  Imaging studies: No new pertinent imaging studies   Assessment/Plan: (ICD-10's: K5.52) 42 y.o. male with resolving complete small bowel obstruction, complicated by hyperbilirubinemia (nearly all direct bilirubin) 8 weeks s/p laparoscopic cholecystectomy at [facility] and by pertinent comorbidities including history of tonsillar carcinoma s/p resection with lymph node dissection and radiation therapy, GERD, and mitral valve prolapse, also currently being worked up for possible ALS in context of recent urinary hesitancy and otherwise unexplained muscular fasciculations.  - NPO for now, IV fluids             -  NG tube to LIS for nasogastric decompression             - monitor ongoing bowel function and abdominal exam   - will check MRCP and obtain GI consult (discussed with Dr. Alice Reichert)             - anticipate symptomatic relief within 24 - 48 hours following NGT insertion, followed by "rumbling" the following day and flatus either the same day or the day  following the "rumbling" with anticipated length of stay ~3 - 5 days with successful non-operative management for 8 of 10 patients with small bowel obstruction attributed to post-surgical adhesions  - surgical intervention if doesn't improve was also discussed             - ambulation encouraged              - DVT prophylaxis  All of the above findings and recommendations were discussed with the patient and his RN, and all of patient's questions were answered to his expressed satisfaction.  -- Marilynne Drivers Rosana Hoes, MD, [place]: [facility] - Partnering for exceptional care. Office: [phone]

## 2018-06-23 DIAGNOSIS — K5652 Intestinal adhesions [bands] with complete obstruction: Secondary | ICD-10-CM | POA: Diagnosis not present

## 2018-06-23 LAB — BASIC METABOLIC PANEL
Anion gap: 7 (ref 5–15)
BUN: 18 mg/dL (ref 6–20)
CALCIUM: 8.8 mg/dL — AB (ref 8.9–10.3)
CO2: 35 mmol/L — ABNORMAL HIGH (ref 22–32)
CREATININE: 1.23 mg/dL (ref 0.61–1.24)
Chloride: 97 mmol/L — ABNORMAL LOW (ref 98–111)
GFR calc Af Amer: >60 mL/min (ref >60)
GFR calc non Af Amer: >60 mL/min (ref >60)
GLUCOSE: 108 mg/dL — AB (ref 70–99)
Potassium: 4.6 mmol/L (ref 3.5–5.1)
Sodium: 139 mmol/L (ref 135–145)

## 2018-06-23 LAB — CBC
HCT: 42 % (ref 40.0–52.0)
HEMOGLOBIN: 14.5 g/dL (ref 13.0–18.0)
MCH: 31.4 pg (ref 26.0–34.0)
MCHC: 34.6 g/dL (ref 32.0–36.0)
MCV: 90.7 fL (ref 80.0–100.0)
Platelets: 201 10*3/uL (ref 150–440)
RBC: 4.63 MIL/uL (ref 4.40–5.90)
RDW: 12.9 % (ref 11.5–14.5)
WBC: 9.9 10*3/uL (ref 3.8–10.6)

## 2018-06-23 LAB — HEPATIC FUNCTION PANEL
ALK PHOS: 37 U/L — AB (ref 38–126)
ALT: 24 U/L (ref 0–44)
AST: 18 U/L (ref 15–41)
Albumin: 3.4 g/dL — ABNORMAL LOW (ref 3.5–5.0)
BILIRUBIN DIRECT: 0.2 mg/dL (ref 0.0–0.2)
Indirect Bilirubin: 4.9 mg/dL — ABNORMAL HIGH (ref 0.3–0.9)
Total Bilirubin: 5.1 mg/dL — ABNORMAL HIGH (ref 0.3–1.2)
Total Protein: 5.7 g/dL — ABNORMAL LOW (ref 6.5–8.1)

## 2018-06-23 LAB — PHOSPHORUS: Phosphorus: 3 mg/dL (ref 2.5–4.6)

## 2018-06-23 LAB — MAGNESIUM: Magnesium: 2.2 mg/dL (ref 1.7–2.4)

## 2018-06-23 MED ORDER — PHENOL 1.4 % MT LIQD
1.0000 | OROMUCOSAL | Status: DC | PRN
  Administered 2018-06-24: 1 via OROMUCOSAL
  Filled 2018-06-23: qty 177

## 2018-06-23 MED ORDER — KETOROLAC TROMETHAMINE 30 MG/ML IJ SOLN
30.0000 mg | Freq: Once | INTRAMUSCULAR | Status: AC
  Administered 2018-06-23: 30 mg via INTRAVENOUS
  Filled 2018-06-23: qty 1

## 2018-06-23 NOTE — Progress Notes (Signed)
SURGICAL PROGRESS NOTE (cpt [phone])  Hospital Day(s): 2.   Post op day(s):  Marland Kitchen   Interval History: Patient seen and examined, no acute events or new complaints overnight. Patient reports his pain continues to improve overall with abdominal "rumbling" and occasional flatus. He otherwise denies N/V, fever/chills, CP, or SOB. MRCP ordered yesterday was completed late yesterday. Patient has been ambulating.  Review of Systems:  Constitutional: denies fever, chills  HEENT: denies cough or congestion  Respiratory: denies any shortness of breath  Cardiovascular: denies chest pain or palpitations  Gastrointestinal: abdominal pain, N/V, and bowel function as per interval history Genitourinary: denies burning with urination or urinary frequency Musculoskeletal: denies pain, decreased motor or sensation Integumentary: denies any other rashes or skin discolorations Neurological: denies HA or vision/hearing changes   Vital signs in last 24 hours: [min-max] current  Temp:  [98.2 F (36.8 C)-98.6 F (37 C)] 98.6 F (37 C) (09/08 0446) Pulse Rate:  [76-94] 76 (09/08 0446) Resp:  [16] 16 (09/08 0446) BP: (123-131)/(73-78) 123/73 (09/08 0446) SpO2:  [98 %-99 %] 98 % (09/08 0446)     Height: 5\' 10"  (177.8 cm) Weight: 68.9 kg BMI (Calculated): 21.79   Intake/Output this shift:  No intake/output data recorded.   Intake/Output last 2 shifts:  @IOLAST2SHIFTS @   Physical Exam:  Constitutional: alert, cooperative and no distress  HENT: normocephalic without obvious abnormality  Eyes: +scleral icterus unchanged, PERRL, EOM's grossly intact and symmetric  Neuro: CN II - XII grossly intact and symmetric without deficit  Respiratory: breathing non-labored at rest  Cardiovascular: regular rate and sinus rhythm  Gastrointestinal: soft and non-distended with minimal abdominal tenderness to palpation Musculoskeletal: UE and LE FROM, no edema or wounds, motor and sensation grossly intact, NT   Labs:   CBC Latest Ref Rng & Units 06/23/2018 06/22/2018 06/21/2018  WBC 3.8 - 10.6 K/uL 9.9 14.3(H) 14.7(H)  Hemoglobin 13.0 - 18.0 g/dL 14.5 17.0 14.6  Hematocrit 40.0 - 52.0 % 42.0 48.1 41.8  Platelets 150 - 440 K/uL 201 289 263   CMP Latest Ref Rng & Units 06/23/2018 06/22/2018 06/21/2018  Glucose 70 - 99 mg/dL 108(H) 125(H) 134(H)  BUN 6 - 20 mg/dL 18 19 22(H)  Creatinine 0.61 - 1.24 mg/dL 1.23 1.02 0.94  Sodium 135 - 145 mmol/L 139 134(L) 137  Potassium 3.5 - 5.1 mmol/L 4.6 4.3 3.5  Chloride 98 - 111 mmol/L 97(L) 96(L) 96(L)  CO2 22 - 32 mmol/L 35(H) 29 32  Calcium 8.9 - 10.3 mg/dL 8.8(L) 9.0 10.2  Total Protein 6.5 - 8.1 g/dL 5.7(L) 6.4(L) 7.5  Total Bilirubin 0.3 - 1.2 mg/dL 5.1(H) 5.6(H) 2.9(H)  Alkaline Phos 38 - 126 U/L 37(L) 42 40  AST 15 - 41 U/L 18 18 19   ALT 0 - 44 U/L 24 21 18    Imaging studies:  MRCP (06/22/2018) Prior cholecystectomy. No evidence of choledocholithiasis or biliary obstruction.  New fluid within gallbladder fossa and throughout the abdomen, suspicious for postop bile leak. Consider nuclear medicine hepatobiliary scan for further evaluation.  Increased dilated small bowel loops with wall thickening throughout the abdomen. Pelvic bowel loops are not visualized on this exam, and this could be due to a reactive ileus or small-bowel obstruction.  Assessment/Plan: (ICD-10's: K31.52) 42 y.o. malewith resolving complete small bowel obstruction vs reactive ileus in context of hyperbilirubinemia (nearly all direct bilirubin), attributable to possible bile leak suspected on MRCP yesterday, 8 weeks s/p laparoscopic cholecystectomy at [facility] and complicated by pertinent comorbidities including history of tonsillar  carcinoma s/p resection with lymph node dissection and radiation therapy, GERD, and mitral valve prolapse, also currently being worked up for possible ALS in context of recent urinary hesitancy and otherwise unexplained muscular fasciculations.  - NPO for  now, IV fluids - NG tube to LIS for nasogastric decompression - monitor ongoing bowel function and abdominal exam              - will check HIDA to further evaluate for bile leak, discussed with GI consultant (Dr. Alice Reichert)  - if there a bile leak is confirmed on HIDA, particularly from cystic duct stump, may require ERCP             - surgical intervention if doesn't improve was also discussed - ambulation encouraged - DVT prophylaxis  All of the above findings and recommendations were discussed with the patient and his RN, and all of patient's questions were answered to his expressed satisfaction.  -- Marilynne Drivers Rosana Hoes, MD, [place]: [facility] - Partnering for exceptional care. Office: [phone]

## 2018-06-23 NOTE — Progress Notes (Signed)
RN called [facility] to find out at what time was the pt's HIDA scan was going to be done. No one answer the phone at [facility]. The answering machine said they are only open Monday to Friday from 7 am to 4 pm.
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
## 2018-06-23 NOTE — Progress Notes (Signed)
Patient was complaining of headache, eye pain, nose pain. Was requesting pain med. I called Dr Rosana Hoes and he advised give 1 STAT dose of toradol 30 mg IV.

## 2018-06-24 ENCOUNTER — Inpatient Hospital Stay: Admit: 2018-06-24 | Discharge: 2018-06-24 | Disposition: A | Discharge status: Home / Self Care | Payer: Self-pay

## 2018-06-24 ENCOUNTER — Inpatient Hospital Stay: Payer: Managed Care, Other (non HMO)

## 2018-06-24 ENCOUNTER — Encounter: Payer: Self-pay | Admitting: Internal Medicine

## 2018-06-24 LAB — HEPATIC FUNCTION PANEL
ALK PHOS: 40 U/L (ref 38–126)
ALT: 20 U/L (ref 0–44)
AST: 17 U/L (ref 15–41)
Albumin: 3.2 g/dL — ABNORMAL LOW (ref 3.5–5.0)
BILIRUBIN INDIRECT: 3.7 mg/dL — AB (ref 0.3–0.9)
Bilirubin, Direct: 0.3 mg/dL — ABNORMAL HIGH (ref 0.0–0.2)
TOTAL PROTEIN: 5.8 g/dL — AB (ref 6.5–8.1)
Total Bilirubin: 4 mg/dL — ABNORMAL HIGH (ref 0.3–1.2)

## 2018-06-24 LAB — BASIC METABOLIC PANEL
Anion gap: 8 (ref 5–15)
BUN: 16 mg/dL (ref 6–20)
CALCIUM: 8.5 mg/dL — AB (ref 8.9–10.3)
CO2: 30 mmol/L (ref 22–32)
CREATININE: 0.81 mg/dL (ref 0.61–1.24)
Chloride: 100 mmol/L (ref 98–111)
Glucose, Bld: 93 mg/dL (ref 70–99)
Potassium: 4 mmol/L (ref 3.5–5.1)
SODIUM: 138 mmol/L (ref 135–145)

## 2018-06-24 LAB — CBC
HEMATOCRIT: 36.3 % — AB (ref 40.0–52.0)
Hemoglobin: 12.9 g/dL — ABNORMAL LOW (ref 13.0–18.0)
MCH: 31.8 pg (ref 26.0–34.0)
MCHC: 35.5 g/dL (ref 32.0–36.0)
MCV: 89.5 fL (ref 80.0–100.0)
PLATELETS: 161 10*3/uL (ref 150–440)
RBC: 4.05 MIL/uL — ABNORMAL LOW (ref 4.40–5.90)
RDW: 12.8 % (ref 11.5–14.5)
WBC: 6.9 10*3/uL (ref 3.8–10.6)

## 2018-06-24 LAB — PHOSPHORUS: PHOSPHORUS: 2.6 mg/dL (ref 2.5–4.6)

## 2018-06-24 LAB — MAGNESIUM: MAGNESIUM: 2.1 mg/dL (ref 1.7–2.4)

## 2018-06-24 MED ORDER — TECHNETIUM TC 99M MEBROFENIN IV KIT
8.1300 | PACK | Freq: Once | INTRAVENOUS | Status: AC | PRN
  Administered 2018-06-24: 8.13 via INTRAVENOUS

## 2018-06-24 MED ORDER — ACETAMINOPHEN 10 MG/ML IV SOLN
1000.0000 mg | Freq: Once | INTRAVENOUS | Status: AC
  Administered 2018-06-24: 1000 mg via INTRAVENOUS
  Filled 2018-06-24 (×4): qty 100

## 2018-06-24 MED ORDER — KCL IN DEXTROSE-NACL 40-5-0.45 MEQ/L-%-% IV SOLN
INTRAVENOUS | Status: DC
  Administered 2018-06-24 – 2018-06-25 (×2): via INTRAVENOUS
  Filled 2018-06-24 (×4): qty 1000

## 2018-06-24 MED ORDER — KETOROLAC TROMETHAMINE 30 MG/ML IJ SOLN
30.0000 mg | Freq: Once | INTRAMUSCULAR | Status: AC
  Administered 2018-06-24: 30 mg via INTRAVENOUS
  Filled 2018-06-24: qty 1

## 2018-06-24 NOTE — Progress Notes (Signed)
OK per MD to D/C NG tube and progress to regular diet.

## 2018-06-24 NOTE — Progress Notes (Signed)
Transport at bedside to take pt to procedure.

## 2018-06-24 NOTE — Progress Notes (Signed)
Per MD okay for pt to take a shower.

## 2018-06-24 NOTE — Progress Notes (Signed)
Pt returned to floor

## 2018-06-24 NOTE — Progress Notes (Signed)
Subjective:  CC:  [name] is a 42 y.o. male  Hospital stay day 3,   bowel obstruction, elevated bilirubin.  HPI: Pain improved, multiple BMs this am.    ROS:  A 5 point review of systems was performed and pertinent positives and negatives noted in HPI.   Objective:      Temp:  [97.9 F (36.6 C)-98.4 F (36.9 C)] 97.9 F (36.6 C) (09/09 1135) Pulse Rate:  [72-82] 77 (09/09 1135) Resp:  [18] 18 (09/09 0546) BP: (119-134)/(72-79) 119/75 (09/09 1135) SpO2:  [98 %-100 %] 100 % (09/09 1135)     Height: 5\' 10"  (177.8 cm) Weight: 68.9 kg BMI (Calculated): 21.79   Intake/Output this shift:  Total I/O In: -  Out: 100 [Urine:100]       Constitutional :  alert, cooperative, appears stated age and no distress  Respiratory:  clear to auscultation bilaterally  Cardiovascular:  regular rate and rhythm  Gastrointestinal: soft, non-tender; bowel sounds normal; no masses,  no organomegaly.   Skin: Cool and moist.   Psychiatric: Normal affect, non-agitated, not confused       LABS:  CMP Latest Ref Rng & Units 06/24/2018 06/23/2018 06/22/2018  Glucose 70 - 99 mg/dL 93 108(H) 125(H)  BUN 6 - 20 mg/dL 16 18 19   Creatinine 0.61 - 1.24 mg/dL 0.81 1.23 1.02  Sodium 135 - 145 mmol/L 138 139 134(L)  Potassium 3.5 - 5.1 mmol/L 4.0 4.6 4.3  Chloride 98 - 111 mmol/L 100 97(L) 96(L)  CO2 22 - 32 mmol/L 30 35(H) 29  Calcium 8.9 - 10.3 mg/dL 8.5(L) 8.8(L) 9.0  Total Protein 6.5 - 8.1 g/dL 5.8(L) 5.7(L) 6.4(L)  Total Bilirubin 0.3 - 1.2 mg/dL 4.0(H) 5.1(H) 5.6(H)  Alkaline Phos 38 - 126 U/L 40 37(L) 42  AST 15 - 41 U/L 17 18 18   ALT 0 - 44 U/L 20 24 21    CBC Latest Ref Rng & Units 06/24/2018 06/23/2018 06/22/2018  WBC 3.8 - 10.6 K/uL 6.9 9.9 14.3(H)  Hemoglobin 13.0 - 18.0 g/dL 12.9(L) 14.5 17.0  Hematocrit 40.0 - 52.0 % 36.3(L) 42.0 48.1  Platelets 150 - 440 K/uL 161 201 289    RADS: CLINICAL DATA:  Status post cholecystectomy 04/25/2018. Admitted for small bowel obstruction.  Hyperbilirubinemia.  EXAM: NUCLEAR MEDICINE HEPATOBILIARY IMAGING  TECHNIQUE: Sequential images of the abdomen were obtained out to 60 minutes following intravenous administration of radiopharmaceutical.  RADIOPHARMACEUTICALS:  8.1 mCi Tc-71m  Choletec IV  COMPARISON:  06/22/2018 MRI abdomen.  FINDINGS: Prompt uptake and biliary excretion of activity by the liver is seen. Biliary activity passes into small bowel, consistent with patent common bile duct. Retrograde activity is seen in the stomach. No evidence of a bile leak into the cholecystectomy bed or peritoneal cavity.  IMPRESSION: Prompt radiotracer excretion into the common bile duct and small bowel, compatible with patent CBD. No scintigraphic evidence of bile leak.   Electronically Signed   By: Ilona Sorrel M.D.   On: 06/24/2018 09:09 Assessment:   SBO- Clinically resolving.  Advance to clears while NG clamped, and continue to monitor. If tolerates, will pull NG and resume regular diet.  Not sure if this is true obstruction or possible ileus from a self resolving bile leak, although leaks this far out from lap chole will be extremely rare.   History of urinary hesitancy.  Patient describes the urge to go but unable to void and also complains of feeling of not being able to empty bladder completely.  Brief CT scan reviewed by myself noted possible calcifications of the prostate will review with radiologist in the morning and continue to monitor symptoms likely will not need any intervention during hospital stay.  Leukocytosis-resolved  Incidental elevated total bilirubin level.  decreasing, will continue to monitor. Since imaging workup negative and trending downward, will hold off contacting Dr. Allen Norris for possible ERCP at this time.  History of fasciculations.  no work-up needed while in-house. will continue to monitor symptoms and continue home meds at this time  The patient verbalized understanding and  all questions were answered to the patient's satisfaction.

## 2018-06-25 LAB — PHOSPHORUS: Phosphorus: 2.4 mg/dL — ABNORMAL LOW (ref 2.5–4.6)

## 2018-06-25 LAB — BASIC METABOLIC PANEL
ANION GAP: 3 — AB (ref 5–15)
BUN: 9 mg/dL (ref 6–20)
CHLORIDE: 105 mmol/L (ref 98–111)
CO2: 30 mmol/L (ref 22–32)
Calcium: 8.7 mg/dL — ABNORMAL LOW (ref 8.9–10.3)
Creatinine, Ser: 0.88 mg/dL (ref 0.61–1.24)
GFR calc Af Amer: >60 mL/min (ref >60)
GLUCOSE: 116 mg/dL — AB (ref 70–99)
POTASSIUM: 4.4 mmol/L (ref 3.5–5.1)
Sodium: 138 mmol/L (ref 135–145)

## 2018-06-25 LAB — HEPATIC FUNCTION PANEL
ALK PHOS: 36 U/L — AB (ref 38–126)
ALT: 17 U/L (ref 0–44)
AST: 16 U/L (ref 15–41)
Albumin: 3.3 g/dL — ABNORMAL LOW (ref 3.5–5.0)
BILIRUBIN DIRECT: 0.2 mg/dL (ref 0.0–0.2)
BILIRUBIN TOTAL: 1.6 mg/dL — AB (ref 0.3–1.2)
Indirect Bilirubin: 1.4 mg/dL — ABNORMAL HIGH (ref 0.3–0.9)
Total Protein: 5.8 g/dL — ABNORMAL LOW (ref 6.5–8.1)

## 2018-06-25 LAB — CBC
HCT: 34.3 % — ABNORMAL LOW (ref 40.0–52.0)
HEMOGLOBIN: 12.3 g/dL — AB (ref 13.0–18.0)
MCH: 32.2 pg (ref 26.0–34.0)
MCHC: 36 g/dL (ref 32.0–36.0)
MCV: 89.4 fL (ref 80.0–100.0)
Platelets: 171 10*3/uL (ref 150–440)
RBC: 3.84 MIL/uL — AB (ref 4.40–5.90)
RDW: 12.8 % (ref 11.5–14.5)
WBC: 4.8 10*3/uL (ref 3.8–10.6)

## 2018-06-25 LAB — MAGNESIUM: Magnesium: 2.2 mg/dL (ref 1.7–2.4)

## 2018-06-25 NOTE — Discharge Summary (Signed)
Physician Discharge Summary  Patient ID: [name] MRN: [id number] DOB/AGE: 1976/09/10 42 y.o.  Admit date: 06/21/2018 Discharge date: 06/25/2018  Admission Diagnoses: SBO, leukocytosis, urinary hesitancy, hyperbilirubinemia, hx of fasciculations  Discharge Diagnoses:  Same as above  Discharged Condition: good  Hospital Course: Patient came to the ED with a diagnosis of small bowel obstruction incidental increased bilirubin levels.  Small bowel obstruction resolved with NG tube decompression gradual restart of diet after confirmed bowel movements and passing flatus.  The bilirubin level initially continued increased therefore MRCP and HIDA scan was ordered to look for possible retained stone versus a leak, both of which were negative.  The subsequent bilirubin levels also decreased without any specific intervention.  At the time of discharge patient was asymptomatic pain-free and bilirubin level was continuing to downtrend.  He was deemed stable enough to be discharged from the hospital at this time.  Consults: GI  Discharge Exam: Blood pressure (!) 101/58, pulse 68, temperature 98.2 F (36.8 C), temperature source Oral, resp. rate 17, height 5\' 10"  (1.778 m), weight 68.9 kg, SpO2 99 %. General appearance: alert, cooperative and no distress Resp: clear to auscultation bilaterally Cardio: regular rate and rhythm GI: soft, non-tender; bowel sounds normal; no masses,  no organomegaly  Disposition:  Discharge disposition: 01-Home or Self Care       Discharge Instructions    Discharge patient   Complete by:  As directed    Discharge disposition:  01-Home or Self Care   Discharge patient date:  06/25/2018     Allergies as of 06/25/2018   No Known Allergies     Medication List    TAKE these medications   acetaminophen 325 MG tablet Commonly known as:  TYLENOL Take 650 mg by mouth every 6 (six) hours as needed for moderate pain or headache.   gabapentin 100 MG  capsule Commonly known as:  NEURONTIN Take 300 mg by mouth 4 (four) times daily.   methylphenidate 5 MG tablet Commonly known as:  RITALIN Take 1 tablet (5 mg total) by mouth 2 (two) times daily.   predniSONE 20 MG tablet Commonly known as:  DELTASONE Take 30-40 mg by mouth daily. TAKE 2 TABLETS (40MG ) DAILY X2 WEEKS, THEN 1.5 TABLETS (30MG ) BY MOUTH DAILY X2 WEEKS.   sodium fluoride 1.1 % Gel dental gel Commonly known as:  FLUORISHIELD Instill one drop of gel per tooth space of fluoride tray. Place over teeth for 5 minutes. Remove. Spit out excess. Repeat nightly.   vitamin E 400 UNIT capsule Take 400 IU daily x 1 week, then 400 IU BID         Total time spent arranging discharge was >82min. Signed: Benjamine Sprague 06/25/2018, 8:28 AM

## 2018-07-04 ENCOUNTER — Telehealth: Payer: Self-pay | Admitting: *Deleted

## 2018-07-04 NOTE — Telephone Encounter (Addendum)
Oncology Nurse Navigator Documentation  Called [name] to check on his well-being.  He reported:   Doing better since DC from [place] but is concerned that the obstruction may be r/t something other than inactivity as suggested, ie cancer.    He had nerve induction study done 8/23 at [facility] with Dr. Warnell Forester, results unremarkable.  He has follow-up scheduled with Dr. Warnell Forester 10/23.    In absence of VA benefits and SS Disability, (ie zero income since separation from [employer]), he has sold his home (closing 10/18) and moving to [place], to live with his mother, expects to be there shortly after closing.   I encouraged him to communicate any needs to me re referrals from his [place] team as he gets settled in [place] and establishes with medical practices.  He voiced understanding.  [name], RN, BSN Head & Neck Oncology Nurse [place] at [place] [phone]

## 2018-07-10 ENCOUNTER — Other Ambulatory Visit: Payer: Self-pay | Admitting: Internal Medicine

## 2018-07-10 ENCOUNTER — Telehealth: Payer: Self-pay

## 2018-07-10 MED ORDER — METHYLPHENIDATE HCL 5 MG PO TABS: 5.0000 mg | ORAL_TABLET | Freq: Two times a day (BID) | ORAL | 0 refills | Status: AC

## 2018-07-10 NOTE — Telephone Encounter (Signed)
Received message from patient that he needs a refill of the R[place]in and that he is moving on October 18th and would like a follow up appointment with Dr. Mickeal Skinner prior to that date. Spoke with Dr. Mickeal Skinner and refill prescription was sent to pharmacy. Left message for patient to call back to get follow up appointment scheduled.

## 2018-07-12 ENCOUNTER — Encounter: Payer: Self-pay | Admitting: Internal Medicine

## 2018-07-15 DIAGNOSIS — K5652 Intestinal adhesions [bands] with complete obstruction: Secondary | ICD-10-CM

## 2018-07-17 ENCOUNTER — Inpatient Hospital Stay: Payer: Managed Care, Other (non HMO) | Attending: Internal Medicine | Admitting: Internal Medicine

## 2018-07-17 VITALS — BP 114/79 | HR 78 | Temp 97.9°F | Resp 18 | Ht 70.0 in | Wt 150.8 lb

## 2018-07-17 DIAGNOSIS — G540 Brachial plexus disorders: Secondary | ICD-10-CM | POA: Diagnosis present

## 2018-07-17 DIAGNOSIS — Z87891 Personal history of nicotine dependence: Secondary | ICD-10-CM | POA: Diagnosis not present

## 2018-07-17 DIAGNOSIS — Z79899 Other long term (current) drug therapy: Secondary | ICD-10-CM | POA: Insufficient documentation

## 2018-07-17 DIAGNOSIS — Z923 Personal history of irradiation: Secondary | ICD-10-CM

## 2018-07-17 DIAGNOSIS — Z8589 Personal history of malignant neoplasm of other organs and systems: Secondary | ICD-10-CM | POA: Diagnosis not present

## 2018-07-17 DIAGNOSIS — W888XXA Exposure to other ionizing radiation, initial encounter: Secondary | ICD-10-CM

## 2018-07-17 MED ORDER — BACLOFEN 10 MG PO TABS: 10.0000 mg | ORAL_TABLET | Freq: Two times a day (BID) | ORAL | 3 refills | Status: AC

## 2018-07-17 NOTE — Progress Notes (Signed)
[facility] [place], [place] [address] [phone]   Interval Evaluation  Date of Service: 07/17/18 Patient Name: [name] Patient MRN: [id number] Patient DOB: 04-Mar-1976 Provider: Ventura Sellers, MD  Identifying Statement:  [name] is a 42 y.o. male with Radiation-induced brachial plexopathy [G54.0, W88.8XXA], fasciculations, weakness  Referring Provider: Alroy Dust, L.Marlou Sa, [place] [employer] [place], [place] [address]  Primary Cancer: Squamous cell tonsillar  Oncologic History:   Cancer of tonsillar fossa ([place])   03/03/2016 Imaging    CT neck: Left jugulodigastric adenopathy correlating with palpable complaint.  Tonsils symmetrically prominent for age.     03/27/2016 Initial Biopsy    Left neck mass (consult slides), fine needle aspirate: Malignant cells present, favor squamous cell carcinoma.     04/07/2016 PET scan    Solitary (L) level 2 LN enlarged and exhibits malignant range FDG uptake.  Symmetric uptake in both tonsillar regions. No definite findings identified to suggest primary site of disease. No evidence of metastatic disease to chest, abdomen, pelvis, or bony structures.     04/14/2016 Procedure    Biopsy of left tonsil: (+) invasive squamous cell carcinoma, p16+.   Biopsies of nasopharynx x 4 (benign), base of tongue x 2 (benign), and left oropharynx (benign).     04/21/2016 Initial Diagnosis    Cancer of tonsillar fossa ([place])    05/22/2016 - 07/05/2016 Radiation Therapy    Tonsils and bilateral neck / 70 Gy in 35 fractions to gross disease, 63 Gy in 35 fractions to high risk nodal echelons, and 56 Gy in 35 fractions to intermediate risk nodal echelons     Interval History:  [name] presents today for follow up consultation and repeat EMG at [facility] neurom[facility].  He continues to complain of weakness, atrophy in the left arm and both legs, fasciculations "all over including face and  tongue".  His left arm is still weak and clumsy as prior.  The numbness in the left hand and arm is static, but there are still no sensory symptoms in the remaining limbs. Unfortunately he was dismissed from his job and will now be moving to [place], [place] with family.    Medications: Current Outpatient Medications on File Prior to Visit  Medication Sig Dispense Refill  . acetaminophen (TYLENOL) 325 MG tablet Take 650 mg by mouth every 6 (six) hours as needed for moderate pain or headache.     . cyclobenzaprine (FLEXERIL) 10 MG tablet Take 1 tablet by mouth as needed.    . fentaNYL (DURAGESIC - DOSED MCG/HR) 12 MCG/HR Place 1 patch onto the skin every 3 (three) days.    Marland Kitchen gabapentin (NEURONTIN) 600 MG tablet Take 600 mg by mouth 3 (three) times daily.  0  . methylphenidate (RITALIN) 5 MG tablet Take 1 tablet (5 mg total) by mouth 2 (two) times daily. 60 tablet 0  . sodium fluoride (FLUORISHIELD) 1.1 % GEL dental gel Instill one drop of gel per tooth space of fluoride tray. Place over teeth for 5 minutes. Remove. Spit out excess. Repeat nightly. (Patient not taking: Reported on 06/21/2018) 120 mL prn  . vitamin E 400 UNIT capsule Take 400 IU daily x 1 week, then 400 IU BID (Patient not taking: Reported on 06/21/2018) 60 capsule 5   No current facility-administered medications on file prior to visit.     Allergies: No Known Allergies Past Medical History:  Past Medical History:  Diagnosis Date  . Back injury due to skiing accident 2004   jet ski accident  . Cancer [facility])    tosillar, s/p radiation therapy  . GERD (gastroesophageal reflux disease)   . Heart murmur    MVP - "nothing to be concerned about" " Had all my life"  . History of radiation therapy 05/22/2016 - 07/05/2016   Tonsils and bilateral neck 70 Gy 35 fractions to gross disease, 63 Gy 35 fractions to high risk nodal echelons, 56 Gy 35 fractions to intermediate risk nodal echelons  . Mitral valve prolapse   . Neuropathy    left arm   . Weight loss    Past Surgical History:  Past Surgical History:  Procedure Laterality Date  . CHOLECYSTECTOMY N/A 04/25/2018   Procedure: LAPAROSCOPIC CHOLECYSTECTOMY;  Surgeon: Rolm Bookbinder, MD;  Location: [facility];  Service: General;  Laterality: N/A;  . ELBOW SURGERY Right   . MUSCLE BIOPSY N/A 03/27/2018   Procedure: RIGHT GASTROCNEMIUS LEFT BICEP MUSCLE BIOPSY;  Surgeon: Ashok Pall, MD;  Location: [facility];  Service: Neurosurgery;  Laterality: N/A;  RIGHT GASTROCNEMIUS LEFT BICEP MUSCLE BIOPSY  . NASOPHARYNGOSCOPY Left 04/14/2016   Procedure: BASE NASAL NASOPHARYNX;  Surgeon: Jerrell Belfast, MD;  Location: [facility];  Service: ENT;  Laterality: Left;  . NECK DISSECTION Left 07/04/2017   Dr. Conley Canal, [place] Left 04/14/2016   Procedure: TONSILLECTOMY AND BIOPSY OF TONGUE ;  Surgeon: Jerrell Belfast, MD;  Location: [facility];  Service: ENT;  Laterality: Left;   Social History:  Social History   Socioeconomic History  . Marital status: Married    Spouse name: Not on file  . Number of children: 0  . Years of education: 30  . Highest education level: Not on file  Occupational History    Comment: [employer]  . Financial resource strain: Not on file  . Food insecurity:    Worry: Not on file    Inability: Not on file  . Transportation needs:    Medical: Not on file    Non-medical: Not on file  Tobacco Use  . Smoking status: Never Smoker  . Smokeless tobacco: Former Systems developer  . Tobacco comment: snuff- 1 -2 times only  Substance and Sexual Activity  . Alcohol use: Yes    Alcohol/week: 0.0 standard drinks    Comment: social: Research scientist (life sciences) at [employer]  . Drug use: No  . Sexual activity: Not on file  Lifestyle  . Physical activity:    Days per week: Not on file    Minutes per session: Not on file  . Stress: Not on file  Relationships  . Social  connections:    Talks on phone: Not on file    Gets together: Not on file    Attends religious service: Not on file    Active member of club or organization: Not on file    Attends meetings of clubs or organizations: Not on file    Relationship status: Not on file  . Intimate partner violence:    Fear of current or ex partner: Not on file    Emotionally abused: Not on file    Physically abused: Not on file    Forced sexual activity: Not on file  Other Topics Concern  . Not on file  Social History Narrative   Lives with wife   caffeine , rare   Family History:  Family  History  Problem Relation Age of Onset  . Cancer Mother        Breast  . Cancer Father        Lung  . Cancer Brother        brain  . Cancer Maternal Grandfather        leukemia    Review of Systems: Constitutional: Denies fevers, chills or abnormal weight loss Eyes: Denies blurriness of vision Ears, nose, mouth, throat, and face: Denies mucositis or sore throat Respiratory: Denies cough, dyspnea or wheezes Cardiovascular: Denies palpitation, chest discomfort or lower extremity swelling Gastrointestinal:  Denies nausea, constipation, diarrhea GU: Denies dysuria or incontinence Skin: Denies abnormal skin rashes Neurological: Per HPI Musculoskeletal: +chronic back pain Behavioral/Psych: ++stress   Physical Exam: Vitals:   07/17/18 0950  BP: 114/79  Pulse: 78  Resp: 18  Temp: 97.9 F (36.6 C)  SpO2: 100%   KPS: 80. General: Alert, cooperative, pleasant, in no acute distress Head: Craniotomy scar noted, dry and intact. EENT: No conjunctival injection or scleral icterus. Oral mucosa moist Lungs: Resp effort normal Cardiac: Regular rate and rhythm Abdomen: Soft, non-distended abdomen Skin: No rashes cyanosis or petechiae. Extremities: No clubbing or edema  Neurologic Exam: Mental Status: Awake, alert, attentive to examiner. Oriented to self and environment. Language is fluent with intact  comprehension.  Cranial Nerves: Visual acuity is grossly normal. Visual fields are full. Extra-ocular movements intact. No ptosis. Face is symmetric, tongue midline but fasciculations noted. Motor: Tone and bulk are normal aside from left scapular, deltoid and thenar atrophy.  Fasciculations easily visible on right calf, thigh, arm and persistent along scapular ridge. Left arm is 4+/5 proximally at shoulder, and distally there is subtle weakness affecting left hand. Unable to obtain brachiaradialis, biceps or triceps reflexes in the left arm.  Otherwise reflexes trace/diminshed throughout. Intact finger to nose bilaterally Sensory: Impaired to light touch along left arm and hand medially, as well as bilateral lower legs to upper shins. Gait: Normal and tandem gait is normal.   Labs: I have reviewed the data as listed    Component Value Date/Time   NA 138 06/25/2018 0440   NA 139 08/21/2016 1405   K 4.4 06/25/2018 0440   K 4.5 08/21/2016 1405   CL 105 06/25/2018 0440   CL 103 10/22/2012 0146   CO2 30 06/25/2018 0440   CO2 29 08/21/2016 1405   GLUCOSE 116 (H) 06/25/2018 0440   GLUCOSE 93 08/21/2016 1405   BUN 9 06/25/2018 0440   BUN 10.8 08/21/2016 1405   CREATININE 0.88 06/25/2018 0440   CREATININE 1.05 02/25/2018 1317   CREATININE 0.8 08/21/2016 1405   CALCIUM 8.7 (L) 06/25/2018 0440   CALCIUM 9.9 08/21/2016 1405   PROT 5.8 (L) 06/25/2018 0440   PROT 7.3 08/21/2016 1405   ALBUMIN 3.3 (L) 06/25/2018 0440   ALBUMIN 4.2 08/21/2016 1405   AST 16 06/25/2018 0440   AST 15 08/21/2016 1405   ALT 17 06/25/2018 0440   ALT 12 08/21/2016 1405   ALKPHOS 36 (L) 06/25/2018 0440   ALKPHOS 48 08/21/2016 1405   BILITOT 1.6 (H) 06/25/2018 0440   BILITOT 2.17 (H) 08/21/2016 1405   GFRNONAA >60 06/25/2018 0440   GFRNONAA >60 02/25/2018 1317   GFRNONAA >60 10/22/2012 0146   GFRAA >60 06/25/2018 0440   GFRAA >60 02/25/2018 1317   GFRAA >60 10/22/2012 0146   Lab Results  Component Value Date     WBC 4.8 06/25/2018   NEUTROABS 3.4 04/16/2018  HGB 12.3 (L) 06/25/2018   HCT 34.3 (L) 06/25/2018   MCV 89.4 06/25/2018   PLT 171 06/25/2018      Assessment/Plan 1. Radiation-induced brachial plexopathy  2. Fasiculations/myopathy  Mr. Wesby had consultation with Dr. Warnell Forester at [facility] which prompted further workup, including MRI of bilateral brachial plexus and cervical spine, repeat EMG with repetitive stim, and extensive serologies including paraneoplastic auto-antibodies.   The MRI demonstrated bilateral symmetric inflammation affecting the brachial plexi.  EMG showed active denervation localizing to upper trunk of left plexus (C5-C6).  Paraneoplastic panel was unremarkable.  We have offered Baclofen 10mg  daily for cramps and spasms in his neck.  Further workup for his as-yet undiagnosied neuromuscular disorder will be carried out at [facility].  We will make appropriate referral and transfer records as needed.  We appreciate the opportunity to participate in the care of [name].  We will process the referral and continue to communicate and participate with plan of care.  All questions were answered. The patient knows to call the clinic with any problems, questions or concerns. No barriers to learning were detected.  The total time spent in the appointment was 40 minutes and more than 50% was on counseling and review of test results  Ventura Sellers, MD Medical Director of Neuro-Oncology [name] [place] 07/17/18 9:57 AM

## 2018-07-18 ENCOUNTER — Telehealth: Payer: Self-pay

## 2018-07-18 NOTE — Telephone Encounter (Signed)
Per 10/2 no los

## 2018-07-26 ENCOUNTER — Telehealth: Payer: Self-pay | Admitting: *Deleted

## 2018-07-26 ENCOUNTER — Other Ambulatory Visit: Payer: Self-pay | Admitting: Radiation Oncology

## 2018-07-26 DIAGNOSIS — C090 Malignant neoplasm of tonsillar fossa: Secondary | ICD-10-CM

## 2018-07-26 NOTE — Telephone Encounter (Signed)
Oncology Nurse Navigator Documentation  In follow-up to Mr. Stoffers 10/9 e-mail request for referral to [facility] relative to his move, sent e-mail reply indicating he is being referred to [facility].   [name], RN, BSN Head & Neck Oncology Nurse [facility] at [place] [phone]

## 2018-07-29 ENCOUNTER — Encounter: Payer: Self-pay | Admitting: Gastroenterology

## 2018-07-29 ENCOUNTER — Telehealth: Payer: Self-pay | Admitting: *Deleted

## 2018-07-29 NOTE — Telephone Encounter (Signed)
Faxed notes to [facility] in [place], [place]- faxed to [phone], fax went through

## 2018-08-06 ENCOUNTER — Ambulatory Visit: Payer: Self-pay | Admitting: Primary Care

## 2018-08-06 ENCOUNTER — Other Ambulatory Visit: Payer: Self-pay

## 2018-08-06 ENCOUNTER — Encounter: Payer: Self-pay | Admitting: Primary Care

## 2018-08-06 VITALS — BP 130/74 | HR 90 | Temp 98.2°F | Resp 18 | Ht 70.25 in | Wt 156.8 lb

## 2018-08-06 DIAGNOSIS — Z85818 Personal history of malignant neoplasm of other sites of lip, oral cavity, and pharynx: Secondary | ICD-10-CM

## 2018-08-06 DIAGNOSIS — F32A Depression, unspecified: Secondary | ICD-10-CM

## 2018-08-06 DIAGNOSIS — M6281 Muscle weakness (generalized): Secondary | ICD-10-CM

## 2018-08-06 DIAGNOSIS — Z23 Encounter for immunization: Secondary | ICD-10-CM

## 2018-08-06 DIAGNOSIS — G540 Brachial plexus disorders: Secondary | ICD-10-CM

## 2018-08-06 MED ORDER — FLUOXETINE HCL 20 MG PO CAPS *I*
20.0000 mg | ORAL_CAPSULE | Freq: Every day | ORAL | 5 refills | Status: DC
Start: 2018-08-06 — End: 2019-01-27

## 2018-08-06 NOTE — Addendum Note (Signed)
Addended by: Janetta Hora on: 08/06/2018 10:12 AM     Modules accepted: Orders

## 2018-08-06 NOTE — Progress Notes (Signed)
causing chronic pain      Patient ID: James Oneill is a 42 y.o. year old male.    Chief Complaint   Patient presents with    New Patient Visit     Establish     HPI: James Oneill is a new patient to me who has moved back to this area from [place].  He has a complicated history.  2 years ago he presented with left cervical adenopathy.  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  This was treated with radiotherapy.  A year later he had a recurrence necessitating a radical neck dissection.  The radiotherapy has left him with brachial plexopathy causing chronic pain for which he is treated with gabapentin and Duragesic patches.  An attempt to treat and also been made with hyperbaric oxygen which did not help.  He has a painful area in the left lower jaw.  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  He is taking Ritalin to give him energy    He had a cholecystectomy earlier this year.  This was complicated 2 months later with a small bowel obstruction.    He has been experiencing muscle weakness and fasciculation.  His EMG is abnormal.  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    He does admit to having been depressed for the past couple of years.  She does not have any homicidal or suicidal ideation.  He has been reluctant to take any antidepressants because he does not want to feel doped up.    Past medical and surgical history was reviewed with him    Family and social history was reviewed with him.  He is divorced.  He used to manage a country club.  He is an [employer] fast streamer.  He does not smoke.  He rarely drinks alcohol.  He does not use illicit drugs.    Constitutional: Feels chronically fatigued, no weight changes, normal appetite and thirst  Eyes: No visual disturbances  ENT: No earache or sore throat  Cardiovascular: No chest pain, palpitations: Orthopnea or PND  Respiratory: No  coughing or wheezing or exertional dyspnea  Gastrointestinal: No abdominal pain  diarrhea rectal bleeding dysphagia or acid reflux.  Is prone to constipation  Genitourinary: No voiding difficulties: No hematuria  Neurological: As above  No dizziness.  No headache  Skin: No rashes or pruritus or skin lesions  Musculoskeletal: No joint pains or myalgias  Hematological: No enlarged lymph nodes  Psychiatric: No anxiety or depression        The medication and allergy list was reviewed and is accurate  Allergy / Social History / Medications:  No Known Allergies (drug, envir, food or latex)  Social History     Tobacco Use    Smoking status: Never Smoker    Smokeless tobacco: Never Used   Substance Use Topics    Alcohol use: Yes     Alcohol/week: 1.0 standard drinks     Types: 1 Glasses of wine per week     Frequency: 2-4 times a month     Drinks per session: 1 or 2     Binge frequency: Never     Patient's Medications   New Prescriptions    FLUOXETINE (PROZAC) 20 MG CAPSULE    Take 1 capsule (20 mg total) by mouth daily   Previous Medications  BACLOFEN (LIORESAL) 10 MG TABLET    Take 10 mg by mouth 3 times daily    BISACODYL (BISACODYL) 5 MG EC TABLET    Take 5 mg by mouth daily as needed for Constipation    CYCLOBENZAPRINE (FLEXERIL) 10 MG TABLET    Take 10 mg by mouth 3 times daily as needed for Muscle spasms    FENTANYL (DURAGESIC) 12 MCG/HR PATCH    Place 1 patch onto the skin every 72 hours Remove & discard patch after 72 hours.    GABAPENTIN (NEURONTIN) 600 MG TABLET    Take 600 mg by mouth 3 times daily    METHYLPHENIDATE (RITALIN) 5 MG TABLET    Take 5 mg by mouth 2 times daily (before meals)   Modified Medications    No medications on file   Discontinued Medications    No medications on file          Physical Exam:  Vitals:    08/06/18 0914   BP: 130/74   Pulse: 90   Resp: 18   Temp: 36.8 C (98.2 F)   Weight: 71.1 kg (156 lb 12.8 oz)   Height: 1.784 m (5' 10.25")     SpO2 Readings from Last 3 Encounters:    08/06/18 94%      Estimated body mass index is 22.34 kg/m as calculated from the following:    Height as of this encounter: 1.784 m (5' 10.25").    Weight as of this encounter: 71.1 kg (156 lb 12.8 oz).  BP Readings from Last 3 Encounters:   08/06/18 130/74     Wt Readings from Last 3 Encounters:   08/06/18 71.1 kg (156 lb 12.8 oz)     Looks well.  Not in any acute distress  Head-normocephalic  ENT-normal  Eyes-PERRLA.  Fundi normal  Mouth-possible bone protrusion left lower jaw  Neck-woody induration bilaterally.  no adenopathy or thyromegaly  Cardiovascular-heart rate regular.  Heart sounds 1 and 2 with no added sounds no carotid bruits no edema  Respiratory-lungs are clear to palpation percussion and auscultation  Abdomen-is soft without masses or tenderness.  Bowel sounds normal  Neurological-strength is 4.5/5 bilaterally.  Sensation and tone are normal    Recent Lab Results:none      Assessment and Plan:     1.  History of left tonsil cancer  2.  Brachial plexopathy from radiation  3. 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  Muscle weakness and fasciculation  5.  Depression.  Agreeable to trial of fluoxetine  6.  Recommend flu vaccination    Patient is referred to radiation oncology and neurology for further evaluation and management.    Follow up with me in 4 weeks  Orders this visit  Orders Placed This Encounter   Procedures    AMB REFERRAL TO RADIATION ONCOLOGY    AMB REFERRAL TO NEUROLOGY

## 2018-08-07 ENCOUNTER — Other Ambulatory Visit: Payer: Self-pay

## 2018-08-08 ENCOUNTER — Other Ambulatory Visit
Admission: RE | Admit: 2018-08-08 | Discharge: 2018-08-08 | Disposition: A | Discharge status: Home / Self Care | Payer: MEDICAID | Source: Ambulatory Visit | Attending: Pathology | Admitting: Pathology

## 2018-08-08 ENCOUNTER — Other Ambulatory Visit: Payer: Self-pay

## 2018-08-08 DIAGNOSIS — C098 Malignant neoplasm of overlapping sites of tonsil: Secondary | ICD-10-CM | POA: Insufficient documentation

## 2018-08-08 DIAGNOSIS — B977 Papillomavirus as the cause of diseases classified elsewhere: Secondary | ICD-10-CM | POA: Insufficient documentation

## 2018-08-09 ENCOUNTER — Ambulatory Visit: Payer: MEDICAID | Attending: Otolaryngology | Admitting: Otolaryngology

## 2018-08-09 VITALS — BP 135/78 | HR 80 | Temp 97.3°F | Resp 16 | Ht 70.24 in | Wt 155.3 lb

## 2018-08-09 DIAGNOSIS — R1312 Dysphagia, oropharyngeal phase: Secondary | ICD-10-CM | POA: Insufficient documentation

## 2018-08-09 DIAGNOSIS — C109 Malignant neoplasm of oropharynx, unspecified: Secondary | ICD-10-CM | POA: Insufficient documentation

## 2018-08-09 LAB — SURGICAL PATHOLOGY

## 2018-08-14 NOTE — Progress Notes (Signed)
James Oneill is a 42 y.o. male who is seen today for initial consultation in Radiation Oncology with James Oneill.  Patient has relocated from [place] where he completed radiation treatment and presents to establish follow up care.    He moved from [place] to live with his mother because he lost his home.  He is not currently working because of a previous work related back injury.  Has chronic pain in his back and shoulders.  Today c/o neck pain 5/10that starts above his left ear (to a lesser extent, the right side) that radiates down his neck to shoulders. Other sites of pain are left clavicle and below chin/jaw on left.   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  Notes numbness left arm elbow to hand.  Seen at [facility] for motor-neuron disease, possibly ALS or PLS.  Saw James Oneill and was scoped.  Patient states he was told there are no signs of cancer.  Is scheduled for PET scan on 08/23/18.  Is asking for a referral to a neurologist in the area.  Reported to James Oneill.    History Relevant to Visit:    02/2016  Patient noted left neck swelling, had noted a sensation of food getting stuck in his throat.  03/03/16  CT neck = left cervical adenopathy  03/27/16   Left neck mass biopsy = malignant tumor cells, favor squamous cell carcinoma  04/07/16  PET/CT  Solitary level 2 lymph node, symmetric tonsillar hypermetabolic activity  1/61/09   Biopsy bilateral nasopharynx, lateran and medial base of tongue all neg for malignancy     Left tonsil positive for invasive SCC, moderate differentiated, diffusely p16 positive.    05/22/18-07/05/16 RADIATION [facility] [place]   Tonsils and bilateral neck (without chemo)   70Gy in 35 fx to gross disease   63Gy in 35 fx to high risk nodal echelons   56Gy in 35 fx to intermediate risk nodal echelons    03/07/18  CT neck =  No recurrence of disease    05/04/18  Lap chole  05/29/18 follow up with radiation onc  Presented  with decreased manual dexterity, pain radiating to arms, severe left neck pain, hand numbness, decreased left arm strength, left foot drag, calf and face spasms.  This was being worked up.        This is note from PCP:  HPI: James Oneill is a new patient to me who has moved back to this area from [place].  He has a complicated history.  2 years ago he presented with left cervical adenopathy.  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  This was treated with radiotherapy.  A year later he had a recurrence necessitating a radical neck dissection.  The radiotherapy has left him with brachial plexopathy causing chronic pain for which he is treated with gabapentin and Duragesic patches.  An attempt to treat and also been made with hyperbaric oxygen which did not help.  He has a painful area in the left lower jaw.  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  He is taking Ritalin to give him energy    He had a cholecystectomy earlier this year.  This was complicated 2 months later with a small bowel obstruction.      BP 139/75 (BP Location: Left arm, Patient Position:  Sitting, Cuff Size: adult)    Pulse 86    Temp 36.6 C (97.8 F) (Temporal)    Ht 177.8 cm (5\' 10" )    Wt 71.7 kg (158 lb)    BMI 22.67 kg/m     Pain    08/15/18 1318   PainSc:   5   PainLoc: Neck       Karnofsky Performance Status: 80% - Normal activity with effort, some signs or symptoms of disease    Fatigue: 5 - Moderate fatigue    Safety/Protective Mechanisms  Two Patient Identifiers Confirmed?: Yes    Fall Risk  RN assessed patient for risk of falls: Yes  Criteria for Risk of Falls: (!) History of falls in the last 3 months  Patient at Risk for Falls: (!) Yes  Patient is able to comprehend/follow instructions: Yes  Fall Risk Interventions: Patient assisted on/off exam tables      Past Medical History  Past Medical History:   Diagnosis Date    Cancer      Depression     MVP (mitral valve prolapse)        Past Surgical History:  Past Surgical History:   Procedure Laterality Date    ELBOW FRACTURE SURGERY      GALLBLADDER SURGERY  2019    HERNIA REPAIR  [id number]    umbilical    NECK SURGERY Left 2018    radical dissection    TONSILLECTOMY  2017    SCC Left tonsil       Medications:  Current Outpatient Medications on File Prior to Visit   Medication Sig Dispense Refill    fentaNYL (DURAGESIC) 12 MCG/HR patch Place 1 patch onto the skin every 72 hours Remove & discard patch after 72 hours.      methylphenidate (RITALIN) 5 MG tablet Take 5 mg by mouth 2 times daily (before meals)      baclofen (LIORESAL) 10 MG tablet Take 10 mg by mouth 3 times daily      cyclobenzaprine (FLEXERIL) 10 MG tablet Take 10 mg by mouth 3 times daily as needed for Muscle spasms      gabapentin (NEURONTIN) 600 MG tablet Take 600 mg by mouth 3 times daily      bisacodyl (BISACODYL) 5 MG EC tablet Take 5 mg by mouth daily as needed for Constipation      FLUoxetine (PROZAC) 20 MG capsule Take 1 capsule (20 mg total) by mouth daily 30 capsule 5     No current facility-administered medications on file prior to visit.        Allergies:  No Known Allergies (drug, envir, food or latex)    Family History:  Family History   Problem Relation Age of Onset    Cancer Father     Dementia Maternal Grandmother        Social History:  Social History     Socioeconomic History    Marital status: Divorced     Spouse name: Not on file    Number of children: Not on file    Years of education: Not on file    Highest education level: Not on file   Tobacco Use    Smoking status: Never Smoker    Smokeless tobacco: Never Used   Substance and Sexual Activity    Alcohol use: Yes     Alcohol/week: 1.0 standard drinks     Types: 1 Glasses of wine per week     Frequency: 2-4 times a month  Drinks per session: 1 or 2     Binge frequency: Never    Drug use: Yes    Sexual activity: Not Currently   Other  Topics Concern    Not on file   Social History Narrative    Not on file       Review of Systems  As per HPI  ROS   I have reviewed, confirmed, and made changes as appropriate.  Details include:    Review of Systems      Constitutional    [+] Malaise/Fatigue (has considerable fatigue, takes Ritalin to manage) /   Weakness     [-] Fever / Chills / Weight loss / Diaphoresis    Skin    [+] Rash (dry skin on scrotum)     Comments: No Lupus or scleroderma     HENT    [+] Headaches (originate behind left ear and he feels the pain is behind   left eye.  2 times a week) / Hearing loss (mild) / Sore throat (frequent)       [-] Congestion    Eyes    [+] Blurred vision     [-] Double vision    Cardiovascular    [+] Palpitations (occasionally, attributes to MVP)     [-] Chest pain     Comments: No Pacemaker, has MVP   Respiratory    [-] Cough / Shortness of breath / Wheezing    Gastrointestinal    [+] Heartburn (GERD) / Diarrhea / Constipation     [-] Nausea / Abdominal pain / Vomiting    Genitourinary    [-] Dysuria / Urgency / Frequency    Musculoskeletal    [+] Neck pain / Back pain (chronic low pain r/t injury) / Falls     [-] Joint pain    Endo/Heme/Aller    [-] Easy bruising/bleeding     Comments: No diabetes, no thyroid problems.  No anemia.       Neurological    [+] Tingling (left fingers) / Sensory change (numbness lower left arm and   fingers) / Focal weakness (lower left arm) / Dizziness (occasionally)    Psychological    [+] Depression     [-] Nervous/Anxious / Suicidal ideas    Edited by:    Ciro Backer Thu Aug 15, 2018  1:48 PM       Information significant for Radiation Therapy  Previous Radiation Therapy: yes  Implanted Cardiac Device: no   Implanted insulin pump:no      PLAN:    Kathrynn Ducking, RN  08/15/2018

## 2018-08-14 NOTE — H&P (Signed)
Radiation Oncology CONSULTATION (08/16/2018)    Patient Identifier:  James Oneill is a 42 y.o. male with cT1N1 left tonsillar cancer. S/p definitve RT in 2017, recurrence in 2018, underwent neck dissection.    Diagnosis (including stage):  Cancer Staging  Tonsillar cancer  Staging form: Pharynx - HPV-Mediated Oropharynx, AJCC 8th Edition  - Clinical stage from 08/16/2018: Stage I (cT1, cN1, cM0, p16+) - Signed by James Oneill, MBBS on 08/16/2018    Oncologic History:   The patient is a 42 y.o. year old male    Mar 03, 2016: CT scan showed left jugulodigastric lymphadenopathy.    02/25/2018: CT neck showed no evidence of recurrence.  (And sternocleidomastoid atrophy after interval left neck dissection.    04/07/2018: PET-CT showed Solitary left level II lymph node is enlarged and hypermetabolic.  No primary or distant metastasis was identified.    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     04/2016: definitve RT alone to left tonsil and bilateral neck 70 Gy with Dr. Isidore Moos at [facility].     07/03/2017: neck recurrence, had left modified radical neck dissection. Path reported level IIA 1/19 LN positive with ECE, level 2B 0/17 LN positive. He did not receive any cancer therapy after surgery.    History of Present Illness:    The patient reports pain starting 3 months after radiation therapy. He was seen at [facility] previously for possible neuromuscular disease. Had calf biopsy which was normal. But EMG showed abnormality. Pain Radiating to his arms.  He also reported  severe pain radiating to his left neck and left ear. His left arm has limited movement, can not raise his arm above head. The energy level is fair.  There is no odynophagia or dysphagia.  He was scoped by Dr. Sabra Heck yesterday and looked normal.     Radiation Considerations:   Previous radiation: Yes to head and neck in 2017.  Pacemaker/defibrillator: no  Insulin Pump: no  Difficulty lay flat on the back: no  Diagnosis of  connective tissue disorder: no    Past Medical History  Past Medical History:   Diagnosis Date    Cancer     Depression     MVP (mitral valve prolapse)        Past Surgical History  Past Surgical History:   Procedure Laterality Date    ELBOW FRACTURE SURGERY      GALLBLADDER SURGERY  2019    HERNIA REPAIR  0347    umbilical    NECK SURGERY Left 2018    radical dissection    TONSILLECTOMY  2017    SCC Left tonsil       Medications  Current Outpatient Medications   Medication    fentaNYL (DURAGESIC) 12 MCG/HR patch    methylphenidate (RITALIN) 5 MG tablet    baclofen (LIORESAL) 10 MG tablet    cyclobenzaprine (FLEXERIL) 10 MG tablet    gabapentin (NEURONTIN) 600 MG tablet    bisacodyl (BISACODYL) 5 MG EC tablet    FLUoxetine (PROZAC) 20 MG capsule     No current facility-administered medications for this visit.        Allergies  No Known Allergies (drug, envir, food or latex)    Family History  Family History   Problem Relation Age of Onset    Cancer Father     Dementia Maternal Grandmother        Social History  Social History     Socioeconomic History  Marital status: Divorced     Spouse name: Not on file    Number of children: Not on file    Years of education: Not on file    Highest education level: Not on file   Tobacco Use    Smoking status: Never Smoker    Smokeless tobacco: Never Used   Substance and Sexual Activity    Alcohol use: Yes     Alcohol/week: 1.0 standard drinks     Types: 1 Glasses of wine per week     Frequency: 2-4 times a month     Drinks per session: 1 or 2     Binge frequency: Never    Drug use: Yes    Sexual activity: Not Currently   Other Topics Concern    Not on file   Social History Narrative    Not on file       Review of Systems: As per history of present illness.  ROS  I have reviewed, confirmed, and made changes as appropriate.  Details include:    Review of Systems      Constitutional    [+] Malaise/Fatigue (has considerable fatigue, takes Ritalin to  manage) /   Weakness     [-] Fever / Chills / Weight loss / Diaphoresis    Skin    [+] Rash (dry skin on scrotum)     Comments: No Lupus or scleroderma     HENT    [+] Headaches (originate behind left ear and he feels the pain is behind   left eye.  2 times a week) / Hearing loss (mild) / Sore throat (frequent)       [-] Congestion    Eyes    [+] Blurred vision     [-] Double vision    Cardiovascular    [+] Palpitations (occasionally, attributes to MVP)     [-] Chest pain     Comments: No Pacemaker, has MVP   Respiratory    [-] Cough / Shortness of breath / Wheezing    Gastrointestinal    [+] Heartburn (GERD) / Diarrhea / Constipation     [-] Nausea / Abdominal pain / Vomiting    Genitourinary    [-] Dysuria / Urgency / Frequency    Musculoskeletal    [+] Neck pain / Back pain (chronic low pain r/t injury) / Falls     [-] Joint pain    Endo/Heme/Aller    [-] Easy bruising/bleeding     Comments: No diabetes, no thyroid problems.  No anemia.       Neurological    [+] Tingling (left fingers) / Sensory change (numbness lower left arm and   fingers) / Focal weakness (lower left arm) / Dizziness (occasionally)    Psychological    [+] Depression     [-] Nervous/Anxious / Suicidal ideas    Edited by:    Ciro Backer Thu Aug 15, 2018  1:48 PM     Physical Examination  BP 139/75 (BP Location: Left arm, Patient Position: Sitting, Cuff Size: adult)    Pulse 86    Temp 36.6 C (97.8 F) (Temporal)    Ht 177.8 cm (5\' 10" )    Wt 71.7 kg (158 lb)    BMI 22.67 kg/m     Pain    08/15/18 1318   PainSc:   5   PainLoc: Neck       KPS: 50    Physical Exam  Constitutional:  Appearance: Normal appearance.   HENT:      Head: Normocephalic and atraumatic.      Right Ear: Tympanic membrane and external ear normal.      Left Ear: Tympanic membrane and external ear normal.      Nose: Nose normal.      Mouth/Throat:      Mouth: Mucous membranes are moist.      Pharynx: Oropharynx is clear.   Eyes:      Conjunctiva/sclera:  Conjunctivae normal.   Neck:      Musculoskeletal: Neck supple.      Comments: No lymphadenopathy.  Cardiovascular:      Rate and Rhythm: Normal rate and regular rhythm.   Pulmonary:      Effort: Pulmonary effort is normal.      Breath sounds: Normal breath sounds.   Abdominal:      General: Abdomen is flat. Bowel sounds are normal.      Palpations: Abdomen is soft.   Musculoskeletal:         General: Tenderness present.      Comments: Bilateral arm has decreased ROM, especially left arm can only abduct 75 degree.    Skin:     General: Skin is warm and dry.   Neurological:      General: No focal deficit present.      Mental Status: He is alert and oriented to person, place, and time.      Comments: Bilateral UE and LE strength 5/5, might be slightly weaker in his left arm.    Psychiatric:         Mood and Affect: Mood normal.         Behavior: Behavior normal.         Thought Content: Thought content normal.         Judgment: Judgment normal.       Oral cavity has mucous membranes.  There is no deviation of the tongue.    Diagnostic and Laboratory Data Reviewed:  See subjective above. Both radiologic images and reports were personally reviewed and discussed with James Oneill and family.      IMPRESSION  DORMAN CALDERWOOD is a 42 y.o. male with a history of Head and neck cancer, completed RT, had recurrence, treated with neck dissection.  So far, no evidence of tumor recurrence. Patient suffers from arm/shoulder and neck pain starting 3 months post RT. Likely resulting from brachial plexopathy.     DISCUSSION  After interviewing and examining the patient we enjoyed a 60-minute face-to-face discussion with James Oneill, 95% of which was for counseling and coordination of care. We carefully reviewed the events leading up to the diagnosis as well as the management to date, radiographic findings and the pathologic data.     Patient's arm/shoulder pain may caused by RT induced brachial plexopathy, neuromuscular disease,  tumor recurrence. A PET-CT is scheduled for 11/8.    He told me Dr. Sabra Heck recommended a neurologist to him and he will make an appointment.    We discussed possibly physical therapy could help him regain arm strength. He would like to see neurologist before seeing PT.    --PET-CT scheduled for 11/8  --Neurology  --physical therapy  --RTC in 3 months.    Patient knows to call with questions or concerns.

## 2018-08-15 ENCOUNTER — Ambulatory Visit: Payer: 59 | Attending: Radiation Oncology | Admitting: Radiation Oncology

## 2018-08-15 ENCOUNTER — Encounter: Payer: Self-pay | Admitting: Radiation Oncology

## 2018-08-15 VITALS — BP 139/75 | HR 86 | Temp 97.8°F | Ht 70.0 in | Wt 158.0 lb

## 2018-08-15 DIAGNOSIS — C099 Malignant neoplasm of tonsil, unspecified: Secondary | ICD-10-CM

## 2018-08-16 DIAGNOSIS — C099 Malignant neoplasm of tonsil, unspecified: Secondary | ICD-10-CM | POA: Insufficient documentation

## 2018-08-23 ENCOUNTER — Ambulatory Visit
Admission: RE | Admit: 2018-08-23 | Discharge: 2018-08-23 | Disposition: A | Discharge status: Home / Self Care | Payer: MEDICAID | Source: Ambulatory Visit | Attending: Radiology | Admitting: Radiology

## 2018-08-23 DIAGNOSIS — Z9049 Acquired absence of other specified parts of digestive tract: Secondary | ICD-10-CM | POA: Insufficient documentation

## 2018-08-23 DIAGNOSIS — R1312 Dysphagia, oropharyngeal phase: Secondary | ICD-10-CM

## 2018-08-23 DIAGNOSIS — Z08 Encounter for follow-up examination after completed treatment for malignant neoplasm: Secondary | ICD-10-CM | POA: Insufficient documentation

## 2018-08-23 DIAGNOSIS — K5790 Diverticulosis of intestine, part unspecified, without perforation or abscess without bleeding: Secondary | ICD-10-CM | POA: Insufficient documentation

## 2018-08-23 DIAGNOSIS — C109 Malignant neoplasm of oropharynx, unspecified: Secondary | ICD-10-CM

## 2018-08-23 DIAGNOSIS — C098 Malignant neoplasm of overlapping sites of tonsil: Secondary | ICD-10-CM | POA: Insufficient documentation

## 2018-08-23 LAB — POCT GLUCOSE: Glucose POCT: 73 mg/dL (ref 60–99)

## 2018-08-23 MED ORDER — BARIUM SULFATE (REDI-CAT) 2 % PO SUSP *I*
450.0000 mL | Freq: Once | ORAL | Status: AC
Start: 2018-08-23 — End: 2018-08-23
  Administered 2018-08-23: 450 mL via ORAL

## 2018-08-23 MED ORDER — FLUORODEOXYGLUCOSE F-18 FDG IV *I*
11.0700 | Freq: Once | INTRAVENOUS | Status: AC | PRN
Start: 2018-08-23 — End: 2018-08-23
  Administered 2018-08-23: 11.07 via INTRAVENOUS

## 2018-08-26 ENCOUNTER — Ambulatory Visit: Payer: MEDICAID | Attending: Otolaryngology

## 2018-08-26 DIAGNOSIS — R1312 Dysphagia, oropharyngeal phase: Secondary | ICD-10-CM | POA: Insufficient documentation

## 2018-08-26 DIAGNOSIS — C099 Malignant neoplasm of tonsil, unspecified: Secondary | ICD-10-CM

## 2018-08-27 ENCOUNTER — Telehealth: Payer: Self-pay | Admitting: Otolaryngology

## 2018-08-28 ENCOUNTER — Telehealth: Payer: Self-pay | Admitting: Otolaryngology

## 2018-08-28 DIAGNOSIS — R52 Pain, unspecified: Secondary | ICD-10-CM

## 2018-08-29 NOTE — Progress Notes (Signed)
[facility] CONSULT     Patient Name: James Oneill  Date of Exam: 08/26/18  Date of Birth: 19-Mar-1976  Patient MRN: [id number]  Referring Physician: Dr. Sabra Heck  Primary Diagnosis:  SCC of left tonsil  Treatment Diagnosis: oropharyngeal dysphagia    Onset Date: 2017  Start of Care Date: 08/26/18    [name] is a very pleasant 42 y.o. male who was referred to the Department of Speech Pathology at [facility] on for a clinical swallow evaluation.  Patient agreeable to clinical swallow evaluation and education about potential impact of chemoradiation therapy on swallow function.     PAST MEDICAL HISTORY   Please see electronic medical record for complete past medical history. Per chart, patient with history of cT1N1 left tonsillar cancer, s/p definitve RT in 2017, recurrence in 2018, underwent neck dissection. Of notes, prior to moving back [place] the [place] undergoing aworkup for a constellation of neurologic symptoms. 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     Per patient, he noted xerostomia and experiences dysphagia primarily with solids such a bread and almonds. He does endorse occasional coughing with thins as well. He reports having a modified barium swallow in [place] about a year ago, during which a barium tablet "got stuck" in his throat though does not recall if there was any documented penetration aspiration. Regarding his neurologic symptoms, he reports LUE numbness and left neck "fasciculations" and cramping post radiation. This was initially thought to be radiation induced brachial plexopathy, however, symptoms progress to intermittent RUE numbness, lingual fasciculations, cervical dystonia, slurred speech and confusion/word finding difficulty.     ORAL MECHANISM ASSESSMENT   Face: symmetry at rest and during movement, noted slight fasciculations of left chin  Lips: adequate range of motion, and  strength  Tongue: adequate protrusion, retraction, elevation, depression, lateralization, and strength to resistance, slight lingual fasciculations   Palate:  Symmetrical rise with phonation, no nasal emissions during articulation, patient denies nasal regurgitation  Oral opening: 35 mm  Anterior neck palpation: mild bilateral fibrosis, able to mobilize hyolaryngeal complex     SWALLOW ASSESSMENT  PO trials of thins water and cracker presented.  Patient with adequate labial seal with no oral loss of bolus.  Patient was judged to have adequate bolus manipulation without significant oral residue.  Patient was judged to have timely swallow initiation.  Patient with present and adequate hyolaryngeal elevation and excursion.  Patient with no overt signs or symptoms of aspiration or penetration.       Educated patient about swallow anatomy and physiology with support of anatomical model as well as nature of latent radiation associated fibrosis. Reviewed role of routine exercise in maintenance of swallow function.     Introduced to maintenance exercise program which included: effortful swallow, Masako, and Mendelsohn.     After initial modeling of exercises by the clinician, pt was able to complete each exercise to prescribed frequency and duration. Written instructions for each exercise provided for home carryover.    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 It is unclear at this time if patient's neurologic symptoms are playing an additional role in patient dysphagia symptoms. Would recommend an instrumental swallow study to assess nature of dysphagia to best determine safest diet recommendations as well as potential interventions. He was educated on nature of  latent radiation associated dysphagia and maintenance swallow exercise program was introduced. Will  request order for MBSS for further evaluate.     RECOMMENDATIONS   1. Recommend patient continue with current regular diet with thin liquids.   2. Patient will complete oropharyngeal exercise routine to prescribed frequency and duration daily.  3. Provided patient with contact information and encouraged patient to contact with any questions or concerns.     SHORT TERM GOALS  1.  Patient will tolerate least restrictive diet with minimized risk of aspiration/penetration.  2.  Patient will follow swallow precautions at least 90% of the time.  3.  Patient will complete oropharyngeal exercise routine three times daily.    LONG TERM GOALS   1.  Patient will tolerate least restrictive diet with minimized risk of aspiration/penetration.    Frequency/duration: Patient will be seen 1x per week until long term goals have been met.    Department of Speech Pathology    PLAN OF CARE    Physician:  Jacques Navy, MD    I have reviewed your initial evaluation and agree with the documented goals and Plan of Care      08/26/2018

## 2018-08-30 ENCOUNTER — Telehealth: Payer: Self-pay | Admitting: Student in an Organized Health Care Education/Training Program

## 2018-08-30 ENCOUNTER — Inpatient Hospital Stay: Admission: RE | Admit: 2018-08-30 | Payer: Self-pay | Source: Ambulatory Visit | Admitting: Radiation Oncology

## 2018-08-30 DIAGNOSIS — R1310 Dysphagia, unspecified: Secondary | ICD-10-CM

## 2018-08-30 NOTE — Telephone Encounter (Signed)
Referral placed to neuro medicine pain clinic.

## 2018-08-30 NOTE — Telephone Encounter (Addendum)
MBSS ordered    ----- Message from Caleb Popp, RN sent at 08/30/2018 11:36 AM EST -----  Please place an order for a barium swallow, thanks  ----- Message -----  From: Frances Nickels, CCC-SLP  Sent: 08/29/2018   3:33 PM EST  To: Jacques Navy, MD, Caleb Popp, RN    May we have a modified barium swallow study order for this patient? Thanks. Laurey Arrow

## 2018-08-30 NOTE — Telephone Encounter (Signed)
-----   Message from Caleb Popp, RN sent at 08/30/2018 11:36 AM EST -----  Please place an order for a barium swallow, thanks  ----- Message -----  From: Frances Nickels, CCC-SLP  Sent: 08/29/2018   3:33 PM EST  To: Jacques Navy, MD, Caleb Popp, RN    May we have a modified barium swallow study order for this patient? Thanks. Laurey Arrow

## 2018-09-02 ENCOUNTER — Ambulatory Visit: Payer: 59 | Admitting: Primary Care

## 2018-09-02 ENCOUNTER — Encounter: Payer: Self-pay | Admitting: Primary Care

## 2018-09-02 VITALS — BP 128/74 | HR 68 | Temp 98.1°F | Ht 70.0 in | Wt 161.0 lb

## 2018-09-02 DIAGNOSIS — M5441 Lumbago with sciatica, right side: Secondary | ICD-10-CM

## 2018-09-02 DIAGNOSIS — F32A Depression, unspecified: Secondary | ICD-10-CM

## 2018-09-02 DIAGNOSIS — G8929 Other chronic pain: Secondary | ICD-10-CM

## 2018-09-02 DIAGNOSIS — G1220 Motor neuron disease, unspecified: Secondary | ICD-10-CM

## 2018-09-02 DIAGNOSIS — G540 Brachial plexus disorders: Secondary | ICD-10-CM

## 2018-09-02 MED ORDER — GABAPENTIN 600 MG PO TABLET *I*
600.0000 mg | ORAL_TABLET | Freq: Three times a day (TID) | ORAL | 2 refills | Status: DC
Start: 2018-09-02 — End: 2019-03-11

## 2018-09-02 MED ORDER — METHYLPHENIDATE HCL 5 MG PO TABS *I*
5.0000 mg | ORAL_TABLET | Freq: Two times a day (BID) | ORAL | 0 refills | Status: DC
Start: 2018-09-02 — End: 2018-10-23

## 2018-09-02 NOTE — Progress Notes (Signed)
Patient ID: James Oneill is a 42 y.o. year old male.    Chief Complaint   Patient presents with    Follow-up     4 week f/u review tests     HPI: James Oneill is here today for reevaluation of his depression.  He is feeling much better on the fluoxetine.  He is much less anxious.  He does not feel sad.  She does not have any homicidal or suicidal ideation.  He does require referral to neurology.  He had a tentative diagnosis of either ALS or myasthenia gravis.  He reports muscle weakness and fasciculations in all 4 extremities worse towards the end of the day.  His symptoms started at least 6 months ago  He also has chronic right low back pain from an injury back in May.  The pain radiates into the right lower extremity.  He has been to physical therapy with no significant improvement.  Although he sustained this injury at work workers comp declined it        The medication and allergy list was reviewed and is accurate  Allergy / Social History / Medications:  No Known Allergies (drug, envir, food or latex)  Social History     Tobacco Use    Smoking status: Never Smoker    Smokeless tobacco: Never Used   Substance Use Topics    Alcohol use: Yes     Alcohol/week: 1.0 standard drinks     Types: 1 Glasses of wine per week     Frequency: 2-4 times a month     Drinks per session: 1 or 2     Binge frequency: Never     Patient's Medications   New Prescriptions    No medications on file   Previous Medications    BACLOFEN (LIORESAL) 10 MG TABLET    Take 10 mg by mouth 3 times daily    BISACODYL (BISACODYL) 5 MG EC TABLET    Take 5 mg by mouth daily as needed for Constipation    CYCLOBENZAPRINE (FLEXERIL) 10 MG TABLET    Take 10 mg by mouth 3 times daily as needed for Muscle spasms    FENTANYL (DURAGESIC) 12 MCG/HR PATCH    Place 1 patch onto the skin every 72 hours Remove & discard patch after 72 hours.    FLUOXETINE (PROZAC) 20 MG CAPSULE    Take 1 capsule (20 mg total) by mouth daily    GABAPENTIN (NEURONTIN) 600 MG  TABLET    Take 600 mg by mouth 3 times daily    METHYLPHENIDATE (RITALIN) 5 MG TABLET    Take 5 mg by mouth 2 times daily (before meals)   Modified Medications    No medications on file   Discontinued Medications    No medications on file          Physical Exam:  Vitals:    09/02/18 0907   BP: 128/74   Pulse: 68   Temp: 36.7 C (98.1 F)   Weight: 73 kg (161 lb)   Height: 1.778 m (5\' 10" )     SpO2 Readings from Last 3 Encounters:   09/02/18 98%   08/09/18 98%   08/06/18 94%      Estimated body mass index is 23.1 kg/m as calculated from the following:    Height as of this encounter: 1.778 m (5\' 10" ).    Weight as of this encounter: 73 kg (161 lb).  BP Readings from Last 3 Encounters:  09/02/18 128/74   08/15/18 139/75   08/09/18 135/78     Wt Readings from Last 3 Encounters:   09/02/18 73 kg (161 lb)   08/23/18 71.4 kg (157 lb 6.5 oz)   08/15/18 71.7 kg (158 lb)     Looks well.  Not in any acute distress  Neck-supple no adenopathy or thyromegaly  Cardiovascular-heart rate regular.  Heart sounds 1 and 2 with no added sounds no carotid bruits no edema  Respiratory-lungs are clear to palpation percussion and auscultation  Neurological-sensation tone and power grossly normal and symmetrical    Recent Lab Results:none      Assessment and Plan:     1.  Improved anxiety/depression.  Continue with fluoxetine 20 mg  2.  Muscle weakness and fasciculations.  Referral to neurology at [facility]  3. chronic low back pain with right-sided sciatica.  Referral to pain clinic    Orders this visit  No orders of the defined types were placed in this encounter.

## 2018-09-02 NOTE — Addendum Note (Signed)
Addended by: Gerri Spore on: 09/02/2018 09:41 AM     Modules accepted: Level of Service

## 2018-09-03 ENCOUNTER — Telehealth: Payer: Self-pay

## 2018-09-03 NOTE — Telephone Encounter (Signed)
Left the patient a message to call the office to schedule a NPV/EMG/Possible ALS   [facility].

## 2018-09-06 ENCOUNTER — Telehealth: Payer: Self-pay

## 2018-09-06 ENCOUNTER — Other Ambulatory Visit: Payer: Self-pay | Admitting: Student in an Organized Health Care Education/Training Program

## 2018-09-06 DIAGNOSIS — C099 Malignant neoplasm of tonsil, unspecified: Secondary | ICD-10-CM

## 2018-09-06 NOTE — Telephone Encounter (Signed)
Spoke to staff at [facility] and clarified Pt needs a review of his referral.  New referral is needed, request sent to [facility].

## 2018-09-09 ENCOUNTER — Telehealth: Payer: Self-pay

## 2018-09-09 NOTE — Telephone Encounter (Signed)
Call to James Oneill to get an up to date insurance because his [employer] expired on 08/15/18. He states that he spoke with James Oneill and it is changing to COBRA so he had to send in a election letter and a payment and it would take about 10 business days to process. He states that the policy number will remain the same as his [place] subscriber ID.     Call to [employer] to verify what James Oneill said. The representative states that she cannot see if his plan is in the process of being COBRAed, she can only see when it has officially went through. As of right now his plan is still not COBRA call ref #[id number].

## 2018-09-13 ENCOUNTER — Other Ambulatory Visit: Payer: Self-pay

## 2018-09-13 ENCOUNTER — Encounter: Payer: Self-pay | Admitting: Primary Care

## 2018-09-13 ENCOUNTER — Ambulatory Visit: Payer: Self-pay | Admitting: Primary Care

## 2018-09-13 ENCOUNTER — Telehealth: Payer: Self-pay | Admitting: Primary Care

## 2018-09-13 VITALS — BP 130/80 | HR 82 | Temp 98.0°F | Wt 161.0 lb

## 2018-09-13 DIAGNOSIS — M545 Low back pain, unspecified: Secondary | ICD-10-CM

## 2018-09-13 DIAGNOSIS — R0781 Pleurodynia: Secondary | ICD-10-CM

## 2018-09-13 LAB — POCT CLINITEK URINALYSIS
Bilirubin, Clinitek POCT UA: NEGATIVE
Glucose, Clinitek POCT UA: NEGATIVE mg/dL
Ketone, Clinitek POCT UA: NEGATIVE mg/dL
Leukocytes, Clinitek POCT UA: NEGATIVE
Lot #: 810026
Nitrite, Clinitek POCT UA: NEGATIVE
Protein, Clinitek POCT UA: NEGATIVE mg/dL
Specific Gravity, Clinitek POCT UA: 1.015 (ref 1.001–1.035)
Urobilinogen, Clinitek POCT UA: 0.2 E.u./dL (ref 0.2–1.0)
pH, Clinitek POCT US: 7 (ref 4.6–8.0)

## 2018-09-13 NOTE — Telephone Encounter (Signed)
Spoke to pt, he states that he has had some tenderness and pain on the right posterior side of his ribs.  Denies dysuria, normal BMs.  Concerns with last hospitalization he had issues with his Bili?      Recommendations?  Would you like to see pt?

## 2018-09-13 NOTE — Progress Notes (Signed)
Patient ID: James Oneill is a 42 y.o. year old male.    Chief Complaint   Patient presents with    Other     lower back pain     HPI: James Oneill is here today complaining of pain in the right lower chest wall.  This started about 6 days ago.  It is getting progressively worse.  No history of any injury.  The area is tender to the touch.  It is aggravated by deep inspiration pushing or pulling.  He does not have any rash.  No fever or chills  No dysuria no hematuria        The medication and allergy list was reviewed and is accurate  Allergy / Social History / Medications:  No Known Allergies (drug, envir, food or latex)  Social History     Tobacco Use    Smoking status: Never Smoker    Smokeless tobacco: Never Used   Substance Use Topics    Alcohol use: Yes     Alcohol/week: 1.0 standard drinks     Types: 1 Glasses of wine per week     Frequency: 2-4 times a month     Drinks per session: 1 or 2     Binge frequency: Never     Patient's Medications   New Prescriptions    No medications on file   Previous Medications    BISACODYL (BISACODYL) 5 MG EC TABLET    Take 5 mg by mouth daily as needed for Constipation    CYCLOBENZAPRINE (FLEXERIL) 10 MG TABLET    Take 10 mg by mouth 3 times daily as needed for Muscle spasms    FENTANYL (DURAGESIC) 12 MCG/HR PATCH    Place 1 patch onto the skin every 72 hours Remove & discard patch after 72 hours.    FLUOXETINE (PROZAC) 20 MG CAPSULE    Take 1 capsule (20 mg total) by mouth daily    GABAPENTIN (NEURONTIN) 600 MG TABLET    Take 1 tablet (600 mg total) by mouth 3 times daily    METHYLPHENIDATE (RITALIN) 5 MG TABLET    Take 1 tablet (5 mg total) by mouth 2 times daily (before meals) Max daily dose: 10 mg   Modified Medications    No medications on file   Discontinued Medications    No medications on file          Physical Exam:  Vitals:    09/13/18 1426   BP: 130/80   Pulse: 82   Temp: 36.7 C (98 F)   Weight: 73 kg (161 lb)     SpO2 Readings from Last 3 Encounters:    09/13/18 98%   09/02/18 98%   08/09/18 98%      Estimated body mass index is 23.1 kg/m as calculated from the following:    Height as of 09/02/18: 1.778 m (5\' 10" ).    Weight as of this encounter: 73 kg (161 lb).  BP Readings from Last 3 Encounters:   09/13/18 130/80   09/02/18 128/74   08/15/18 139/75     Wt Readings from Last 3 Encounters:   09/13/18 73 kg (161 lb)   09/02/18 73 kg (161 lb)   08/23/18 71.4 kg (157 lb 6.5 oz)     Looks well.  Not in any acute distress  Neck-supple no adenopathy or thyromegaly  Cardiovascular-heart rate regular.  Heart sounds 1 and 2 with no added sounds no carotid bruits no edema  Respiratory-lungs are clear to  palpation percussion and auscultation.  No rash or erythema.  Exquisitely tender over the anterior aspect of the 11th and 12th ribs  Abdomen is soft without any masses or tenderness    Recent Lab Results:none      Assessment and Plan:     Right lower rib pain.  Reevaluate further with chest x-ray and right rib series.  Recommend acetaminophen as needed for pain.  Avoid pressure on the area.  Notify me if he develops any rash or skin lesions in the area  Orders this visit  Orders Placed This Encounter   Procedures    *Chest standard frontal and lateral views    * Ribs RIGHT standard obliques with frontal chest    POCT Clinitek Urinalysis

## 2018-09-13 NOTE — Telephone Encounter (Signed)
Patient has had a right sided pain for over a week - tender to press on - wants to be seen today - please triage and advise scheduling staff

## 2018-09-13 NOTE — Telephone Encounter (Signed)
OK 

## 2018-09-13 NOTE — Telephone Encounter (Signed)
Per Dr. Alison Murray, he would like to see him today at 15.  Pt is aware, can you please add pt to the schedule?

## 2018-09-14 ENCOUNTER — Other Ambulatory Visit: Payer: Self-pay | Admitting: Primary Care

## 2018-09-16 ENCOUNTER — Ambulatory Visit: Payer: 59

## 2018-09-16 ENCOUNTER — Ambulatory Visit: Payer: Self-pay | Admitting: Speech-Language Pathologist

## 2018-09-17 ENCOUNTER — Telehealth: Payer: Self-pay | Admitting: Pain Medicine

## 2018-09-17 NOTE — Telephone Encounter (Signed)
LM for pt to call back and schedule NPV with Dr. Villarreal

## 2018-09-17 NOTE — Telephone Encounter (Signed)
Schedule with Dr. Villarreal    If WC greater than 1 year. Can offer to be schedule in CS with Dr. Eidelman. If there is difficulty finding an appointment time at 2180 the patient can be offered [place]    If WC, Make sure patient is aware that we do not manage the WC case and there will be a need for an ongoing WC MD to manage work status and ratings as we only provide pain management options.  Dr. Eidelman does take WC at this time.    if Unavailable in erecord.  Call for imaging MRI, CT, or x-rays related to pain Please have uploaded to powershare and reports faxed. Let patient know if missing information is unavailable at the time of the appointment, a pain plan may not be formulated the day of appointment.  The patient can bring to the appointment.

## 2018-09-20 ENCOUNTER — Telehealth: Payer: Self-pay | Admitting: Primary Care

## 2018-09-20 NOTE — Telephone Encounter (Signed)
Resulted

## 2018-09-20 NOTE — Telephone Encounter (Signed)
Patient wants results of recent xrays

## 2018-09-20 NOTE — Telephone Encounter (Signed)
Patient notified of stated.

## 2018-09-20 NOTE — Telephone Encounter (Signed)
Chest x-ray and ribs were all normal

## 2018-09-26 NOTE — Progress Notes (Signed)
Dear Colleagues,       I had the pleasure of seeing your patient, James Oneill today. James Oneill is a very pleasant 42 y.o. 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 He underwent definitive radiation therapy followed by salvage left neck dissection in September 2017 and September 2018, respectively. He recently moved to [place] to establish care. James Oneill reports that he did not receive chemotherapy. Hiscurrent symptoms include chronic moderate sore throat and occasional choking with solid foods over the past 12 months. He denies any associated neck masses or odynophagia. He has had no hemoptysis or otalgia. He never did complete any post-treatment imaging that he can recall. His weight has been relativcely stable over the past several months. HE does have chronic neck cramping and stiffness since his treatment and has been diagnosed with a brachial pexopathy.     James Oneill  has a past medical history of Cancer, Depression, and MVP (mitral valve prolapse).   James Oneill  has a past surgical history that includes hernia repair (2019); Gallbladder surgery (2019); Tonsillectomy (2017); Neck surgery (Left, 2018); and Elbow fracture surgery.   James Oneill  reports that he has never smoked. He has never used smokeless tobacco. He reports current alcohol use of about 1.0 standard drinks of alcohol per week. He reports current drug use.   James Oneill family history includes Cancer in his father; Dementia in his maternal grandmother.     A review of systems including constitutional, GI, eyes, endocrine, lymphatic, allergies, respiratory, cardiovascular, musculoskeletal, and neurologic issues was performed. Pertinent positive findings include dyspnea on exertion. Constipation, and left neck and arm pain. All other systems were negative.     A complete and comprehensive otolaryngologic examination including the ears, nose, mouth, throat, and neck was performed.    Blood pressure 135/78, pulse 80, temperature 36.3 C (97.3 F), temperature source Temporal, resp. rate 16, height 1.784 m (5' 10.24"), weight 70.4 kg (155 lb 4.8 oz), SpO2 98 %.   Examination of the neck reveals postsurgical  radiation changes. There fullness near the left mastoid tip, but there is no adenopathy. Normal tracheal and laryngeal crepitus is intact. The ears are clear.   The oral cavity, oropharynx, and nasal cavity are unremarkable with the exception of a 1 mm area of exposed bone on the lingual cortex of the posterior mandibular body. The tonsil fossae ae soft. Due to his active gag reflex, mirror examination was inadequate. Flexible laryngoscopy revealed no evidence of recurrent disease or other abnormalities.      The remainder of my examination revealed no significant abnormalities     I did review his records from his treatment at the outside institution.    James Oneill appears to be disease free. I would like to order some baseline imaging since I did not participate in his original care and am not privy to all of the details - particularly given some of the idiosyncrasies of his exam today. I have referred him ti speech pathology and the dental clinic as well as the neuro pain service for his brachial plexus issues. I advised him of my surveillance and followup routine as well. We will contact him with the imaging resultsand will see him back in 3 months. Obviously I would be more than happy to see him in the interim should he develop any new, progressive, or recurrent symptoms that require my attention.      Thank you very much  for allowing me to participate in his care. I will certainly keep you apprised of his progress.  Please feel free to contact me at any time if you have any questions or concerns.       Sincerely,    Mila Homer. Sabra Heck, MD, FACS  Associate Professor of Otolaryngology and Neurosurgery  Head and Neck Oncologic and Microvascular Surgery  Cranial Base Surgery           This  note represents a late entry after the encounter date. It is transcribed directly from handwritten notes created by me at the time of the encounter.

## 2018-09-26 NOTE — Procedures (Signed)
The nasal cavity was anesthetized with half percent tetracaine and epinephrine. The flexible laryngoscope was inserted through the nasal cavity and passed atraumatically into the pharynx. The pharynx and larynx were visualized. The vocal folds were mobile. The airway is widely patent. There are mild to moderate post-radiation changes but no sign of recurrent disease or other abnormalities. There were no masses or lesions. The scope was withdrawn. He tolerated the procedure well.     Procedures

## 2018-09-30 ENCOUNTER — Ambulatory Visit: Payer: 59 | Admitting: Neurosurgery

## 2018-09-30 ENCOUNTER — Other Ambulatory Visit: Payer: Self-pay | Admitting: Primary Care

## 2018-09-30 ENCOUNTER — Encounter: Payer: Self-pay | Admitting: Neurosurgery

## 2018-09-30 MED ORDER — FENTANYL 12 MCG/HR TD PT72 *I*
1.0000 | MEDICATED_PATCH | TRANSDERMAL | 0 refills | Status: DC
Start: 2018-09-30 — End: 2018-10-23

## 2018-09-30 NOTE — Telephone Encounter (Signed)
We have not filled this, Dr Chancy Milroy did.

## 2018-09-30 NOTE — Progress Notes (Unsigned)
[employer]

## 2018-10-03 ENCOUNTER — Telehealth: Payer: Self-pay | Admitting: Emergency Medicine

## 2018-10-03 NOTE — Telephone Encounter (Signed)
The fentanyl patch requires auth.

## 2018-10-07 ENCOUNTER — Telehealth: Payer: Self-pay | Admitting: Primary Care

## 2018-10-07 NOTE — Telephone Encounter (Signed)
Patient calling wondering what the staus is with his Fentanyl patch prior authorization he is out and needs them as soon as possible, please call him and let him know

## 2018-10-07 NOTE — Telephone Encounter (Signed)
This was sent to PA department 12/19.  Please check into this.

## 2018-10-07 NOTE — Telephone Encounter (Signed)
This was just sent on Thursday. Pending

## 2018-10-08 NOTE — Telephone Encounter (Signed)
Pharmacy aware

## 2018-10-08 NOTE — Telephone Encounter (Signed)
Fetanyl  patches were approved dates are 10/08/18 to 01/06/18 Pa # [id number]

## 2018-10-08 NOTE — Telephone Encounter (Signed)
Patient aware that fentanyl patch was approved.

## 2018-10-23 ENCOUNTER — Other Ambulatory Visit: Payer: Self-pay | Admitting: Primary Care

## 2018-10-23 ENCOUNTER — Telehealth: Payer: Self-pay | Admitting: Primary Care

## 2018-10-23 ENCOUNTER — Telehealth: Payer: Self-pay | Admitting: Emergency Medicine

## 2018-10-23 MED ORDER — FENTANYL 12 MCG/HR TD PT72 *I*
1.0000 | MEDICATED_PATCH | TRANSDERMAL | 0 refills | Status: DC
Start: 2018-10-23 — End: 2018-11-04

## 2018-10-23 MED ORDER — METHYLPHENIDATE HCL 5 MG PO TABS *I*
5.0000 mg | ORAL_TABLET | Freq: Two times a day (BID) | ORAL | 0 refills | Status: DC
Start: 2018-10-23 — End: 2018-12-10

## 2018-10-23 NOTE — Telephone Encounter (Signed)
Left message for patient to call the office

## 2018-10-23 NOTE — Telephone Encounter (Signed)
Patient calling and he has a problem with his Fentanyl Patches. He has been using a patch every 2 days not every 3 days per his previous provider so he is going to run out before the end of the month. He would like to speak with someone about this

## 2018-10-23 NOTE — Telephone Encounter (Signed)
Received by fax from [facility] pharmacy    Methylphenidate HCL 5 mg

## 2018-10-23 NOTE — Telephone Encounter (Signed)
Okay for fentanyl 12.5 mg patches every 48 hours total of 14 patches

## 2018-10-23 NOTE — Telephone Encounter (Signed)
Spoke with patient. He stated that he has always used the Fentanyl patches 12.5mg  every 2 days instead of the 25mg  every 3 days. Patient stated that when he got this prescription it was for 3 days. He stated that he tried doing the 25mg  every 3 days and the pain was unbearable. Patient wants to know if he can go back to doing the 12.5mg  changed every 3 days.

## 2018-10-29 NOTE — Telephone Encounter (Signed)
This was approved dates are 10/28/18 to 1/14//21

## 2018-11-04 ENCOUNTER — Ambulatory Visit: Payer: 59 | Admitting: Primary Care

## 2018-11-04 ENCOUNTER — Telehealth: Payer: Medicaid (Managed Care) | Admitting: Emergency Medicine

## 2018-11-04 ENCOUNTER — Encounter: Payer: Self-pay | Admitting: Primary Care

## 2018-11-04 VITALS — BP 110/88 | HR 95 | Temp 97.8°F | Wt 168.6 lb

## 2018-11-04 DIAGNOSIS — G540 Brachial plexus disorders: Secondary | ICD-10-CM

## 2018-11-04 DIAGNOSIS — F32A Depression, unspecified: Secondary | ICD-10-CM

## 2018-11-04 MED ORDER — FENTANYL 25 MCG/HR TD PT72 *I*
1.0000 | MEDICATED_PATCH | TRANSDERMAL | 0 refills | Status: DC
Start: 2018-11-04 — End: 2018-12-17

## 2018-11-04 NOTE — Progress Notes (Signed)
Patient ID: James Oneill is a 43 y.o. year old male.    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  He describes the pain as being a constant ache with sharp, stabbing exacerbations  Overall he is doing okay.  The pain has resolved.  For the most part his mood is improved.  No homicidal or suicidal ideation.  The left upper extremity discomfort is starting to get worse.  He finds that the fentanyl patch only gets them about 48 hours of any type of relief.    He continues to experience muscle twitching.  He has an EMG scheduled for January 29      The medication and allergy list was reviewed and is accurate  Allergy / Social History / Medications:  No Known Allergies (drug, envir, food or latex)  Social History     Tobacco Use    Smoking status: Never Smoker    Smokeless tobacco: Never Used   Substance Use Topics    Alcohol use: Yes     Alcohol/week: 1.0 standard drinks     Types: 1 Glasses of wine per week     Frequency: 2-4 times a month     Drinks per session: 1 or 2     Binge frequency: Never     Patient's Medications   New Prescriptions    No medications on file   Previous Medications    BISACODYL (BISACODYL) 5 MG EC TABLET    Take 5 mg by mouth daily as needed for Constipation    CYCLOBENZAPRINE (FLEXERIL) 10 MG TABLET    Take 10 mg by mouth 3 times daily as needed for Muscle spasms    FENTANYL (DURAGESIC) 12 MCG/HR PATCH    Place 1 patch onto the skin every 48 hours Max daily dose: 1 patch Remove & discard patch after 72 hours.    FLUOXETINE (PROZAC) 20 MG CAPSULE    Take 1 capsule (20 mg total) by mouth daily    GABAPENTIN (NEURONTIN) 600 MG TABLET    Take 1 tablet (600 mg total) by mouth 3 times daily    METHYLPHENIDATE (RITALIN) 5 MG TABLET    Take 1 tablet (5 mg total) by mouth 2 times daily (before meals) Max daily dose: 10 mg   Modified Medications     No medications on file   Discontinued Medications    No medications on file          Physical Exam:  Vitals:    11/04/18 0936   BP: 110/88   Pulse: 95   Temp: 36.6 C (97.8 F)   Weight: 76.5 kg (168 lb 9.6 oz)     SpO2 Readings from Last 3 Encounters:   11/04/18 98%   09/30/18 99%   09/13/18 98%      Estimated body mass index is 24.19 kg/m as calculated from the following:    Height as of 09/30/18: 1.778 m (5\' 10" ).    Weight as of this encounter: 76.5 kg (168 lb 9.6 oz).  BP Readings from Last 3 Encounters:   11/04/18 110/88   09/30/18 131/84   09/13/18 130/80     Wt Readings from Last 3 Encounters:   11/04/18 76.5 kg (168 lb 9.6 oz)   09/30/18 74.1 kg (163 lb 4.8 oz)  09/13/18 73 kg (161 lb)     Looks well.  Not in any acute distress  Neck-supple no adenopathy or thyromegaly  Cardiovascular-heart rate regular.  Heart sounds 1 and 2 with no added sounds no carotid bruits no edema  Respiratory-lungs are clear to palpation percussion and auscultation    Recent Lab Results:none      Assessment and Plan:     1.  Increasing pain from brachial plexus neuropathy.  Increase fentanyl to 25 g per hour.  Change patch every 48 hours  2.  Controlled depression    Follow-up 1 month  Orders this visit  No orders of the defined types were placed in this encounter.

## 2018-11-04 NOTE — Telephone Encounter (Signed)
[facility] sent by fax prior auth needed    Fentanyl 43mcg/ every 72 hours    Dose has changed

## 2018-11-08 DIAGNOSIS — G540 Brachial plexus disorders: Secondary | ICD-10-CM

## 2018-11-08 NOTE — Progress Notes (Signed)
Pt is 1 years 6 months S/P RT to head and neck.  BP 137/76 (BP Location: Left arm, Patient Position: Sitting, Cuff Size: adult)    Pulse 77    Temp 36.7 C (98 F) (Oral)    Ht 177.8 cm (5\' 10" )    Wt 75.9 kg (167 lb 6.4 oz)    BMI 24.02 kg/m     Pain    11/14/18 1307   PainSc:   4   PainLoc: Neck       Karnofsky Performance Status: 80% - Normal activity with effort, some signs or symptoms of disease    Fatigue: 5 - Moderate fatigue         Fall Risk  RN assessed patient for risk of falls: Yes  Criteria for Risk of Falls: (!) Altered Mobility  Patient at Risk for Falls: (!) Yes  Patient is able to comprehend/follow instructions: Yes  Caregiver is able to care for the patient: N/A  Patient requires physical therapy/occupational therapy consult: N/A  Patient is compliant with activity restrictions: Yes  Fall Risk Interventions: Patient assisted on/off exam tables, Assistive devices placed within easy reach     SMOKING:   no    OTALGIA:  no    DYSPHAGIA:  yes - states it is about the same, States he had a barium swallow and stuff gets stuck.     LARYNGITIS  no    TASTE CHANGES  yes - states first bite will have flavor, but then everything is bland. Uses boost.    XEROSTOMIA  no    FLUORIDE  yes - States he needs to have new fitting. Dr. Sabra Heck put in referral for dentist, but no one has followed up with him yet.    ANOREXIA  no    NAUSEA  no    VOMITING  no    HEARING CHANGE no    RECENT IMAGING:   PET/CT from 08/23/18 states 1.  Prior LEFT tonsillar squamous cell radiation and neck dissection. No hypermetabolic masses or lymphadenopathy in the neck. No evidence of local recurrence.        2. No hypermetabolic distant masses.      Comment: Last seen by neuro 09/30/18 and head and neck 08/09/18.

## 2018-11-12 ENCOUNTER — Ambulatory Visit: Payer: 59 | Admitting: Primary Care

## 2018-11-12 NOTE — Progress Notes (Addendum)
Neuromuscular New Patient Visit:    Dear Dr. Alison Murray,    We had the pleasure of seeing your patient, James Oneill , in the neuromuscular disease clinic here at the [facility].  He is a 43 y.o. year old male referred for evaluation of possible motor neuron disease. He has a past medical history of tonsillar cancer (SCC, treated with radiotherapy) and recurrence treated with a radical neck dissection. Notably, radiotherapy for the cancer left him with a brachial plexopathy causing chronic pain.     In review of the referral documentation (09/02/2018 by Dr. 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 Additionally he had chronic low back pain that would radiate in to the right lower extremity. EMG performed by an outside neurologist was reportedly concerning for motor neuron disease, however I do not see these results for review in our system.     He describes that following his radiation therapy, he developed tingling in his fingers along with numbness and weakness in the upper extremity. He also has experienced muscle atrophy on the left upper shoulder. This occurred suddenly after the radiation. Six months after he developed twitching and cramping of the neck and upper left shoulder. He notes that he had the radical neck dissection. Following six months following the neck dissection he began to develop fasciculations and twitching in the right shoulder and arm. At that time he was going to the gym due to muscle soreness and difficulty getting up stairs due to leg weakness and heaviness. He notes then approximately one year in his tongue and his calves as well. He underwent a muscle biopsy in [place] which was reportedly normal. After this they were considering motor neuron disease.     Overall, the muscles have been the same (twitching, cramping) has been  the same over time. Recently he notes getting an odd sensation of pulling in the bottom left corner of the mouth and it can continue to pull the lip down and he will need to focus to get it to go away. He describes at night he will feel twitches in his trapezius as well.     He notes no significant pain with the twitching. He does have other pain with chronic back pain which he has had for a few years. He describes pain in the left clavicle and shoulder blade.  It is painful to lift the shoulder. He notes it feels like someone broke the bone and is taking something sharp and jabbing it in to him.     The weakness is present and is not completely limited due to pain. The weakness can get worse as the day goes on. He notes weakness in the legs, when trying to walk up stairs. He described wasting of his arms and shoulders. He predominantly can't get his arm above his shoulders. He notes that the arms can go numb at times. Constantly he will have numbness and tingling in the left greater than right arm. He has poor fine motor control in the hands. He also describes some confusion.    He does note intermittent swallowing issues due to lack of saliva that was thought to be related to the radiation as well. He cannot eat dry food. His speech hasn't change significantly, other than being hoarse at times and he can have trouble talking. There is no significant blurred or double vision but  he does note eye strain with looking at things for longer periods of time which can worsen towards the end of the day. There are no bowel or bladder changes outside of some difficulty initiating urination.    He has had some weight fluctuation, and he lost 45 lbs following radiation. He has no significant fevers or chills. There have been no rashes.     Medications:     Current Outpatient Medications   Medication Sig    fentaNYL (DURAGESIC) 25 MCG/HR patch Place 1 patch onto the skin every 48 hours Max daily dose: 1 patch Remove & discard  patch after 72 hours.    methylphenidate (RITALIN) 5 MG tablet Take 1 tablet (5 mg total) by mouth 2 times daily (before meals) Max daily dose: 10 mg    gabapentin (NEURONTIN) 600 MG tablet Take 1 tablet (600 mg total) by mouth 3 times daily    cyclobenzaprine (FLEXERIL) 10 MG tablet Take 10 mg by mouth 3 times daily as needed for Muscle spasms    bisacodyl (BISACODYL) 5 MG EC tablet Take 5 mg by mouth daily as needed for Constipation    FLUoxetine (PROZAC) 20 MG capsule Take 1 capsule (20 mg total) by mouth daily     No current facility-administered medications for this visit.      Allergies:   No Known Allergies (drug, envir, food or latex)    Past Medical History:     Past Medical History:   Diagnosis Date    Cancer     Depression     MVP (mitral valve prolapse)    - Tonsilar cancer  Gall bladder removal    Family History:     Family History   Problem Relation Age of Onset    Cancer Father     Dementia Maternal Grandmother        Social History:     Social History     Socioeconomic History    Marital status: Divorced     Spouse name: Not on file    Number of children: Not on file    Years of education: Not on file    Highest education level: Not on file   Tobacco Use    Smoking status: Never Smoker    Smokeless tobacco: Never Used   Substance and Sexual Activity    Alcohol use: Yes     Alcohol/week: 1.0 standard drinks     Types: 1 Glasses of wine per week     Frequency: 2-4 times a month     Drinks per session: 1 or 2     Binge frequency: Never    Drug use: Yes    Sexual activity: Not Currently   Other Topics Concern    Not on file   Social History Narrative    Not on file       Review of systems:   Constitutional, HEENT, respiratory, cardiac, gastrointestinal, endocrine, urinary, skin, psychosocial, musculoskeletal and neurological symptoms were reviewed and are negative except as noted in the HPI.    Physical Examination:   Blood pressure 149/90, pulse 93, height 1.778 m (5\' 10" ), weight  75.8 kg (167 lb).    General: appears well nourished at this visit and is in no apparent distress.  He is alert and fully oriented and has an excellent fund of knowledge.  He participates appropriately in all aspects of the clinical history and neurological examination.    CV: Pulses 2+    CN: Pupils are  equal, round, and reactive to light. Visual fields are full.  Extra ocular movements are intact.  Fully buries lashes on eye closure.  Able to blow out cheeks and pucker.  No fasciculations or atrophy of the tongue.  Palate elevation is full and symmetrical.  Strength is full on head turn and neck flexion. There is intermittent pulling of the left lower facial muscles, and occasional facial muscle twitching.    Motor (R/L): Muscle bulk appears reduced in the left upper extremity, pectoral and shoulder. Rare fasciculations in the calves.  Neck extension 5.  Shoulder ROM is limited in the left where he cannot raise the arm above his shoulder.  With scapular stabilization the left arm can lift higher. Shoulder abduction 5/4, forward flexion 5/5, external rotation 5/5.  Elbow flexion 5/5, extension 5/5.  Wrist flexion 5/5, extension 5/5.  Finger flexion 5/5, extension 5/4, abduction 5/4.  Thumb abduction 5/4.  Hip flexion 5/5, abduction 5/5.  Knee flexion 5/5, extension 5/5.  Ankle dorsiflexion 5/5, plantarflexion 5/5, inversion 5/5, eversion 5/5.  Great toe extension 5/5.    Tone is within normal limits.    Sensation: Romberg is negative. Reduced light touch vibration in the left forearm and hand. Normal vibration, and pin prick.  Position sense is intact.    Reflexes (R/L): Biceps 1+/1+, triceps 1+/1+, brachioradialis 1+/1+, knee jerk 3+/3+, ankle jerk 2+/2+.  Plantar response is flexion.  No clonus.    Coordination: Finger nose to finger is intact.      Gait: is within normal limits, can heel walk, toe walk, and perform tandem gait.  Can rise from seated position without using arms.    Results:     Please see HPI for  referral documentation results and information.     Assessment:     Post radiation plexopathy, Left brachial plexus    James Oneill is a 43 year old man with a past medical history including tonsilar cancer s/p radiation and radical neck dissection who presents for evaluation of weakness (particularly with the left upper extremity with shoulder abduction and fine motor movements of the hand) in addition to muscle twitching, dystonic facial muscle posturing, and chronic pain. His history is notable for symptoms beginning on the left, and then spreading to the right over the course of about a year following radiation therapy for the tonsillar cancer. His examination today is notable for reduced strength with shoulder abduction, reduced shoulder range of motion (which improves with scapular stabilization) and with reduced sensation to light touch in the left hand and forearm.     One concern includes radiation related causes such as a radiation induced brachial plexopathy, with an alternative diagnosis of motor neuron disease such as ALS being considered. However, given the length of the symptoms, ALS would have likely progressed further than this within two years. If this was related to radiation, we would not expect to see changes in the legs. Radiation induced myelopathy could be considered, however he does not have clear spasticity in the legs or arms, and his reflexes are essentially normal.    EMG today was essentially normal, with normal nerve conduction studies and needle electromyography revealing only mildly reduced recruitment in the left deltoid. The discrepancy between his physical examination and the EMG is interesting and we do not have a formal diagnosis at this time. However, we will see him in follow up in three months to assess for any changes or progression.    Plan:     -  EMG/NCS today, final report pending.   - Follow up in three months, or earlier if indicated to assess for changes in his clinical  status.     Thank you for allowing [place] to participate in the care of your patient. Please call us with any questions.    Magdalene River, MD  Clinical Neurophysiology Fellow    Attestation: I reviewed the history and examined James Oneill.  He has minimal left arm weakness but no signs of diffuse motor neuron involvement. I agree with the fellow's clinical findings, assessment and plan.

## 2018-11-12 NOTE — Telephone Encounter (Signed)
This is pending approval through [place]

## 2018-11-13 ENCOUNTER — Encounter: Payer: Self-pay | Admitting: Neurology

## 2018-11-13 ENCOUNTER — Ambulatory Visit: Payer: Medicaid (Managed Care)

## 2018-11-13 ENCOUNTER — Ambulatory Visit: Payer: Medicaid (Managed Care) | Attending: Neurology | Admitting: Neurology

## 2018-11-13 ENCOUNTER — Telehealth: Payer: Medicaid (Managed Care)

## 2018-11-13 VITALS — BP 149/90 | HR 93 | Ht 70.0 in | Wt 167.0 lb

## 2018-11-13 DIAGNOSIS — G540 Brachial plexus disorders: Secondary | ICD-10-CM

## 2018-11-13 DIAGNOSIS — Z9049 Acquired absence of other specified parts of digestive tract: Secondary | ICD-10-CM | POA: Insufficient documentation

## 2018-11-13 DIAGNOSIS — M6281 Muscle weakness (generalized): Secondary | ICD-10-CM

## 2018-11-13 DIAGNOSIS — R200 Anesthesia of skin: Secondary | ICD-10-CM | POA: Insufficient documentation

## 2018-11-13 NOTE — Telephone Encounter (Signed)
Called pharmacy spoke to Frankewing at [facility] let them know the patches have been approved, she ran item and got a pain status      Attempted to call patient asked him to return call about one of his medications

## 2018-11-13 NOTE — Telephone Encounter (Signed)
Has been approved.

## 2018-11-13 NOTE — Telephone Encounter (Signed)
Patients fetanyl was approved dates are 11/12/18 to 05/13/19

## 2018-11-13 NOTE — Telephone Encounter (Signed)
Patient returned call and let him know that the patches have been approved. Pt thank you for working with me for it.

## 2018-11-14 ENCOUNTER — Ambulatory Visit: Payer: Medicaid (Managed Care) | Attending: Radiation Oncology | Admitting: Radiation Oncology

## 2018-11-14 ENCOUNTER — Other Ambulatory Visit: Payer: Self-pay

## 2018-11-14 ENCOUNTER — Encounter: Payer: Self-pay | Admitting: Radiation Oncology

## 2018-11-14 VITALS — BP 137/76 | HR 77 | Temp 98.0°F | Ht 70.0 in | Wt 167.4 lb

## 2018-11-14 DIAGNOSIS — R5383 Other fatigue: Secondary | ICD-10-CM

## 2018-11-14 LAB — FREE T4/TSH
Free T4: 0.86 ng/dL (ref 0.78–2.19)
TSH: 1.65 mIU/L (ref 0.465–4.680)

## 2018-11-14 NOTE — Procedures (Signed)
Exam location:  [facility] Attending physician: Shary Key, M.D.     Patient: James Oneill, James Oneill  Patient ID: [id number]      Date of Birth: 13-Sep-1976 Height: 5'10" Gender: Male   Report ID: [id number] Exam Date: 11/13/2018     Referring Physician(s):  Dr. Roanna Epley   Provider(s):  Shary Key, M.D. / Dr. Magdalene River     Referring Diagnosis:  Weakness   Final Diagnosis:  Weakness / Numbness     Patient History   James Oneill is a 43 year old man referred to EMG for evaluation of brachial plexopathy (radiation induced) versus motor neuron disease.      The patient was seen in neuromuscular clinic today, 11/13/2018 by Dr. Don Broach. Please refer to his note for full history and physical examination details.      A time out procedure was completed to verify the patient's identity including first and last name, date of birth and confirmed with id band. The correct procedure and site were reviewed with the patient.  Verbal consent was obtained prior to testing.  During performance of nerve conduction studies, distal limb temperature was maintained between 32 to 36 degrees Centigrade.     Clinical Examination   The patient was seen in neuromuscular clinic today, 11/13/2018 by Dr. Don Broach. Please refer to his note for full history and physical examination details.     Motor Nerve Conduction   Nerve and Stimulus Site  Onset Latency  Amplitude  Conduction Velocity  Distance  Normal    Median.L/Abductor pollicis  brevis    Wrist  3.6 ms  12.3 mV       70 mm  yes    Elbow  7.8 ms  11 mV  57.6 m/s  240 mm  yes    Ulnar.L/Abductor digiti minimi    Wrist  3.3 ms  13.6 mV       70 mm  yes    B.Elbow  6.8 ms  12.7 mV  57.3 m/s  200 mm  yes    A.Elbow  8.8 ms  12.8 mV  50.5 m/s  100 mm  yes    Tibial.L/Abductor Hallucis    Ankle  6.1 ms  14.3 mV       90 mm  yes    Pop fossa  12.4 ms  11.3 mV  70.4 m/s  440 mm  yes    Peroneal.L/Extensor digitorum brevis    Ankle  4.7 ms  4.7 mV       90 mm  yes    Fib head  11.7 ms  4 mV   44.8 m/s  310 mm  yes    Pop fossa  13.8 ms  3.5 mV  42.1 m/s  90 mm  yes        Sensory Nerve Conduction   Nerve and Stimulus Site  Segment  Onset Latency  Amplitude  Conduction Velocity  Distance  Normal    Median.L    Wrist  Wrist-Digit II  2.6 ms  44.8 uV  49.9 m/s  130 mm  yes    Mid palm  Digit II-Mid palm  1.1 ms  49.8 uV  52.4 m/s  60 mm  yes         Mid palm-Wrist            48 m/s  70 mm  yes    Ulnar.L    Wrist  Wrist-Digit V  2.3 ms  26.2 uV  48 m/s  110 mm  yes    Radial.L    Forearm  Wrist-Forearm  2 ms  29.1 uV  50.5 m/s  100 mm  yes    Medial antebrachial cutaneous.L    Elbow  Forearm-Elbow  2.2 ms  6 uV  54.9 m/s  120 mm  yes    Lateral antebrachial cutaneous.L    Elbow  Forearm-Elbow  1.7 ms  17.2 uV  64 m/s  110 mm  yes    Sural.L    Calf  Ankle-Calf  2.6 ms  7.3 uV  47 m/s  120 mm  yes    Medial plantar.L    Medial plantar Sole  Medial plantar Sole-Ankle  2.3 ms  10.5 uV  48 m/s  110 mm  yes    Medial antebrachial cutaneous.R    Elbow  Forearm-Elbow  2 ms  7.2 uV  54.2 m/s  110 mm  yes        Needle EMG Data   Muscle S i d e Spontaneous Activity Motor Unit Morphology Interference Pattern     Insertional Activity Fibs/Pos. Waves Fascics Duration Amplitude Phases Activation Recruit ment   Infraspinatus  L  Normal  0  0  Normal  Normal  Normal  Normal  Normal    Deltoid  L  Normal  0  0  Normal  Normal  Normal  Normal  -1    Biceps Brachii  L  Normal  0  0  Normal  Normal  Normal  Normal  Normal    Triceps  L  Normal  0  0  Normal  Normal  Normal  Normal  Normal    Flexor Carpi Radialis  L  Normal  0  0  Normal  Normal  Normal  Normal  Normal    First Dorsal Interosseous  L  Normal  0  0  Normal  Normal  Normal  Normal  Normal    Thoracic paraspinal  L  Normal  0  0  Normal  Normal  Normal  Normal  Normal    Vastus Medius  L  Normal  0  0  Normal  Normal  Normal  Normal  Normal    Tibialis Anterior  L  Normal  0  0  Normal  Normal  Normal  Normal  Normal    Medial Gastrocnemius   L  Normal  0  0   Normal  Normal  Normal  Normal  Normal           Interpretation & Conclusions   This is an essentially normal study.      On the left, median, ulnar, tibial and peroneal motor studies are normal. Median, ulnar, radial, median antebrachial cutaneous, lateral antebrachial cutaneous, sural and medial plantar sensory responses are normal.      Needle electromyography reveal reduced recruitment in the left deltoid. Otherwise, the left infraspinatus, biceps, triceps, flexor carpi radialis, first dorsal interosseous, thoracic paraspinals, tibialis anterior, medial gastrocnemius and vastus medius are normal.      Overall, there is no definitive evidence of a cervical radiculopathy or brachial plexopathy in the left upper extremity.  There is also no evidence of an underlying polyneuropathy or motor neuronopathy.  The isolated finding reduced motor unit recruitment in the deltoid, sparing all other C5-C6 innervated muscles is a nonspecific finding of uncertain clinical significance.      As the teaching physician identified below, I was physically present for  the key portions of the electrodiagnostic examination and I prepared the interpretation at the time of the examination.        Signature   This report signed electronically by    Shary Key, M.D. on 11/13/2018 7:59 PM       Shary Key, M.D. / Dr. Magdalene River

## 2018-11-14 NOTE — Progress Notes (Addendum)
Radiation Oncology Follow up visit (11/14/2018)    Patient Identifier:  James Oneill is a 43 y.o. male with cT1N1 left tonsillar cancer. S/p definitve RT in 2017, recurrence in 2018, underwent neck dissection.    Diagnosis (including stage):  Cancer Staging  Tonsillar cancer  Staging form: Pharynx - HPV-Mediated Oropharynx, AJCC 8th Edition  - Clinical stage from 08/16/2018: Stage I (cT1, cN1, cM0, p16+) - Signed by Bruce Donath, MBBS on 08/16/2018    Oncologic History:   The patient is a 43 y.o. year old male    Mar 03, 2016: CT scan showed left jugulodigastric lymphadenopathy.    02/25/2018: CT neck showed no evidence of recurrence.  (And sternocleidomastoid atrophy after interval left neck dissection.    04/07/2018: PET-CT showed Solitary left level II lymph node is enlarged and hypermetabolic.  No primary or distant metastasis was identified.    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     04/2016: definitve RT alone to left tonsil and bilateral neck 70 Gy with Dr. Isidore Moos at [facility].     07/03/2017: neck recurrence, had left modified radical neck dissection. Path reported level IIA 1/19 LN positive with ECE, level 2B 0/17 LN positive. He did not receive any cancer therapy after surgery.    08/23/2018: PET-CT is negative for tumor recurrence.    The patient reports pain starting 3 months after radiation therapy. He was seen at [facility] previously for possible neuromuscular disease. Had calf biopsy which was normal. But EMG showed abnormality.    History of Present Illness:    Patient has been followed by Neurology for his pain,  Pain Radiating to his arms, left worse than right.  He also reported muscle tightness/scramps in his left upper neck and posterior neck. His left arm has limited movement, can not raise his arm above head. The energy level is decreased, feels fatigued.  There is no odynophagia or dysphagia.  He should be followed and scoped by Dr. Sabra Heck within 3 month after last  visit. However, he did not have an appointment. He is instructed to call Dr. Ammie Ferrier office to schedule an appointment. Dr. Sabra Heck made dental reference, but patient did not have an appointment. He knows to follow up with Dr. Sanjuan Dame office to ask. Overall, symptoms have no interval changes since last visit.    Radiation Considerations:   Previous radiation: Yes to head and neck in 2017.  Pacemaker/defibrillator: no  Insulin Pump: no  Difficulty lay flat on the back: no  Diagnosis of connective tissue disorder: no    Past Medical History  Past Medical History:   Diagnosis Date    Cancer     Depression     MVP (mitral valve prolapse)        Past Surgical History  Past Surgical History:   Procedure Laterality Date    ELBOW FRACTURE SURGERY      GALLBLADDER SURGERY  2019    HERNIA REPAIR  8921    umbilical    NECK SURGERY Left 2018    radical dissection    TONSILLECTOMY  2017    SCC Left tonsil       Medications  Current Outpatient Medications   Medication    fentaNYL (DURAGESIC) 25 MCG/HR patch    methylphenidate (RITALIN) 5 MG tablet    gabapentin (NEURONTIN) 600 MG tablet    bisacodyl (BISACODYL) 5 MG EC tablet    FLUoxetine (PROZAC) 20 MG capsule     No  current facility-administered medications for this visit.        Allergies  No Known Allergies (drug, envir, food or latex)    Family History  Family History   Problem Relation Age of Onset    Cancer Father     Dementia Maternal Grandmother        Social History  Social History     Socioeconomic History    Marital status: Divorced     Spouse name: Not on file    Number of children: Not on file    Years of education: Not on file    Highest education level: Not on file   Tobacco Use    Smoking status: Never Smoker    Smokeless tobacco: Never Used   Substance and Sexual Activity    Alcohol use: Yes     Alcohol/week: 1.0 standard drinks     Types: 1 Glasses of wine per week     Frequency: 2-4 times a month     Drinks per session: 1 or 2     Binge  frequency: Never    Drug use: Not Currently    Sexual activity: Not Currently   Other Topics Concern    Not on file   Social History Narrative    Not on file       Review of Systems: As per history of present illness.  Review of Systems   Constitutional: Positive for malaise/fatigue.   Respiratory: Negative.    Cardiovascular: Negative.    Musculoskeletal: Positive for back pain and neck pain.        Muscle cramps   Neurological: Positive for sensory change and weakness.       Physical Examination  BP 137/76 (BP Location: Left arm, Patient Position: Sitting, Cuff Size: adult)    Pulse 77    Temp 36.7 C (98 F) (Oral)    Ht 177.8 cm (5\' 10" )    Wt 75.9 kg (167 lb 6.4 oz)    BMI 24.02 kg/m     Pain    11/14/18 1307   PainSc:   4   PainLoc: Neck       KPS: 50    Physical Exam  Constitutional:       Appearance: Normal appearance.   HENT:      Head: Normocephalic and atraumatic.      Right Ear: Tympanic membrane and external ear normal.      Left Ear: Tympanic membrane and external ear normal.      Nose: Nose normal.      Mouth/Throat:      Mouth: Mucous membranes are moist.      Pharynx: Oropharynx is clear.   Eyes:      Conjunctiva/sclera: Conjunctivae normal.   Neck:      Musculoskeletal: Neck supple.      Comments: No lymphadenopathy.  Cardiovascular:      Rate and Rhythm: Normal rate and regular rhythm.   Pulmonary:      Effort: Pulmonary effort is normal.      Breath sounds: Normal breath sounds.   Abdominal:      General: Abdomen is flat. Bowel sounds are normal.      Palpations: Abdomen is soft.   Musculoskeletal:      Comments: Bilateral arm has decreased ROM, especially left arm can only abduct 75 degree.    Skin:     General: Skin is warm and dry.   Neurological:      General: No focal deficit  present.      Mental Status: He is alert and oriented to person, place, and time.      Comments: Bilateral UE and LE strength 5/5, might be slightly weaker in his left arm.    Psychiatric:         Mood and Affect:  Mood normal.         Behavior: Behavior normal.         Thought Content: Thought content normal.         Judgment: Judgment normal.       Oral cavity has mucous membranes.  There is no deviation of the tongue.    Diagnostic and Laboratory Data Reviewed:  See subjective above. Both radiologic images and reports were personally reviewed and discussed with James Oneill .      IMPRESSION  James Oneill is a 43 y.o. male with a history of Head and neck cancer, completed RT, had recurrence, treated with neck dissection.  So far, no evidence of tumor recurrence. Patient suffers from arm/shoulder and neck pain starting 3 months post RT. He is following with neurology.     DISCUSSION   PET-CT is negative.    I told him to see Dr. Sabra Heck for scope exam. He will call and make an appointment. He will follow up with dental appointment as well.    --He will call Dr. Ammie Ferrier office to make a follow up appointment and he will ask his dental referral which was made through Dr. Ammie Ferrier office.  --Continue to follow up with Neurology  --Physical therapy?  --TSH and free T4  --RTC in 3 months.    Patient knows to call with questions or concerns.    Addendum: TSH ( 1.65) and free T4 ( 0.86) are normal.

## 2018-11-18 NOTE — Telephone Encounter (Signed)
Pharmacy aware

## 2018-12-02 ENCOUNTER — Encounter: Payer: Self-pay | Admitting: Primary Care

## 2018-12-02 ENCOUNTER — Ambulatory Visit: Payer: Medicaid (Managed Care) | Admitting: Primary Care

## 2018-12-02 VITALS — BP 148/94 | HR 95 | Temp 98.3°F | Wt 167.8 lb

## 2018-12-02 DIAGNOSIS — G540 Brachial plexus disorders: Secondary | ICD-10-CM

## 2018-12-02 DIAGNOSIS — Z85818 Personal history of malignant neoplasm of other sites of lip, oral cavity, and pharynx: Secondary | ICD-10-CM

## 2018-12-02 DIAGNOSIS — R253 Fasciculation: Secondary | ICD-10-CM

## 2018-12-02 NOTE — Progress Notes (Signed)
Patient ID: James Oneill is a 43 y.o. year old male.    Chief Complaint   Patient presents with    Other     4 week recheck     HPI:  James Oneill is here today for reevaluation of his chronic upper extremity pain.  He reports that his pain has improved since switching to fentanyl patch every 48 hours rather than 72 hours.  He reports that he is still having muscle fasciculations.  He did have an evaluation by neurology including EMG which was essentially normal apart from the left deltoid area.  He drinks 2 cups of coffee a day.  He does not drink soda  He does report some muscle cramping/spasms in his neck and shoulders.  Previously he took cyclobenzaprine for this which did help      The medication and allergy list was reviewed and is accurate  Allergy / Social History / Medications:  No Known Allergies (drug, envir, food or latex)  Social History     Tobacco Use    Smoking status: Never Smoker    Smokeless tobacco: Never Used   Substance Use Topics    Alcohol use: Yes     Alcohol/week: 1.0 standard drinks     Types: 1 Glasses of wine per week     Frequency: 2-4 times a month     Drinks per session: 1 or 2     Binge frequency: Never     Patient's Medications   New Prescriptions    No medications on file   Previous Medications    BISACODYL (BISACODYL) 5 MG EC TABLET    Take 5 mg by mouth daily as needed for Constipation    FENTANYL (DURAGESIC) 25 MCG/HR PATCH    Place 1 patch onto the skin every 48 hours Max daily dose: 1 patch Remove & discard patch after 72 hours.    FLUOXETINE (PROZAC) 20 MG CAPSULE    Take 1 capsule (20 mg total) by mouth daily    GABAPENTIN (NEURONTIN) 600 MG TABLET    Take 1 tablet (600 mg total) by mouth 3 times daily    METHYLPHENIDATE (RITALIN) 5 MG TABLET    Take 1 tablet (5 mg total) by mouth 2 times daily (before meals) Max daily dose: 10 mg   Modified Medications    No medications on file   Discontinued Medications    No medications on file          Physical Exam:  Vitals:     12/02/18 1205   BP: (!) 148/94   Pulse: 95   Temp: 36.8 C (98.3 F)   Weight: 76.1 kg (167 lb 12.8 oz)     SpO2 Readings from Last 3 Encounters:   12/02/18 98%   11/04/18 98%   09/30/18 99%      Estimated body mass index is 24.08 kg/m as calculated from the following:    Height as of 11/14/18: 1.778 m (5\' 10" ).    Weight as of this encounter: 76.1 kg (167 lb 12.8 oz).  BP Readings from Last 3 Encounters:   12/02/18 (!) 148/94   11/14/18 137/76   11/13/18 149/90     Wt Readings from Last 3 Encounters:   12/02/18 76.1 kg (167 lb 12.8 oz)   11/14/18 75.9 kg (167 lb 6.4 oz)   11/13/18 75.8 kg (167 lb)     Looks well.  Not in any acute distress  Neck-supple no adenopathy or thyromegaly.  Tender neck musculature  Cardiovascular-heart rate regular.  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  Muscle spasms.  Recommend trial of cyclobenzaprine 5 mg 3 times a day  3. muscle fasciculations.  I asked him to cut out caffeine completely from his diet      Follow-up in one month  Orders this visit  No orders of the defined types were placed in this encounter.

## 2018-12-10 ENCOUNTER — Telehealth: Payer: Self-pay | Admitting: Primary Care

## 2018-12-10 ENCOUNTER — Other Ambulatory Visit: Payer: Self-pay | Admitting: Primary Care

## 2018-12-10 MED ORDER — METHYLPHENIDATE HCL 5 MG PO TABS *I*
5.0000 mg | ORAL_TABLET | Freq: Two times a day (BID) | ORAL | 0 refills | Status: DC
Start: 2018-12-10 — End: 2019-01-14

## 2018-12-10 NOTE — Telephone Encounter (Signed)
Please advise. Flexeril was discontinued.

## 2018-12-10 NOTE — Telephone Encounter (Signed)
Please check with patient if he wishes to continue with cyclobenzaprine.

## 2018-12-10 NOTE — Telephone Encounter (Signed)
James Oneill is requesting a     cyclobenzaprine 5 mg 3 times a day

## 2018-12-11 NOTE — Telephone Encounter (Signed)
Called patient. Mail box is full

## 2018-12-12 MED ORDER — CYCLOBENZAPRINE HCL 5 MG PO TABS *I*
5.0000 mg | ORAL_TABLET | Freq: Three times a day (TID) | ORAL | 2 refills | Status: DC | PRN
Start: 2018-12-12 — End: 2019-01-27

## 2018-12-12 NOTE — Telephone Encounter (Signed)
Prescription for cyclobenzaprine sent to pharmacy for him

## 2018-12-12 NOTE — Telephone Encounter (Signed)
Spoke with patient. He does want to continue with the cyclobenzaprine.

## 2018-12-12 NOTE — Addendum Note (Signed)
Addended by: Gerri Spore on: 12/12/2018 11:27 AM     Modules accepted: Orders

## 2018-12-16 ENCOUNTER — Encounter: Payer: Self-pay | Admitting: Primary Care

## 2018-12-16 ENCOUNTER — Ambulatory Visit: Payer: Medicaid (Managed Care) | Admitting: Primary Care

## 2018-12-16 VITALS — BP 120/86 | HR 89 | Temp 97.6°F | Wt 165.0 lb

## 2018-12-16 DIAGNOSIS — L03211 Cellulitis of face: Secondary | ICD-10-CM

## 2018-12-16 MED ORDER — SULFAMETHOXAZOLE-TRIMETHOPRIM 800-160 MG PO TABS *I*
1.0000 | ORAL_TABLET | Freq: Two times a day (BID) | ORAL | 0 refills | Status: DC
Start: 2018-12-16 — End: 2019-06-16

## 2018-12-16 NOTE — Progress Notes (Signed)
Patient ID: James Oneill is a 43 y.o. year old male.    Chief Complaint   Patient presents with    Other     left side face redness     HPI: James Oneill is here today complaining of a red swelling on his left cheek extending down to the left jaw.  This started yesterday.  No fever no chills.  He had some prednisone left over from previous treatment and took 1 dose yesterday evening.  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  He had a picture on his cell phone of his face from yesterday.  The redness is certainly much less red today        The medication and allergy list was reviewed and is accurate  Allergy / Social History / Medications:  No Known Allergies (drug, envir, food or latex)  Social History     Tobacco Use    Smoking status: Never Smoker    Smokeless tobacco: Never Used   Substance Use Topics    Alcohol use: Yes     Alcohol/week: 1.0 standard drinks     Types: 1 Glasses of wine per week     Frequency: 2-4 times a month     Drinks per session: 1 or 2     Binge frequency: Never     Patient's Medications   New Prescriptions    No medications on file   Previous Medications    BISACODYL (BISACODYL) 5 MG EC TABLET    Take 5 mg by mouth daily as needed for Constipation    CYCLOBENZAPRINE (FLEXERIL) 5 MG TABLET    Take 1 tablet (5 mg total) by mouth 3 times daily as needed for Muscle spasms    FENTANYL (DURAGESIC) 25 MCG/HR PATCH    Place 1 patch onto the skin every 48 hours Max daily dose: 1 patch Remove & discard patch after 72 hours.    FLUOXETINE (PROZAC) 20 MG CAPSULE    Take 1 capsule (20 mg total) by mouth daily    GABAPENTIN (NEURONTIN) 600 MG TABLET    Take 1 tablet (600 mg total) by mouth 3 times daily    METHYLPHENIDATE (RITALIN) 5 MG TABLET    Take 1 tablet (5 mg total) by mouth 2 times daily (before meals) Max daily dose: 10 mg   Modified Medications    No medications on file   Discontinued Medications     No medications on file          Physical Exam:  Vitals:    12/16/18 1455   BP: 120/86   Pulse: 89   Temp: 36.4 C (97.6 F)   Weight: 74.8 kg (165 lb)     SpO2 Readings from Last 3 Encounters:   12/16/18 98%   12/02/18 98%   11/04/18 98%      Estimated body mass index is 23.68 kg/m as calculated from the following:    Height as of 11/14/18: 1.778 m (5\' 10" ).    Weight as of this encounter: 74.8 kg (165 lb).  BP Readings from Last 3 Encounters:   12/16/18 120/86   12/02/18 (!) 148/94   11/14/18 137/76     Wt Readings from Last 3 Encounters:   12/16/18 74.8 kg (165 lb)   12/02/18 76.1 kg (167 lb 12.8 oz)   11/14/18 75.9 kg (167 lb 6.4 oz)  Looks well.  Not in any acute distress  Face- he has a well demarcated area of redness on the left cheek.  He does have some swelling of the left jaw but there is no associated redness.  Neither area seems particularly tender.  Slight induration of the left cheek only.  Mouth is normal  Neck-supple no adenopathy or thyromegaly.  He is status post radical neck dissection  Cardiovascular-heart rate regular.  Heart sounds 1 and 2 with no added sounds no carotid bruits no edema  Respiratory-lungs are clear to palpation percussion and auscultation    Recent Lab Results:none      Assessment and Plan:     Recurrent swelling left cheek.  Suspect it is a mild cellulitis consequence of damage to the lymphatic vessels after radiation.  Will treat with Septra DS twice a day for a week.  Reevaluate if no improvement or if symptoms worsen.  I advised him not to take prednisone without medical supervision in future  Orders this visit  No orders of the defined types were placed in this encounter.

## 2018-12-17 ENCOUNTER — Other Ambulatory Visit: Payer: Self-pay | Admitting: Primary Care

## 2018-12-17 MED ORDER — FENTANYL 25 MCG/HR TD PT72 *I*
1.0000 | MEDICATED_PATCH | TRANSDERMAL | 0 refills | Status: DC
Start: 2018-12-17 — End: 2019-01-14

## 2019-01-06 ENCOUNTER — Ambulatory Visit: Payer: Medicaid (Managed Care) | Admitting: Primary Care

## 2019-01-14 ENCOUNTER — Other Ambulatory Visit: Payer: Self-pay | Admitting: Primary Care

## 2019-01-14 MED ORDER — FENTANYL 25 MCG/HR TD PT72 *I*
1.0000 | MEDICATED_PATCH | TRANSDERMAL | 0 refills | Status: DC
Start: 2019-01-14 — End: 2019-02-14

## 2019-01-14 MED ORDER — METHYLPHENIDATE HCL 5 MG PO TABS *I*
5.0000 mg | ORAL_TABLET | Freq: Two times a day (BID) | ORAL | 0 refills | Status: DC
Start: 2019-01-14 — End: 2019-02-14

## 2019-01-27 ENCOUNTER — Other Ambulatory Visit: Payer: Self-pay | Admitting: Primary Care

## 2019-01-27 MED ORDER — CYCLOBENZAPRINE HCL 5 MG PO TABS *I*
5.0000 mg | ORAL_TABLET | Freq: Three times a day (TID) | ORAL | 2 refills | Status: DC | PRN
Start: 2019-01-27 — End: 2019-03-17

## 2019-01-27 MED ORDER — FLUOXETINE HCL 20 MG PO CAPS *I*
20.0000 mg | ORAL_CAPSULE | Freq: Every day | ORAL | 5 refills | Status: DC
Start: 2019-01-27 — End: 2019-02-28

## 2019-02-04 NOTE — Progress Notes (Signed)
Pt is 2 years 9 months S/P RT to head and neck.    Comment:  Last seen by ENT 08/09/18 with recommendation to follow up in 3 months, but no follow up scheduled. Last visit with Neurology 11/13/2018 with follow up scheduled for 02/12/2019. Last seen by Speech 08/26/18 with recommendation to perform exercises 3 times a day and continue regular diet as tolerated.

## 2019-02-11 NOTE — Progress Notes (Signed)
KL not able to leave voice mail  msg for patient (mailbox full) and sent my chart message letting patient know that zoom video appt will be with Irving Burton and not Dr. Don Broach for 2 pm on 4/29    [facility] 4/28

## 2019-02-12 ENCOUNTER — Telehealth: Payer: Self-pay | Admitting: Neurology

## 2019-02-12 ENCOUNTER — Ambulatory Visit: Payer: Medicaid (Managed Care) | Admitting: Neurology

## 2019-02-12 NOTE — Telephone Encounter (Signed)
Signed onto our zoom meeting at 1400.  Attempted to call patient at [phone] and [phone] with the number listed in his chart.  There was no answer and the mailbox was full.  Signed off zoom visit at 1430.

## 2019-02-13 ENCOUNTER — Telehealth: Payer: Medicaid (Managed Care) | Attending: Radiation Oncology | Admitting: Radiation Oncology

## 2019-02-13 ENCOUNTER — Encounter: Payer: Self-pay | Admitting: Radiation Oncology

## 2019-02-13 ENCOUNTER — Telehealth: Payer: Self-pay | Admitting: Radiation Oncology

## 2019-02-13 DIAGNOSIS — Z08 Encounter for follow-up examination after completed treatment for malignant neoplasm: Secondary | ICD-10-CM

## 2019-02-13 NOTE — Telephone Encounter (Signed)
1: 25 PM: Called patient for scheduled telehome visit today. However, nobody picked up the phone and voice mail box is full.

## 2019-02-13 NOTE — Progress Notes (Signed)
Telephone Visit (02/13/2019)    I spoke with James Oneill regarding his scheduled follow-up appointment for today regarding his history of left tonsillar cancer.    Due to restrictions on non-urgent visits incident to the COVID-19 pandemic, we conducted the visit via telephone, to which patient is agreeable. Pt made aware that if they feel the need to be seen in person for a formal visit, accounting for safety for patient, other patients, staff and provider, arrangements can be made to do so.  Pt declined the need for in person visit.  Pt also aware of limitations of visits without formal physical examination and accepts the need for this omission in the current public health crisis.  Pt has been advised to call the office as soon as possible or contact emergency services for any worsening of their clinical status.    Reason for visit: routine visit    Patient Identifier:  James Oneill is a 43 y.o. male with cT1N1 left tonsillar cancer. S/p definitve RT in 2017, recurrence in 2018, underwent neck dissection.    Diagnosis (including stage):  Cancer Staging  Tonsillar cancer  Staging form: Pharynx - HPV-Mediated Oropharynx, AJCC 8th Edition  - Clinical stage from 08/16/2018: Stage I (cT1, cN1, cM0, p16+) - Signed by Bruce Donath, MBBS on 08/16/2018    Oncologic History:   The patient is a 43 y.o. year old male    Mar 03, 2016: CT scan showed left jugulodigastric lymphadenopathy.    02/25/2018: CT neck showed no evidence of recurrence.  (And sternocleidomastoid atrophy after interval left neck dissection.    04/07/2018: PET-CT showed Solitary left level II lymph node is enlarged and hypermetabolic.  No primary or distant metastasis was identified.    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     04/2016: definitve RT alone to left tonsil and bilateral neck 70 Gy with Dr. Isidore Moos at [facility].     07/03/2017: neck recurrence, had left modified radical neck dissection. Path reported level IIA  1/19 LN positive with ECE, level 2B 0/17 LN positive. He did not receive any cancer therapy after surgery.    08/23/2018: PET-CT is negative for tumor recurrence.    The patient reports pain starting 3 months after radiation therapy. He was seen at [facility] previously for possible neuromuscular disease. Had calf biopsy which was normal. But EMG showed abnormality.    History of Present Illness:    Patient has been followed by Neurology and had EMG done, He still has Pain Radiating to his arms, left worse than right.  He also reported muscle tightness/scramps in his left upper neck and posterior neck are the same. His left arm has limited movement, can not raise his arm above head. The energy level is decreased, feels fatigued.  There is no odynophagia or dysphagia. All of this symptoms are stable, no worsening, but no improvement either. He should be followed and scoped by Dr. Sabra Heck within 3 month after last visit. However, he did not have an appointment. He is instructed to call Dr. Ammie Ferrier office to schedule an appointment at that time. But he did not do so. He promised me will call Dr. Ammie Ferrier office to set up an appointment this time.     He recently had an episode of face redness and swelling, which was treated by Dr. Alison Murray with antibiotics and steroid. He told me he had similar experiences before he moves to this area. and he was treated with dexamethasone.  That makes me think he might have some autoimmune/rheumatology issues.  During the COVID-19 Pandemic, he doesn't want to see a new physician at this moment. he knows he can ask his primary care physician to make that referral. We will make that referral if he still doesn't had one when he returns for a follow-up visit.    Past Medical History  Past Medical History:   Diagnosis Date    Cancer     Depression     MVP (mitral valve prolapse)        Past Surgical History  Past Surgical History:   Procedure Laterality Date    ELBOW FRACTURE SURGERY       GALLBLADDER SURGERY  2019    HERNIA REPAIR  [id number]    umbilical    NECK SURGERY Left 2018    radical dissection    TONSILLECTOMY  2017    SCC Left tonsil       Medications  Current Outpatient Medications   Medication    cyclobenzaprine (FLEXERIL) 5 MG tablet    FLUoxetine (PROZAC) 20 MG capsule    methylphenidate (RITALIN) 5 MG tablet    fentaNYL (DURAGESIC) 25 MCG/HR patch    sulfamethoxazole-trimethoprim (BACTRIM DS,SEPTRA DS) 800-160 MG per tablet    gabapentin (NEURONTIN) 600 MG tablet    bisacodyl (BISACODYL) 5 MG EC tablet     No current facility-administered medications for this visit.        Allergies  Review of Systems: As per history of present illness.  Review of Systems   Constitutional: Positive for malaise/fatigue.   Respiratory: Negative.    Cardiovascular: Negative.    Musculoskeletal: Positive for back pain and neck pain.        Muscle cramps   Neurological: Positive for sensory change and weakness.     Physical Examination  There were no vitals taken for this visit.    There were no vitals filed for this visit.    Not be able to perform.    IMPRESSION  James Oneill is a 43 y.o. male with a history of Head and neck cancer, completed RT, had recurrence, treated with neck dissection.  So far, no evidence of tumor recurrence. Patient suffers from arm/shoulder and neck pain starting 3 months post RT. He knows he should continue to follow up with neurology, dentist, PCP and Dr. Sabra Heck.     DISCUSSION   PET-CT is negative.    --He knows he should continue to follow up with neurology, dentist, PCP and Dr. Sabra Heck. He told me he will wait until COVID-19 pandemic is better because he is scared as he believe he has low immunity resulting from cancer treatment.  --Continue to follow up with Neurology  --Physical therapy?  --Rheumatology referral is pending.  --RTC in 4 months.    Patient knows to call with questions or concerns.    The plan was discussed with the patient and the patient/patient rep  demonstrated understanding to the provider's satisfaction.    Consent was previously obtained from the patient to complete this telephone visit including the potential for financial liability.    11-20 minutes was spent reviewing the EMR and management of this patient.

## 2019-02-14 ENCOUNTER — Other Ambulatory Visit: Payer: Self-pay | Admitting: Primary Care

## 2019-02-14 MED ORDER — METHYLPHENIDATE HCL 5 MG PO TABS *I*
5.0000 mg | ORAL_TABLET | Freq: Two times a day (BID) | ORAL | 0 refills | Status: DC
Start: 2019-02-14 — End: 2019-03-17

## 2019-02-14 MED ORDER — FENTANYL 25 MCG/HR TD PT72 *I*
1.0000 | MEDICATED_PATCH | TRANSDERMAL | 0 refills | Status: DC
Start: 2019-02-14 — End: 2019-03-17

## 2019-02-17 ENCOUNTER — Encounter: Payer: Self-pay | Admitting: Internal Medicine

## 2019-02-27 ENCOUNTER — Telehealth: Payer: Self-pay

## 2019-02-27 NOTE — Telephone Encounter (Signed)
Called patient to schedule a Fuv/Zoom/Amy/Tawil/Reschedule, [facility], No answer and the voicemail is full.

## 2019-02-28 ENCOUNTER — Other Ambulatory Visit: Payer: Self-pay | Admitting: Primary Care

## 2019-02-28 MED ORDER — FLUOXETINE HCL 20 MG PO CAPS *I*
20.0000 mg | ORAL_CAPSULE | Freq: Every day | ORAL | 5 refills | Status: DC
Start: 2019-02-28 — End: 2019-12-30

## 2019-02-28 NOTE — Telephone Encounter (Signed)
Can this prescription be a 30 day supply

## 2019-03-08 ENCOUNTER — Other Ambulatory Visit: Payer: Self-pay | Admitting: Primary Care

## 2019-03-12 ENCOUNTER — Ambulatory Visit: Payer: Medicaid (Managed Care) | Admitting: Neurology

## 2019-03-12 ENCOUNTER — Telehealth: Payer: Self-pay | Admitting: Neurology

## 2019-03-12 NOTE — Telephone Encounter (Signed)
[name] had a telemedicine visit scheduled for today at 1000.  I called him twice but was unable to leave a message as his voicemail box was full. The zoom call was open from 1000-1030 and he did not sign on.

## 2019-03-17 ENCOUNTER — Other Ambulatory Visit: Payer: Self-pay | Admitting: Primary Care

## 2019-03-17 MED ORDER — METHYLPHENIDATE HCL 5 MG PO TABS *I*
5.0000 mg | ORAL_TABLET | Freq: Two times a day (BID) | ORAL | 0 refills | Status: DC
Start: 2019-03-17 — End: 2019-04-17

## 2019-03-17 MED ORDER — CYCLOBENZAPRINE HCL 5 MG PO TABS *I*
5.0000 mg | ORAL_TABLET | Freq: Three times a day (TID) | ORAL | 2 refills | Status: DC | PRN
Start: 2019-03-17 — End: 2019-06-16

## 2019-03-17 MED ORDER — FENTANYL 25 MCG/HR TD PT72 *I*
1.0000 | MEDICATED_PATCH | TRANSDERMAL | 0 refills | Status: DC
Start: 2019-03-17 — End: 2019-04-17

## 2019-04-17 ENCOUNTER — Other Ambulatory Visit: Payer: Self-pay | Admitting: Primary Care

## 2019-04-17 MED ORDER — FENTANYL 25 MCG/HR TD PT72 *I*
1.0000 | MEDICATED_PATCH | TRANSDERMAL | 0 refills | Status: DC
Start: 2019-04-17 — End: 2019-05-16

## 2019-04-17 MED ORDER — METHYLPHENIDATE HCL 5 MG PO TABS *I*
5.0000 mg | ORAL_TABLET | Freq: Two times a day (BID) | ORAL | 0 refills | Status: DC
Start: 2019-04-17 — End: 2019-05-16

## 2019-04-17 NOTE — Telephone Encounter (Signed)
James Oneill is asking for a refill of Fentanyl & Methylphenidate.    James Oneill.

## 2019-04-21 ENCOUNTER — Encounter: Payer: Self-pay | Admitting: Primary Care

## 2019-05-15 ENCOUNTER — Telehealth: Payer: Self-pay | Admitting: Primary Care

## 2019-05-15 NOTE — Telephone Encounter (Signed)
Patient called needing a refill on the following:    Ritalin    fentanyl     [name]

## 2019-05-16 ENCOUNTER — Telehealth: Payer: Self-pay | Admitting: Primary Care

## 2019-05-16 MED ORDER — FENTANYL 25 MCG/HR TD PT72 *I*
1.0000 | MEDICATED_PATCH | TRANSDERMAL | 0 refills | Status: DC
Start: 2019-05-16 — End: 2019-06-16

## 2019-05-16 MED ORDER — FENTANYL 25 MCG/HR TD PT72 *I*
1.0000 | MEDICATED_PATCH | TRANSDERMAL | 0 refills | Status: DC
Start: 2019-05-16 — End: 2019-05-16

## 2019-05-16 MED ORDER — METHYLPHENIDATE HCL 5 MG PO TABS *I*
5.0000 mg | ORAL_TABLET | Freq: Two times a day (BID) | ORAL | 0 refills | Status: DC
Start: 2019-05-16 — End: 2019-06-16

## 2019-05-16 NOTE — Telephone Encounter (Signed)
[facility] called about patient's fentaNYL ([place]) patch. When the medication was sent, there were 2 sets of directions. They need clarification on this, and will need a new script sent regardless.     Please advise.

## 2019-05-16 NOTE — Telephone Encounter (Signed)
New prescription with correct instructions sent

## 2019-05-16 NOTE — Telephone Encounter (Signed)
Please clarify directions ° ° °

## 2019-06-03 NOTE — Progress Notes (Signed)
Pt is 1 years 11 months S/P RT to head and neck.    BP 146/84 (BP Location: Left arm, Patient Position: Sitting, Cuff Size: adult)    Pulse 94    Temp 36.7 C (98 F) (Temporal)    Ht 177.8 cm (5\' 10" )    Wt 70.8 kg (156 lb)    BMI 22.38 kg/m     Pain    06/16/19 1234   PainSc:   3   PainLoc: Neck       Karnofsky Performance Status: 100% - Normal, no complaints, no evidence of disease    Fatigue: 7 - Severe fatigue    Safety/Protective Mechanisms  Two Patient Identifiers Confirmed?: Yes    Fall Risk  RN assessed patient for risk of falls: Yes  Criteria for Risk of Falls: None     SMOKING:   no    OTALGIA:  no    DYSPHAGIA:  yes - missed appointment for barium swallow 09/16/2018 due to weather. States gets fatigued chewing and food gets stuck.    LARYNGITIS  yes - occasional morning and evening.     TASTE CHANGES  no    XEROSTOMIA  yes - Constant    FLUORIDE  No. Needs new script    ANOREXIA  yes - occasional    TUBE FEEDINGS:   no       NAUSEA  no    VOMITING  no    HEARING CHANGE no     RECENT IMAGING:   no      Comment:  Last seen by Neurology 11/13/2018 and has no showed appointments since.  Not following with any other providers.    Patient and patient visitor complied with masking policy throughout the duration of the appointment.   Writer maintained use of mask and faceshield/eyewear throughout  the duration of the appointment.   Writer was within 6 feet of patient for < 5 minutes due to the nature of the appointment.

## 2019-06-04 ENCOUNTER — Other Ambulatory Visit: Payer: Self-pay | Admitting: Primary Care

## 2019-06-16 ENCOUNTER — Ambulatory Visit: Payer: Medicaid (Managed Care) | Attending: Radiation Oncology | Admitting: Radiation Oncology

## 2019-06-16 ENCOUNTER — Telehealth: Payer: Self-pay | Admitting: Primary Care

## 2019-06-16 ENCOUNTER — Encounter: Payer: Self-pay | Admitting: Radiation Oncology

## 2019-06-16 VITALS — BP 146/84 | HR 94 | Temp 98.0°F | Ht 70.0 in | Wt 156.0 lb

## 2019-06-16 DIAGNOSIS — R1310 Dysphagia, unspecified: Secondary | ICD-10-CM

## 2019-06-16 DIAGNOSIS — C099 Malignant neoplasm of tonsil, unspecified: Secondary | ICD-10-CM

## 2019-06-16 DIAGNOSIS — Z08 Encounter for follow-up examination after completed treatment for malignant neoplasm: Secondary | ICD-10-CM

## 2019-06-16 MED ORDER — FENTANYL 25 MCG/HR TD PT72 *I*
1.0000 | MEDICATED_PATCH | TRANSDERMAL | 0 refills | Status: DC
Start: 2019-06-16 — End: 2019-08-05

## 2019-06-16 MED ORDER — METHYLPHENIDATE HCL 5 MG PO TABS *I*
5.0000 mg | ORAL_TABLET | Freq: Two times a day (BID) | ORAL | 0 refills | Status: DC
Start: 2019-06-16 — End: 2019-08-05

## 2019-06-16 MED ORDER — CYCLOBENZAPRINE HCL 5 MG PO TABS *I*
5.0000 mg | ORAL_TABLET | Freq: Three times a day (TID) | ORAL | 2 refills | Status: DC | PRN
Start: 2019-06-16 — End: 2019-08-05

## 2019-06-16 NOTE — Telephone Encounter (Signed)
Please let James Oneill know that this is the last refill.  He does need to be evaluated in person for any refills

## 2019-06-16 NOTE — Telephone Encounter (Signed)
Patient scheduled 06/17/2019 @ 9:45 AM

## 2019-06-16 NOTE — Telephone Encounter (Signed)
Patient called needing a refill on the following:    Fentanyl    Methylphenidate    Cyclobenzaprine      Patient uses [facility] in [place]

## 2019-06-16 NOTE — Telephone Encounter (Signed)
Patient aware and agreeable to make appt.  Please schedule.

## 2019-06-17 ENCOUNTER — Encounter: Payer: Self-pay | Admitting: Primary Care

## 2019-06-17 ENCOUNTER — Ambulatory Visit: Payer: Medicaid (Managed Care) | Admitting: Primary Care

## 2019-06-17 ENCOUNTER — Other Ambulatory Visit: Payer: Self-pay | Admitting: Radiation Oncology

## 2019-06-17 VITALS — BP 118/78 | HR 93 | Temp 98.0°F | Ht 70.0 in | Wt 155.5 lb

## 2019-06-17 DIAGNOSIS — G540 Brachial plexus disorders: Secondary | ICD-10-CM

## 2019-06-17 DIAGNOSIS — Z85818 Personal history of malignant neoplasm of other sites of lip, oral cavity, and pharynx: Secondary | ICD-10-CM

## 2019-06-17 DIAGNOSIS — K5900 Constipation, unspecified: Secondary | ICD-10-CM

## 2019-06-17 LAB — T4, FREE: Free T4: 1.08 ng/dL (ref 0.90–1.70)

## 2019-06-17 LAB — TSH: TSH: 3.7 u[IU]/mL (ref 0.270–4.200)

## 2019-06-17 MED ORDER — POLYETHYLENE GLYCOL 3350 PO POWD *I*
17.0000 g | Freq: Every day | ORAL | 1 refills | Status: DC
Start: 2019-06-17 — End: 2020-04-04

## 2019-06-17 NOTE — Progress Notes (Signed)
Patient ID: James Oneill is a 43 y.o. year old male.    Chief Complaint   Patient presents with    Follow-up     meds     HPI:  James Oneill is here today for reevaluation of his chronic left-sided neck and upper extremity pain and muscle fasciculations.  The pain seems to be well controlled with Duragesic patch changed every 48 hours.  He is still having muscle twitching although it is somewhat better since starting cyclobenzaprine.  No muscle weakness.  Continues to struggle with constipation.  Can go couple days without a bowel movement.  The current time is only taking bites of total.  He is not taking any osmotic laxative or stool softener.  Has lost 10 pounds in weight.  Has no explanation for this.  Continues to follow with oncology      The medication and allergy list was reviewed and is accurate  Allergy / Social History / Medications:  No Known Allergies (drug, envir, food or latex)  Social History     Tobacco Use    Smoking status: Never Smoker    Smokeless tobacco: Never Used   Substance Use Topics    Alcohol use: Yes     Alcohol/week: 1.0 standard drinks     Types: 1 Glasses of wine per week     Frequency: 2-4 times a month     Drinks per session: 1 or 2     Binge frequency: Never     Patient's Medications   New Prescriptions    No medications on file   Previous Medications    BISACODYL (BISACODYL) 5 MG EC TABLET    Take 5 mg by mouth daily as needed for Constipation    CYCLOBENZAPRINE (FLEXERIL) 5 MG TABLET    Take 1 tablet (5 mg total) by mouth 3 times daily as needed for Muscle spasms    FENTANYL (DURAGESIC) 25 MCG/HR PATCH    Place 1 patch onto the skin every 48 hours Max daily dose: 1 patch Remove & discard patch after 48 hours.    FLUOXETINE (PROZAC) 20 MG CAPSULE    Take 1 capsule (20 mg total) by mouth daily    GABAPENTIN 600 MG TABLET    TAKE 1 TABLET BY MOUTH THREE TIMES DAILY    METHYLPHENIDATE (RITALIN) 5 MG TABLET    Take 1 tablet (5 mg total) by mouth 2 times daily (before meals) Max  daily dose: 10 mg   Modified Medications    No medications on file   Discontinued Medications    No medications on file          Physical Exam:  Vitals:    06/17/19 0901   BP: 118/78   Pulse: 93   Temp: 36.7 C (98 F)   Weight: 70.5 kg (155 lb 8 oz)   Height: 1.778 m (5\' 10" )     SpO2 Readings from Last 3 Encounters:   06/17/19 98%   12/16/18 98%   12/02/18 98%      Estimated body mass index is 22.31 kg/m as calculated from the following:    Height as of this encounter: 1.778 m (5\' 10" ).    Weight as of this encounter: 70.5 kg (155 lb 8 oz).  BP Readings from Last 3 Encounters:   06/17/19 118/78   06/16/19 146/84   12/16/18 120/86     Wt Readings from Last 3 Encounters:   06/17/19 70.5 kg (155 lb 8 oz)   06/16/19  70.8 kg (156 lb)   12/16/18 74.8 kg (165 lb)     Looks well.  Not in any acute distress  Neck- left sided neck tissues are firm to palpation no adenopathy or thyromegaly  Cardiovascular-heart rate regular.  Heart sounds 1 and 2 with no added sounds no carotid bruits no edema  Respiratory-lungs are clear to palpation percussion and auscultation.  Abdomen is soft without any masses or tenderness.  Bowel sounds normal    Recent Lab Results:none      Assessment and Plan:     1.  Chronic left neck and upper extremity pain secondary to brachial plexus neuropathy.  May continue with Duragesic  2.  Muscle fasciculations.  Continue with cyclobenzaprine  3.  Constipation.  Recommend he takes polyethylene glycol 17 g every day    Planned office follow-up in 3 months for reevaluation  Orders this visit  No orders of the defined types were placed in this encounter.

## 2019-06-19 ENCOUNTER — Ambulatory Visit: Payer: Medicaid (Managed Care) | Admitting: Radiation Oncology

## 2019-06-19 ENCOUNTER — Encounter: Payer: Self-pay | Admitting: Radiation Oncology

## 2019-06-19 NOTE — Progress Notes (Signed)
Radiation oncology follow-up clinic note 06/16/19      Patient Identifier:  James Oneill is a 43 y.o. male with cT1N1 left tonsillar cancer. S/p definitve RT in 2017, recurrence in 2018, underwent neck dissection. He is seen in follow up    Diagnosis (including stage):  Cancer Staging  Tonsillar cancer  Staging form: Pharynx - HPV-Mediated Oropharynx, AJCC 8th Edition  - Clinical stage from 08/16/2018: Stage I (cT1, cN1, cM0, p16+) - Signed by Bruce Donath, MBBS on 08/16/2018    Oncologic History:   The patient is a 43 y.o. year old male    Mar 03, 2016: CT scan showed left jugulodigastric lymphadenopathy.    02/25/2018: CT neck showed no evidence of recurrence.  (And sternocleidomastoid atrophy after interval left neck dissection.    04/07/2018: PET-CT showed Solitary left level II lymph node is enlarged and hypermetabolic.  No primary or distant metastasis was identified.    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     04/2016: definitve RT alone to left tonsil and bilateral neck 70 Gy with Dr. Isidore Moos at [facility].     07/03/2017: neck recurrence, had left modified radical neck dissection. Path reported level IIA 1/19 LN positive with ECE, level 2B 0/17 LN positive. He did not receive any cancer therapy after surgery.    08/23/2018: PET-CT is negative for tumor recurrence.    The patient reports pain starting 3 months after radiation therapy. He was seen at [facility] previously for possible neuromuscular disease. Had calf biopsy which was normal. But EMG showed abnormality.      Previous Oncologic History:  Cancer Staging  Tonsillar cancer  Staging form: Pharynx - HPV-Mediated Oropharynx, AJCC 8th Edition  - Clinical stage from 08/16/2018: Stage I (cT1, cN1, cM0, p16+) - Signed by Bruce Donath, MBBS on 08/16/2018        Current therapy: surveillance    Interval History:    Stable neurologic symptoms, which range from torticollis episodes of the left neck and fasciculations around the left  vermillion lip and include right sided and calf fasciculations as well, no new numbness, seizures, headaches, vertigo, otalgia, falls. He has had an extensive neurologic work up and stopped pursuing this as he was unsure where it was headed. This is my first time seeing him and he says none of these symptoms are worse.    Dysphagia to solids, stuck in throat, a bit worse. Stable xerostomia with significant dental disease, no throat pain or aspiration events, no masses in the neck. Not using tobacco, no dyspnea     Past Medical History:   Diagnosis Date    Cancer     Depression     MVP (mitral valve prolapse)    ,   Past Surgical History:   Procedure Laterality Date    ELBOW FRACTURE SURGERY      GALLBLADDER SURGERY  2019    HERNIA REPAIR  [id number]    umbilical    NECK SURGERY Left 2018    radical dissection    TONSILLECTOMY  2017    SCC Left tonsil   ,   Social History     Socioeconomic History    Marital status: Divorced     Spouse name: Not on file    Number of children: Not on file    Years of education: Not on file    Highest education level: Not on file   Tobacco Use    Smoking status: Never Smoker  Smokeless tobacco: Never Used   Substance and Sexual Activity    Alcohol use: Yes     Alcohol/week: 1.0 standard drinks     Types: 1 Glasses of wine per week     Frequency: 2-4 times a month     Drinks per session: 1 or 2     Binge frequency: Never    Drug use: Not Currently    Sexual activity: Not Currently   Other Topics Concern    Not on file   Social History Narrative    Not on file    and No Known Allergies (drug, envir, food or latex)    ROS  8 pt ROS obtained and negative    Vitals:    06/16/19 1234   BP: 146/84   Pulse: 94   Temp: 36.7 C (98 F)   Weight: 70.8 kg (156 lb)   Height: 177.8 cm (5\' 10" )     Pain    06/16/19 1234   PainSc:   3   PainLoc: Neck     Performance Status: Score-70-Cares for self, unable to do activities or active work.    Physical Exam  Sitting in chair,  comfortable  No trismus, oral mucosa moist, no lesions in tonsillar pillar or right tonsil region, no obvious base of tongue lesions, poor dentition particularly on upper row teeth, tongue movement in tact without fasciculation   Left facial skin retraction with smile  Left neck firm, no masses, right neck also without masses, range of motion in tact    Without topical anesthetic into the nares, the flexible laryngoscope was atraumatically inserted into the left nare. The nasal mucosa was adequately hydrated and had some moderate clear discharge. The nasopharynx was clear of any lesions. The visualized pharyngeal wall was clear of any lesions. Base of tongue, vallecula clear, epiglottis crisp, no laryngeal or supraglottic lesions, pyriform sinus clear. The scope was then withdrawn      Imaging review:  N/a    Assessment:  No evidence of recurrence.    Will arrange reconnection with Dr. Sabra Heck for surveillance, to be seen 4 months after. Referral to speech for evaluation, suspect fibrosis maybe amenable to dilatation.     TSH/T4 today. No tobacco use history so outside annual CT guidelines per [facility]CC[facility].     Plan:   Continue surveillance monitoring. The patient understands the importance of follow up  -RTC 4 months after seeing Dr. Sabra Heck

## 2019-06-20 ENCOUNTER — Other Ambulatory Visit: Payer: Self-pay | Admitting: Radiation Oncology

## 2019-06-20 ENCOUNTER — Telehealth: Payer: Self-pay

## 2019-06-20 DIAGNOSIS — R1310 Dysphagia, unspecified: Secondary | ICD-10-CM

## 2019-06-20 NOTE — Telephone Encounter (Signed)
-----   Message from [name], MD sent at 06/19/2019  8:14 PM EDT -----  Regarding: call pt please  Could you call pt and let him know his Thyroid blood work was normal.    Thank you  [name]

## 2019-06-20 NOTE — Telephone Encounter (Signed)
Writer called patient and gave Dr. Maisie Fus advisement. Patient verbalizes an understanding and has no further questions or concerns at this time.

## 2019-06-24 ENCOUNTER — Telehealth: Payer: Self-pay | Admitting: Speech Pathology

## 2019-06-24 DIAGNOSIS — C099 Malignant neoplasm of tonsil, unspecified: Secondary | ICD-10-CM

## 2019-06-24 NOTE — Telephone Encounter (Signed)
Patient is scheduled for Pharyngogram 07/21/19 at 3:00PM with Pauline Aus, CCC-SLP.     As of 04/08/19 COVID-19 pre-procedure testing is a requirement for pharyngograms. Testing must be completed 2-5 days prior to the scheduled appointment.    Patient requires [id number] testing for the pharyngogram that is scheduled on 07/21/19. Please place an order for the COVID-19 testing and the Speech Department will call and notify the patient of this pre-procedure requirement and available testing sites.

## 2019-06-26 ENCOUNTER — Other Ambulatory Visit: Payer: Self-pay | Admitting: Radiation Oncology

## 2019-06-26 DIAGNOSIS — R1310 Dysphagia, unspecified: Secondary | ICD-10-CM

## 2019-07-01 ENCOUNTER — Encounter: Payer: Self-pay | Admitting: Speech Pathology

## 2019-07-01 NOTE — Telephone Encounter (Signed)
Spoke with patient and confirmed appointment.     Spoke with patient and relayed the following information:  1) COVID-19 testing is required NO more than 5 days and NO less than 2 days prior to your appointment.   2) Several sites are available to complete this testing.   3) If COVID testing is not completed in time, your Pharyngogram will be cancelled.     Identified [name] [place] [facility] as closest testing site. Patient did not need contact information as it is close to home.

## 2019-07-17 ENCOUNTER — Other Ambulatory Visit: Payer: Self-pay | Admitting: Otolaryngology

## 2019-07-18 LAB — COVID-19 NAAT (PCR): COVID-19 NAAT (PCR): NEGATIVE

## 2019-07-18 LAB — COVID-19 PCR

## 2019-07-21 ENCOUNTER — Ambulatory Visit: Payer: Medicaid (Managed Care) | Admitting: Speech-Language Pathologist

## 2019-07-21 ENCOUNTER — Other Ambulatory Visit: Payer: Medicaid (Managed Care)

## 2019-07-21 ENCOUNTER — Ambulatory Visit: Payer: Medicaid (Managed Care)

## 2019-08-05 ENCOUNTER — Other Ambulatory Visit: Payer: Self-pay | Admitting: Primary Care

## 2019-08-05 MED ORDER — METHYLPHENIDATE HCL 5 MG PO TABS *I*
5.0000 mg | ORAL_TABLET | Freq: Two times a day (BID) | ORAL | 0 refills | Status: DC
Start: 2019-08-05 — End: 2019-09-17

## 2019-08-05 MED ORDER — FENTANYL 25 MCG/HR TD PT72 *I*
1.0000 | MEDICATED_PATCH | TRANSDERMAL | 0 refills | Status: DC
Start: 2019-08-05 — End: 2019-09-17

## 2019-08-05 MED ORDER — CYCLOBENZAPRINE HCL 5 MG PO TABS *I*
5.0000 mg | ORAL_TABLET | Freq: Three times a day (TID) | ORAL | 2 refills | Status: DC | PRN
Start: 2019-08-05 — End: 2019-09-17

## 2019-08-05 NOTE — Telephone Encounter (Signed)
Kiriakos is calling in regards to he needs a refill on his Cyclobenzaprine Fentanyl and the Ritalin  He uses [facility] in [place]

## 2019-08-07 ENCOUNTER — Ambulatory Visit: Payer: Medicaid (Managed Care) | Admitting: Primary Care

## 2019-08-07 ENCOUNTER — Telehealth: Payer: Self-pay | Admitting: Primary Care

## 2019-08-07 ENCOUNTER — Encounter: Payer: Self-pay | Admitting: Primary Care

## 2019-08-07 VITALS — BP 130/76 | HR 99 | Temp 98.4°F | Wt 159.0 lb

## 2019-08-07 DIAGNOSIS — B009 Herpesviral infection, unspecified: Secondary | ICD-10-CM

## 2019-08-07 DIAGNOSIS — K5900 Constipation, unspecified: Secondary | ICD-10-CM

## 2019-08-07 MED ORDER — VALACYCLOVIR HCL 1000 MG PO TABS *I*
1000.0000 mg | ORAL_TABLET | Freq: Two times a day (BID) | ORAL | 0 refills | Status: DC
Start: 2019-08-07 — End: 2019-08-13

## 2019-08-07 MED ORDER — AMOXICILLIN-POT CLAVULANATE 875-125 MG PO TABS *I*
1.0000 | ORAL_TABLET | Freq: Two times a day (BID) | ORAL | 0 refills | Status: DC
Start: 2019-08-07 — End: 2019-08-13

## 2019-08-07 NOTE — Progress Notes (Signed)
Patient ID: James Oneill is a 43 y.o. year old male.    Chief Complaint   Patient presents with    Other     Abd discomfort, left nasal/facial redness, swelling     HPI:  James Oneill is here today reporting left facial discomfort per the started about 2 days ago.  Initially he had a soreness between his nostrils.  He then went on to develop sharp shooting pains affecting the whole of the left side of his face.  Yesterday he developed a 10-day swollen erythematous area on the left cheek.  He has not had any fever or chills    He has longstanding left lower quadrant/groin discomfort.  He describes this as an intermittent sharp stabbing pain.  He has had this for several months.  It comes and goes.  He can go days without it.  He is chronically constipated      The medication and allergy list was reviewed and is accurate  Allergy / Social History / Medications:  No Known Allergies (drug, envir, food or latex)  Social History     Tobacco Use    Smoking status: Never Smoker    Smokeless tobacco: Never Used   Substance Use Topics    Alcohol use: Yes     Alcohol/week: 1.0 standard drinks     Types: 1 Glasses of wine per week     Frequency: 2-4 times a month     Drinks per session: 1 or 2     Binge frequency: Never     Patient's Medications   New Prescriptions    No medications on file   Previous Medications    BISACODYL (BISACODYL) 5 MG EC TABLET    Take 5 mg by mouth daily as needed for Constipation    CYCLOBENZAPRINE (FLEXERIL) 5 MG TABLET    Take 1 tablet (5 mg total) by mouth 3 times daily as needed for Muscle spasms    FENTANYL (DURAGESIC) 25 MCG/HR PATCH    Place 1 patch onto the skin every 48 hours Max daily dose: 1 patch Remove & discard patch after 48 hours.    FLUOXETINE (PROZAC) 20 MG CAPSULE    Take 1 capsule (20 mg total) by mouth daily    GABAPENTIN 600 MG TABLET    TAKE 1 TABLET BY MOUTH THREE TIMES DAILY    METHYLPHENIDATE (RITALIN) 5 MG TABLET    Take 1 tablet (5 mg total) by mouth 2 times daily (before  meals) Max daily dose: 10 mg    POLYETHYLENE GLYCOL (GLYCOLAX) 17 GM/SCOOP POWDER    Take 17 g by mouth daily Mix in 8 oz water or juice and drink.   Modified Medications    No medications on file   Discontinued Medications    No medications on file          Physical Exam:  Vitals:    08/07/19 1444   BP: 130/76   Pulse: 99   Temp: 36.9 C (98.4 F)   Weight: 72.1 kg (159 lb)     SpO2 Readings from Last 3 Encounters:   08/07/19 98%   06/17/19 98%   12/16/18 98%      Estimated body mass index is 22.81 kg/m as calculated from the following:    Height as of 06/17/19: 1.778 m (5\' 10" ).    Weight as of this encounter: 72.1 kg (159 lb).  BP Readings from Last 3 Encounters:   08/07/19 130/76   06/17/19 118/78  06/16/19 146/84     Wt Readings from Last 3 Encounters:   08/07/19 72.1 kg (159 lb)   06/17/19 70.5 kg (155 lb 8 oz)   06/16/19 70.8 kg (156 lb)     Looks well.  Not in any acute distress  Face-he has a few vesicles between his nostrils.  He has a tender area of erythematous induration on the left cheek approximately 3 cm in its greatest diameter.  Mustache is normal.  ENT is normal  Neck-supple.  No adenopathy or thyromegaly  Cardiovascular-heart rate regular.  Heart sounds 1 and 2 with no added sounds no carotid bruits no edema  Respiratory-lungs are clear to palpation percussion and auscultation  Abdomen is soft without any masses.  He does have a loaded sigmoid colon which is tender.  No hernia.  Normal genitalia  Recent Lab Results:none      Assessment and Plan:     1.  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  Will treat with valacyclovir and Augmentin.  Reevaluation 1 week sooner if concerns  2.  Chronic intermittent left lower quadrant discomfort most likely related to his chronic constipation.        Orders this visit  No orders of the defined types were placed in this encounter.

## 2019-08-07 NOTE — Telephone Encounter (Signed)
Tried to call Jaydden, he needs to be seen before Dr.Ashdown can prescribe or make recommendations. Please have him keep his appointment.

## 2019-08-07 NOTE — Telephone Encounter (Signed)
Not comfortable doing that.  Needs to be seen first

## 2019-08-07 NOTE — Telephone Encounter (Signed)
Jasiel has a star door appt this afternoon and is asking if there was any possible way he could come in any sooner. His original appt was at 3:30pm and I did move him up to 2:30pm (also explained star door procedure).    Deran states that he is having Sinusitis symptoms and is in a lot of pain. He reports a history of cancer of that area as well. He is asking if an antibiotic and steroid could be sent in for him if he's unable to be seen sooner today. He does NOT want any pain medication. He uses [facility].

## 2019-08-07 NOTE — Telephone Encounter (Signed)
Patient agreeable to keeping appt this afternoon, knows to call with any changes in the interim

## 2019-08-07 NOTE — Telephone Encounter (Signed)
Patient is reporting left sided facial pain and swelling as well as runny nose, the swelling starts around the eye, extending down to the collarbone. Swelling started about 2 days ago and is at its worst currently. Denies fever, cough, sob, sore throat. He states he has not tried any OTC conservative treatment. He states this is something that happens semi annually since he got radiation in the area. He does not wish to wait until 230 to be seen he would really like you to treat him first and then see him later today for his appointment. He states "I normally get an antibiotic and a steriod, Dr.Ashdown is familiar with my situation", I let the patient know I would pass his concerns on to the MD and we would get back with him shortly.

## 2019-08-08 ENCOUNTER — Telehealth: Payer: Self-pay | Admitting: Primary Care

## 2019-08-08 NOTE — Telephone Encounter (Signed)
Left a message for patient to call and schedule a 1 wk fu with Dr Alison Murray. He was seen 08/07/19. Please feel free to schedule.

## 2019-08-11 NOTE — Telephone Encounter (Signed)
Spoke to patient, he is scheduled for Wednesday

## 2019-08-13 ENCOUNTER — Ambulatory Visit: Payer: Medicaid (Managed Care) | Admitting: Primary Care

## 2019-08-13 ENCOUNTER — Encounter: Payer: Self-pay | Admitting: Primary Care

## 2019-08-13 ENCOUNTER — Other Ambulatory Visit: Payer: Self-pay | Admitting: Primary Care

## 2019-08-13 VITALS — BP 120/74 | HR 103 | Temp 98.1°F | Ht 70.0 in | Wt 161.4 lb

## 2019-08-13 DIAGNOSIS — R5383 Other fatigue: Secondary | ICD-10-CM

## 2019-08-13 LAB — COMPREHENSIVE METABOLIC PANEL
ALT: 18 U/L (ref 0–50)
AST: 24 U/L (ref 0–50)
Albumin: 4.9 g/dL (ref 3.5–5.2)
Alk Phos: 53 U/L (ref 40–130)
Anion Gap: 8 mmol/L (ref 6–15)
Bilirubin,Total: 1 mg/dL (ref 0.0–1.2)
CO2: 30 mmol/L — ABNORMAL HIGH (ref 20–28)
Calcium: 10.5 mg/dL — ABNORMAL HIGH (ref 8.6–10.2)
Chloride: 103 mmol/L (ref 96–108)
Creatinine: 1.06 mg/dL (ref 0.67–1.17)
GFR,Black: 91.42 (ref >60)
GFR,Caucasian: 75.55 (ref >60)
Glucose: 75 mg/dL (ref 60–99)
Lab: 20.8 mg/dL — ABNORMAL HIGH (ref 6.0–20.0)
Potassium: 5.4 mmol/L — ABNORMAL HIGH (ref 3.3–5.1)
Sodium: 141 mmol/L (ref 133–145)
Total Protein: 7.4 g/dL (ref 6.3–7.7)

## 2019-08-13 LAB — CBC AND DIFFERENTIAL
Baso # K/uL: 0.03 10*3/uL (ref 0.01–0.08)
Basophil %: 0.5 % (ref 0.2–1.2)
Eos # K/uL: 0.08 10*3/uL (ref 0.04–0.54)
Eosinophil %: 1.3 % (ref 0.8–7.0)
Hematocrit: 43.2 % (ref 40.1–51.0)
Hemoglobin: 14.8 g/dL (ref 13.7–17.5)
IMM Granulocytes #: 0.2 %
IMM Granulocytes: 0.01 10*3/uL — ABNORMAL HIGH (ref 0.0–0.0)
Lymph # K/uL: 1.07 10*3/uL — ABNORMAL LOW (ref 1.32–3.57)
Lymphocyte %: 17.4 % — ABNORMAL LOW (ref 21.8–53.1)
MCH: 31.2 pg (ref 25.7–32.2)
MCHC: 34.3 g/dl (ref 32.3–36.5)
MCV: 91.1 fl (ref 79.0–92.2)
Mean Platelet Volume: 9.8 fl (ref 9.4–12.4)
Mono # K/uL: 0.57 10*3/uL (ref 0.30–0.82)
Monocyte %: 9.3 % (ref 5.3–12.2)
Neut # K/uL: 4.39 10*3/uL (ref 1.78–5.38)
Nucl RBC # K/uL: 0 10*3/uL (ref 0.00–0.00)
Nucl RBC %: 0 (ref 0.0–0.2)
Platelets: 281 10*3/uL (ref 150–330)
RBC Distribution Width-SD: 41.7 fl (ref 35.1–43.9)
RBC: 4.74 10*6/uL (ref 4.63–6.08)
RDW: 12.7 % (ref 11.6–14.4)
Seg Neut %: 71.3 % — ABNORMAL HIGH (ref 34.0–67.9)
WBC: 6.15 10*3/uL (ref 4.23–9.07)

## 2019-08-13 LAB — TSH: TSH: 3.46 u[IU]/mL (ref 0.270–4.200)

## 2019-08-13 LAB — T4, FREE: Free T4: 1.03 ng/dL (ref 0.90–1.70)

## 2019-08-13 NOTE — Progress Notes (Signed)
Patient ID: James Oneill is a 43 y.o. year old male.    Chief Complaint   Patient presents with    Other     headache fatigue     HPI:  James Oneill is here today for reevaluation of his suspected herpes labialis and secondary cellulitis.  The pain and swelling on the left cheek has resolved.  He does report bitemporal headaches.  No aggravating relieving factors.  No nausea or vomiting.  He has also had some night sweats.  He complains of being more tired than usual.  He sleeps well at nighttime.  Weight is stable      The medication and allergy list was reviewed and is accurate  Allergy / Social History / Medications:  No Known Allergies (drug, envir, food or latex)  Social History     Tobacco Use    Smoking status: Never Smoker    Smokeless tobacco: Never Used   Substance Use Topics    Alcohol use: Yes     Alcohol/week: 1.0 standard drinks     Types: 1 Glasses of wine per week     Frequency: 2-4 times a month     Drinks per session: 1 or 2     Binge frequency: Never     Patient's Medications   New Prescriptions    No medications on file   Previous Medications    AMOXICILLIN-CLAVULANATE (AUGMENTIN) 875-125 MG PER TABLET    Take 1 tablet by mouth 2 times daily    BISACODYL (BISACODYL) 5 MG EC TABLET    Take 5 mg by mouth daily as needed for Constipation    CYCLOBENZAPRINE (FLEXERIL) 5 MG TABLET    Take 1 tablet (5 mg total) by mouth 3 times daily as needed for Muscle spasms    FENTANYL (DURAGESIC) 25 MCG/HR PATCH    Place 1 patch onto the skin every 48 hours Max daily dose: 1 patch Remove & discard patch after 48 hours.    FLUOXETINE (PROZAC) 20 MG CAPSULE    Take 1 capsule (20 mg total) by mouth daily    GABAPENTIN 600 MG TABLET    TAKE 1 TABLET BY MOUTH THREE TIMES DAILY    METHYLPHENIDATE (RITALIN) 5 MG TABLET    Take 1 tablet (5 mg total) by mouth 2 times daily (before meals) Max daily dose: 10 mg    POLYETHYLENE GLYCOL (GLYCOLAX) 17 GM/SCOOP POWDER    Take 17 g by mouth daily Mix in 8 oz water or juice and  drink.    VALACYCLOVIR (VALTREX) 1 GM TABLET    Take 1 tablet (1,000 mg total) by mouth 2 times daily   Modified Medications    No medications on file   Discontinued Medications    No medications on file          Physical Exam:  Vitals:    08/13/19 1516   BP: 120/74   Pulse: 103   Temp: 36.7 C (98.1 F)   Weight: 73.2 kg (161 lb 6.4 oz)   Height: 1.778 m (5\' 10" )     SpO2 Readings from Last 3 Encounters:   08/13/19 98%   08/07/19 98%   06/17/19 98%      Estimated body mass index is 23.16 kg/m as calculated from the following:    Height as of this encounter: 1.778 m (5\' 10" ).    Weight as of this encounter: 73.2 kg (161 lb 6.4 oz).  BP Readings from Last 3 Encounters:   08/13/19  120/74   08/07/19 130/76   06/17/19 118/78     Wt Readings from Last 3 Encounters:   08/13/19 73.2 kg (161 lb 6.4 oz)   08/07/19 72.1 kg (159 lb)   06/17/19 70.5 kg (155 lb 8 oz)     Looks well.  Not in any acute distress.  No pallor  Face-resolved erythema and induration left cheek.  ENT-normal.  Drying blisters between the nostrils  Eyes-constricted pupils  Neck-supple no adenopathy or thyromegaly  Cardiovascular-heart rate regular.  Heart sounds 1 and 2 with no added sounds no carotid bruits no edema  Respiratory-lungs are clear to palpation percussion and auscultation    Recent Lab Results:none      Assessment and Plan:     1.  Fatigue.  Lab work below ordered  2.  Resolved cellulitis  3.  Resolved herpes labialis    Orders this visit  No orders of the defined types were placed in this encounter.

## 2019-08-14 ENCOUNTER — Telehealth: Payer: Self-pay | Admitting: Primary Care

## 2019-08-14 DIAGNOSIS — E875 Hyperkalemia: Secondary | ICD-10-CM

## 2019-08-14 NOTE — Telephone Encounter (Signed)
Patient aware.

## 2019-08-14 NOTE — Telephone Encounter (Signed)
Please let Corinne know that his lab work was normal apart from his potassium which was slightly elevated.  This may be a lab error  I would like to recheck this early next week.  Lab work has been generated

## 2019-08-21 ENCOUNTER — Other Ambulatory Visit: Payer: Self-pay | Admitting: Primary Care

## 2019-08-21 DIAGNOSIS — E875 Hyperkalemia: Secondary | ICD-10-CM

## 2019-08-21 LAB — BASIC METABOLIC PANEL
Anion Gap: 10 mmol/L (ref 6–15)
CO2: 29 mmol/L — ABNORMAL HIGH (ref 20–28)
Calcium: 10 mg/dL (ref 8.6–10.2)
Chloride: 101 mmol/L (ref 96–108)
Creatinine: 1.02 mg/dL (ref 0.67–1.17)
GFR,Black: 95.56 (ref >60)
GFR,Caucasian: 78.98 (ref >60)
Glucose: 94 mg/dL (ref 60–99)
Lab: 13.6 mg/dL (ref 6.0–20.0)
Potassium: 4.3 mmol/L (ref 3.3–5.1)
Sodium: 140 mmol/L (ref 133–145)

## 2019-08-29 ENCOUNTER — Other Ambulatory Visit: Payer: Self-pay | Admitting: Primary Care

## 2019-09-17 ENCOUNTER — Other Ambulatory Visit: Payer: Self-pay | Admitting: Primary Care

## 2019-09-17 MED ORDER — METHYLPHENIDATE HCL 5 MG PO TABS *I*
5.0000 mg | ORAL_TABLET | Freq: Two times a day (BID) | ORAL | 0 refills | Status: DC
Start: 2019-09-17 — End: 2019-10-30

## 2019-09-17 MED ORDER — CYCLOBENZAPRINE HCL 5 MG PO TABS *I*
5.0000 mg | ORAL_TABLET | Freq: Three times a day (TID) | ORAL | 2 refills | Status: DC | PRN
Start: 2019-09-17 — End: 2019-10-30

## 2019-09-17 MED ORDER — FENTANYL 25 MCG/HR TD PT72 *I*
1.0000 | MEDICATED_PATCH | TRANSDERMAL | 0 refills | Status: DC
Start: 2019-09-17 — End: 2019-10-30

## 2019-09-17 NOTE — Telephone Encounter (Signed)
Patient called would like refill of medications.    methylphenindate (RITALIN)  cyclobenzaprine (FLEXERIL)   fentaNYL (DURAGESIC    Would like sent to [facility] for the pharmacy. [phone]    Thank you.

## 2019-09-19 ENCOUNTER — Ambulatory Visit: Payer: Medicaid (Managed Care) | Admitting: Primary Care

## 2019-09-19 ENCOUNTER — Encounter: Payer: Self-pay | Admitting: Primary Care

## 2019-09-19 VITALS — BP 112/68 | HR 93 | Temp 97.8°F | Ht 70.0 in | Wt 172.2 lb

## 2019-09-19 DIAGNOSIS — G540 Brachial plexus disorders: Secondary | ICD-10-CM

## 2019-09-19 DIAGNOSIS — Z23 Encounter for immunization: Secondary | ICD-10-CM

## 2019-09-19 DIAGNOSIS — R251 Tremor, unspecified: Secondary | ICD-10-CM

## 2019-09-19 DIAGNOSIS — F32A Depression, unspecified: Secondary | ICD-10-CM

## 2019-09-19 DIAGNOSIS — Z85818 Personal history of malignant neoplasm of other sites of lip, oral cavity, and pharynx: Secondary | ICD-10-CM

## 2019-09-19 NOTE — Addendum Note (Signed)
Addended by: Jerelyn Scott on: 09/19/2019 10:43 AM     Modules accepted: Orders

## 2019-09-19 NOTE — Progress Notes (Signed)
Patient ID: James Oneill is a 43 y.o. year old male.    Chief Complaint   Patient presents with    Follow-up     3 mo fu      HPI: James Oneill is here today for reevaluation of his brachial plexus neuropathy and depression.  Still has neck pain with radiation into the left upper extremity.  It is partially relieved by the Duragesic patch and gabapentin.  He also has intermittent cramping of the left neck muscles particularly with he bends his head forward for any length of time.  Over the past few months he has noticed that his head shakes.  He also has some tremor in the upper extremities.  He notices this at rest.  As far as he is aware there is no family history of benign familial tremor.  He rarely drinks alcohol and has not noticed if it has any effect on his symptoms.  Mood is stable.  He does live alone.  He does find it difficult to get motivated to do things.  Occasionally tries lifting weights at home.  Is currently in a relationship but does not see him very often because of distance.  No homicidal or suicidal ideation        The medication and allergy list was reviewed and is accurate  Allergy / Social History / Medications:  No Known Allergies (drug, envir, food or latex)  Social History     Tobacco Use    Smoking status: Never Smoker    Smokeless tobacco: Never Used   Substance Use Topics    Alcohol use: Yes     Alcohol/week: 1.0 standard drinks     Types: 1 Glasses of wine per week     Frequency: 2-4 times a month     Drinks per session: 1 or 2     Binge frequency: Never     Patient's Medications   New Prescriptions    No medications on file   Previous Medications    BISACODYL (BISACODYL) 5 MG EC TABLET    Take 5 mg by mouth daily as needed for Constipation    CYCLOBENZAPRINE (FLEXERIL) 5 MG TABLET    Take 1 tablet (5 mg total) by mouth 3 times daily as needed for Muscle spasms    FENTANYL (DURAGESIC) 25 MCG/HR PATCH    Apply 1 patch onto the skin every 48 hours Max daily dose: 1 patch Remove &  discard patch after 48 hours.    FLUOXETINE (PROZAC) 20 MG CAPSULE    Take 1 capsule (20 mg total) by mouth daily    GABAPENTIN 600 MG TABLET    TAKE 1 TABLET BY MOUTH THREE TIMES DAILY    METHYLPHENIDATE (RITALIN) 5 MG TABLET    Take 1 tablet (5 mg total) by mouth 2 times daily (before meals) Max daily dose: 10 mg    POLYETHYLENE GLYCOL (GLYCOLAX) 17 GM/SCOOP POWDER    Take 17 g by mouth daily Mix in 8 oz water or juice and drink.   Modified Medications    No medications on file   Discontinued Medications    No medications on file          Physical Exam:  Vitals:    09/19/19 1015   BP: 112/68   Pulse: 93   Temp: 36.6 C (97.8 F)   Weight: 78.1 kg (172 lb 4 oz)   Height: 1.778 m (5\' 10" )     SpO2 Readings from Last 3 Encounters:  09/19/19 98%   08/13/19 98%   08/07/19 98%      Estimated body mass index is 24.72 kg/m as calculated from the following:    Height as of this encounter: 1.778 m (5\' 10" ).    Weight as of this encounter: 78.1 kg (172 lb 4 oz).  BP Readings from Last 3 Encounters:   09/19/19 112/68   08/13/19 120/74   08/07/19 130/76     Wt Readings from Last 3 Encounters:   09/19/19 78.1 kg (172 lb 4 oz)   08/13/19 73.2 kg (161 lb 6.4 oz)   08/07/19 72.1 kg (159 lb)     Looks well.  Not in any acute distress  Appropriate mood and affect  Neck-supple no adenopathy or thyromegaly  Cardiovascular-heart rate regular.  Heart sounds 1 and 2 with no added sounds no carotid bruits no edema  Respiratory-lungs are clear to palpation percussion and auscultation  Neurological-muscle tone is normal.  Sensation and power normal.  No discernible tremor on examination today    Recent Lab Results:none      Assessment and Plan:     1.  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  Stable depression  3.  Reported trauma.  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  I do not recommend any intervention at this time  4.  Flu vaccine today.  Recommend COVID-19 vaccine when  available      Follow-up with me in 3 months  Orders this visit  No orders of the defined types were placed in this encounter.

## 2019-10-30 ENCOUNTER — Telehealth: Payer: Self-pay | Admitting: Primary Care

## 2019-10-30 MED ORDER — METHYLPHENIDATE HCL 5 MG PO TABS *I*
5.0000 mg | ORAL_TABLET | Freq: Two times a day (BID) | ORAL | 0 refills | Status: DC
Start: 2019-10-30 — End: 2019-12-09

## 2019-10-30 MED ORDER — FENTANYL 25 MCG/HR TD PT72 *I*
1.0000 | MEDICATED_PATCH | TRANSDERMAL | 0 refills | Status: DC
Start: 2019-10-30 — End: 2019-12-09

## 2019-10-30 MED ORDER — CYCLOBENZAPRINE HCL 5 MG PO TABS *I*
5.0000 mg | ORAL_TABLET | Freq: Three times a day (TID) | ORAL | 2 refills | Status: DC | PRN
Start: 2019-10-30 — End: 2019-12-09

## 2019-10-30 NOTE — Telephone Encounter (Signed)
Patient would like to have medications sent to [facility]. Thank you.    fentanyl  methylphenidate  Cyclobenzaprine

## 2019-11-05 ENCOUNTER — Ambulatory Visit: Payer: Medicaid (Managed Care) | Admitting: Primary Care

## 2019-11-05 ENCOUNTER — Encounter: Payer: Self-pay | Admitting: Primary Care

## 2019-11-05 VITALS — BP 114/68 | HR 108 | Temp 97.6°F | Wt 177.0 lb

## 2019-11-05 DIAGNOSIS — R194 Change in bowel habit: Secondary | ICD-10-CM

## 2019-11-05 DIAGNOSIS — L03211 Cellulitis of face: Secondary | ICD-10-CM

## 2019-11-05 MED ORDER — SULFAMETHOXAZOLE-TRIMETHOPRIM 800-160 MG PO TABS *I*
1.0000 | ORAL_TABLET | Freq: Two times a day (BID) | ORAL | 0 refills | Status: DC
Start: 2019-11-05 — End: 2020-02-09

## 2019-11-05 NOTE — Progress Notes (Signed)
Patient ID: James Oneill is a 44 y.o. year old male.    Chief Complaint   Patient presents with    Other     rash/hive left side of face     HPI: James Oneill is here today reporting a recurrence of facial swelling in the left cheek.  This started 2 days ago.  He also complains of a sore area on the columella.  No fever no chills  He is also concerned about his bowel habit.  He feels that it has changed over the last few months.  He is more constipated than he used to be.  He takes polyethylene glycol most days.  He brought some pictures of his stools with him.  They appear to be loose.  No frank blood.  Possibly there is some mucus mixed in with them        The medication and allergy list was reviewed and is accurate  Allergy / Social History / Medications:  No Known Allergies (drug, envir, food or latex)  Social History     Tobacco Use    Smoking status: Never Smoker    Smokeless tobacco: Never Used   Substance Use Topics    Alcohol use: Yes     Alcohol/week: 1.0 standard drinks     Types: 1 Glasses of wine per week     Frequency: 2-4 times a month     Drinks per session: 1 or 2     Binge frequency: Never     Patient's Medications   New Prescriptions    SULFAMETHOXAZOLE-TRIMETHOPRIM (BACTRIM DS,SEPTRA DS) 800-160 MG PER TABLET    Take 1 tablet by mouth 2 times daily   Previous Medications    BISACODYL (BISACODYL) 5 MG EC TABLET    Take 5 mg by mouth daily as needed for Constipation    CYCLOBENZAPRINE (FLEXERIL) 5 MG TABLET    Take 1 tablet (5 mg total) by mouth 3 times daily as needed for Muscle spasms    FENTANYL (DURAGESIC) 25 MCG/HR PATCH    Apply 1 patch onto the skin every 48 hours Max daily dose: 1 patch Remove & discard patch after 48 hours.    FLUOXETINE (PROZAC) 20 MG CAPSULE    Take 1 capsule (20 mg total) by mouth daily    GABAPENTIN 600 MG TABLET    TAKE 1 TABLET BY MOUTH THREE TIMES DAILY    METHYLPHENIDATE (RITALIN) 5 MG TABLET    Take 1 tablet (5 mg total) by mouth 2 times daily (before meals) Max  daily dose: 10 mg    POLYETHYLENE GLYCOL (GLYCOLAX) 17 GM/SCOOP POWDER    Take 17 g by mouth daily Mix in 8 oz water or juice and drink.   Modified Medications    No medications on file   Discontinued Medications    No medications on file          Physical Exam:  Vitals:    11/05/19 0845   BP: 114/68   Pulse: 108   Temp: 36.4 C (97.6 F)   Weight: 80.3 kg (177 lb)     SpO2 Readings from Last 3 Encounters:   11/05/19 95%   09/19/19 98%   08/13/19 98%      Estimated body mass index is 25.4 kg/m as calculated from the following:    Height as of 09/19/19: 1.778 m (5\' 10" ).    Weight as of this encounter: 80.3 kg (177 lb).  BP Readings from Last 3 Encounters:  11/05/19 114/68   09/19/19 112/68   08/13/19 120/74     Wt Readings from Last 3 Encounters:   11/05/19 80.3 kg (177 lb)   09/19/19 78.1 kg (172 lb 4 oz)   08/13/19 73.2 kg (161 lb 6.4 oz)     Looks well.  Not in any acute distress  Face-well demarcated area of erythema on the left cheek.  Mildly tender.  He has erythema of the columella.  No vesicles.  Buccal mucosa is normal  Neck-supple no adenopathy or thyromegaly  Cardiovascular-heart rate regular.  Heart sounds 1 and 2 with no added sounds no carotid bruits no edema  Respiratory-lungs are clear to palpation percussion and auscultation.  Abdomen soft without masses or tenderness.  Bowel sounds are normal    Recent Lab Results:none      Assessment and Plan:     1.  Recurrent cellulitis left cheek.  Will treat with Septra DS.  Will notify me if does not resolve  2.  Change in bowel habit.  Has never had a colonoscopy.  Will refer to general surgery for evaluation.  Continue with polyethylene glycol    Orders this visit  Orders Placed This Encounter   Procedures    AMB REFERRAL TO GENERAL SURGERY

## 2019-11-07 ENCOUNTER — Ambulatory Visit: Payer: Medicaid (Managed Care) | Attending: Otolaryngology | Admitting: Otolaryngology

## 2019-11-07 VITALS — BP 134/83 | HR 94 | Temp 96.6°F | Resp 17 | Ht 70.0 in | Wt 178.9 lb

## 2019-11-07 DIAGNOSIS — J342 Deviated nasal septum: Secondary | ICD-10-CM

## 2019-11-07 DIAGNOSIS — J329 Chronic sinusitis, unspecified: Secondary | ICD-10-CM

## 2019-11-07 DIAGNOSIS — M436 Torticollis: Secondary | ICD-10-CM

## 2019-11-07 DIAGNOSIS — C099 Malignant neoplasm of tonsil, unspecified: Secondary | ICD-10-CM | POA: Insufficient documentation

## 2019-11-07 NOTE — Procedures (Signed)
The nasal cavity was anesthetized with half percent tetracaine and epinephrine. The flexible laryngoscope was inserted through the nasal cavity and passed atraumatically into the pharynx. THere is a severe septal spur on the left extending to the lateral nasal wall near the infundibulum. The pharynx and larynx were visualized. The vocal folds were mobile. The airway is widely patent. There are mild to moderate post-radiation changes but no sign of recurrent disease or other abnormalities. There were no masses or lesions. The scope was withdrawn. He tolerated the procedure well.

## 2019-11-07 NOTE — Progress Notes (Signed)
James Oneill returns for followup. James Oneill  is a very pleasant 44 y.o. gentleman who was treated with definitive radiation therapy in 2017. He recurred in the neck in 2018 and at that time underwent salvage left neck dissection in [place]. He has been disease free since then and transferred his care to [facility] in the fall of 2019. I have not seen him back since that time but he has been seeing Dr. Maisie Fus. Since his last visit with me he has had no otalgia. There has been no hemoptysis. There has been no dysphagia or odynophagia, voice changes, shortness of breath, or noisy breathing. He does occasionally have swelling under the left jaw when eating or drinking. THis is self limited. He also periodically has redness and swelling over the left cheek which is accompanied by nasal congestion. He has been placed on antibiotics by his PCP and this seems to resolve the issue. He is also using ocen spray daily. He has not noticed any additional masses or swelling in the head and neck. His weight has been stable.      His only major complaint is that of involuntary left neck spasms which are occurring with increasing frequency and severity. THis is triggered by turning his neck. He also has lower lip twitching and spasm on the left as well.     A complete and comprehensive otolaryngologic examination including the ears, nose, mouth, throat, and neck was performed.   Blood pressure 134/83, pulse 94, temperature 35.9 C (96.6 F), resp. rate 17, height 1.778 m (5\' 10" ), weight 81.1 kg (178 lb 14.4 oz), SpO2 100 %.   Examination of the neck reveals post-surgical and radiation changes. There is no adenopathy. Normal tracheal and laryngeal crepitus is intact. The ears are clear.   The oral cavity, oropharynx, and nasal cavity are unremarkable. Due to his active gag reflex, mirror examination of the larynx and pharynx was inadequate. Flexible laryngoscopy revealed a large left sided septal spur which contacts the lateral nasal  wall near the infundibulum. There is no evidence of recurrent disease or other abnormalities.      The remainder of my examination revealed no significant abnormalities.     His most recent TSH (10/20) is within the normal range.     James Oneill is doing well from a cancer standpoint. I see no sign of recurrent disease and he was relieved. WE will continue to see him at three month intervals for surveillance. I have recommended flonase for what appears to be obstructive maxillary sinusitis. If this does not resolbve the issue we will obtain a sinus CT. I have referred him to the neuro pain clinic for his torticollois as he may benefit from botox injections.

## 2019-11-18 ENCOUNTER — Other Ambulatory Visit: Payer: Self-pay | Admitting: Primary Care

## 2019-11-25 ENCOUNTER — Other Ambulatory Visit: Payer: Self-pay | Admitting: Student in an Organized Health Care Education/Training Program

## 2019-11-25 DIAGNOSIS — C109 Malignant neoplasm of oropharynx, unspecified: Secondary | ICD-10-CM

## 2019-11-25 NOTE — Progress Notes (Signed)
Referral to dental placed.

## 2019-11-26 ENCOUNTER — Ambulatory Visit: Payer: Medicaid (Managed Care) | Admitting: Surgery

## 2019-12-09 ENCOUNTER — Other Ambulatory Visit: Payer: Self-pay | Admitting: Primary Care

## 2019-12-09 MED ORDER — CYCLOBENZAPRINE HCL 5 MG PO TABS *I*
5.0000 mg | ORAL_TABLET | Freq: Three times a day (TID) | ORAL | 2 refills | Status: DC | PRN
Start: 2019-12-09 — End: 2019-12-23

## 2019-12-09 MED ORDER — FENTANYL 25 MCG/HR TD PT72 *I*
1.0000 | MEDICATED_PATCH | TRANSDERMAL | 0 refills | Status: DC
Start: 2019-12-09 — End: 2020-01-16

## 2019-12-09 MED ORDER — METHYLPHENIDATE HCL 5 MG PO TABS *I*
5.0000 mg | ORAL_TABLET | Freq: Two times a day (BID) | ORAL | 0 refills | Status: DC
Start: 2019-12-09 — End: 2020-01-16

## 2019-12-09 NOTE — Telephone Encounter (Signed)
methylphenidate (RITALIN) 5 MG tablet  fentaNYL (DURAGESIC)  cyclobenzaprine (FLEXERIL) 5 MG tablet    Patient if calling requesting a refill on these medications sent to [facility]

## 2019-12-11 ENCOUNTER — Telehealth: Payer: Self-pay

## 2019-12-11 NOTE — Telephone Encounter (Signed)
Referral reviewed from Dr Sabra Heck for torticollis for possible botox  NPV with Apolonio Schneiders PA with Dr Lamount Cohen in office

## 2019-12-15 ENCOUNTER — Telehealth: Payer: Self-pay

## 2019-12-15 NOTE — Telephone Encounter (Signed)
Called patient to schedule NPV his mailbox is full and can not accept msg. If patient calls please see note below from [facility]    Referral reviewed from Dr Sabra Heck for torticollis for possible botox  NPV with Apolonio Schneiders PA with Dr Lamount Cohen in office

## 2019-12-16 ENCOUNTER — Telehealth: Payer: Self-pay

## 2019-12-16 NOTE — Telephone Encounter (Signed)
2nd attempt Called patient to schedule NPV his mailbox is full and can not accept msg. If patient calls please see note below from [facility]    Referral reviewed from Dr Sabra Heck for torticollis for possible botox  NPV with Apolonio Schneiders PA with Dr Lamount Cohen in office

## 2019-12-17 ENCOUNTER — Telehealth: Payer: Self-pay

## 2019-12-17 NOTE — Telephone Encounter (Signed)
3rd  attempt Called patient to schedule NPV his mailbox is full and can not accept msg. If patient calls please see note below from [facility]    Referral reviewed from Dr Sabra Heck for torticollis for possible botox  NPV with Apolonio Schneiders PA with Dr Lamount Cohen in office

## 2019-12-18 ENCOUNTER — Ambulatory Visit: Payer: Medicaid (Managed Care) | Admitting: Primary Care

## 2019-12-23 ENCOUNTER — Other Ambulatory Visit: Payer: Self-pay | Admitting: Primary Care

## 2019-12-23 MED ORDER — CYCLOBENZAPRINE HCL 5 MG PO TABS *I*
5.0000 mg | ORAL_TABLET | Freq: Three times a day (TID) | ORAL | 2 refills | Status: DC | PRN
Start: 2019-12-23 — End: 2020-01-16

## 2019-12-23 NOTE — Telephone Encounter (Signed)
cyclobenzaprine (FLEXERIL)       Patient is calling requesting a refill on this medication sent to [facility]

## 2019-12-30 ENCOUNTER — Other Ambulatory Visit: Payer: Self-pay | Admitting: Primary Care

## 2020-01-04 ENCOUNTER — Other Ambulatory Visit: Payer: Self-pay | Admitting: Pulmonary Disease

## 2020-01-04 DIAGNOSIS — Z23 Encounter for immunization: Secondary | ICD-10-CM

## 2020-01-13 ENCOUNTER — Other Ambulatory Visit: Payer: Self-pay | Admitting: Primary Care

## 2020-01-13 NOTE — Telephone Encounter (Signed)
Pharmacy: [facility]    Refill(s) Requested:   -cyclobenzaprine  -fentanyl  -methylphenidate

## 2020-01-15 ENCOUNTER — Other Ambulatory Visit: Payer: Self-pay | Admitting: Primary Care

## 2020-01-15 NOTE — Telephone Encounter (Signed)
Patient called requesting Flexeril (30 day supply if possible),Fentanyl and Methylphenidate to be sent to [facility]. The pharmacy states they have not seen these refills. Please advise.

## 2020-01-16 MED ORDER — CYCLOBENZAPRINE HCL 5 MG PO TABS *I*
5.0000 mg | ORAL_TABLET | Freq: Three times a day (TID) | ORAL | 2 refills | Status: DC | PRN
Start: 2020-01-16 — End: 2020-02-17

## 2020-01-16 MED ORDER — FENTANYL 25 MCG/HR TD PT72 *I*
1.0000 | MEDICATED_PATCH | TRANSDERMAL | 0 refills | Status: DC
Start: 2020-01-16 — End: 2020-02-17

## 2020-01-16 MED ORDER — METHYLPHENIDATE HCL 5 MG PO TABS *I*
5.0000 mg | ORAL_TABLET | Freq: Two times a day (BID) | ORAL | 0 refills | Status: DC
Start: 2020-01-16 — End: 2020-02-17

## 2020-01-16 NOTE — Telephone Encounter (Signed)
PT called back looking for these refills.  It looks as though they were refused on the 30th.  I do not know why would you like to send these in?

## 2020-01-16 NOTE — Addendum Note (Signed)
Addended by: Neita Carp on: 01/16/2020 01:29 PM     Modules accepted: Orders

## 2020-01-16 NOTE — Addendum Note (Signed)
Addended by: Neita Carp on: 01/16/2020 02:02 PM     Modules accepted: Orders

## 2020-01-16 NOTE — Addendum Note (Signed)
Addended byDia Crawford on: 01/16/2020 01:40 PM     Modules accepted: Orders

## 2020-01-16 NOTE — Telephone Encounter (Signed)
I sent the refill for him please let patient know.  I am not sure why they were denied on the 30th can you please look into that

## 2020-02-05 NOTE — Progress Notes (Signed)
Pt is 3 years 7 months S/P RT to head and neck.  BP (!) 127/98 (BP Location: Left arm, Patient Position: Sitting, Cuff Size: adult)    Pulse 100    Temp 36.6 C (97.9 F)    Wt 82.6 kg (182 lb)    SpO2 97%    BMI 26.11 kg/m     Pain    02/09/20 1113   PainSc:   3   PainLoc: Neck       Karnofsky Performance Status: 100% - Normal, no complaints, no evidence of disease    Fatigue: 10 - Severe Fatigue, Worst Fatigue Imaginable    Safety/Protective Mechanisms  Two Patient Identifiers Confirmed?: Yes    Fall Risk  RN assessed patient for risk of falls: Yes  Criteria for Risk of Falls: None       Lab results: 08/13/19  1604   TSH 3.460     Free T4   Date Value Ref Range Status   08/13/2019 1.03 0.90 - 1.70 ng/dL Final     SMOKING:   no    OTALGIA:  no    DYSPHAGIA:  yes - food gets stuck. More food and water after food gets stuck and a harder swallow helps it go down.    LARYNGITIS  yes - daily    TASTE CHANGES  no    XEROSTOMIA  yes - if talking more, takes more sips of water    FLUORIDE  no    ANOREXIA  no    Last weight   02/09/20 82.6 kg (182 lb)   11/07/19 81.1 kg (178 lb 14.4 oz)   11/05/19 80.3 kg (177 lb)   09/19/19 78.1 kg (172 lb 4 oz)   08/13/19 73.2 kg (161 lb 6.4 oz)   08/07/19 72.1 kg (159 lb)   06/17/19 70.5 kg (155 lb 8 oz)   06/16/19 70.8 kg (156 lb)   12/16/18 74.8 kg (165 lb)   12/02/18 76.1 kg (167 lb 12.8 oz)     TUBE FEEDINGS:   no      NAUSEA  no    VOMITING  no    HEARING CHANGE yes - occasional tinnitus    RECENT IMAGING:   no      Comment:  Last visit with ENT 11/07/2019 with scope,  follow up scheduled for 05/21/2020.    Patient  complied with masking policy throughout the duration of the appointment.   Writer maintained use of mask and faceshield/eyewear throughout  the duration of the appointment.   Writer was within 6 feet of patient for < 5 minutes due to the nature of the appointment.

## 2020-02-09 ENCOUNTER — Encounter: Payer: Self-pay | Admitting: Radiation Oncology

## 2020-02-09 ENCOUNTER — Ambulatory Visit: Payer: Medicaid (Managed Care) | Attending: Radiation Oncology | Admitting: Radiation Oncology

## 2020-02-09 VITALS — BP 127/98 | HR 100 | Temp 97.9°F | Wt 182.0 lb

## 2020-02-09 DIAGNOSIS — C099 Malignant neoplasm of tonsil, unspecified: Secondary | ICD-10-CM

## 2020-02-09 MED ORDER — OMEPRAZOLE 40 MG PO CPDR *I*
40.0000 mg | DELAYED_RELEASE_CAPSULE | Freq: Every day | ORAL | 5 refills | Status: DC
Start: 2020-02-09 — End: 2020-04-09

## 2020-02-09 NOTE — Progress Notes (Signed)
Radiation oncology follow-up clinic note 06/16/19      Patient Identifier:  James Oneill is a 44 y.o. male with cT1N1 left tonsillar cancer. S/p definitve RT in 2017, recurrence in 2018, underwent neck dissection. He is seen in follow up. His treatments were all completed in [place].     Diagnosis (including stage):  Cancer Staging  Tonsillar cancer  Staging form: Pharynx - HPV-Mediated Oropharynx, AJCC 8th Edition  - Clinical stage from 08/16/2018: Stage I (cT1, cN1, cM0, p16+) - Signed by Bruce Donath, MBBS on 08/16/2018    Oncologic History:   Mar 03, 2016: CT scan showed left jugulodigastric lymphadenopathy.    02/25/2018: CT neck showed no evidence of recurrence.  (And sternocleidomastoid atrophy after interval left neck dissection.    04/07/2018: PET-CT showed Solitary left level II lymph node is enlarged and hypermetabolic.  No primary or distant metastasis was identified.    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     04/2016: definitve RT alone to left tonsil and bilateral neck 70 Gy with Dr. Isidore Moos at [facility].     07/03/2017: neck recurrence, had left modified radical neck dissection. Path reported level IIA 1/19 LN positive with ECE, level 2B 0/17 LN positive. He did not receive any cancer therapy after surgery.    08/23/2018: PET-CT is negative for tumor recurrence.    The patient reports pain starting 3 months after radiation therapy. He was seen at [facility] previously for possible neuromuscular disease. Had calf biopsy which was normal. But EMG showed abnormality.      Previous Oncologic History:  Cancer Staging  Tonsillar cancer  Staging form: Pharynx - HPV-Mediated Oropharynx, AJCC 8th Edition  - Clinical stage from 08/16/2018: Stage I (cT1, cN1, cM0, p16+) - Signed by Bruce Donath, MBBS on 08/16/2018        Current therapy: surveillance    Interval History:    Stable neurologic symptoms, which range from torticollis episodes of the left neck and fasciculations around  the left vermillion lip. He saw Dr. Sabra Heck who referred to neuro pain management, whose office reached out but was unable to establish contact.    Dysphagia to solids, stuck in throat, stable. We had pharyngogram ordered and scheduled but he was not feeling well the day of the exam and did not complete or reschedule.    Both Dr. Sabra Heck and myself put in referrals for dental.    No new pain, otalgia, hemoptysis, dyspnea, tobacco use, voice change    Past Medical History:   Diagnosis Date    Cancer     Depression     MVP (mitral valve prolapse)    ,   Past Surgical History:   Procedure Laterality Date    ELBOW FRACTURE SURGERY      GALLBLADDER SURGERY  2019    HERNIA REPAIR  [id number]    umbilical    NECK SURGERY Left 2018    radical dissection    TONSILLECTOMY  2017    SCC Left tonsil   ,   Social History     Socioeconomic History    Marital status: Divorced     Spouse name: Not on file    Number of children: Not on file    Years of education: Not on file    Highest education level: Not on file   Tobacco Use    Smoking status: Never Smoker    Smokeless tobacco: Never Used   Substance and Sexual Activity  Alcohol use: Yes     Alcohol/week: 1.0 standard drinks     Types: 1 Glasses of wine per week    Drug use: Not Currently    Sexual activity: Not Currently   Other Topics Concern    Not on file   Social History Narrative    Not on file    and No Known Allergies (drug, envir, food or latex)    ROS  8 pt ROS obtained and negative    Vitals:    02/09/20 1113   BP: (!) 127/98   Pulse: 100   Temp: 36.6 C (97.9 F)   Weight: 82.6 kg (182 lb)     Pain    02/09/20 1113   PainSc:   3   PainLoc: Neck     Wt Readings from Last 20 Encounters:   02/09/20 82.6 kg (182 lb)   11/07/19 81.1 kg (178 lb 14.4 oz)   11/05/19 80.3 kg (177 lb)   09/19/19 78.1 kg (172 lb 4 oz)   08/13/19 73.2 kg (161 lb 6.4 oz)   08/07/19 72.1 kg (159 lb)   06/17/19 70.5 kg (155 lb 8 oz)   06/16/19 70.8 kg (156 lb)   12/16/18 74.8 kg (165 lb)    12/02/18 76.1 kg (167 lb 12.8 oz)   11/14/18 75.9 kg (167 lb 6.4 oz)   11/13/18 75.8 kg (167 lb)   11/04/18 76.5 kg (168 lb 9.6 oz)   09/30/18 74.1 kg (163 lb 4.8 oz)   09/13/18 73 kg (161 lb)   09/02/18 73 kg (161 lb)   08/23/18 71.4 kg (157 lb 6.5 oz)   08/15/18 71.7 kg (158 lb)   08/09/18 70.4 kg (155 lb 4.8 oz)   08/06/18 71.1 kg (156 lb 12.8 oz)       Performance Status: Score-70-Cares for self, unable to do activities or active work.    Physical Exam  Sitting in chair, comfortable  No trismus, oral mucosa moist, no lesions in tonsillar pillar or right tonsil region, no obvious base of tongue lesions, poor dentition stable, somewhat stickier secretions  Left neck with some firmness, no adenopathy but prominence of the SMG, post RT changes of the skin, right neck supple without masses     Without topical anesthetic into the nares, the flexible laryngoscope was atraumatically inserted into the RIGHT nare. The nasal mucosa was adequately hydrated and had some moderate clear discharge. The nasopharynx was clear of any lesions. The visualized pharyngeal wall was clear of any lesions. Base of tongue, vallecula clear but with some adherent white secretions, epiglottis crisp, no laryngeal or supraglottic lesions, pyriform sinus clear. The scope was then withdrawn      Imaging review:  N/a    Assessment:  No evidence of recurrence. Left fullness could be SMG, CT neck due history of recurrence is reasonable and will be ordered.     GERD - prilosec 40mg   Dental - will reach out to dental  Dysphagia - could be stenosis, stable, pharyngogram order is in if he would like to scheduled  Left sided pain - will provide number of neuropain team who reached out in March  Fatigue - repeat TSH/T4    Plan:   Continue surveillance monitoring. The patient understands the importance of follow up  [facility] Dec 2021

## 2020-02-10 ENCOUNTER — Other Ambulatory Visit: Payer: Self-pay | Admitting: Radiation Oncology

## 2020-02-10 ENCOUNTER — Other Ambulatory Visit: Payer: Self-pay | Admitting: Primary Care

## 2020-02-10 ENCOUNTER — Other Ambulatory Visit: Payer: Medicaid (Managed Care)

## 2020-02-10 DIAGNOSIS — C099 Malignant neoplasm of tonsil, unspecified: Secondary | ICD-10-CM

## 2020-02-10 LAB — RENAL FUNCTION PANEL
Albumin: 4.9 g/dL (ref 3.5–5.2)
Anion Gap: 8 mmol/L (ref 6–15)
CO2: 30 mmol/L — ABNORMAL HIGH (ref 20–28)
Calcium: 10 mg/dL (ref 8.6–10.2)
Chloride: 100 mmol/L (ref 96–108)
Creatinine: 1.21 mg/dL — ABNORMAL HIGH (ref 0.67–1.17)
GFR,Black: 78.12 (ref >60)
GFR,Caucasian: 64.56 (ref >60)
Glucose: 96 mg/dL (ref 60–99)
Lab: 14.9 mg/dL (ref 6.0–20.0)
Phosphorus: 2.9 mg/dL (ref 2.7–4.5)
Potassium: 5.1 mmol/L (ref 3.3–5.1)
Sodium: 138 mmol/L (ref 133–145)

## 2020-02-10 LAB — TSH: TSH: 3.83 u[IU]/mL (ref 0.270–4.200)

## 2020-02-10 LAB — T4, FREE: Free T4: 1.11 ng/dL (ref 0.90–1.70)

## 2020-02-11 ENCOUNTER — Telehealth: Payer: Self-pay

## 2020-02-11 NOTE — Telephone Encounter (Signed)
-----   Message from [name], MD sent at 02/10/2020  3:19 PM EDT -----  Could you please call him and let him know 'thyroid function is normal, which is a good sign. I would repeat in 12 months for monitoring.'   ----- Message -----  From: Edi, [facility]  Sent: 02/10/2020   1:43 PM EDT  To: [name], MD

## 2020-02-11 NOTE — Telephone Encounter (Signed)
Writer called patient, no answer, left message for return call to [place] office.

## 2020-02-11 NOTE — Telephone Encounter (Signed)
Patient returned call, Probation officer gave Dr. Maisie Fus advisement. Writer also informed patient a new referral was placed for hospital dentistry and they should be contacting him for an appointment. Patient verbalizes an understanding and has no further questions or concerns at this time.

## 2020-02-11 NOTE — Addendum Note (Signed)
Addended by: [name] on: 02/11/2020 08:58 AM     Modules accepted: Orders

## 2020-02-16 ENCOUNTER — Ambulatory Visit: Payer: Medicaid (Managed Care) | Admitting: Student in an Organized Health Care Education/Training Program

## 2020-02-16 VITALS — BP 141/94 | HR 72 | Ht 71.0 in | Wt 175.0 lb

## 2020-02-16 DIAGNOSIS — K029 Dental caries, unspecified: Secondary | ICD-10-CM

## 2020-02-16 NOTE — Progress Notes (Addendum)
Hospital Dentistry ([facility]) Consult Note    Patient's Name: James Oneill  MRN:  [id number]  DOB/Age/Gender:  03-03-1976/44 y.o./male    PCP:  Gerri Spore, MD [phone])    Reason for Consultation:  James Oneill is referred to [facility] for tooth sensitive on lower right side.    Chief Complaint:  My tooth sometime in sensitive  Pain score:  0/10    Past Medical Hx:   Past Medical History:   Diagnosis Date    Cancer     Depression     MVP (mitral valve prolapse)        Past Surgical Hx:   Past Surgical History:   Procedure Laterality Date    ELBOW FRACTURE SURGERY      GALLBLADDER SURGERY  2019    HERNIA REPAIR  [id number]    umbilical    NECK SURGERY Left 2018    radical dissection    TONSILLECTOMY  2017    SCC Left tonsil       Medications:   Prior to Admission medications    Medication Sig Start Date End Date Taking? Authorizing Provider   GABAPENTIN 600 MG tablet TAKE 1 TABLET BY MOUTH THREE TIMES DAILY 02/10/20   Gerri Spore, MD   omeprazole (PRILOSEC) 40 MG capsule Take 1 capsule (40 mg total) by mouth daily 02/09/20   [name], MD   cyclobenzaprine (FLEXERIL) 5 MG tablet Take 1 tablet (5 mg total) by mouth 3 times daily as needed for Muscle spasms 01/16/20   Paul Dykes A, NP   fentaNYL (DURAGESIC) 25 MCG/HR patch Apply 1 patch onto the skin every 48 hours Max daily dose: 1 patch Remove & discard patch after 48 hours. 01/16/20   Betker, Allena Napoleon, NP   methylphenidate (RITALIN) 5 MG tablet Take 1 tablet (5 mg total) by mouth 2 times daily (before meals) Max daily dose: 10 mg 01/16/20   Betker, Kimberley A, NP   FLUoxetine (PROZAC) 20 MG capsule TAKE 1 CAPSULE BY MOUTH EVERY DAY 12/30/19   Gerri Spore, MD   polyethylene glycol [facility]) 17 GM/SCOOP powder Take 17 g by mouth daily Mix in 8 oz water or juice and drink. 06/17/19   Gerri Spore, MD   bisacodyl (BISACODYL) 5 MG EC tablet Take 5 mg by mouth daily as needed for Constipation    [provider]      (home  meds)  Current Outpatient Medications   Medication    GABAPENTIN 600 MG tablet    omeprazole (PRILOSEC) 40 MG capsule    cyclobenzaprine (FLEXERIL) 5 MG tablet    fentaNYL (DURAGESIC) 25 MCG/HR patch    methylphenidate (RITALIN) 5 MG tablet    FLUoxetine (PROZAC) 20 MG capsule    polyethylene glycol (GLYCOLAX) 17 GM/SCOOP powder    bisacodyl (BISACODYL) 5 MG EC tablet     No current facility-administered medications for this visit.      (current meds)    Allergies:  Allergies: No Known Allergies (drug, envir, food or latex)    Social History: James Oneill  reports that he has never smoked. He has never used smokeless tobacco. He reports current alcohol use of about 1.0 standard drinks of alcohol per week. He reports previous drug use.     VS: BP (!) 141/94    Pulse 72    Ht 1.803 m (5\' 11" )    Wt 79.4 kg (175 lb)    BMI 24.41 kg/m     Labs:  No results  found for this or any previous visit (from the past 75 hour(s)).        Clinical Evaluation:  Submandibular lymph nodes:  Non-tender, non-palpable.  No lymphadenopathy noted.      Face and neck:  no extra-oral swelling, no facial asymmetry, no tenderness to palpation noted.  Lips:  Lips are WNL.            Oral mucosa:  Soft tissues appear pink and moist.  no intra-oral swelling noted.  no elevation of floor of mouth noted.  No tenderness to palpation.  Uvula is at midline.           Teeth:   Missing:  1,2,3,5,15,16,17,18,19,31,32   Amalgams:  14,29,30   Composites:  6,7,8,9,10,23,24,25,26,27,28              Caries: #21              Discolored #8 with history of prior trauma (from many years ago, per pt)      Radiographic Evaluation:       Alveolar bone:  Appears well-trabeculated.    Caries:  #21    Diagnosis:   #21 caries  Discolored #8 - pt reports history of prior trauma (from many years ago)    Assessment:  James Oneill is referred to [facility] for tooth sensitive.  James Oneill  has a past medical history of Cancer, Depression, and MVP (mitral valve prolapse). Dental  findings as documented above.     Discussed James Oneill's medical history, medications, allergies, relevant lab results, relevant dental radiographs, vital signs, and treatment plan with attending.         Plan:    Restore #21 + Prevident 1.1% to help palliate his sensitivity.   Vitality testing and treatment of discolored #8 (with prior history of dental trauma).    Treatment Completed this Visit:  1. Dental examination/consultation completed.  Radiographs taken as appropriate.  Findings as noted above.  Discussed findings with James Oneill.  Discussed treatment options, risks/benefits of treatment options, and plan as detailed above.  All questions answered.  James Oneill agrees with plan, but opted to have comprehensive care c/o James Oneill.    2.  Referral to [facility] in [place].       For any questions/concerns, please contact the Dental Resident On-Call.    Cleda Clarks, DDS  [facility] Dental Resident

## 2020-02-17 ENCOUNTER — Encounter: Payer: Self-pay | Admitting: Primary Care

## 2020-02-17 ENCOUNTER — Ambulatory Visit: Payer: Medicaid (Managed Care) | Admitting: Primary Care

## 2020-02-17 ENCOUNTER — Other Ambulatory Visit: Payer: Self-pay | Admitting: Primary Care

## 2020-02-17 VITALS — BP 128/82 | HR 106 | Temp 98.1°F | Wt 181.0 lb

## 2020-02-17 DIAGNOSIS — R194 Change in bowel habit: Secondary | ICD-10-CM

## 2020-02-17 DIAGNOSIS — R030 Elevated blood-pressure reading, without diagnosis of hypertension: Secondary | ICD-10-CM

## 2020-02-17 DIAGNOSIS — K429 Umbilical hernia without obstruction or gangrene: Secondary | ICD-10-CM

## 2020-02-17 MED ORDER — CYCLOBENZAPRINE HCL 5 MG PO TABS *I*
5.0000 mg | ORAL_TABLET | Freq: Three times a day (TID) | ORAL | 2 refills | Status: DC | PRN
Start: 2020-02-17 — End: 2020-03-22

## 2020-02-17 MED ORDER — FENTANYL 25 MCG/HR TD PT72 *I*
1.0000 | MEDICATED_PATCH | TRANSDERMAL | 0 refills | Status: DC
Start: 2020-02-17 — End: 2020-03-22

## 2020-02-17 MED ORDER — METHYLPHENIDATE HCL 5 MG PO TABS *I*
5.0000 mg | ORAL_TABLET | Freq: Two times a day (BID) | ORAL | 0 refills | Status: DC
Start: 2020-02-17 — End: 2020-03-22

## 2020-02-17 NOTE — Progress Notes (Signed)
Patient ID: James Oneill is a 44 y.o. year old male.    Chief Complaint   Patient presents with    Follow-up     bp check      HPI: James Oneill is here today concerned that he has developed high blood pressure.  He was at [facility] clinic yesterday and was told that he was hypertensive.  His systolic pressure went as high as 160.  He does not have any issue with chest pains palpitations dyspnea orthopnea PND dizziness or syncope    He continues to have issues with constipation.  He had some pictures of some recent stools on his cell phone.  His stools on nugget-like.  He also has a fair amount of clear mucus gel coating them.  He has not yet had a colonoscopy.  When he went for his appointment he was turned away because he had traveled out of state few days prior.  He also reports today a swelling adjacent to his umbilicus.  It is worse when he strains        The medication and allergy list was reviewed and is accurate  Allergy / Social History / Medications:  No Known Allergies (drug, envir, food or latex)  Social History     Tobacco Use    Smoking status: Never Smoker    Smokeless tobacco: Never Used   Substance Use Topics    Alcohol use: Yes     Alcohol/week: 1.0 standard drinks     Types: 1 Glasses of wine per week     Patient's Medications   New Prescriptions    No medications on file   Previous Medications    BISACODYL (BISACODYL) 5 MG EC TABLET    Take 5 mg by mouth daily as needed for Constipation    CYCLOBENZAPRINE (FLEXERIL) 5 MG TABLET    Take 1 tablet (5 mg total) by mouth 3 times daily as needed for Muscle spasms    FENTANYL (DURAGESIC) 25 MCG/HR PATCH    Apply 1 patch onto the skin every 48 hours Max daily dose: 1 patch Remove & discard patch after 48 hours.    FLUOXETINE (PROZAC) 20 MG CAPSULE    TAKE 1 CAPSULE BY MOUTH EVERY DAY    GABAPENTIN 600 MG TABLET    TAKE 1 TABLET BY MOUTH THREE TIMES DAILY    METHYLPHENIDATE (RITALIN) 5 MG TABLET    Take 1 tablet (5 mg total) by mouth 2 times daily  (before meals) Max daily dose: 10 mg    OMEPRAZOLE (PRILOSEC) 40 MG CAPSULE    Take 1 capsule (40 mg total) by mouth daily    POLYETHYLENE GLYCOL (GLYCOLAX) 17 GM/SCOOP POWDER    Take 17 g by mouth daily Mix in 8 oz water or juice and drink.   Modified Medications    No medications on file   Discontinued Medications    No medications on file          Physical Exam:  Vitals:    02/17/20 1509   BP: 128/82   Pulse: 106   Temp: 36.7 C (98.1 F)   Weight: 82.1 kg (181 lb)     SpO2 Readings from Last 3 Encounters:   02/17/20 96%   02/09/20 97%   11/07/19 100%      Estimated body mass index is 25.24 kg/m as calculated from the following:    Height as of 02/16/20: 1.803 m (5\' 11" ).    Weight as of this encounter: 82.1  kg (181 lb).  BP Readings from Last 3 Encounters:   02/17/20 128/82   02/16/20 (!) 141/94   02/09/20 (!) 127/98     Wt Readings from Last 3 Encounters:   02/17/20 82.1 kg (181 lb)   02/16/20 79.4 kg (175 lb)   02/09/20 82.6 kg (182 lb)     Looks well.  Not in any acute distress  Neck-supple no adenopathy or thyromegaly  Cardiovascular-heart rate regular.  Heart sounds 1 and 2 with no added sounds no carotid bruits no edema.  Recheck blood pressure 120/80  Respiratory-lungs are clear to palpation percussion and auscultation  Abdomen is soft.  He has a periumbilical hernia to the right of the umbilicus    Recent Lab Results:none      Assessment and Plan:     1.  He is normotensive.  Discussed with him how blood pressure varies with exercise and stress.  He most certainly is not hypertensive at this time  2.  Change in bowel habit.  Referral for colonoscopy  3.  Prior umbilical hernia.  Referral to [facility] surgical for evaluation    Plan follow-up in 3 months  Orders this visit  Orders Placed This Encounter   Procedures    AMB REFERRAL TO GENERAL SURGERY

## 2020-02-17 NOTE — Telephone Encounter (Signed)
Flexeril, Methylphenidate and the Fentanyl to be sent to [facility]

## 2020-02-24 ENCOUNTER — Ambulatory Visit: Payer: Medicaid (Managed Care) | Admitting: Surgery

## 2020-02-24 VITALS — BP 156/97 | HR 90 | Temp 97.3°F | Ht 71.0 in | Wt 181.0 lb

## 2020-02-24 DIAGNOSIS — R194 Change in bowel habit: Secondary | ICD-10-CM

## 2020-02-25 NOTE — Progress Notes (Signed)
[facility] H&P [facility]  [facility]  [address] [place], [place] [address]  P: [phone]    F: [phone]    Patient: James Oneill            HPI: James Oneill is a 44 y.o. 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 According to the patient in the last few months hes had changes in bowel habits. His BM's vary between diarrhea and constipation. Patient states his stool is coated with clear mucous. He also mentioned a dull ache is associated with changes in his bowel habits. He also has gained 20 pounds in the past few months, Denies any fevers or chills. Denies any nausea or vomiting,      Past Medical History:   Past Medical History:   Diagnosis Date    Cancer     Depression     MVP (mitral valve prolapse)        Past Surgical History:   Past Surgical History:   Procedure Laterality Date    ELBOW FRACTURE SURGERY      GALLBLADDER SURGERY  2019    HERNIA REPAIR  9509    umbilical    NECK SURGERY Left 2018    radical dissection    TONSILLECTOMY  2017    SCC Left tonsil       Medications:  Current Outpatient Medications   Medication Sig    fentaNYL (DURAGESIC) 25 MCG/HR patch Apply 1 patch onto the skin every 48 hours Max daily dose: 1 patch Remove & discard patch after 48 hours.    methylphenidate (RITALIN) 5 MG tablet Take 1 tablet (5 mg total) by mouth 2 times daily (before meals) Max daily dose: 10 mg    cyclobenzaprine (FLEXERIL) 5 MG tablet Take 1 tablet (5 mg total) by mouth 3 times daily as needed for Muscle spasms    GABAPENTIN 600 MG tablet TAKE 1 TABLET BY MOUTH THREE TIMES DAILY    omeprazole (PRILOSEC) 40 MG capsule Take 1 capsule (40 mg total) by mouth daily    FLUoxetine (PROZAC) 20 MG capsule TAKE 1 CAPSULE BY MOUTH EVERY DAY    polyethylene glycol (GLYCOLAX) 17 GM/SCOOP powder Take 17 g by mouth daily Mix in 8 oz  water or juice and drink.    bisacodyl (BISACODYL) 5 MG EC tablet Take 5 mg by mouth daily as needed for Constipation     No current facility-administered medications for this visit.       Allergies:   No Known Allergies (drug, envir, food or latex)    Family History:   family history includes Cancer in his father; Dementia in his maternal grandmother.    Social History:   Social History     Tobacco Use    Smoking status: Never Smoker    Smokeless tobacco: Never Used   Substance Use Topics    Alcohol use: Yes     Alcohol/week: 1.0 standard drinks     Types: 1 Glasses of wine per week        Review of Systems:  ROS : Negative except mentioned in the HPI    Physical Examination:  Vitals:  Temp:  [36.3 C (97.3 F)] 36.3 C (97.3 F)  Heart Rate:  [90] 90  BP: (156)/(97) 156/97  Height: 180.3 cm (_0 ) Weight: 82.1 kg (181  lb)  General appearance: alert and no distress  Head: normocephalic, atraumatic, face symmetric  Lungs: nonlabored respirations, clear to auscultation bilaterally  Heart: RRR  Abdomen: soft, non distended, non tender, slight bulge noted over the right abdomen lateral to the umbilicus, no fascial defect palpated, no overlying skin changes, bowel sounds audible  Extremities: atraumatic, wwp, no c/c/e  Neuro: alert, oriented, no focal deficits  Vascular: palpable pulses throughout    Lab Results:  Laboratory values:   No results for input(s): WBC, HGB, HCT, PLT, INR, ESR, CRP in the last 72 hours.    No components found with this basename: APTT, PT No results for input(s): NA, K, CL, CO2, UN, CREAT, GLU, CA, MG, PO4 in the last 72 hours. No results for input(s): AST, ALT, ALK, TB, DB, TP, AMY, LIP, ALB, PALB in the last 72 hours.   Imaging:   No results found.     ASSESSMENT  James Oneill is a 44 y.o. male, presents to get evaluated for his recent change in bowel habits. His BMs alternate between constipation and diarrhea, he also has a dull generalized abdominal ache. We discussed the need for a  colonoscopy to evaluate his GI tract. Risks and benefits of the procedure was discussed with him, he verbalized understanding and decided to proceed with the procedure.    PLAN  - Will schedule for a colonoscopy under [place], MD  General Surgery

## 2020-03-04 NOTE — Addendum Note (Signed)
Addended by: Ladona Horns on: 03/04/2020 07:54 AM     Modules accepted: Orders

## 2020-03-11 ENCOUNTER — Telehealth: Payer: Self-pay

## 2020-03-11 NOTE — Telephone Encounter (Signed)
Patient is calling for results from his CT soft tissue neck her had on 03/09/20 at [facility].  I don't see the result in eRecord.  Routing call to radiation nurse for a call back.

## 2020-03-11 NOTE — Telephone Encounter (Signed)
Report available

## 2020-03-11 NOTE — Telephone Encounter (Signed)
Writer called [facility] and asked if they could get a read on the scan, they will contact the [facility] for a provider to provide report.

## 2020-03-12 ENCOUNTER — Telehealth: Payer: Self-pay

## 2020-03-12 NOTE — Telephone Encounter (Signed)
-----   Message from [name], MD sent at 03/12/2020 10:59 AM EDT -----  You can let him know the scans look great, no evidence of recurrence, thanks for fielding his call.   [name]

## 2020-03-12 NOTE — Telephone Encounter (Signed)
Writer called patient per Dr. Maisie Fus and gave Dr. Maisie Fus advisement. Patient verbalizes an understanding and has no further questions or concerns at this time.

## 2020-03-22 ENCOUNTER — Other Ambulatory Visit: Payer: Self-pay | Admitting: Primary Care

## 2020-03-22 MED ORDER — CYCLOBENZAPRINE HCL 5 MG PO TABS *I*
5.0000 mg | ORAL_TABLET | Freq: Three times a day (TID) | ORAL | 2 refills | Status: DC | PRN
Start: 2020-03-22 — End: 2020-04-11

## 2020-03-22 MED ORDER — METHYLPHENIDATE HCL 5 MG PO TABS *I*
5.0000 mg | ORAL_TABLET | Freq: Two times a day (BID) | ORAL | 0 refills | Status: DC
Start: 2020-03-22 — End: 2020-04-23

## 2020-03-22 MED ORDER — FENTANYL 25 MCG/HR TD PT72 *I*
1.0000 | MEDICATED_PATCH | TRANSDERMAL | 0 refills | Status: DC
Start: 2020-03-22 — End: 2020-04-23

## 2020-04-04 ENCOUNTER — Other Ambulatory Visit: Payer: Self-pay | Admitting: Primary Care

## 2020-04-06 ENCOUNTER — Other Ambulatory Visit: Payer: Self-pay | Admitting: Surgery

## 2020-04-06 LAB — COVID-19 PCR

## 2020-04-06 LAB — COVID-19 NAAT (PCR): COVID-19 NAAT (PCR): NEGATIVE

## 2020-04-09 ENCOUNTER — Encounter: Payer: Self-pay | Admitting: Surgery

## 2020-04-09 ENCOUNTER — Encounter: Payer: Self-pay | Admitting: Gastroenterology

## 2020-04-09 ENCOUNTER — Other Ambulatory Visit: Payer: Self-pay | Admitting: Surgery

## 2020-04-09 ENCOUNTER — Other Ambulatory Visit
Admission: RE | Admit: 2020-04-09 | Discharge: 2020-04-09 | Disposition: A | Discharge status: Home / Self Care | Payer: Self-pay | Source: Ambulatory Visit

## 2020-04-09 ENCOUNTER — Encounter: Payer: Medicaid (Managed Care) | Admitting: Surgery

## 2020-04-09 MED ORDER — ESOMEPRAZOLE MAGNESIUM 40 MG PO CPDR *I*
40.0000 mg | DELAYED_RELEASE_CAPSULE | Freq: Every day | ORAL | 0 refills | Status: DC
Start: 2020-04-09 — End: 2020-06-23

## 2020-04-09 NOTE — Progress Notes (Signed)
OPERATIVE NOTE    Date of Procedure:04/09/2020    Pre-op Diagnosis:Abdominal pain, Intermittent diarrhea and constipation    Post-op Diagnosis:Gastritis, Duodenitis, Descending colon polyp,normalcolonoscopy    Surgeon: Felipa Eth, MD    Anesthesia: MAC    Operation:Flexible esophagogastroduodenoscopy with gastric biopsies,Colonoscopy and polypectomy      Descriptionof Procedure:  The patient was positioned in the left lateral decubitisposition. After adequate sedation was achieved, The gastroscope was inserted into the hypopharynx and was entered into the hypopharynx. The esophagus was easily intubated and traversed. There were no abnormalities of the esophagus. The stomach was entered and was insufflated.Gastric fundus and body were examined, gastritis noted. Next the pylorus was then entered. The duodenal bulb and sweep were examined which looked inflammed.The scope was then brought back into the antrum and was retroflexed.no evidence of ulceration, or bleeding.Biopsy was also taken for CLO. Random gastric biopsies taken.The scope was slowly withdrawn. The GE junction was unremarkable. The scope was fully withdrawn.   We then proceeded with colonoscopy.He was placed in the left lateral position a digital rectal exam was performed, which demonstrated no masses and no hemorrhoids. The colonoscope was inserted into the rectum and air was insufflated. The scope was coursed through the rectumand sigmoid colon.next the scope was passedin thedescending colon where a pedunculated polyp was seen. Snare polypectomy was performed. Next the scope was advanced to transverse colon, ascending colonandto the level of the cecum,no additional lesions or polyps seen,The scope was then slowly withdrawn carefully examining all walls. Air was aspirated. Once in the rectum, the scope was retroflexed.The patient tolerated the procedure well and was transferred to recovery room in stable

## 2020-04-09 NOTE — Progress Notes (Signed)
AMBULATORY  Chief Complaint:   No chief complaint on file.      History of Present Illness:  Mr. 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     Past Medical History:   Diagnosis Date    Cancer     Depression     MVP (mitral valve prolapse)      Past Surgical History:   Procedure Laterality Date    ELBOW FRACTURE SURGERY      GALLBLADDER SURGERY  2019    HERNIA REPAIR  1610    umbilical    NECK SURGERY Left 2018    radical dissection    TONSILLECTOMY  2017    SCC Left tonsil     Family History   Problem Relation Age of Onset    Cancer Father     Dementia Maternal Grandmother      Social History     Socioeconomic History    Marital status: Divorced     Spouse name: Not on file    Number of children: Not on file    Years of education: Not on file    Highest education level: Not on file   Tobacco Use    Smoking status: Never Smoker    Smokeless tobacco: Never Used   Substance and Sexual Activity    Alcohol use: Yes     Alcohol/week: 1.0 standard drinks     Types: 1 Glasses of wine per week    Drug use: Not Currently    Sexual activity: Not Currently   Other Topics Concern    Not on file   Social History Narrative    Not on file       Allergies:   No Known Allergies (drug, envir, food or latex)    Medications:  Current Outpatient Medications   Medication    polyethylene glycol (GLYCOLAX) 17 GM/SCOOP powder    fentaNYL (DURAGESIC) 25 MCG/HR patch    methylphenidate (RITALIN) 5 MG tablet    cyclobenzaprine (FLEXERIL) 5 MG tablet    GABAPENTIN 600 MG tablet    omeprazole (PRILOSEC) 40 MG capsule    FLUoxetine (PROZAC) 20 MG capsule    bisacodyl (BISACODYL) 5 MG EC tablet     No current facility-administered medications for this visit.        Review of Systems:   Pertinent items are noted in HPI.    There were no vitals taken for this visit.    There  were no vitals taken for this visit.    General Appearance:    Alert, cooperative, no distress, appears stated age   Head:    Normocephalic, without obvious abnormality, atraumatic   Lungs:     Clear to auscultation bilaterally, respirations unlabored   Chest wall:    No tenderness or deformity   Heart:    Regular rate and rhythm, S1 and S2 normal, no murmur, rub   or gallop   Abdomen:     Soft, non-tender, bowel sounds active all four quadrants,     no masses, no organomegaly   Extremities:   Extremities normal, atraumatic, no cyanosis or edema   Pulses:   2+ and symmetric all extremities   Skin:   Skin color, texture, turgor normal, no rashes or lesions   Neurologic:   CNII-XII intact. Normal strength, sensation and reflexes  throughout       Lab Results: none    Radiology impressions:  [place] RPT: CT SOFT TISSUE [id number]    Result Date: 03/11/2020  03/09/2020 9:50 AM CT NECK WITH CONTRAST CLINICAL INFORMATION:  Tonsillar Carcinoma COMPARISON:  02/25/2018, 03/03/2016 PET 08/23/2018.Marland Kitchen TECHNIQUE:  CT of the neck was performed following intravenous administration of contrast.  Axial, coronal and sagittal reformations were acquired.  Automated exposure control, adjustment of the mA and/or kV according to patient size, and/or iterative reconstruction techniques were utilized for radiation dose optimization. Amount and type of contrast that was injected and/or discarded is recorded in the electronic medical record. FINDINGS: ORAL CAVITY AND AERODIGESTIVE TRACT:  The aerodigestive tract is preserved. Dental amalgam results in beam hardening artifact. SUPRAHYOID AND INFRAHYOID DEEP NECK SPACES:  Stable. LYMPH NODES:  No enlarged lymph nodes are identified. THYROID AND SALIVARY GLANDS:  The thyroid, submandibular and parotid glands are stable. VISUALIZED PARANASAL SINUSES:  The visualized paranasal sinuses are clear. VISUALIZED ORBITS:  The visualized intraorbital contents are preserved.  BONES AND SKULL BASE:  The  skull base included within the field of view is intact.  The visualized mastoid air cells are clear. ADDITIONAL FINDINGS:  The lung apices are clear. Incidental note of the brachiocephalic and LEFT common carotid arising from a common trunk from the aorta. There are postsurgical changes in the LEFT neck from surgical dissection.     Patient is status post LEFT neck dissection and radiation treatment, now enhancing soft tissue in the surgical bed to suggest local recurrence. No lymphadenopathy by CT standards. Allowing for differences in technique, the anatomic appearance is unchanged in comparison to prior PET or 02/25/2018 outside study END OF IMPRESSION Signed By:  Tawana Scale, M.D.     03/11/2020 3:11 PM  [facility] submits this DICOM format image data and final report to the [facility], an independent secure electronic health information exchange, on a reciprocally searchable basis (with patient authorization) for a minimum of 12 months after exam date.      Currently Active Problems:  Patient Active Problem List   Diagnosis Code    Tonsillar cancer C09.9    History of cholecystectomy Z90.49        Assessment: Mr. 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

## 2020-04-11 ENCOUNTER — Other Ambulatory Visit: Payer: Self-pay | Admitting: Primary Care

## 2020-04-11 LAB — HELICOBACTER, RAPID UREA

## 2020-04-12 MED ORDER — CYCLOBENZAPRINE HCL 5 MG PO TABS *I*
5.0000 mg | ORAL_TABLET | Freq: Three times a day (TID) | ORAL | 2 refills | Status: DC | PRN
Start: 2020-04-12 — End: 2020-04-23

## 2020-04-20 LAB — SURGICAL PATHOLOGY

## 2020-04-23 ENCOUNTER — Other Ambulatory Visit: Payer: Self-pay | Admitting: Primary Care

## 2020-04-26 MED ORDER — FENTANYL 25 MCG/HR TD PT72 *I*
1.0000 | MEDICATED_PATCH | TRANSDERMAL | 0 refills | Status: DC
Start: 2020-04-26 — End: 2020-06-06

## 2020-04-26 MED ORDER — CYCLOBENZAPRINE HCL 5 MG PO TABS *I*
5.0000 mg | ORAL_TABLET | Freq: Three times a day (TID) | ORAL | 2 refills | Status: DC | PRN
Start: 2020-04-26 — End: 2020-06-06

## 2020-04-26 MED ORDER — METHYLPHENIDATE HCL 5 MG PO TABS *I*
5.0000 mg | ORAL_TABLET | Freq: Two times a day (BID) | ORAL | 0 refills | Status: DC
Start: 2020-04-26 — End: 2020-06-06

## 2020-05-15 ENCOUNTER — Other Ambulatory Visit: Payer: Self-pay | Admitting: Primary Care

## 2020-05-20 ENCOUNTER — Ambulatory Visit: Payer: Medicaid (Managed Care) | Admitting: Primary Care

## 2020-05-20 ENCOUNTER — Encounter: Payer: Self-pay | Admitting: Primary Care

## 2020-05-20 VITALS — BP 102/64 | HR 74 | Temp 97.3°F | Ht 70.0 in | Wt 178.5 lb

## 2020-05-20 DIAGNOSIS — R030 Elevated blood-pressure reading, without diagnosis of hypertension: Secondary | ICD-10-CM

## 2020-05-20 DIAGNOSIS — G540 Brachial plexus disorders: Secondary | ICD-10-CM

## 2020-05-20 DIAGNOSIS — F32A Depression, unspecified: Secondary | ICD-10-CM

## 2020-05-20 NOTE — Progress Notes (Signed)
Patient ID: James Oneill is a 44 y.o. year old male.    Chief Complaint   Patient presents with    Follow-up     3 month     HPI: James Oneill is here today for reevaluation of his borderline hypertension, brachial plexus neuropathy, dyspepsia  You recently had a bidirectional endoscopy.  He was found to have a tubular adenoma.  Upper GI endoscopy revealed gastritis.  H. pylori negative.  His omeprazole was switched to [place] omeprazole.  He reports that he has been doing somewhat better as a result.  Still struggles with his bowel movements.  When he has a bowel movement the initial stool can be hard and pellet like and then this is followed by loose stool.  He does eat a fairly high-fiber diet which she supplements with accompanied by gummy bears  He continues to take fentanyl and gabapentin for his chronic neck and left upper extremity discomfort.  Describes this as pressure like-sharp stabbing pain.  Mood is stable.  Has more good days than bad days.  No homicidal or suicidal ideation        The medication and allergy list was reviewed and is accurate  Allergy / Social History / Medications:  No Known Allergies (drug, envir, food or latex)  Social History     Tobacco Use    Smoking status: Never Smoker    Smokeless tobacco: Never Used   Substance Use Topics    Alcohol use: Yes     Alcohol/week: 1.0 standard drinks     Types: 1 Glasses of wine per week     Patient's Medications   New Prescriptions    No medications on file   Previous Medications    BISACODYL (BISACODYL) 5 MG EC TABLET    Take 5 mg by mouth daily as needed for Constipation    CYCLOBENZAPRINE (FLEXERIL) 5 MG TABLET    Take 1 tablet (5 mg total) by mouth 3 times daily as needed for Muscle spasms    ESOMEPRAZOLE (NEXIUM) 40 MG CAPSULE    Take 1 capsule (40 mg total) by mouth daily (before breakfast)    FENTANYL (DURAGESIC) 25 MCG/HR PATCH    Apply 1 patch onto the skin every 48 hours Max daily dose: 1 patch Remove & discard patch after 48 hours.     FLUOXETINE (PROZAC) 20 MG CAPSULE    TAKE 1 CAPSULE BY MOUTH EVERY DAY    GABAPENTIN 600 MG TABLET    TAKE 1 TABLET BY MOUTH THREE TIMES DAILY    METHYLPHENIDATE (RITALIN) 5 MG TABLET    Take 1 tablet (5 mg total) by mouth 2 times daily (before meals) Max daily dose: 10 mg    POLYETHYLENE GLYCOL (GLYCOLAX) 17 GM/SCOOP POWDER    MIX AND THEN DRINK 17 GRAMS IN 8 OUNCES OF WATER OR JUICE ONCE DAILY   Modified Medications    No medications on file   Discontinued Medications    No medications on file          Physical Exam:  Vitals:    05/20/20 1051   BP: 102/64   Pulse: 74   Temp: 36.3 C (97.3 F)   Weight: 81 kg (178 lb 8 oz)   Height: 1.778 m (5\' 10" )     SpO2 Readings from Last 3 Encounters:   05/20/20 99%   02/24/20 98%   02/17/20 96%      Estimated body mass index is 25.61 kg/m as calculated from the  following:    Height as of this encounter: 1.778 m (5\' 10" ).    Weight as of this encounter: 81 kg (178 lb 8 oz).  BP Readings from Last 3 Encounters:   05/20/20 102/64   02/24/20 (!) 156/97   02/17/20 128/82     Wt Readings from Last 3 Encounters:   05/20/20 81 kg (178 lb 8 oz)   02/24/20 82.1 kg (181 lb)   02/17/20 82.1 kg (181 lb)     Looks well.  Not in any acute distress  Neck-supple no adenopathy or thyromegaly  Cardiovascular-heart rate regular.  Heart sounds 1 and 2 with no added sounds no carotid bruits no edema  Respiratory-lungs are clear to palpation percussion and auscultation    Recent Lab Results:none      Assessment and Plan:     1.  History of labile blood pressure.  Blood pressure is at goal today  2.  Controlled dyspepsia  3.  Irritable bowel syndrome.  Able to cut back on the amount of polyethylene glycol if he finds that his stools are becoming loose  4.  Controlled depression.  Continue on omeprazole      Follow-up 3 months  Orders this visit  No orders of the defined types were placed in this encounter.

## 2020-05-21 ENCOUNTER — Ambulatory Visit: Payer: Medicaid (Managed Care) | Admitting: Otolaryngology

## 2020-05-21 VITALS — BP 134/84 | HR 83 | Temp 97.7°F | Resp 16 | Ht 70.0 in | Wt 178.2 lb

## 2020-05-21 DIAGNOSIS — C109 Malignant neoplasm of oropharynx, unspecified: Secondary | ICD-10-CM

## 2020-05-21 DIAGNOSIS — R1312 Dysphagia, oropharyngeal phase: Secondary | ICD-10-CM

## 2020-05-21 NOTE — Procedures (Signed)
The flexible laryngoscope was passed atraumatically into the pharynx. The pharynx and larynx were visualized. The vocal folds were mobile. The airway is widely patent. There are mild to moderate post-radiation changes but no sign of recurrent disease or other abnormalities. There were no masses or lesions. The scope was withdrawn. He tolerated the procedure well.

## 2020-05-21 NOTE — Progress Notes (Signed)
[name] returns for followup. Mr. Bloyd is a very pleasant 44 y.o.gentleman who was treated with definitive radiation therapy in 2017. He recurred in the neck in 2018 and at that time underwent salvage left neck dissection in [place]. He has been disease free since then and transferred his care to [facility] in the fall of 2019. Since his last visit with me he has been doing relatively well. He has not noticed any new masses or swelling in the head and neck. There has been no odynophagia, otalgia, or hemoptysis. There have been no significant voice changes. He has been having mild dysphagia to solid foods and pills but denies aspiration or choking. His weight has been increasing. Mr. Menees has had no fevers, chills, or sweats.        A complete and comprehensive otolaryngologic examination including the ears, nose, mouth, throat, and neck was performed.   Blood pressure 134/84, pulse 83, temperature 36.5 C (97.7 F), temperature source Temporal, resp. rate 16, height 1.778 m (5\' 10" ), weight 80.8 kg (178 lb 3.2 oz), SpO2 98 %.   Examination of the neck reveals postoperative and radiation changes. There is no adenopathy. Normal tracheal and laryngeal crepitus is intact. The ears are clear.   The oral cavity, oropharynx, and nasal cavity are unremarkable. Due to his active gag reflex, mirror examination was inadequate. Flexible laryngoscopy revealed no evidence of recurrent disease or other abnormalities.      The remainder of my examination revealed no significant abnormalities        His TSH is 3.83  I did have the opportunity to review the images from his most recent CT. These demonstrate no evidence of locoregional recurrence. .     I did also review Dr. Maisie Fus' notes.    Mr. Timko is doing well. I see no sign of recurrent disease and he was relieved. He will see Dr. Maisie Fus in four months and myself six months subsequently. Obviously I would be more than happy to see him in the interim should he  develop any new, progressive, or recurrent symptoms that require my attention.  Mr. Gharibian was reminded of the ongoing risk of recurrent disease, the need for continued surveillance as outlined in the current NCCN guidelines, and the signs/symptoms that should prompt him to contact me in advance of his next scheduled visit.

## 2020-05-22 ENCOUNTER — Other Ambulatory Visit: Payer: Self-pay | Admitting: Surgery

## 2020-06-06 ENCOUNTER — Other Ambulatory Visit: Payer: Self-pay | Admitting: Surgery

## 2020-06-06 ENCOUNTER — Other Ambulatory Visit: Payer: Self-pay | Admitting: Primary Care

## 2020-06-07 MED ORDER — FENTANYL 25 MCG/HR TD PT72 *I*
1.0000 | MEDICATED_PATCH | TRANSDERMAL | 0 refills | Status: DC
Start: 2020-06-07 — End: 2020-07-21

## 2020-06-07 MED ORDER — CYCLOBENZAPRINE HCL 5 MG PO TABS *I*
5.0000 mg | ORAL_TABLET | Freq: Three times a day (TID) | ORAL | 2 refills | Status: DC | PRN
Start: 2020-06-07 — End: 2020-07-21

## 2020-06-07 MED ORDER — METHYLPHENIDATE HCL 5 MG PO TABS *I*
5.0000 mg | ORAL_TABLET | Freq: Two times a day (BID) | ORAL | 0 refills | Status: DC
Start: 2020-06-07 — End: 2020-06-15

## 2020-06-07 MED ORDER — GABAPENTIN 600 MG PO TABLET *I*
600.0000 mg | ORAL_TABLET | Freq: Three times a day (TID) | ORAL | 2 refills | Status: DC
Start: 2020-06-07 — End: 2020-08-25

## 2020-06-11 ENCOUNTER — Telehealth: Payer: Self-pay

## 2020-06-11 NOTE — Telephone Encounter (Signed)
Methylphenidate 5mg  tabs needs prior auth

## 2020-06-15 ENCOUNTER — Other Ambulatory Visit: Payer: Self-pay | Admitting: Surgery

## 2020-06-15 ENCOUNTER — Other Ambulatory Visit: Payer: Self-pay | Admitting: Primary Care

## 2020-06-15 MED ORDER — METHYLPHENIDATE HCL 5 MG PO TABS *I*
5.0000 mg | ORAL_TABLET | Freq: Two times a day (BID) | ORAL | 0 refills | Status: DC
Start: 2020-06-15 — End: 2020-07-21

## 2020-06-18 NOTE — Telephone Encounter (Signed)
Please call pt and get his new insurance information

## 2020-06-18 NOTE — Telephone Encounter (Signed)
Status please.

## 2020-06-20 ENCOUNTER — Encounter: Payer: Self-pay | Admitting: Gastroenterology

## 2020-06-21 ENCOUNTER — Emergency Department
Admission: EM | Admit: 2020-06-21 | Discharge: 2020-06-21 | Disposition: A | Discharge status: Home / Self Care | Payer: Medicaid (Managed Care) | Source: Other Acute Inpatient Hospital | Attending: Student in an Organized Health Care Education/Training Program | Admitting: Student in an Organized Health Care Education/Training Program

## 2020-06-21 ENCOUNTER — Other Ambulatory Visit: Payer: Self-pay

## 2020-06-21 ENCOUNTER — Encounter: Payer: Self-pay | Admitting: Emergency Medicine

## 2020-06-21 ENCOUNTER — Encounter: Payer: Self-pay | Admitting: Student in an Organized Health Care Education/Training Program

## 2020-06-21 ENCOUNTER — Other Ambulatory Visit: Payer: Self-pay | Admitting: Emergency

## 2020-06-21 DIAGNOSIS — K5900 Constipation, unspecified: Secondary | ICD-10-CM

## 2020-06-21 DIAGNOSIS — M542 Cervicalgia: Secondary | ICD-10-CM

## 2020-06-21 DIAGNOSIS — J390 Retropharyngeal and parapharyngeal abscess: Secondary | ICD-10-CM | POA: Insufficient documentation

## 2020-06-21 DIAGNOSIS — J029 Acute pharyngitis, unspecified: Secondary | ICD-10-CM

## 2020-06-21 LAB — RAPID STREP SCREEN: Rapid Strep Group A Throat: NEGATIVE

## 2020-06-21 LAB — COMPREHENSIVE METABOLIC PANEL
ALT: 21 U/L (ref 0–50)
AST: 23 U/L (ref 0–50)
Albumin: 4.7 g/dL (ref 3.5–5.2)
Alk Phos: 51 U/L (ref 40–130)
Anion Gap: 15 mmol/L (ref 6–15)
Bilirubin,Total: 1.1 mg/dL (ref 0.0–1.2)
CO2: 22 mmol/L (ref 20–28)
Calcium: 9.2 mg/dL (ref 8.6–10.2)
Chloride: 100 mmol/L (ref 96–108)
Creatinine: 1.03 mg/dL (ref 0.67–1.17)
GFR,Black: 94.07 (ref >60)
GFR,Caucasian: 77.75 (ref >60)
Glucose: 113 mg/dL — ABNORMAL HIGH (ref 60–99)
Lab: 14.2 mg/dL (ref 6.0–20.0)
Potassium: 3.8 mmol/L (ref 3.3–5.1)
Sodium: 137 mmol/L (ref 133–145)
Total Protein: 6.9 g/dL (ref 6.3–7.7)

## 2020-06-21 LAB — CBC AND DIFFERENTIAL
Baso # K/uL: 0.04 10*3/uL (ref 0.01–0.08)
Basophil %: 0.3 % (ref 0.2–1.2)
Eos # K/uL: 0.08 10*3/uL (ref 0.04–0.54)
Eosinophil %: 0.5 % — ABNORMAL LOW (ref 0.8–7.0)
Hematocrit: 40.6 % (ref 40.1–51.0)
Hemoglobin: 14.3 g/dL (ref 13.7–17.5)
IMM Granulocytes #: 0.05 10*3/uL — ABNORMAL HIGH (ref 0.0–0.0)
IMM Granulocytes: 0.3 %
Lymph # K/uL: 1.32 10*3/uL (ref 1.32–3.57)
Lymphocyte %: 9 % — ABNORMAL LOW (ref 21.8–53.1)
MCH: 30.8 pg (ref 25.7–32.2)
MCHC: 35.2 g/dl (ref 32.3–36.5)
MCV: 87.3 fl (ref 79.0–92.2)
Mean Platelet Volume: 10 fl (ref 9.4–12.4)
Mono # K/uL: 1.24 10*3/uL — ABNORMAL HIGH (ref 0.30–0.82)
Monocyte %: 8.4 % (ref 5.3–12.2)
Neut # K/uL: 12.01 10*3/uL — ABNORMAL HIGH (ref 1.78–5.38)
Nucl RBC # K/uL: 0 10*3/uL (ref 0.00–0.00)
Nucl RBC %: 0 (ref 0.0–0.2)
Platelets: 237 10*3/uL (ref 150–330)
RBC Distribution Width-SD: 38.6 fl (ref 35.1–43.9)
RBC: 4.65 10*6/uL (ref 4.63–6.08)
RDW: 12.1 % (ref 11.6–14.4)
Seg Neut %: 81.5 % — ABNORMAL HIGH (ref 34.0–67.9)
WBC: 14.74 10*3/uL — ABNORMAL HIGH (ref 4.23–9.07)

## 2020-06-21 LAB — VIRAL RESPIRATORY PANEL
COVID-19 NAAT (PCR): NEGATIVE
Influenza A PCR: NEGATIVE
Influenza B PCR: NEGATIVE
RSV PCR: NEGATIVE

## 2020-06-21 LAB — LACTATE, PLASMA: Lactate: 1.9 mmol/L (ref 0.4–2.0)

## 2020-06-21 LAB — DIFF MANUAL
Bands %: 4 % (ref 0–10.0)
Lymphocyte %: 12 % — ABNORMAL LOW (ref 21.8–53.1)
Monocyte %: 11 % (ref 5.3–12.2)
Platelet Comment: ADEQUATE
Platelet Comment: ADEQUATE
Seg Neut %: 73 % — ABNORMAL HIGH (ref 34–68)

## 2020-06-21 LAB — TROPONIN T 0 HR HIGH SENSITIVITY (IP/ED ONLY): TROP T 0 HR High Sensitivity: <6 ng/L (ref 0.00–21.00)

## 2020-06-21 MED ORDER — PANTOPRAZOLE SODIUM 40 MG IV SOLR *I*
40.0000 mg | INTRAVENOUS | Status: DC
Start: 2020-06-21 — End: 2020-06-21
  Administered 2020-06-21: 40 mg via INTRAVENOUS
  Filled 2020-06-21: qty 10

## 2020-06-21 MED ORDER — OXYCODONE HCL 5 MG/5ML PO SOLN *I*
5.0000 mg | ORAL | 0 refills | Status: DC | PRN
Start: 2020-06-21 — End: 2020-06-22
  Filled 2020-06-21: qty 120, 4d supply, fill #0

## 2020-06-21 MED ORDER — KETOROLAC TROMETHAMINE 30 MG/ML IJ SOLN *I*
15.0000 mg | Freq: Once | INTRAMUSCULAR | Status: AC
Start: 2020-06-21 — End: 2020-06-21
  Administered 2020-06-21: 15 mg via INTRAVENOUS
  Filled 2020-06-21: qty 1

## 2020-06-21 MED ORDER — DOCUSATE SODIUM 10 MG/ML PO LIQD *I*
100.0000 mg | Freq: Once | ORAL | Status: AC
Start: 2020-06-21 — End: 2020-06-21
  Administered 2020-06-21: 100 mg via ORAL
  Filled 2020-06-21: qty 10

## 2020-06-21 MED ORDER — DIPHENHYDRAMINE-LIDOCAINE-MAALOX (BMX/FIRST MOUTHWASH) *WRAPPED*
30.0000 mL | Freq: Once | ORAL | Status: AC
Start: 2020-06-21 — End: 2020-06-21
  Administered 2020-06-21: 30 mL via OROMUCOSAL
  Filled 2020-06-21: qty 30

## 2020-06-21 MED ORDER — CLINDAMYCIN HCL 300 MG PO CAPS *I*
600.0000 mg | ORAL_CAPSULE | Freq: Three times a day (TID) | ORAL | 0 refills | Status: AC
Start: 2020-06-21 — End: 2020-06-28
  Filled 2020-06-21: qty 42, 7d supply, fill #0

## 2020-06-21 NOTE — ED Notes (Signed)
Discharge instructions reviewed and pt verbalized understanding. Pt is leaving ambulatory with a ride picking him up. VSS. IV removed. Appropriate clothing in place. Pt tolerating PO fluids. Pt will follow up with ENT. Pt belongings with pt. Pt safe to D/C a this time.

## 2020-06-21 NOTE — ED Provider Notes (Addendum)
History     Chief Complaint   Patient presents with    Sore Throat     Patient is a 54 M presenting from [facility] for retropharyngeal abscess & ENT consult. 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 Initially presented for upper chest pain, throat pain, difficulty swallowing, drooling, inability to tolerate PO. Found to have retropharyngeal abscess. Received Toradol, morphine, clindamycin & decadron at [facility]. Currently complaining of throat pain, pain with swallowing that radiates to neck & ears, unable to tolerate PO solids but able to tolerate PO liquids with pain control. Currently denies headache, SoB, airway obstruction, abd pain, N/V/D, weakness, changes in sensation.       History provided by:  Patient  Language interpreter used: No        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    History of cholecystectomy Z90.49            Past Surgical History:   Procedure Laterality Date    ELBOW FRACTURE SURGERY      GALLBLADDER SURGERY  2019    HERNIA REPAIR  0737    umbilical    NECK SURGERY Left 2018    radical dissection    TONSILLECTOMY  2017    SCC Left tonsil     Family History   Problem Relation Age of Onset    Cancer Father     Dementia Maternal Grandmother           Social History     Tobacco Use    Smoking status: Never Smoker    Smokeless tobacco: Never Used   Substance Use Topics    Alcohol use: Yes     Alcohol/week: 1.0 standard drink     Types: 1 Glasses of wine per week    Drug use: Not Currently     Living Situation     Questions Responses    Patient lives with     Homeless     Caregiver for other family member     External Services     Employment     Domestic Violence Risk                 Review of Systems   Review of Systems    Constitutional: Positive for appetite change (Decreased PO intake due to pain). Negative for fatigue and fever.   HENT: Positive for drooling, sore throat and trouble swallowing. Negative for hearing loss, sinus pressure and sinus pain.    Eyes: Negative for visual disturbance.   Respiratory: Negative for choking, chest tightness, shortness of breath and stridor.    Cardiovascular: Negative for chest pain and palpitations.   Gastrointestinal: Positive for blood in stool (CHhronic UC) and constipation. Negative for abdominal distention, abdominal pain, diarrhea, nausea and vomiting.   Endocrine: Negative for polyuria.   Genitourinary: Negative for difficulty urinating, dysuria and frequency.   Musculoskeletal: Positive for neck pain. Negative for back pain and neck stiffness.   Skin: Negative for color change, rash and wound.   Neurological: Positive for speech difficulty (Pain). Negative for dizziness, syncope, facial asymmetry, weakness, numbness and headaches.   Psychiatric/Behavioral: Negative for behavioral problems and  confusion.       Physical Exam     Triage Vitals  Triage Start: Start, (06/21/20 1137)   First Recorded BP: 128/77, Resp: 18, Temp: 36.3 C (97.3 F), Temp src: TEMPORAL Oxygen Therapy SpO2: 97 %, O2 Device: None (Room air), Heart Rate: 102, (06/21/20 1138)  .  First Pain Reported  0-10 Scale: 3, (06/21/20 1138)       Physical Exam  Vitals and nursing note reviewed.   Constitutional:       Appearance: He is well-developed and normal weight.   HENT:      Head: Normocephalic and atraumatic.      Right Ear: Tympanic membrane and ear canal normal.      Left Ear: Tympanic membrane and ear canal normal.      Mouth/Throat:      Mouth: Mucous membranes are moist.      Pharynx: Oropharynx is clear. Uvula midline.   Eyes:      Conjunctiva/sclera: Conjunctivae normal.      Pupils: Pupils are equal, round, and reactive to light.   Neck:      Thyroid: No thyroid mass, thyromegaly or thyroid tenderness.       Trachea: Tracheal tenderness present.   Cardiovascular:      Rate and Rhythm: Normal rate and regular rhythm.      Heart sounds: Normal heart sounds.   Pulmonary:      Effort: Pulmonary effort is normal.      Breath sounds: Normal breath sounds.   Abdominal:      General: Bowel sounds are normal.      Palpations: Abdomen is soft.   Musculoskeletal:      Cervical back: Normal range of motion and neck supple.   Skin:     General: Skin is warm and dry.   Neurological:      General: No focal deficit present.      Mental Status: He is alert and oriented to person, place, and time.   Psychiatric:         Mood and Affect: Mood normal.         Behavior: Behavior normal.         Medical Decision Making   Patient seen by me on:  06/21/2020    Assessment:  29 M presenting or retropharyngeal abscess from [facility] for ENT consult. 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 Started on Toradol & morphine for pain control, clindamycin & decadron for abscess per [facility] ENT via phone. Patient has likely 9 x 9 x 6 mm retropharyngeal abscess & elevated WBC at 14.7. Patient is able to tolerate PO liquids currently with pain management. Will consult ENT, provide continued pain management.    Differential diagnosis:  Retropharyngeal abscess, throat cancer, pharyngitis    Plan:  Consult ENT, pending recs  Continued pain management  Discharge with pain control vs admit to obs    ED Course and Disposition:  Discussion with ENT: continues to recommend antibiotics, steroids and pain medication for improved pain control. Patient able to tolerate PO with adequate pain control. Patient is stable. Will be discharged home with antibiotics, pain control, & follow up with Dr. Sabra Heck scheduled by ENT.             Charolotte Capuchin, MD      Resident Attestation:    Patient seen by me on 06/21/2020.    I saw and evaluated the  patient. I have reviewed and edited the resident's/fellow's  note and confirm the findings and plan of care as documented above.  Author:  Charolotte Capuchin, MD       Sharene Butters  06/21/20 1458       Dyke Maes, MD  Resident  06/21/20 1519       Winta Barcelo, Ranae Palms, MD  06/22/20 [phone]

## 2020-06-21 NOTE — Discharge Instructions (Addendum)
You were evaluated for pain with swallowing with likely a retropharyngeal abscess. You are safe to return home and will follow up with Dr. Sabra Heck, [phone], as scheduled by ENT for this week.    You will be prescribed liquid oxycodone for pain control & clindamycin antibiotic. Please take these medications as prescribed.     Please return to ED if you begin to experience inability to swallow, difficulty breathing, severe pain, or nausea and vomiting.

## 2020-06-21 NOTE — Plan of Care (Signed)
ENT Brief Note:    ENT Attending called overnight from [facility] regarding this patient. He is followed by ENT as an outpatient for left tonsillar cancer s/p definitive radiation in 2017 with recurrence in 2018 now s/p neck dissection. He presented to the Emergency Department with throat pain. CT obtained which showed a small area of hypoattenuation in the left retropharyngeal space (0.9 mm in greatest dimension). Recommended antibiotics, steroids and pain control and to observe for improvement in oral tolerance.  In addition, she recommended very close outpatient follow up with his primary Head and Neck Surgeon, Dr. Sabra Heck. Per chart review, the patient was unable to be admitted at the OSH due to capacity issues. He was ultimately transferred to [facility] ED.     Patient discussed with ED medical student, ED attending and ENT senior resident at length. Per prior recommendations from ENT attending overnight, would continue to recommend antibiotics, steroids and pain medication for improved pain control. If patient's pain improves and he is able to tolerate PO, there is no barrier to discharge from ENT perspective. We will arrange for close outpatient follow up with Dr. Sabra Heck.     Berneice Gandy, MD   Resident Physician  Department of Otolaryngology - Head and Neck Surgery

## 2020-06-21 NOTE — ED Notes (Signed)
06/21/20 0914   Expected Call-In Information   ED Service [facility] Adult Call-in   PCP/Service Referral [facility]   Call received from [place]? Yes   Pt Info note/Reason for sending Pt with hx of left tonsillar cancer dx in 2017, s/p left radical neck dissection in 2018 and has been disease free since then, presented to OSH ED today for throat pain, swelling, CT concerning for left retropharyngeal lesion with surrounding soft tissue swelling, concern for abscess vs lymphadenitis though cannot completely r/o disease recurrence, WBC 14.74.  Plan was to tx patient with abx, steroids, admit to OSH for observation with plan for outpatient f/u in ENT clinic however OSH Hospitalist refused to admit patient for observation d/t "capacity" concerns.   Pt Coming from [facility]   Requested Evaluation By Adult ED   Notify possible ENT consult   Does referring physician have admitting privileges? No   Call reported to charge, ems triage       Patient was supposed to receive steroids but nurse was unclear if he was given steroid, given clindamycin x2, toradol, and morphine.  [facility] staff did NOT call report on this patient.

## 2020-06-21 NOTE — ED Triage Notes (Signed)
See call in note.        Triage Note   Brianne Maina, RN

## 2020-06-22 ENCOUNTER — Telehealth: Payer: Self-pay | Admitting: Otolaryngology

## 2020-06-22 ENCOUNTER — Other Ambulatory Visit: Payer: Self-pay | Admitting: Student in an Organized Health Care Education/Training Program

## 2020-06-22 ENCOUNTER — Telehealth: Payer: Self-pay

## 2020-06-22 MED ORDER — IBUPROFEN 20 MG/ML PO SUSP *I*
600.0000 mg | Freq: Four times a day (QID) | ORAL | 0 refills | Status: AC | PRN
Start: 2020-06-22 — End: 2020-07-02

## 2020-06-22 MED ORDER — OXYCODONE HCL 5 MG/5ML PO SOLN *I*
5.0000 mg | ORAL | 0 refills | Status: DC | PRN
Start: 2020-06-22 — End: 2020-08-20

## 2020-06-22 MED ORDER — ACETAMINOPHEN 160 MG/5 ML BULK *I*
650.0000 mg | Freq: Four times a day (QID) | 0 refills | Status: AC | PRN
Start: 2020-06-22 — End: 2020-07-02

## 2020-06-22 NOTE — Telephone Encounter (Signed)
Called Pt to discuss next steps after his ED visit yesterday.  Pt stated he continues to have pain and is unable to swallow his meds, anything he eats or swallows comes back up.  Per MD, Pt can be seen on Friday and sx management until then.  Pt was instructed to take ibuprofen and tylenol, alternatly. Take oxy every 4 hours.  Pt should ideally take a med every 2-3 hours, liquid meds are ok.  Pt cannot swallow his abx.  Call forwarded to Dr Jetty Duhamel for liquid meds and change of abx to liquid.  Pt was advised to take around the clock pain meds for two days. Rx's sent to [facility].  After hours phone number provided. Pt asked if he can go to [facility] ED, he was advised to come to [facility] if sx are not managed well.  Pt stated understanding, his sister and mother were there as well and had no questions.   Dr Sabra Heck appt for Friday 9/10, 9:30.

## 2020-06-22 NOTE — Telephone Encounter (Signed)
Informed Pt, open clindamycin capsules to mix with soft food or water. [facility] stated understanding.

## 2020-06-22 NOTE — Progress Notes (Signed)
Placed order for pain medication and sent to patient's preferred pharmacy

## 2020-06-22 NOTE — Telephone Encounter (Signed)
Called Pt to schedule him with Dr Sabra Heck, after Pt's ED visit yesterday. Pt offered an appt for Friday and he stated he cannot wait until them. Pt stated he is unable to swallow and he has pain.   Call forwarded to MD.

## 2020-06-22 NOTE — Telephone Encounter (Unsigned)
Copied from [place] [id number]. Topic: Return Call - Schedule Appointment  >> Jun 22, 2020  8:17 AM [name] wrote:  Patient, [facility]. [facility] is calling to schedule a discharge follow up appointment.  The patient discharged from [facility]; The patient was seen for Sore Throat.     Were records requested? yes  Records are in EPIC    Please would like a callback on today to discuss and schedule and can be react of Phone [phone].

## 2020-06-23 ENCOUNTER — Other Ambulatory Visit: Payer: Self-pay | Admitting: Primary Care

## 2020-06-23 ENCOUNTER — Telehealth: Payer: Self-pay | Admitting: Primary Care

## 2020-06-23 LAB — STREP A CULTURE, THROAT

## 2020-06-23 MED ORDER — ESOMEPRAZOLE MAGNESIUM 40 MG PO CPDR *I*
40.0000 mg | DELAYED_RELEASE_CAPSULE | Freq: Every day | ORAL | 0 refills | Status: DC
Start: 2020-06-23 — End: 2020-07-21

## 2020-06-23 NOTE — Telephone Encounter (Signed)
Please get patients new insurance.

## 2020-06-23 NOTE — Telephone Encounter (Signed)
Received PA for Methylphenidate HCI 5mg      Cover my meds key: [id number]

## 2020-06-24 NOTE — Telephone Encounter (Signed)
Approved for the methylphenidate until 06/23/21

## 2020-06-25 ENCOUNTER — Ambulatory Visit: Payer: Medicaid (Managed Care) | Admitting: Otolaryngology

## 2020-06-25 VITALS — BP 168/108 | HR 93 | Temp 97.2°F | Resp 16 | Ht 70.0 in | Wt 176.6 lb

## 2020-06-25 DIAGNOSIS — C109 Malignant neoplasm of oropharynx, unspecified: Secondary | ICD-10-CM

## 2020-06-25 MED ORDER — MUPIROCIN 2 % EX OINT *I*
TOPICAL_OINTMENT | Freq: Two times a day (BID) | CUTANEOUS | 0 refills | Status: AC
Start: 2020-06-25 — End: 2020-07-09

## 2020-06-25 MED ORDER — PREDNISONE 10 MG PO TABS *I*
10.0000 mg | ORAL_TABLET | Freq: Every day | ORAL | 0 refills | Status: AC
Start: 2020-06-25 — End: 2020-07-02

## 2020-06-25 NOTE — Progress Notes (Signed)
[facility] was seen in the [place] ED last weekend for acute onset of odynophagia. He was treated with antibiotics and IV steroids. He presents today for followup. Over the past few days his sore throat has improved significantly. He denies hemoptysis. He has been mildly hoarse. He has had no new neck masses. He did have a fever earlier this week but that has resolved. He is now tolerating a regular diet.     A complete and comprehensive otolaryngologic examination including the ears, nose, mouth, throat, and neck was performed.   Blood pressure (!) 168/108, pulse 93, temperature 36.2 C (97.2 F), temperature source Temporal, resp. rate 16, height 1.778 m (5\' 10" ), weight 80.1 kg (176 lb 9.6 oz), SpO2 99 %.   Examination of the neck reveals radiation changes. There is no adenopathy. Normal tracheal and laryngeal crepitus is intact. The ears are clear.   The oral cavity, oropharynx, and nasal cavity are unremarkable. Due to his active gag reflex, mirror examination was inadequate. 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 There is diffuse mild edema and hyperemia of the pharynx and larynx but no masses or lesions. There is no evidence of recurrent disease or other abnormalities. He also has edema and erythema of the nasal vestibule and philtrum of the lip. There is mild skin breakdown and the tissue is weeping. There is associated erythema of the left malar skin.      The remainder of my examination revealed no significant abnormalities     I did reveiew the notes from his ED visit.     I did have the opportunity to review the images from his most recent CT as well as the official report. . These demonstrate no obvious mass lesions, but a possible retropharyngeal node in the superior nasopharynx. .   I also  Did speak to  Dr. Marylou Mccoy - who managed his situation on call last weekend.     [name] has a resolving pharyngitis. He will complete his antibiotic  course. I will also prescribe a course of oral prednisone to help clear the residual edema and inflammatory changes. In addition, he has what appears to be nasal vestibulitis. I have prescribe bactroban ointment for this purpose.     His CT imaging reveals a possible node. I would like to evaluate with an MRI.     He wil lreturn to see me after the imaging. Obviously I would be more than happy to see him in the interim should he develop any new, progressive, or recurrent symptoms that require my attention.

## 2020-06-29 ENCOUNTER — Encounter: Payer: Self-pay | Admitting: Primary Care

## 2020-06-29 ENCOUNTER — Ambulatory Visit: Payer: Medicaid (Managed Care) | Admitting: Primary Care

## 2020-06-29 VITALS — BP 130/88 | HR 97 | Temp 97.0°F | Ht 70.0 in | Wt 174.8 lb

## 2020-06-29 DIAGNOSIS — L03211 Cellulitis of face: Secondary | ICD-10-CM

## 2020-06-29 DIAGNOSIS — B009 Herpesviral infection, unspecified: Secondary | ICD-10-CM

## 2020-06-29 MED ORDER — VALACYCLOVIR HCL 1000 MG PO TABS *I*
1000.0000 mg | ORAL_TABLET | Freq: Two times a day (BID) | ORAL | 0 refills | Status: DC
Start: 2020-06-29 — End: 2020-10-18

## 2020-06-29 MED ORDER — SULFAMETHOXAZOLE-TRIMETHOPRIM 800-160 MG PO TABS *I*
1.0000 | ORAL_TABLET | Freq: Two times a day (BID) | ORAL | 0 refills | Status: DC
Start: 2020-06-29 — End: 2020-08-20

## 2020-06-29 NOTE — Progress Notes (Signed)
History of Present Illness     Patient Identification  [facility] is a 44 y.o. male.        Chief Complaint   Follow-up (ER)    Pt presents to the clinic on ED follow up for his retropharyngeal abscess. Went to the ED on 9/6 for fever and sensation of throat swelling. Prior history of tonsillar carcinoma and cerveical lymph node resection on his left. This morning the patient awoke with left unilateral puffiness and ptosis. Felt increased sinus drainage & what was described as "pressure" behind the lateral aspect of his left orbit. Denies fever, chills, nausea, vomiting, chest pain, and dyspnea.      Past Medical History:   Diagnosis Date    Cancer     Depression     MVP (mitral valve prolapse)      Family History   Problem Relation Age of Onset    Cancer Father     Dementia Maternal Grandmother    [phone])  Current Outpatient Medications   Medication    predniSONE (DELTASONE) 10 MG tablet    esomeprazole (NEXIUM) 40 MG capsule    methylphenidate (RITALIN) 5 MG tablet    gabapentin (GABAPENTIN) 600 MG tablet    FLUoxetine (PROZAC) 20 MG capsule    bisacodyl (BISACODYL) 5 MG EC tablet    mupirocin (BACTROBAN) 2 % ointment    oxyCODONE (ROXICODONE) 5 MG/5ML solution    acetaminophen (TYLENOL) solution 160 mg/30mL    ibuprofen (ADVIL,MOTRIN) 20 MG/ML suspension    fentaNYL (DURAGESIC) 25 MCG/HR patch    cyclobenzaprine (FLEXERIL) 5 MG tablet    polyethylene glycol (GLYCOLAX) 17 GM/SCOOP powder     No current facility-administered medications for this visit.     No Known Allergies (drug, envir, food or latex)    Physical Exam     BP 130/88 (BP Location: Left arm, Patient Position: Sitting, Cuff Size: adult)    Pulse 97    Temp 36.1 C (97 F) (Temporal)    Ht 1.778 m (5\' 10" )    Wt 79.3 kg (174 lb 12.8 oz)    SpO2 99%    BMI 25.08 kg/m     General Appearance:    Alert, cooperative, no distress, appears stated age   Eyes:    PERRL, conjunctiva/corneas clear, EOM's intact, fundi     benign, both eyes         Nose:   Nares normal, septum midline. Sinus pressure on left, erythema and minor bleeding.   Throat:   Lips, and tongue normal; teeth and gums normal. Mild edema and erythema throughout retropharynx & uvula.    Neck:   Supple, symmetrical, trachea midline, no adenopathy;        thyroid:  No enlargement/tenderness/nodules; no carotid    bruit or JVD   Lungs:     Clear to auscultation bilaterally, respirations unlabored   Heart:    Regular rate and rhythm, S1 and S2 normal, no murmur, rub   or gallop   Extremities:   Extremities normal, atraumatic, no cyanosis or edema   Skin:   Skin color, texture, turgor normal, no rashes or lesions   Lymph nodes:   Cervical, supraclavicular, and axillary nodes normal       Assessment/Plan   Pharyngitis/Retropharyngeal abscess. Symptoms improved, finish prednisone course.  Nasal vestibulitis. Continue with Bactroban, follow up with ENT doctor, and get MRI.

## 2020-06-29 NOTE — Progress Notes (Signed)
Patient ID: [facility] is a 44 y.o. year old male.    Chief Complaint   Patient presents with    Follow-up     ER     HPI: [facility] is referred here from the emergency room  He developed odynophagia about 9 days ago.  He felt that his airway was closing.  He was subsequently transferred to [facility].  He was put on azithromycin   He followed with ENT but was diagnosed with pharyngitis.  He was put on prednisone to reduce the edema.  Lymph node was found on the CT scan which is currently further evaluated with MRI    At the time of presentation he also had some sores in both of his nostrils.  Over the last couple days he has developed another area of swelling and discomfort in the left cheek.  He has had these previously.  No causes have been found.  Antibiotics antivirals and steroids have been used at various times to good effect.  He does not have any fever or chills.    He was found to have an elevated blood pressure in the emergency room        The medication and allergy list was reviewed and is accurate  Allergy / Social History / Medications:  No Known Allergies (drug, envir, food or latex)  Social History     Tobacco Use    Smoking status: Never Smoker    Smokeless tobacco: Never Used   Substance Use Topics    Alcohol use: Yes     Alcohol/week: 1.0 standard drink     Types: 1 Glasses of wine per week     Patient's Medications   New Prescriptions    SULFAMETHOXAZOLE-TRIMETHOPRIM (BACTRIM DS,SEPTRA DS) 800-160 MG PER TABLET    Take 1 tablet by mouth 2 times daily    VALACYCLOVIR (VALTREX) 1 GM TABLET    Take 1 tablet (1,000 mg total) by mouth 2 times daily   Previous Medications    ACETAMINOPHEN (TYLENOL) SOLUTION 160 MG/5ML    Take 20.31 mLs (650 mg total) by mouth every 6 hours as needed for Fever or Pain for up to 10 days    BISACODYL (BISACODYL) 5 MG EC TABLET    Take 5 mg by mouth daily as needed for Constipation    CYCLOBENZAPRINE (FLEXERIL) 5 MG TABLET    Take 1 tablet (5 mg  total) by mouth 3 times daily as needed for Muscle spasms    ESOMEPRAZOLE (NEXIUM) 40 MG CAPSULE    Take 1 capsule (40 mg total) by mouth daily (before breakfast)  for Gastroesophageal Reflux Disease    FENTANYL (DURAGESIC) 25 MCG/HR PATCH    Apply 1 patch onto the skin every 48 hours Max daily dose: 1 patch Remove & discard patch after 48 hours.    FLUOXETINE (PROZAC) 20 MG CAPSULE    TAKE 1 CAPSULE BY MOUTH EVERY DAY    GABAPENTIN (GABAPENTIN) 600 MG TABLET    Take 1 tablet (600 mg total) by mouth 3 times daily    IBUPROFEN (ADVIL,MOTRIN) 20 MG/ML SUSPENSION    Take 30 mLs (600 mg total) by mouth every 6 hours as needed for Pain for up to 10 days    METHYLPHENIDATE (RITALIN) 5 MG TABLET    Take 1 tablet (5 mg total) by mouth 2 times daily (before meals)  Max daily dose: 10 mg    MUPIROCIN (BACTROBAN) 2 % OINTMENT    Apply topically  2 times daily for 14 days  to the following areas: left cheek and left nostril    OXYCODONE (ROXICODONE) 5 MG/5ML SOLUTION    Take 5 mLs (5 mg total) by mouth every 4 hours as needed for Pain  Max daily dose: 30 mg    POLYETHYLENE GLYCOL (GLYCOLAX) 17 GM/SCOOP POWDER    MIX AND THEN DRINK 17 GRAMS IN 8 OUNCES OF WATER OR JUICE ONCE DAILY    PREDNISONE (DELTASONE) 10 MG TABLET    Take 1 tablet (10 mg total) by mouth daily for 7 days   Modified Medications    No medications on file   Discontinued Medications    No medications on file          Physical Exam:  Vitals:    06/29/20 1117   BP: 130/88   Pulse: 97   Temp: 36.1 C (97 F)   Weight: 79.3 kg (174 lb 12.8 oz)   Height: 1.778 m (5\' 10" )     SpO2 Readings from Last 3 Encounters:   06/29/20 99%   06/25/20 99%   06/21/20 94%      Estimated body mass index is 25.08 kg/m as calculated from the following:    Height as of this encounter: 1.778 m (5\' 10" ).    Weight as of this encounter: 79.3 kg (174 lb 12.8 oz).  BP Readings from Last 3 Encounters:   06/29/20 130/88   06/25/20 (!) 168/108   06/21/20 130/62     Wt Readings from Last 3  Encounters:   06/29/20 79.3 kg (174 lb 12.8 oz)   06/25/20 80.1 kg (176 lb 9.6 oz)   06/21/20 81 kg (178 lb 9.2 oz)     Looks well.  Not in any acute distress  Neck-supple no adenopathy or thyromegaly  Cardiovascular-heart rate regular.  Heart sounds 1 and 2 with no added sounds no carotid bruits no edema  Respiratory-lungs are clear to palpation percussion and auscultation  Face-she has an area of erythema approximately 3 cm in diameter.  It is mildly tender.  No induration.  It does not extend to the orbit.  He has multiple small blisters around both nares    Recent Lab Results:none      Assessment and Plan:     1.  Localized area of cellulitis and probable herpes simplex infection.  Will treat with valacyclovir and Septra DS.  If no improvement after 1 week return for reevaluation.  Sooner if symptoms worsen  2.  History of pharyngitis and enlarged lymph node.  Management as per ENT  3.  History of elevated blood pressure.  Reevaluate 1 month    Orders this visit  No orders of the defined types were placed in this encounter.

## 2020-07-03 NOTE — ED Provider Notes (Signed)
[facility]                                      [place], [place]  [address]     Name:  James Oneill, James Oneill                   MR#: [id number]             Account #:    [id number]     DOB:   December 26, 1975                         Admit Status: DEP ER     Admit Date:   06/20/20  Location: [facility]     Room/Bed:            Report #: [id number]      Disch Date:   06/21/20  Family Physician: Alison Murray MD,Adrian                                                                  EMERGENCY ROOM NOTE     I tried to reach out to [facility], [facility], [facility], and [facility], all of which declined either due to lack of availability of  beds or specialty refusal.  The ER physician at [facility] did  discuss the case with Dr. Rosaria Ferries, who is the ENT doctor, and he refused; states  that he needs to be back at [facility] if all of his work was done there.  I did  reach out again to [facility], and they accepted the patient to   the  ER with the accepting physician Dr. Marylou Mccoy.   DISPOSITION:  Transfer.      DIAGNOSIS:  Retropharyngeal abscess versus local tumor recurrence.      Rulon Eisenmenger, DO     PDD/MODL  DD:  06/21/2020 09:12:14  DT:  06/21/2020 09:58:46  Job #:  [id number]                                                                               Electronically Signed   Finalized By:     Tory Emerald. Rumi Taras DO               <Signature on File>         ESigned   Date:07/03/20  Dictated by: Tory Emerald Isreal Moline DO                                Dictated   Date:06/21/20  Transcriptionist:                                                 Counsellor.   Date:06/21/20  CC:

## 2020-07-03 NOTE — ED Provider Notes (Signed)
[facility]                                      [place], [place]  [address]     Name:  James Oneill, James Oneill                   MR#: [id number]             Account #:    [id number]     DOB:   1976-03-11                         Admit Status: DEP ER     Admit Date:   06/20/20  Location: [facility]     Room/Bed:            Report #: [id number]      Disch Date:   06/21/20  Family Physician: [place] NOTE     NO DICTATION      Rulon Eisenmenger, DO     PDD/MODL  DD:  06/21/2020 08:16:54  DT:  06/21/2020 09:51:11  Job #:  [id number]                                                                                                          Electronically Signed   Finalized By:     Tory Emerald. Janett Kamath DO               <Signature on File>         ESigned   Date:07/03/20  Dictated by: Tory Emerald Toria Monte DO                                Dictated   Date:06/21/20  Transcriptionist:                                                 Counsellor.   Date:06/21/20  CC:

## 2020-07-12 ENCOUNTER — Ambulatory Visit
Admission: RE | Admit: 2020-07-12 | Discharge: 2020-07-12 | Disposition: A | Discharge status: Home / Self Care | Payer: Medicaid (Managed Care) | Source: Ambulatory Visit | Attending: Otolaryngology | Admitting: Otolaryngology

## 2020-07-12 DIAGNOSIS — Z85818 Personal history of malignant neoplasm of other sites of lip, oral cavity, and pharynx: Secondary | ICD-10-CM | POA: Insufficient documentation

## 2020-07-12 DIAGNOSIS — C109 Malignant neoplasm of oropharynx, unspecified: Secondary | ICD-10-CM

## 2020-07-12 DIAGNOSIS — Z08 Encounter for follow-up examination after completed treatment for malignant neoplasm: Secondary | ICD-10-CM

## 2020-07-12 MED ORDER — GADOTERIDOL 279.3 MG/ML (PROHANCE) IV SOLN *I*
16.0000 mL | Freq: Once | INTRAVENOUS | Status: AC
Start: 2020-07-12 — End: 2020-07-12
  Administered 2020-07-12: 16 mL via INTRAVENOUS

## 2020-07-13 ENCOUNTER — Ambulatory Visit: Payer: Medicaid (Managed Care) | Attending: Otolaryngology | Admitting: Otolaryngology

## 2020-07-13 VITALS — BP 135/81 | HR 83 | Temp 97.3°F | Resp 16 | Ht 70.0 in | Wt 178.1 lb

## 2020-07-13 DIAGNOSIS — R221 Localized swelling, mass and lump, neck: Secondary | ICD-10-CM | POA: Insufficient documentation

## 2020-07-13 DIAGNOSIS — M436 Torticollis: Secondary | ICD-10-CM | POA: Insufficient documentation

## 2020-07-13 DIAGNOSIS — C109 Malignant neoplasm of oropharynx, unspecified: Secondary | ICD-10-CM | POA: Insufficient documentation

## 2020-07-13 NOTE — Progress Notes (Signed)
Lannette Donath returns for  Followup. Mr. Gumina reports that his facial rash and sore throat have improved significantly. He did take valtrex and an antibiotic in addition to the [place]. He denies any new neck masses. He does continue to have difficulty with spasms in the neck and tongue. His weight has been stable.    A complete and comprehensive otolaryngologic examination including the ears, nose, mouth, throat, and neck was performed.   Blood pressure 135/81, pulse 83, temperature 36.3 C (97.3 F), temperature source Temporal, resp. rate 16, height 1.778 m (5\' 10" ), weight 80.8 kg (178 lb 1.6 oz), SpO2 99 %.   Examination of the neck reveals postoperative and radiation changes. The incision is well-healed. There are no residual skin lesions on the face. There is no adenopathy. Normal tracheal and laryngeal crepitus is intact. The ears are clear. The oral cavity, oropharynx, nasal cavity and larynx are unremarkable. Nasal endoscopy demonstrates resolution of the nasopharyngeal fullness seen on prior exam. There is no sign of recurrent disease. The remainder of my examination revealed no significant abnormalities.      I did have the opportunity to review the images from his most recent MRI. These demonstrate no evidence of recurrent disease or other abnormalities.       Mr. Dunlow is doing well. He was reassured by the MRI and exam findings today. I suspect that the retropharyngeal node was reactive but it has now resolved.     I have referred him to PT/OT for evaluation of the neck stiffness/spasms. We discussed possible PM&R consult but he would like to start with conservative measures for now.     WE will see him back in six months or sooner as needed. Obviously I would be more than happy to see him in the interim should he develop any new, progressive, or recurrent symptoms that require my attention.  Mr. Hessling was reminded of the ongoing risk of recurrent disease, the need for continued surveillance  as outlined in the current NCCN guidelines, and the signs/symptoms that should prompt him to contact me in advance of his next scheduled visit.

## 2020-07-13 NOTE — Procedures (Signed)
The nasal cavity was anesthetized with half percent tetracaine and epinephrine. The flexible laryngoscope was inserted through the nasal cavity and passed atraumatically into the pharynx. The pharynx and larynx were visualized. THere is no sign of residual fullness in the nasopharynx.     The vocal folds were mobile. The airway is widely patent. There are mild to moderate post-radiation changes but no sign of recurrent disease or other abnormalities. There were no masses or lesions. The scope was withdrawn. He tolerated the procedure well.

## 2020-07-21 ENCOUNTER — Other Ambulatory Visit: Payer: Self-pay | Admitting: Primary Care

## 2020-07-21 MED ORDER — FLUOXETINE HCL 20 MG PO CAPS *I*
20.0000 mg | ORAL_CAPSULE | Freq: Every day | ORAL | 1 refills | Status: DC
Start: 2020-07-21 — End: 2020-10-11

## 2020-07-21 MED ORDER — POLYETHYLENE GLYCOL 3350 PO POWD *I*
17.0000 g | Freq: Every day | ORAL | 1 refills | Status: DC
Start: 2020-07-21 — End: 2020-10-11

## 2020-07-21 MED ORDER — ESOMEPRAZOLE MAGNESIUM 40 MG PO CPDR *I*
40.0000 mg | DELAYED_RELEASE_CAPSULE | Freq: Every day | ORAL | 0 refills | Status: DC
Start: 2020-07-21 — End: 2020-08-25

## 2020-07-21 MED ORDER — FENTANYL 25 MCG/HR TD PT72 *I*
1.0000 | MEDICATED_PATCH | TRANSDERMAL | 0 refills | Status: DC
Start: 2020-07-21 — End: 2020-08-25

## 2020-07-21 MED ORDER — CYCLOBENZAPRINE HCL 5 MG PO TABS *I*
5.0000 mg | ORAL_TABLET | Freq: Three times a day (TID) | ORAL | 2 refills | Status: DC | PRN
Start: 2020-07-21 — End: 2020-08-25

## 2020-07-21 MED ORDER — METHYLPHENIDATE HCL 5 MG PO TABS *I*
5.0000 mg | ORAL_TABLET | Freq: Two times a day (BID) | ORAL | 0 refills | Status: DC
Start: 2020-07-21 — End: 2020-08-25

## 2020-07-30 ENCOUNTER — Ambulatory Visit: Payer: Medicaid (Managed Care) | Admitting: Primary Care

## 2020-08-20 ENCOUNTER — Encounter: Payer: Self-pay | Admitting: Primary Care

## 2020-08-20 ENCOUNTER — Telehealth: Payer: Self-pay | Admitting: Primary Care

## 2020-08-20 ENCOUNTER — Ambulatory Visit: Payer: Medicaid (Managed Care) | Admitting: Primary Care

## 2020-08-20 VITALS — BP 130/90 | HR 81 | Temp 97.6°F | Wt 182.0 lb

## 2020-08-20 DIAGNOSIS — G540 Brachial plexus disorders: Secondary | ICD-10-CM

## 2020-08-20 DIAGNOSIS — R030 Elevated blood-pressure reading, without diagnosis of hypertension: Secondary | ICD-10-CM

## 2020-08-20 DIAGNOSIS — Z23 Encounter for immunization: Secondary | ICD-10-CM

## 2020-08-20 DIAGNOSIS — K589 Irritable bowel syndrome without diarrhea: Secondary | ICD-10-CM

## 2020-08-20 DIAGNOSIS — J390 Retropharyngeal and parapharyngeal abscess: Secondary | ICD-10-CM

## 2020-08-20 NOTE — Telephone Encounter (Signed)
Pt aware.

## 2020-08-20 NOTE — Progress Notes (Signed)
Patient ID: James Oneill is a 44 y.o. year old male.    Chief Complaint   Patient presents with    Other     Routine follow up     HPI: James Oneill is here today for reevaluation of his borderline hypertension and chronic neck and shoulder girdle pain.  No reports of chest pain palpitations dyspnea orthopnea PND  He does complain over the last few months that he has experienced a complete change in his bowel habit.  The first stool of the day is ribbonlike.  Later in the day he may have diarrhea with severe urgency.  Is gone to the point where he has bought a camping toilet for his SUV.  He had a colonoscopy back in June which was normal apart from a tubular adenoma which was removed.  He continues to take polyethylene glycol on a daily basis        The medication and allergy list was reviewed and is accurate  Allergy / Social History / Medications:  No Known Allergies (drug, envir, food or latex)  Social History     Tobacco Use    Smoking status: Never Smoker    Smokeless tobacco: Never Used   Substance Use Topics    Alcohol use: Yes     Alcohol/week: 1.0 standard drink     Types: 1 Glasses of wine per week     Patient's Medications   New Prescriptions    No medications on file   Previous Medications    BISACODYL (BISACODYL) 5 MG EC TABLET    Take 5 mg by mouth daily as needed for Constipation    CYCLOBENZAPRINE (FLEXERIL) 5 MG TABLET    Take 1 tablet (5 mg total) by mouth 3 times daily as needed for Muscle spasms    ESOMEPRAZOLE (NEXIUM) 40 MG CAPSULE    Take 1 capsule (40 mg total) by mouth daily (before breakfast)  for Gastroesophageal Reflux Disease    FENTANYL (DURAGESIC) 25 MCG/HR PATCH    Apply 1 patch onto the skin every 48 hours  Max daily dose: 1 patch Remove & discard patch after 48 hours.    FLUOXETINE (PROZAC) 20 MG CAPSULE    Take 1 capsule (20 mg total) by mouth daily    GABAPENTIN (GABAPENTIN) 600 MG TABLET    Take 1 tablet (600 mg total) by mouth 3 times daily    METHYLPHENIDATE (RITALIN) 5 MG  TABLET    Take 1 tablet (5 mg total) by mouth 2 times daily (before meals)  Max daily dose: 10 mg    POLYETHYLENE GLYCOL (GLYCOLAX) 17 GM/SCOOP POWDER    Take 17 g by mouth daily  Mix in 8 oz water or juice and drink.    VALACYCLOVIR (VALTREX) 1 GM TABLET    Take 1 tablet (1,000 mg total) by mouth 2 times daily   Modified Medications    No medications on file   Discontinued Medications    OXYCODONE (ROXICODONE) 5 MG/5ML SOLUTION    Take 5 mLs (5 mg total) by mouth every 4 hours as needed for Pain  Max daily dose: 30 mg    SULFAMETHOXAZOLE-TRIMETHOPRIM (BACTRIM DS,SEPTRA DS) 800-160 MG PER TABLET    Take 1 tablet by mouth 2 times daily          Physical Exam:  Vitals:    08/20/20 1055   BP: 130/90   Pulse: 81   Temp: 36.4 C (97.6 F)   Weight: 82.6 kg (182 lb)  SpO2 Readings from Last 3 Encounters:   08/20/20 96%   07/13/20 99%   06/29/20 99%      Estimated body mass index is 26.11 kg/m as calculated from the following:    Height as of 07/13/20: 1.778 m (5\' 10" ).    Weight as of this encounter: 82.6 kg (182 lb).  BP Readings from Last 3 Encounters:   08/20/20 130/90   07/13/20 135/81   06/29/20 130/88     Wt Readings from Last 3 Encounters:   08/20/20 82.6 kg (182 lb)   07/13/20 80.8 kg (178 lb 1.6 oz)   06/29/20 79.3 kg (174 lb 12.8 oz)     Looks well.  Not in any acute distress  Neck-supple no adenopathy or thyromegaly  Cardiovascular-heart rate regular.  Heart sounds 1 and 2 with no added sounds no carotid bruits no edema  Respiratory-lungs are clear to palpation percussion and auscultation.  Abdomen is soft without masses or tenderness.  Bowel sounds are normal    Recent Lab Results:none      Assessment and Plan:     1.  Borderline hypertension.  Continue to monitor  2.  Change in bowel habit very suggestive of irritable bowel syndrome.  Continue with high-fiber diet.  I would recommend that he only takes the polyethylene glycol if he misses a day with bowel movements  3.  Chronic neck and shoulder pain.   Continue with fentanyl  4.  High-dose flu vaccine today.  Recommend COVID-19 booster    Orders this visit  No orders of the defined types were placed in this encounter.

## 2020-08-20 NOTE — Telephone Encounter (Signed)
At check out, patient stated the ER told him he needed to repeat the labs that were done at the ER after he finished the course of medication    There were no labs ordered at check out today.      Please advise  Patient would like a call if he is to get the labs done    Contact: [phone]

## 2020-08-20 NOTE — Telephone Encounter (Signed)
I reviewed his lab work I did not see anything majorly out of line.  However we will recheck a CBC and CMP.  He can get this drawn at his convenience

## 2020-08-23 ENCOUNTER — Other Ambulatory Visit: Payer: Medicaid (Managed Care)

## 2020-08-23 ENCOUNTER — Other Ambulatory Visit: Payer: Self-pay | Admitting: Primary Care

## 2020-08-23 DIAGNOSIS — J390 Retropharyngeal and parapharyngeal abscess: Secondary | ICD-10-CM

## 2020-08-23 DIAGNOSIS — R5383 Other fatigue: Secondary | ICD-10-CM

## 2020-08-23 LAB — COMPREHENSIVE METABOLIC PANEL
ALT: 34 U/L (ref 0–50)
AST: 37 U/L (ref 0–50)
Albumin: 5 g/dL (ref 3.5–5.2)
Alk Phos: 66 U/L (ref 40–130)
Anion Gap: 14 mmol/L (ref 6–15)
Bilirubin,Total: 1.5 mg/dL — ABNORMAL HIGH (ref 0.0–1.2)
CO2: 26 mmol/L (ref 20–28)
Calcium: 10.1 mg/dL (ref 8.6–10.2)
Chloride: 99 mmol/L (ref 96–108)
Creatinine: 1.1 mg/dL (ref 0.67–1.17)
GFR,Black: 87.2 (ref >60)
GFR,Caucasian: 72.06 (ref >60)
Glucose: 95 mg/dL (ref 60–99)
Lab: 13.7 mg/dL (ref 6.0–20.0)
Potassium: 4 mmol/L (ref 3.3–5.1)
Sodium: 139 mmol/L (ref 133–145)
Total Protein: 7.6 g/dL (ref 6.3–7.7)

## 2020-08-23 LAB — CBC
Hematocrit: 43.6 % (ref 40.1–51.0)
Hemoglobin: 14.8 g/dL (ref 13.7–17.5)
MCH: 30.4 pg (ref 25.7–32.2)
MCHC: 33.9 g/dl (ref 32.3–36.5)
MCV: 89.5 fl (ref 79.0–92.2)
Mean Platelet Volume: 10.1 fl (ref 9.4–12.4)
Platelets: 288 10*3/uL (ref 150–330)
RBC Distribution Width-SD: 41 fl (ref 35.1–43.9)
RBC: 4.87 10*6/uL (ref 4.63–6.08)
RDW: 12.4 % (ref 11.6–14.4)
WBC: 5.93 10*3/uL (ref 4.23–9.07)

## 2020-08-25 ENCOUNTER — Other Ambulatory Visit: Payer: Self-pay | Admitting: Primary Care

## 2020-08-25 MED ORDER — FENTANYL 25 MCG/HR TD PT72 *I*
1.0000 | MEDICATED_PATCH | TRANSDERMAL | 0 refills | Status: DC
Start: 2020-08-25 — End: 2020-10-11

## 2020-08-25 MED ORDER — ESOMEPRAZOLE MAGNESIUM 40 MG PO CPDR *I*
40.0000 mg | DELAYED_RELEASE_CAPSULE | Freq: Every day | ORAL | 0 refills | Status: DC
Start: 2020-08-25 — End: 2020-10-11

## 2020-08-25 MED ORDER — GABAPENTIN 600 MG PO TABLET *I*
600.0000 mg | ORAL_TABLET | Freq: Three times a day (TID) | ORAL | 2 refills | Status: DC
Start: 2020-08-25 — End: 2021-01-06

## 2020-08-25 MED ORDER — METHYLPHENIDATE HCL 5 MG PO TABS *I*
5.0000 mg | ORAL_TABLET | Freq: Two times a day (BID) | ORAL | 0 refills | Status: DC
Start: 2020-08-25 — End: 2020-10-11

## 2020-08-25 MED ORDER — CYCLOBENZAPRINE HCL 5 MG PO TABS *I*
5.0000 mg | ORAL_TABLET | Freq: Three times a day (TID) | ORAL | 2 refills | Status: DC | PRN
Start: 2020-08-25 — End: 2020-10-11

## 2020-09-20 ENCOUNTER — Ambulatory Visit: Payer: Medicaid (Managed Care) | Admitting: Radiation Oncology

## 2020-09-27 ENCOUNTER — Ambulatory Visit: Payer: Medicaid (Managed Care) | Attending: Radiation Oncology | Admitting: Radiation Oncology

## 2020-09-27 ENCOUNTER — Encounter: Payer: Self-pay | Admitting: Radiation Oncology

## 2020-09-27 VITALS — BP 141/91 | HR 88 | Wt 186.6 lb

## 2020-09-27 DIAGNOSIS — C099 Malignant neoplasm of tonsil, unspecified: Secondary | ICD-10-CM

## 2020-09-27 DIAGNOSIS — Z08 Encounter for follow-up examination after completed treatment for malignant neoplasm: Secondary | ICD-10-CM

## 2020-09-27 NOTE — Progress Notes (Signed)
Pt is 4 years S/P RT to head and neck.  BP (!) 141/91    Pulse 88    Wt 84.6 kg (186 lb 9.6 oz)    BMI 26.77 kg/m     Pain    09/27/20 1407   PainSc:   3   PainLoc: Neck     Karnofsky Performance Status: 80% - Normal activity with effort, some signs or symptoms of disease    Fatigue: 5 - Moderate fatigue    Safety/Protective Mechanisms  Two Patient Identifiers Confirmed?: Yes    Fall Risk  RN assessed patient for risk of falls: Yes  Criteria for Risk of Falls: None     SMOKING:   no    OTALGIA:  no    DYSPHAGIA:  yes - able to eat and drink but food gets stuck    LARYNGITIS  no    TASTE CHANGES  no    XEROSTOMIA  yes - not using anything, drinks water    FLUORIDE  no    ANOREXIA  no    TUBE FEEDINGS:   no      NAUSEA  no    VOMITING  no    HEARING CHANGE no      Comment:  Food gets stuck, especially bread. Needs to eat more food to help it move down. Experiencing muscle cramps in left jaw; left neck pain when looking down. Some time has appearance of left lip/mouth being pulled to the site with feeling of tightness. ROM issues with left side.    Patient complied with masking policy throughout the duration of the appointment.   Writer maintained use of mask and faceshield/eyewear throughout  the duration of the appointment.   Writer was within 6 feet of patient for < 5 minutes due to the nature of the appointment.

## 2020-09-27 NOTE — Progress Notes (Signed)
Radiation oncology follow-up clinic note 10/15/20      Patient Identifier:  James Oneill is a 44 y.o. male with cT1N1 left tonsillar cancer. S/p definitve RT in 2017, recurrence in 2018, underwent neck dissection. He is seen in follow up. His treatments were all completed in [place] and is 3 years post recurrence    Oncologic History:   Mar 03, 2016: CT scan showed left jugulodigastric lymphadenopathy.    02/25/2018: CT neck showed no evidence of recurrence.  (And sternocleidomastoid atrophy after interval left neck dissection.    04/07/2018: PET-CT showed Solitary left level II lymph node is enlarged and hypermetabolic.  No primary or distant metastasis was identified.    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     04/2016: definitve RT alone to left tonsil and bilateral neck 70 Gy with Dr. Isidore Moos at [facility].     07/03/2017: neck recurrence, had left modified radical neck dissection. Path reported level IIA 1/19 LN positive with ECE, level 2B 0/17 LN positive. He did not receive any cancer therapy after surgery.    08/23/2018: PET-CT is negative for tumor recurrence.    The patient reports pain starting 3 months after radiation therapy. He was seen at [facility] previously for possible neuromuscular disease. Had calf biopsy which was normal. But EMG showed abnormality.      Previous Oncologic History:  Cancer Staging  Tonsillar cancer  Staging form: Pharynx - HPV-Mediated Oropharynx, AJCC 8th Edition  - Clinical stage from 08/16/2018: Stage I (cT1, cN1, cM0, p16+) - Signed by Bruce Donath, MBBS on 08/16/2018        Current therapy: surveillance    Interval History:    Stable neurologic symptoms with cervical flexion causing discomfort, some torticollis. No progressive neuropathic pain.     Taste mostly returned, stable and mild xerostomia. No new pain. No new neck masses. Chronic discomfort in the neck unchanged    Symptoms around infection in Sept all resolved. No tobacco use.   Bowels variable from constipation to very frequent.     Past Medical History:   Diagnosis Date    Cancer     Depression     MVP (mitral valve prolapse)    ,   Past Surgical History:   Procedure Laterality Date    ELBOW FRACTURE SURGERY      GALLBLADDER SURGERY  2019    HERNIA REPAIR  4696    umbilical    NECK SURGERY Left 2018    radical dissection    TONSILLECTOMY  2017    SCC Left tonsil   ,   Social History     Socioeconomic History    Marital status: Divorced     Spouse name: Not on file    Number of children: Not on file    Years of education: Not on file    Highest education level: Not on file   Tobacco Use    Smoking status: Never Smoker    Smokeless tobacco: Never Used   Substance and Sexual Activity    Alcohol use: Yes     Alcohol/week: 1.0 standard drink     Types: 1 Glasses of wine per week    Drug use: Not Currently    Sexual activity: Not Currently   Other Topics Concern    Not on file   Social History Narrative    Not on file    and No Known Allergies (drug, envir, food or latex)  ROS  8 pt ROS obtained and negative    Vitals:    09/27/20 1407   BP: (!) 141/91   Pulse: 88   Weight: 84.6 kg (186 lb 9.6 oz)     Pain    09/27/20 1407   PainSc:   3   PainLoc: Neck     Wt Readings from Last 20 Encounters:   09/27/20 84.6 kg (186 lb 9.6 oz)   08/20/20 82.6 kg (182 lb)   07/13/20 80.8 kg (178 lb 1.6 oz)   06/29/20 79.3 kg (174 lb 12.8 oz)   07/09/20 77.1 kg (170 lb)   06/25/20 80.1 kg (176 lb 9.6 oz)   06/21/20 81 kg (178 lb 9.2 oz)   05/21/20 80.8 kg (178 lb 3.2 oz)   05/20/20 81 kg (178 lb 8 oz)   02/24/20 82.1 kg (181 lb)   02/17/20 82.1 kg (181 lb)   02/16/20 79.4 kg (175 lb)   02/09/20 82.6 kg (182 lb)   11/07/19 81.1 kg (178 lb 14.4 oz)   11/05/19 80.3 kg (177 lb)   09/19/19 78.1 kg (172 lb 4 oz)   08/13/19 73.2 kg (161 lb 6.4 oz)   08/07/19 72.1 kg (159 lb)   06/17/19 70.5 kg (155 lb 8 oz)   06/16/19 70.8 kg (156 lb)       Performance Status: Score-70-Cares for self, unable to do  activities or active work.    Physical Exam  Sitting in chair, comfortable  Neck with left sided induration, no edema, no adenopathy  No trismus, oral mucosa somewhat dry, no obviously crack teeth, no mucosal lesions over visualized oral cavity, tongue mobility preserved    Without topical anesthetic into the nares, the flexible laryngoscope was atraumatically inserted into the LEFT nare. The nasal mucosa was adequately hydrated and had some moderate clear discharge with edema. The nasopharynx was clear of any lesions. The visualized pharyngeal wall was clear of any lesions. Base of tongue, vallecula clear but again with thicker, adherent white secretions, epiglottis crisp, no laryngeal or supraglottic lesions, pyriform sinus clear. The scope was then withdrawn      Imaging review:  Reviewed CT neck and MRI from September    MRI specifically shows no areas of concern to me or on official read.     Assessment:  No evidence of recurrence. MRI also very reassuring.     Issue on left facial skin may be ?infection at vestibule with reactive facial nodes. He will try alternating nasal saline and flonase.    GERD - prilosec 40mg   Dental - he is going to reconnect to [facility]  Dysphagia - stable   Left sided pain - stable and likely post tx in nature from nerve compression  Fatigue / bowels  - repeat TSH/T4 due in April, last normal    Plan:   Continue surveillance monitoring. The patient understands the importance of follow up  He is now 3 years out from his successfully salvage recurrence with recent imaging  Will see him 6 months after Dr. Sabra Heck who is seeing him late March / Early April

## 2020-10-11 ENCOUNTER — Other Ambulatory Visit: Payer: Self-pay | Admitting: Primary Care

## 2020-10-11 MED ORDER — POLYETHYLENE GLYCOL 3350 PO POWD *I*
17.0000 g | Freq: Every day | ORAL | 1 refills | Status: DC
Start: 2020-10-11 — End: 2023-08-20

## 2020-10-11 MED ORDER — ESOMEPRAZOLE MAGNESIUM 40 MG PO CPDR *I*
40.0000 mg | DELAYED_RELEASE_CAPSULE | Freq: Every day | ORAL | 0 refills | Status: DC
Start: 2020-10-11 — End: 2020-12-08

## 2020-10-11 MED ORDER — CYCLOBENZAPRINE HCL 5 MG PO TABS *I*
5.0000 mg | ORAL_TABLET | Freq: Three times a day (TID) | ORAL | 2 refills | Status: DC | PRN
Start: 2020-10-11 — End: 2020-11-30

## 2020-10-11 MED ORDER — FENTANYL 25 MCG/HR TD PT72 *I*
1.0000 | MEDICATED_PATCH | TRANSDERMAL | 0 refills | Status: DC
Start: 2020-10-11 — End: 2020-11-30

## 2020-10-11 MED ORDER — FLUOXETINE HCL 20 MG PO CAPS *I*
20.0000 mg | ORAL_CAPSULE | Freq: Every day | ORAL | 1 refills | Status: DC
Start: 2020-10-11 — End: 2021-02-28

## 2020-10-11 MED ORDER — METHYLPHENIDATE HCL 5 MG PO TABS *I*
5.0000 mg | ORAL_TABLET | Freq: Two times a day (BID) | ORAL | 0 refills | Status: DC
Start: 2020-10-11 — End: 2020-11-30

## 2020-10-18 ENCOUNTER — Telehealth: Payer: Self-pay | Admitting: Primary Care

## 2020-10-18 ENCOUNTER — Ambulatory Visit: Payer: Medicaid (Managed Care) | Admitting: Primary Care

## 2020-10-18 ENCOUNTER — Encounter: Payer: Self-pay | Admitting: Primary Care

## 2020-10-18 ENCOUNTER — Other Ambulatory Visit: Payer: Self-pay | Admitting: Primary Care

## 2020-10-18 VITALS — BP 130/84 | HR 112 | Temp 96.9°F | Wt 188.0 lb

## 2020-10-18 DIAGNOSIS — J069 Acute upper respiratory infection, unspecified: Secondary | ICD-10-CM

## 2020-10-18 NOTE — Progress Notes (Signed)
Patient ID: James Oneill is a 45 y.o. year old male.    Chief Complaint   Patient presents with    Cough     HPI:  James Oneill has been unwell for a couple of days with malaise burning chest and cough.  Cough is productive of a small amount of gray-colored sputum.  No fever no chills.  No sore throat  No diarrhea or vomiting  He has had 2 Covid vaccinations.  Booster was actually scheduled for today.  Not been in contact with anybody with Covid      The medication and allergy list was reviewed and is accurate  Allergy / Social History / Medications:  No Known Allergies (drug, envir, food or latex)  Social History     Tobacco Use    Smoking status: Never Smoker    Smokeless tobacco: Never Used   Substance Use Topics    Alcohol use: Yes     Alcohol/week: 1.0 standard drink     Types: 1 Glasses of wine per week     Patient's Medications   New Prescriptions    No medications on file   Previous Medications    BISACODYL (BISACODYL) 5 MG EC TABLET    Take 5 mg by mouth daily as needed for Constipation    CYCLOBENZAPRINE (FLEXERIL) 5 MG TABLET    Take 1 tablet (5 mg total) by mouth 3 times daily as needed for Muscle spasms    ESOMEPRAZOLE (NEXIUM) 40 MG CAPSULE    Take 1 capsule (40 mg total) by mouth daily (before breakfast)  for Gastroesophageal Reflux Disease    FENTANYL (DURAGESIC) 25 MCG/HR PATCH    Apply 1 patch onto the skin every 48 hours  Max daily dose: 1 patch Remove & discard patch after 48 hours.    FLUOXETINE (PROZAC) 20 MG CAPSULE    Take 1 capsule (20 mg total) by mouth daily    GABAPENTIN (GABAPENTIN) 600 MG TABLET    Take 1 tablet (600 mg total) by mouth 3 times daily    METHYLPHENIDATE (RITALIN) 5 MG TABLET    Take 1 tablet (5 mg total) by mouth 2 times daily (before meals)  Max daily dose: 10 mg    POLYETHYLENE GLYCOL (GLYCOLAX) 17 GM/SCOOP POWDER    Take 17 g by mouth daily  Mix in 8 oz water or juice and drink.    VALACYCLOVIR (VALTREX) 1 GM TABLET    Take 1 tablet (1,000 mg total) by mouth 2 times  daily   Modified Medications    No medications on file   Discontinued Medications    No medications on file          Physical Exam:  Vitals:    10/18/20 1019   BP: 130/84   Pulse: (!) 112   Temp: 36.1 C (96.9 F)   Weight: 85.3 kg (188 lb)     SpO2 Readings from Last 3 Encounters:   10/18/20 94%   08/20/20 96%   07/13/20 99%      Estimated body mass index is 26.98 kg/m as calculated from the following:    Height as of 07/13/20: 1.778 m (5\' 10" ).    Weight as of this encounter: 85.3 kg (188 lb).  BP Readings from Last 3 Encounters:   10/18/20 130/84   09/27/20 (!) 141/91   08/20/20 130/90     Wt Readings from Last 3 Encounters:   10/18/20 85.3 kg (188 lb)   09/27/20 84.6 kg (186 lb  9.6 oz)   08/20/20 82.6 kg (182 lb)     Looks well.  Not in any acute distress  ENT-normal  Neck-supple no adenopathy or thyromegaly  Cardiovascular-heart rate regular.  Heart sounds 1 and 2 with no added sounds no carotid bruits no edema  Respiratory-lungs are clear to palpation percussion and auscultation    Recent Lab Results:none      Assessment and Plan:     A prescription infection.  Need to evaluate for Covid.  COVID-19 swab taken.  Needs to isolate to the results known to be negative.  Recommend symptomatic treatment orders this visit  No orders of the defined types were placed in this encounter.

## 2020-10-18 NOTE — Telephone Encounter (Signed)
Spoke with Jenny Reichmann, he requested an appt today with Dr.Ashdown for chest congestion and cough, pt given [facility] appt

## 2020-10-18 NOTE — Telephone Encounter (Signed)
Heavy chest congestion, non-productive cough - wants to be seen today - please call to triage

## 2020-10-19 ENCOUNTER — Telehealth: Payer: Self-pay | Admitting: Primary Care

## 2020-10-19 LAB — COVID-19 PCR

## 2020-10-19 LAB — COVID-19 NAAT (PCR): COVID-19 NAAT (PCR): NEGATIVE

## 2020-10-19 MED ORDER — BENZONATATE 200 MG PO CAPS *I*
200.0000 mg | ORAL_CAPSULE | Freq: Three times a day (TID) | ORAL | 0 refills | Status: DC | PRN
Start: 2020-10-19 — End: 2021-12-05

## 2020-10-19 NOTE — Telephone Encounter (Signed)
Patient is calling stating his Covid symptoms have gotten much worse through the night  His ears are painful (full feeling) his fever is very high  And very short of breath.      He is wondering if there is any suggested over the counter treatment he can take or he can have a script sent in  While he is waiting for results.  She as is very uncomfortable.     Please review and triage further.

## 2020-10-19 NOTE — Telephone Encounter (Signed)
Patient was in yesterday and saw Dr. And had a Covid swab.  Today he has lost his taste and smell and he feels like his cough is worse.  He was easily able to finish his sentence on the phone.  He has a low-grade fever of 100.  He is urinating and drinking fluids well.  He wants some symptom treatment.  I gave him Tessalon Perles 3 times daily as needed for the cough.  Instructed that if he gets acute dyspnea, chest pain or dehydration go to the emergency room.  And to continue to quarantine until results of Covid swab come back

## 2020-11-30 ENCOUNTER — Other Ambulatory Visit: Payer: Self-pay | Admitting: Primary Care

## 2020-11-30 MED ORDER — CYCLOBENZAPRINE HCL 5 MG PO TABS *I*
5.0000 mg | ORAL_TABLET | Freq: Three times a day (TID) | ORAL | 2 refills | Status: DC | PRN
Start: 2020-11-30 — End: 2021-01-06

## 2020-11-30 MED ORDER — FENTANYL 25 MCG/HR TD PT72 *I*
1.0000 | MEDICATED_PATCH | TRANSDERMAL | 0 refills | Status: DC
Start: 2020-11-30 — End: 2021-01-06

## 2020-11-30 MED ORDER — METHYLPHENIDATE HCL 5 MG PO TABS *I*
5.0000 mg | ORAL_TABLET | Freq: Two times a day (BID) | ORAL | 0 refills | Status: DC
Start: 2020-11-30 — End: 2021-01-06

## 2020-11-30 NOTE — Telephone Encounter (Addendum)
Pt has not filled Fentanyl since December per I-STOP (filled last 12/31); same as methylphenidate.

## 2020-12-08 ENCOUNTER — Other Ambulatory Visit: Payer: Self-pay | Admitting: Primary Care

## 2021-01-06 ENCOUNTER — Other Ambulatory Visit: Payer: Self-pay | Admitting: Primary Care

## 2021-01-06 MED ORDER — FENTANYL 25 MCG/HR TD PT72 *I*
1.0000 | MEDICATED_PATCH | TRANSDERMAL | 0 refills | Status: DC
Start: 2021-01-06 — End: 2021-02-28

## 2021-01-06 MED ORDER — CYCLOBENZAPRINE HCL 5 MG PO TABS *I*
5.0000 mg | ORAL_TABLET | Freq: Three times a day (TID) | ORAL | 2 refills | Status: DC | PRN
Start: 2021-01-06 — End: 2021-02-28

## 2021-01-06 MED ORDER — METHYLPHENIDATE HCL 5 MG PO TABS *I*
5.0000 mg | ORAL_TABLET | Freq: Two times a day (BID) | ORAL | 0 refills | Status: DC
Start: 2021-01-06 — End: 2021-02-28

## 2021-01-06 MED ORDER — GABAPENTIN 600 MG PO TABLET *I*
600.0000 mg | ORAL_TABLET | Freq: Three times a day (TID) | ORAL | 2 refills | Status: DC
Start: 2021-01-06 — End: 2021-05-02

## 2021-01-11 ENCOUNTER — Encounter: Payer: Self-pay | Admitting: Otolaryngology

## 2021-01-11 ENCOUNTER — Ambulatory Visit: Payer: Medicaid (Managed Care) | Attending: Otolaryngology | Admitting: Otolaryngology

## 2021-01-11 VITALS — BP 164/100 | HR 90 | Temp 96.6°F | Resp 16 | Ht 70.0 in | Wt 180.6 lb

## 2021-01-11 DIAGNOSIS — C109 Malignant neoplasm of oropharynx, unspecified: Secondary | ICD-10-CM | POA: Insufficient documentation

## 2021-01-11 DIAGNOSIS — R1312 Dysphagia, oropharyngeal phase: Secondary | ICD-10-CM

## 2021-01-11 DIAGNOSIS — J3489 Other specified disorders of nose and nasal sinuses: Secondary | ICD-10-CM

## 2021-01-11 MED ORDER — MUPIROCIN 2 % EX OINT *I*
TOPICAL_OINTMENT | Freq: Three times a day (TID) | CUTANEOUS | 3 refills | Status: DC
Start: 2021-01-11 — End: 2022-12-17

## 2021-01-11 MED ORDER — FLUTICASONE PROPIONATE 50 MCG/ACT NA SUSP *I*
1.0000 | Freq: Every day | NASAL | 5 refills | Status: DC
Start: 2021-01-11 — End: 2021-02-28

## 2021-01-11 NOTE — Procedures (Signed)
The flexible laryngoscope was passed atraumatically into the pharynx. The pharynx and larynx were visualized. The vocal folds were mobile. The airway is widely patent. There are mild to moderate post-radiation changes but no sign of recurrent disease or other abnormalities. There were no masses or lesions. The scope was withdrawn. He tolerated the procedure well.

## 2021-01-11 NOTE — Progress Notes (Signed)
James Centorereturns for followup.JamesCentoreis avery pleasant43 y.o.gentlemanwho was treated with definitive radiation therapy in 2017. He recurred in the neck in 2018 and at that time underwent salvage left neck dissection in [place]. He has been disease free since then and transferred his care to [facility] in the fall of 2019. Since his last visit with me he has been doing relatively well. He has not noticed any new masses or swelling in the head and neck. There has been no odynophagia, otalgia, or hemoptysis. There have been no significant voice changes. He has been having mild dysphagia to dry foods such as bread. He denies any coughing or choking with swallowing. His weight has been stable. James Oneill has had no fevers, chills, or sweats.    He does continue to intermittently develop perinasal rash with pustules. He is using saline spray without significnat releif.    A complete and comprehensive otolaryngologic examination including the ears, nose, mouth, throat, and neck was performed.   Blood pressure (!) 164/100, pulse 90, temperature 35.9 C (96.6 F), temperature source Temporal, resp. rate 16, height 1.778 m (5\' 10" ), weight 81.9 kg (180 lb 9.6 oz), SpO2 100 %.   Examination of the neck reveals postoperative and radiation changes. There is no adenopathy. Normal tracheal and laryngeal crepitus is intact. The ears are clear.   The oral cavity, oropharynx, and nasal cavity are unremarkable wit hthe exception of a few vesicular eruptions in the right nasal vestibule/philtrum region. Due to his active gag reflex, mirror examination was inadequate. Flexible laryngoscopy revealed no evidence of recurrent disease or other abnormalities.      The remainder of my examination revealed no significant abnormalities     James Oneill is doing well from a cancer standpoint. We will continue to see him at six month intervals, alternating with Dr. Maisie Fus. 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 James Oneill was reminded of the ongoing risk of recurrent disease, the need for continued surveillance as outlined in the current NCCN guidelines, and the signs/symptoms that should prompt him to contact me in advance of his next scheduled visit.  Obviously I would be more than happy to see him in the interim should he develop any new, progressive, or recurrent symptoms that require my attention.

## 2021-01-18 ENCOUNTER — Encounter: Payer: Self-pay | Admitting: Primary Care

## 2021-01-18 ENCOUNTER — Telehealth: Payer: Self-pay | Admitting: Primary Care

## 2021-01-18 NOTE — Telephone Encounter (Signed)
Fentanyl 57mcg/hr 72hr patches require PA please

## 2021-01-18 NOTE — Telephone Encounter (Signed)
Error

## 2021-01-18 NOTE — Telephone Encounter (Signed)
Methylphenidate 5mg  tabs require PA please

## 2021-01-19 NOTE — Telephone Encounter (Signed)
Patient upset, states that [facility] told him we are not responding to their claims of getting this medication filled. I did let him know that we have sent the prior auth in for this medication. He would like a call when this has been approved. Thank you

## 2021-01-20 ENCOUNTER — Other Ambulatory Visit
Admission: RE | Admit: 2021-01-20 | Discharge: 2021-01-20 | Disposition: A | Discharge status: Home / Self Care | Payer: Medicaid (Managed Care) | Source: Ambulatory Visit | Attending: Radiation Oncology | Admitting: Radiation Oncology

## 2021-01-20 DIAGNOSIS — C099 Malignant neoplasm of tonsil, unspecified: Secondary | ICD-10-CM | POA: Insufficient documentation

## 2021-01-20 LAB — T4, FREE: Free T4: 1.1 ng/dL (ref 0.9–1.7)

## 2021-01-20 LAB — TSH: TSH: 4.11 u[IU]/mL (ref 0.27–4.20)

## 2021-01-20 NOTE — Telephone Encounter (Signed)
Approved for the fentanyl until 01/20/22

## 2021-01-20 NOTE — Telephone Encounter (Signed)
Called pt and made him aware that prior auth was approved called pharmacy and they confirmed they received approval.

## 2021-01-21 NOTE — Telephone Encounter (Signed)
Called and let pt know that prior auth was approved and did pharmacy received it.

## 2021-01-25 ENCOUNTER — Ambulatory Visit: Payer: Medicaid (Managed Care) | Admitting: Otolaryngology

## 2021-02-25 ENCOUNTER — Other Ambulatory Visit: Payer: Self-pay | Admitting: Primary Care

## 2021-02-28 ENCOUNTER — Other Ambulatory Visit: Payer: Self-pay | Admitting: Otolaryngology

## 2021-02-28 ENCOUNTER — Other Ambulatory Visit: Payer: Self-pay | Admitting: Primary Care

## 2021-02-28 DIAGNOSIS — J329 Chronic sinusitis, unspecified: Secondary | ICD-10-CM

## 2021-02-28 MED ORDER — METHYLPHENIDATE HCL 5 MG PO TABS *I*
5.0000 mg | ORAL_TABLET | Freq: Two times a day (BID) | ORAL | 0 refills | Status: DC
Start: 2021-02-28 — End: 2021-03-30

## 2021-02-28 MED ORDER — CYCLOBENZAPRINE HCL 5 MG PO TABS *I*
5.0000 mg | ORAL_TABLET | Freq: Three times a day (TID) | ORAL | 2 refills | Status: DC | PRN
Start: 2021-02-28 — End: 2021-03-30

## 2021-02-28 MED ORDER — FENTANYL 25 MCG/HR TD PT72 *I*
1.0000 | MEDICATED_PATCH | TRANSDERMAL | 0 refills | Status: DC
Start: 2021-02-28 — End: 2021-03-30

## 2021-02-28 MED ORDER — ESOMEPRAZOLE MAGNESIUM 40 MG PO CPDR *I*
40.0000 mg | DELAYED_RELEASE_CAPSULE | Freq: Every day | ORAL | 0 refills | Status: DC
Start: 2021-02-28 — End: 2021-03-30

## 2021-02-28 MED ORDER — FLUOXETINE HCL 20 MG PO CAPS *I*
20.0000 mg | ORAL_CAPSULE | Freq: Every day | ORAL | 1 refills | Status: DC
Start: 2021-02-28 — End: 2021-03-30

## 2021-03-04 MED ORDER — FLUTICASONE PROPIONATE 50 MCG/ACT NA SUSP *I*
1.0000 | Freq: Every day | NASAL | 5 refills | Status: DC
Start: 2021-03-04 — End: 2021-06-02

## 2021-03-04 NOTE — Telephone Encounter (Signed)
Medication refilled

## 2021-03-30 ENCOUNTER — Other Ambulatory Visit: Payer: Self-pay | Admitting: Primary Care

## 2021-03-31 MED ORDER — FLUOXETINE HCL 20 MG PO CAPS *I*
20.0000 mg | ORAL_CAPSULE | Freq: Every day | ORAL | 1 refills | Status: DC
Start: 2021-03-31 — End: 2021-04-29

## 2021-03-31 MED ORDER — METHYLPHENIDATE HCL 5 MG PO TABS *I*
5.0000 mg | ORAL_TABLET | Freq: Two times a day (BID) | ORAL | 0 refills | Status: DC
Start: 2021-03-31 — End: 2021-04-29

## 2021-03-31 MED ORDER — CYCLOBENZAPRINE HCL 5 MG PO TABS *I*
5.0000 mg | ORAL_TABLET | Freq: Three times a day (TID) | ORAL | 2 refills | Status: DC | PRN
Start: 2021-03-31 — End: 2021-06-02

## 2021-03-31 MED ORDER — ESOMEPRAZOLE MAGNESIUM 40 MG PO CPDR *I*
40.0000 mg | DELAYED_RELEASE_CAPSULE | Freq: Every day | ORAL | 0 refills | Status: DC
Start: 2021-03-31 — End: 2021-06-02

## 2021-03-31 MED ORDER — FENTANYL 25 MCG/HR TD PT72 *I*
1.0000 | MEDICATED_PATCH | TRANSDERMAL | 0 refills | Status: DC
Start: 2021-03-31 — End: 2021-04-29

## 2021-04-01 ENCOUNTER — Telehealth: Payer: Self-pay | Admitting: Primary Care

## 2021-04-01 NOTE — Telephone Encounter (Signed)
Pharmacy: [facility] in [place]     Refill(s) Requested: deltasone

## 2021-04-01 NOTE — Telephone Encounter (Signed)
Tried calling patient, no answer.  Mailbox is full so unable to leave message.

## 2021-04-01 NOTE — Telephone Encounter (Signed)
This should be sent to clinical to triage. There is no clear information here for me to triage. Location? Sx? What is concern? What is he asking for? Did he mispeak? Not clear.

## 2021-04-01 NOTE — Telephone Encounter (Signed)
Patient needs dicloxacillin. He miss spoke. He states this is due to nasal vestibulitis due to his cancer treatments. Please advise.     States he has been using the muprocin ointment but that is not working. He would like to have this sent before end of day.     States he is swollen and red. Thank you

## 2021-04-01 NOTE — Telephone Encounter (Signed)
James Oneill PT  Please advise?

## 2021-04-29 ENCOUNTER — Other Ambulatory Visit: Payer: Self-pay | Admitting: Primary Care

## 2021-04-29 MED ORDER — METHYLPHENIDATE HCL 5 MG PO TABS *I*
5.0000 mg | ORAL_TABLET | Freq: Two times a day (BID) | ORAL | 0 refills | Status: DC
Start: 2021-04-29 — End: 2021-06-02

## 2021-04-29 MED ORDER — FLUOXETINE HCL 20 MG PO CAPS *I*
20.0000 mg | ORAL_CAPSULE | Freq: Every day | ORAL | 1 refills | Status: DC
Start: 2021-04-29 — End: 2021-06-02

## 2021-04-29 MED ORDER — FENTANYL 25 MCG/HR TD PT72 *I*
1.0000 | MEDICATED_PATCH | TRANSDERMAL | 0 refills | Status: DC
Start: 2021-04-29 — End: 2021-06-02

## 2021-04-29 NOTE — Telephone Encounter (Signed)
Last fill 04/05/21 for 28 day supply for both medications

## 2021-05-02 ENCOUNTER — Other Ambulatory Visit: Payer: Self-pay | Admitting: Primary Care

## 2021-06-02 ENCOUNTER — Other Ambulatory Visit: Payer: Self-pay | Admitting: Student in an Organized Health Care Education/Training Program

## 2021-06-02 ENCOUNTER — Other Ambulatory Visit: Payer: Self-pay | Admitting: Primary Care

## 2021-06-02 DIAGNOSIS — J329 Chronic sinusitis, unspecified: Secondary | ICD-10-CM

## 2021-06-02 MED ORDER — CYCLOBENZAPRINE HCL 5 MG PO TABS *I*
5.0000 mg | ORAL_TABLET | Freq: Three times a day (TID) | ORAL | 2 refills | Status: DC | PRN
Start: 2021-06-02 — End: 2021-07-26

## 2021-06-02 MED ORDER — METHYLPHENIDATE HCL 5 MG PO TABS *I*
5.0000 mg | ORAL_TABLET | Freq: Two times a day (BID) | ORAL | 0 refills | Status: DC
Start: 2021-06-02 — End: 2021-07-26

## 2021-06-02 MED ORDER — FENTANYL 25 MCG/HR TD PT72 *I*
1.0000 | MEDICATED_PATCH | TRANSDERMAL | 0 refills | Status: DC
Start: 2021-06-02 — End: 2021-07-26

## 2021-06-02 MED ORDER — ESOMEPRAZOLE MAGNESIUM 40 MG PO CPDR *I*
40.0000 mg | DELAYED_RELEASE_CAPSULE | Freq: Every day | ORAL | 0 refills | Status: DC
Start: 2021-06-02 — End: 2021-06-30

## 2021-06-02 MED ORDER — FLUOXETINE HCL 20 MG PO CAPS *I*
20.0000 mg | ORAL_CAPSULE | Freq: Every day | ORAL | 1 refills | Status: DC
Start: 2021-06-02 — End: 2021-07-26

## 2021-06-02 MED ORDER — FLUTICASONE PROPIONATE 50 MCG/ACT NA SUSP *I*: 1.0000 | Freq: Every day | NASAL | 5 refills | Status: DC

## 2021-06-02 NOTE — Telephone Encounter (Signed)
duragesic fill 05/06/21 for 28 day supply  Ritalin fill 05/06/21 for 28 day supply

## 2021-06-30 ENCOUNTER — Other Ambulatory Visit: Payer: Self-pay | Admitting: Primary Care

## 2021-07-26 ENCOUNTER — Other Ambulatory Visit: Payer: Self-pay | Admitting: Primary Care

## 2021-07-27 MED ORDER — FENTANYL 25 MCG/HR TD PT72 *I*
1.0000 | MEDICATED_PATCH | TRANSDERMAL | 0 refills | Status: DC
Start: 2021-07-27 — End: 2021-09-05

## 2021-07-27 MED ORDER — FLUOXETINE HCL 20 MG PO CAPS *I*
20.0000 mg | ORAL_CAPSULE | Freq: Every day | ORAL | 1 refills | Status: DC
Start: 2021-07-27 — End: 2021-09-01

## 2021-07-27 MED ORDER — CYCLOBENZAPRINE HCL 5 MG PO TABS *I*
5.0000 mg | ORAL_TABLET | Freq: Three times a day (TID) | ORAL | 2 refills | Status: DC | PRN
Start: 2021-07-27 — End: 2021-09-05

## 2021-07-27 MED ORDER — GABAPENTIN 600 MG PO TABLET *I*
600.0000 mg | ORAL_TABLET | Freq: Three times a day (TID) | ORAL | 2 refills | Status: DC
Start: 2021-07-27 — End: 2022-01-13

## 2021-07-27 MED ORDER — METHYLPHENIDATE HCL 5 MG PO TABS *I*
5.0000 mg | ORAL_TABLET | Freq: Two times a day (BID) | ORAL | 0 refills | Status: DC
Start: 2021-07-27 — End: 2021-09-05

## 2021-07-27 MED ORDER — ESOMEPRAZOLE MAGNESIUM 40 MG PO CPDR *I*
40.0000 mg | DELAYED_RELEASE_CAPSULE | Freq: Every day | ORAL | 0 refills | Status: DC
Start: 2021-07-27 — End: 2021-11-11

## 2021-07-27 NOTE — Progress Notes (Signed)
Pt is 5 years 3 months S/P RT to head and neck.    BP (!) 169/99 (BP Location: Right arm, Patient Position: Sitting, Cuff Size: large adult)    Pulse 102    Temp 36.2 C (97.2 F) (Temporal)    Wt 82.6 kg (182 lb 3.2 oz)    SpO2 97%    BMI 26.14 kg/m     Pain    08/01/21 1022   PainSc:   3   PainLoc: Throat       Karnofsky Performance Status: 100% - Normal, no complaints, no evidence of disease    Fatigue: 8 - Severe fatigue    Safety/Protective Mechanisms  Two Patient Identifiers Confirmed?: Yes    Fall Risk  RN assessed patient for risk of falls: Yes  Criteria for Risk of Falls: None         Lab results: 01/20/21  1304   TSH 4.11     Free T4   Date Value Ref Range Status   01/20/2021 1.1 0.9 - 1.7 ng/dL Final       SMOKING:   no    OTALGIA:  no    DYSPHAGIA:  yes - increasingly more difficult. Things get stuck and brings back up.    LARYNGITIS  yes - when speaking a lot.    TASTE CHANGES  no    XEROSTOMIA  yes - baseline    FLUORIDE  No, needs referral to [facility].    ANOREXIA  no    TUBE FEEDINGS:   no       NAUSEA  no    VOMITING  no    HEARING CHANGE no    RECENT IMAGING:   no      Comment:  Last visit with ENT 01/11/2021 with follow up scheduled for 01/10/2022.    Patient and patient visitor complied with masking policy throughout the duration of the appointment.   Writer maintained use of mask and faceshield/eyewear throughout  the duration of the appointment.   Writer was within 6 feet of patient for < 5 minutes due to the nature of the appointment.

## 2021-08-01 ENCOUNTER — Other Ambulatory Visit: Payer: Self-pay

## 2021-08-01 ENCOUNTER — Ambulatory Visit: Payer: Medicare Other | Attending: Radiation Oncology | Admitting: Radiation Oncology

## 2021-08-01 VITALS — BP 169/99 | HR 102 | Temp 97.2°F | Wt 182.2 lb

## 2021-08-01 DIAGNOSIS — Z08 Encounter for follow-up examination after completed treatment for malignant neoplasm: Secondary | ICD-10-CM | POA: Insufficient documentation

## 2021-08-01 DIAGNOSIS — C099 Malignant neoplasm of tonsil, unspecified: Secondary | ICD-10-CM | POA: Insufficient documentation

## 2021-08-01 NOTE — Progress Notes (Signed)
Radiation oncology follow-up clinic note 08/01/21      Patient Identifier:  James Oneill is a 45 y.o. male with cT1N1 left tonsillar cancer. S/p definitve RT in 2017, recurrence in 2018, underwent neck dissection. He is seen in follow up. His treatments were all completed in [place] and is 4 years post recurrence    Oncologic History:   Mar 03, 2016: CT scan showed left jugulodigastric lymphadenopathy.    02/25/2018: CT neck showed no evidence of recurrence.  (And sternocleidomastoid atrophy after interval left neck dissection.    04/07/2018: PET-CT showed Solitary left level II lymph node is enlarged and hypermetabolic.  No primary or distant metastasis was identified.    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     04/2016: definitve RT alone to left tonsil and bilateral neck 70 Gy with Dr. Isidore Moos at [facility].     07/03/2017: neck recurrence, had left modified radical neck dissection. Path reported level IIA 1/19 LN positive with ECE, level 2B 0/17 LN positive. He did not receive any cancer therapy after surgery.    08/23/2018: PET-CT is negative for tumor recurrence.    The patient reports pain starting 3 months after radiation therapy. He was seen at [facility] previously for possible neuromuscular disease. Had calf biopsy which was normal. But EMG showed abnormality.      Previous Oncologic History:  Cancer Staging  Tonsillar cancer  Staging form: Pharynx - HPV-Mediated Oropharynx, AJCC 8th Edition  - Clinical stage from 08/16/2018: Stage I (cT1, cN1, cM0, p16+) - Signed by Bruce Donath, MBBS on 08/16/2018        Current therapy: surveillance    Interval History:    He is doing OK. He was not able to get into [facility] or the speech group we had referred him back to after the last appt. Did see Dr. Sabra Heck in March. No tobacco use. Feels dysphagia a bit more prominent, but weight is up and no overt aspiration    Feels the left sided weakness and spasm type pain is worsening,  and some spreading to the right upper neck as well.     He has not had hemoptysis or tobacco use    He has not reconnected with neurology after his first visits due to concern about ongoing work up.     He did see a dentist he says in [place]    Also has moved to [place] to be with a new partner, who accompanies him on the visit.    Endorses fatigue    Has had recurrent nasal vestibule symptoms with malar erythema and fullness, resolve without antibiotic intervention but has been using nasal lavage as well     Past Medical History:   Diagnosis Date    Cancer     Depression     MVP (mitral valve prolapse)    ,   Past Surgical History:   Procedure Laterality Date    ELBOW FRACTURE SURGERY      GALLBLADDER SURGERY  2019    HERNIA REPAIR  3086    umbilical    NECK SURGERY Left 2018    radical dissection    TONSILLECTOMY  2017    SCC Left tonsil   ,   Social History     Socioeconomic History    Marital status: Divorced     Spouse name: Not on file    Number of children: Not on file    Years of education:  Not on file    Highest education level: Not on file   Tobacco Use    Smoking status: Never Smoker    Smokeless tobacco: Never Used   Substance and Sexual Activity    Alcohol use: Yes     Alcohol/week: 1.0 standard drink     Types: 1 Glasses of wine per week    Drug use: Not Currently    Sexual activity: Not Currently   Other Topics Concern    Not on file   Social History Narrative    Not on file    and   Allergies   Allergen Reactions    Shellfish-Derived Products Hives       ROS  8 pt ROS obtained and negative    Vitals:    08/01/21 1022   BP: (!) 169/99   Pulse: 102   Temp: 36.2 C (97.2 F)   Weight: 82.6 kg (182 lb 3.2 oz)     Pain    08/01/21 1022   PainSc:   3   PainLoc: Throat     Wt Readings from Last 20 Encounters:   08/01/21 82.6 kg (182 lb 3.2 oz)   01/11/21 81.9 kg (180 lb 9.6 oz)   10/18/20 85.3 kg (188 lb)   09/27/20 84.6 kg (186 lb 9.6 oz)   08/20/20 82.6 kg (182 lb)   07/13/20 80.8 kg  (178 lb 1.6 oz)   06/29/20 79.3 kg (174 lb 12.8 oz)   07/09/20 77.1 kg (170 lb)   06/25/20 80.1 kg (176 lb 9.6 oz)   06/21/20 81 kg (178 lb 9.2 oz)   05/21/20 80.8 kg (178 lb 3.2 oz)   05/20/20 81 kg (178 lb 8 oz)   02/24/20 82.1 kg (181 lb)   02/17/20 82.1 kg (181 lb)   02/16/20 79.4 kg (175 lb)   02/09/20 82.6 kg (182 lb)   11/07/19 81.1 kg (178 lb 14.4 oz)   11/05/19 80.3 kg (177 lb)   09/19/19 78.1 kg (172 lb 4 oz)   08/13/19 73.2 kg (161 lb 6.4 oz)       Performance Status: Score-70-Cares for self, unable to do activities or active work.    Physical Exam  Sitting in chair, comfortable  Neck with left sided induration, no edema, no adenopathy, there is left sided atrophy and limitation of abduction of the left shoulder above the head, right range of motion in tact  No trismus, oral mucosa somewhat dry, dentition fair, no mucosal lesions over visualized oral cavity, tongue mobility preserved, pallor over the left tonsillary pillar    Without topical anesthetic into the nares, the flexible laryngoscope was atraumatically inserted into the LEFT nare. The nasal mucosa was adequately hydrated and had some moderate clear discharge with edema. The nasopharynx was clear of any lesions. The visualized pharyngeal wall was clear of any lesions. Base of tongue, vallecula clear but again with thicker, adherent white secretions, epiglottis crisp, no laryngeal or supraglottic lesions, pyriform sinus clear. The scope was then withdrawn      Imaging review:  No new imaging; would like a CT chest     TSH/T4 normal in April     Assessment:  He has no clinical evidence of recurrence and has followed NCCN guideline for surveillance with additional imaging. I would like a repeat CT chest.    He has a complex picture of multiple, and now worsening, neurologic symptoms post his treatments in [place] and we have had difficulty with him  getting to appointments in [place] for speech and dental in the last 1-2 years. We spoke  about CT chest imaging, referral to neurology and PT, medical marijuana considerations for pain, and re-engaging a swallow specialist.    After discussing, his new partner knows a Dr. Marlaine Hind Case at [facility] and we will refer the patient to her for ongoing pain management but also to coordinate transition of care to the [facility] region at their request. We have not ordered CT imaging, PT, or updated labs at this time based on this. I also endorsed obtaining a PCP. The main findings currently are no evidence of recurrence 4 years post therapy but a complex and out of the degree of expected neurologic changes post therapy, and he needs to reconnect with neurology and speech. But in talking on the logistics of getting these accomplished and being in a new location, we agreed on the referral noted above.     Plan:   Continue surveillance monitoring. The patient understands the importance of follow up  We will see him as needed

## 2021-08-16 ENCOUNTER — Telehealth: Payer: Self-pay

## 2021-08-16 NOTE — Telephone Encounter (Signed)
I reached out to James Oneill to make sure James Oneill got a referral to [facility], patient stated that James Oneill is going to see a Dr. Darl Householder instead of Dr.Amy Allen Case on 09/06/21.  I assured him that I sent records and I will be happy to assist if anything more is needed.  James Oneill is appreciative of the follow up call.

## 2021-09-01 ENCOUNTER — Other Ambulatory Visit: Payer: Self-pay | Admitting: Primary Care

## 2021-09-01 MED ORDER — FLUOXETINE HCL 20 MG PO CAPS *I*
20.0000 mg | ORAL_CAPSULE | Freq: Every day | ORAL | 0 refills | Status: DC
Start: 2021-09-01 — End: 2021-11-11

## 2021-09-05 ENCOUNTER — Other Ambulatory Visit: Payer: Self-pay | Admitting: Primary Care

## 2021-09-05 MED ORDER — FENTANYL 25 MCG/HR TD PT72 *I*
1.0000 | MEDICATED_PATCH | TRANSDERMAL | 0 refills | Status: DC
Start: 2021-09-05 — End: 2021-12-05

## 2021-09-05 MED ORDER — METHYLPHENIDATE HCL 5 MG PO TABS *I*
5.0000 mg | ORAL_TABLET | Freq: Two times a day (BID) | ORAL | 0 refills | Status: DC
Start: 2021-09-05 — End: 2021-12-12

## 2021-09-05 MED ORDER — CYCLOBENZAPRINE HCL 5 MG PO TABS *I*
5.0000 mg | ORAL_TABLET | Freq: Three times a day (TID) | ORAL | 2 refills | Status: DC | PRN
Start: 2021-09-05 — End: 2021-11-11

## 2021-09-05 NOTE — Telephone Encounter (Signed)
Last fill 08/02/21 for 28 day supply

## 2021-09-06 ENCOUNTER — Encounter: Payer: Self-pay | Admitting: Gastroenterology

## 2021-10-12 ENCOUNTER — Other Ambulatory Visit: Payer: Self-pay | Admitting: Primary Care

## 2021-10-18 ENCOUNTER — Other Ambulatory Visit: Payer: Self-pay

## 2021-10-21 ENCOUNTER — Other Ambulatory Visit: Payer: Self-pay | Admitting: Primary Care

## 2021-10-24 NOTE — Telephone Encounter (Signed)
Patient has not been seen in a year and has no follow up apt

## 2021-11-11 ENCOUNTER — Encounter: Payer: Self-pay | Admitting: Primary Care

## 2021-11-11 ENCOUNTER — Other Ambulatory Visit: Payer: Self-pay | Admitting: Primary Care

## 2021-11-11 MED ORDER — ESOMEPRAZOLE MAGNESIUM 40 MG PO CPDR *I*
40.0000 mg | DELAYED_RELEASE_CAPSULE | Freq: Every day | ORAL | 0 refills | Status: DC
Start: 2021-11-11 — End: 2022-01-10

## 2021-11-11 MED ORDER — CYCLOBENZAPRINE HCL 5 MG PO TABS *I*
5.0000 mg | ORAL_TABLET | Freq: Three times a day (TID) | ORAL | 2 refills | Status: DC | PRN
Start: 2021-11-11 — End: 2022-01-13

## 2021-11-11 MED ORDER — FLUOXETINE HCL 20 MG PO CAPS *I*
20.0000 mg | ORAL_CAPSULE | Freq: Every day | ORAL | 0 refills | Status: DC
Start: 2021-11-11 — End: 2022-01-13

## 2021-11-22 ENCOUNTER — Telehealth: Payer: Self-pay | Admitting: Primary Care

## 2021-11-22 ENCOUNTER — Encounter: Payer: Self-pay | Admitting: Primary Care

## 2021-11-22 NOTE — Telephone Encounter (Signed)
Patient is aware, and will report to the nearest UC.

## 2021-11-22 NOTE — Telephone Encounter (Signed)
Patient is asking for an antibiotic for facial cellulitis. States that the Doctor has treated him for this before. Possibly cipro. Something so that he isnt worried about being in the sun. Wants this urgently         Pharmacy: [facility] in [place]      He is in [place] now

## 2021-11-22 NOTE — Telephone Encounter (Addendum)
Spoke to pt, he is requesting that an antibotic be sent.  Patient states that he is just visiting down in [place] and does not have transportation to get around, also does not know where an UC is.  Patient is being polite when asking.  Patient states that he has been treated for this before.

## 2021-11-23 ENCOUNTER — Encounter: Payer: Self-pay | Admitting: Gastroenterology

## 2021-11-23 NOTE — ED Provider Notes (Signed)
EMERGENCY DEPARTMENT PHYSICIAN NOTE  [facility]    Room: E20/E20  Name: James Oneill  Age/Gender: 46 y.o. male  MRN: [id number]  PCP: PROVIDER, PERSONALIZATION    History Provided By: Patient.     History of Present Illness     Chief Complaint   Patient presents with   . Hypertension     Cancer patient patient sent by urgent care for hypertension. Pt additionally has an abcess on face that was being treated  denies chest pain        James Oneill is a 46 y.o. male with past medical history of CA and mitral valve prolapse (not on any anti-coagulants) who presents to the emergency department via a referral from an Urgent Care he visited earlier today for management of a chronic abscess. Was referred over for an abnl heart rhythm. Says they saw a couple of PVCs. Feels palpitations with lying down on occasion. Denies any fever, nausea, vomiting, diarrhea, rectal bleeding, hematuria, dysuria, cough, SOB, back pain, or chest pain    Onset: Just PTA  Duration: New  Location: Generalized chest  Quality: Palpitations  Severity: moderate  Alleviating Factors: None  Exacerbating Factors: Lying flat down      History provided by:  Patient and medical records  Language interpreter used: No         Medical History     Past Medical History:   Diagnosis Date   . Cancer (CMS/HCC) [facility])        Past Surgical History:   Procedure Laterality Date   . NO PAST SURGERIES         Adult Social History:   Social History     Tobacco Use   . Smoking status: Never   . Smokeless tobacco: Never   Substance Use Topics   . Alcohol use: Not Currently   . Drug use: Never        Review of Systems     Review of Systems   Constitutional: Negative for chills and fever.   HENT: Negative for ear pain and sore throat.    Eyes: Negative for pain and visual disturbance.   Respiratory: Negative for cough and shortness of breath.    Cardiovascular: Positive for palpitations. Negative for chest pain.   Gastrointestinal: Negative for  abdominal pain and vomiting.   Genitourinary: Negative for dysuria and hematuria.   Musculoskeletal: Negative for arthralgias and back pain.   Skin: Negative for color change and rash.   Neurological: Negative for seizures and syncope.   All other systems reviewed and are negative.       Physical Exam     ED Triage Vitals [11/23/21 1112]   Temp Heart Rate Resp BP   36.8 C (98.2 F) 88 19 148/103      SpO2 Temp Source Heart Rate Source Patient Position   98 % Oral Monitor Lying      BP Location FiO2 (%) Oxygen Therapy O2 Flow Rate (L/min)   Right arm -- -- --      O2 Delivery Method      --          Physical Exam  Vitals and nursing note reviewed.   Constitutional:       General: He is not in acute distress.     Appearance: He is well-developed.   HENT:      Head: Normocephalic and atraumatic.   Eyes:      Conjunctiva/sclera: Conjunctivae normal.   Cardiovascular:  Rate and Rhythm: Normal rate and regular rhythm.      Heart sounds: No murmur heard.  Pulmonary:      Effort: Pulmonary effort is normal. No respiratory distress.      Breath sounds: Normal breath sounds.   Abdominal:      Palpations: Abdomen is soft.      Tenderness: There is no abdominal tenderness.   Musculoskeletal:         General: No swelling.      Cervical back: Neck supple.   Skin:     General: Skin is warm and dry.      Capillary Refill: Capillary refill takes less than 2 seconds.   Neurological:      Mental Status: He is alert.   Psychiatric:         Mood and Affect: Mood normal.         Vitals:    11/23/21 1102 11/23/21 1112 11/23/21 1324   BP:  148/103 150/98   BP Location:  Right arm Right arm   Patient Position:  Lying Sitting   Pulse:  88 79   Resp:  19 15   Temp:  36.8 C (98.2 F) 36.4 C (97.6 F)   TempSrc:  Oral Oral   SpO2:  98% 100%   Weight: 81.6 kg (180 lb)     Height: 1.778 m (5\' 10" )          ED Medication Administration     Allergies   Allergen Reactions   . Shellfish-Derived Products         Medications - No data to  display     Medical Decision Making   Medical Decision Making  Patient is a 46 year old male with a past medical history of cancer presenting to ED due to hypertension and palpitations.  When patient arrived, patient's blood pressure was 148/103, all other vitals are within normal limits.  O2 saturations 90% on room air.  Patient appears well and has no focal neurological deficits.  Patient was only mildly hypertensive.  Lab work was done demonstrating that patient has no evidence of any end-organ damage.  Creatinine is within normal limits.  Given the patient was also presented with palpitations, EKG was performed. EKG was performed and interpreted by myself.  EKG demonstrated patient has no evidence arrhythmia that could be causing palpitations.  EKG demonstrates no atrial fibrillation, atrial flutter, SVT, HOCM, WPW, bifascicular/AV block, changes in PR/QRS/QTC interval, epsilon wave, Brugada syndrome, or any other arrhythmias not otherwise specified.  No ST segment elevations or depressions.  Troponins within normal limits.  TSH and free T4 are within normal limits.  D-dimer is within normal limits and it is unlikely that patient has a PE.  At this time, I do believe that patient is stable for discharge.  Patient will be given follow-up to cardiologist.  Shared decision-making was made and patient was offered cardiac workup however at this time, I do believe that patient is stable for discharge and patient states that he would like to follow up outpatient.  Prior to discharge, patient was comfortable.  Patient will be given follow-up to PCP and Cardiology.  Patient was advised that if patient starts having any worsening symptoms, he should return to emergency department within the next 24-48 hours.      Hypertension, unspecified type: acute illness or injury that poses a threat to life or bodily functions  Amount and/or Complexity of Data Reviewed  Labs: ordered. Decision-making details documented in ED  Course.  Radiology: ordered and independent interpretation performed. Decision-making details documented in ED Course.     Details: Chest x-ray was done and interpreted by myself demonstrating the patient has no acute cardiopulmonary process  ECG/medicine tests: ordered and independent interpretation performed. Decision-making details documented in ED Course.     Details: EKG was performed and interpreted by myself demonstrating the patient has no ST segment elevations or depressions, no evidence of any arrhythmias      Risk  Decision regarding hospitalization.         Differential Diagnosis: Hypertension - Hypertensive emergency, Hypertensive urgency, Asymptomatic hypertension, Pain or Others  Palpitations - Arrhythmia, Anemia, Metabolic abnormality, Dehydration or Others      ED Course as of 11/23/21 1533   Wed Nov 23, 2021   1115 I discussed the treatment plan during bedside evaluation after obtaining the patient's History and Physical examination. The current plan of care was agreed upon.   [JM]   1234 Patient in NAD, resting in ED. Symptoms improved at this time. Informed patient of findings, recommended supportive care at home, and advised PCP follow-up in addition to ED return precautions. Patient understands and agrees with plan. Plan to discharge.  [JM]      ED Course User Index  [JM] [address] Camacho         Diagnosis as of 11/23/21 1533   Hypertension, unspecified type         MDM - Data     External Documents Reviewed: Not Applicable.      ECG 12 lead   Final Result by Jacinta Shoe, MD [phone] [id number])                             [facility]                                         og                                          Test Date:    2021-11-23   [facility] Name:     James Oneill             Department:   [id number]   Patient ID:   [id number]                Room:         E20 (STAT)   Gender:       Male                     Technician:   [facility]   DOB:          October 19, 1975                Requested By: Jacinta Shoe   Order Number: [id number]                Reading MD:   Jacinta Shoe                                    Measurements   Intervals  Axis             Rate:         84                       P:            35   PR:           148                      QRS:          52   QRSD:         105                      T:            -15   QT:           350                                       QTc:          391                                                                  Interpretive Statements   SINUS RHYTHM   HR 84 BPM   NORMAL AXIS   NO STEMI   Electronically Signed On 11-23-2021 11:22:34 EST by Jacinta Shoe          Imaging:   When ordered, my X-ray interpretations are entered directly into the results under ED Interpretation.    XR Chest 1 View   ED Interpretation   No acute cardiopulmonary process      Final Result        No lung consolidation.        Possible trace right pleural effusion.        Heart size appears within normal limits allowing for technique.        Created by: Leandrew Koyanagi     Signed by: Leandrew Koyanagi     Signed on: 11/23/2021 12:40 EST     Location: [id number]                       My ED Imaging Interpretation: See ED Interpretation or ED Course.    Test Interpretation discussed with: Not applicable    Laboratory Testing:  When ordered, all labs reviewed and interpreted by the Emergency Clinician.    Labs Reviewed   CBC W/AUTO DIFF, REFLEX MANUAL DIFF IF INDICATED - Abnormal       Result Value    WBC 4.86      RBC 4.51      Hemoglobin 14.1      Hematocrit 39.2      MCV 86.9      MCH 31.3      MCHC 36.0      RDW 12.2      Platelet Count 240      MPV 10.0      Neutrophils % 67.6      Lymphocytes % 19.1 (*)  Monocytes % 11.3      Eosinophils % 1.4      Basophils % 0.4      Neutrophils Absolute 3.28      Lymphocytes Absolute 0.93 (*)     Monocytes Absolute 0.55      Eosinophils Absolute 0.07      Basophil Absolute 0.02     COMPREHENSIVE METABOLIC  PANEL - Abnormal    Sodium 138      Potassium 4.6      Chloride 100      Carbon Dioxide 30.0      Anion Gap 8      BUN 16.0      Creatinine 1.00      Glucose 103 (*)     Calcium 9.9      AST 27      ALT 27      Alkaline Phosphatase 64      Protein, Total 7.6      Albumin 4.80      Globulin 2.8      A/G Ratio 1.7      Bilirubin, Total 1.00      eGFR 93.4     LIPASE - Normal    Lipase 30     TROPONIN T QUANT HIGH SENSITIVITY - Normal    Troponin T, High Sensitivity <6     N-TERMINAL PROBNP - Normal    N Terminal Pro BNP 18.0     TSH 3RD GENERATION - Normal    TSH, 3rd Gen 4.220     T4, FREE - Normal    T4, Free 0.99     D-DIMER, QUANTITATIVE - Normal    D-Dimer 0.39         EM Clinical Decision Rules/Scoring: Not Applicable    Not applicable.    SHARED DECISION MAKING WAS MADE AND PATIENT WAS AGREEABLE FOR Discharge    Patient management discussed with: patient or guardian    Procedures     Procedures    Consultations     None.      Disposition     Not Applicable.    There are no discharge medications for this patient.       Disposition:  Disposition: DISCHARGE to home   Patient's condition is Stable and Improved      ED Follow up:  Primary Care Physician  Follow up with your primary care physican after ED visit for follow up on labs and radiology. All incidental findings have been shared with you and should be reviewed with your primary care doctor. You can access your records on the [facility] portal  Schedule an appointment as soon as possible for a visit       [facility]  [address]  [place] [address]  [phone]  Schedule an appointment as soon as possible for a visit   If symptoms worsen, As needed    [facility] at [facility]  [address]  [address]  [place] [address]  [phone]          12:36 PM     Disposition>>> Discharge Home  Based on all current results and findings it has been found  that this patient has no emergent medical condition and is able to be discharged home at this time. All pertinent information has been provided in the patient's discharge instructions. Extensively reviewed treatment plan and discharge instructions with patient. Addressed all patient concerns at this time. Patient made  aware of what symptoms to monitor for that would warrant a return to the ED. Patient demonstrates verbal understanding and agreement of discharge and follow up instructions. Patient will be discharged home for outpatient follow up.     Patient and/or caregiver indicated understanding of instructions. I have reviewed nursing notes, emergency department records and prior records available. I have completed an appropriate physical examination, work-up and evaluation of the patient's complaint. Overall, patient appears well, non-toxic, well-hydrated, with no significant vital sign abnormality, and no focal neuro deficits. There is no clinical evidence of any unaddressed emergent life or limb threatening condition and no indication for further interventions in the emergency department. I feel comfortable discharging the patient with outpatient care/treatment of today's diagnosis with symptomatic management, prescriptions if needed, close PCP follow-up and strict return precautions.       I have spoken with the patient and/or caregiver and I have explained with the patient condition, diagnosis and treatment plan based on the information available to me at this time. I have answered the patient and/or caregivers questions and addressed any concerns. They have a good understanding of the patient's diagnosis, condition and treatment plan as can be expected at this point. The patient's condition is stable and appropriate for discharge from the emergency department.      Patient will pursue further outpatient evaluation with the primary care physician or other designated or consulting physician as outlined in the  discharge instructions. The patient and/or caregivers are agreeable to this plan of care and follow up instructions have been explained in detail. The patient and/or caregivers have received discharge instructions in written format and have expressed and understanding of these discharge instructions.      They are aware that while patient may be appropriate for discharge at this time, some medical emergencies may only develop or become detectable after a period of time. I specifically instructed patient and/or caregivers that any significant change in condition or worsening of symptoms should prompt immediate return to this or the closest emergency department or they may call 911. The patient voiced understanding and agreement.     Social Determinants of Health significantly limiting diagnosis, treatment or plan of care compliance: Not applicable.     Attestations     A scribe was used to perform this documentation. Scribe Attestation: I, Christinia Gully, have acted as Neurosurgeon for and in the presence of DALCHAND, Stage manager Attestation: I personally performed the services in this documentation. All medical record entries made by the scribe were at my direction & in my presence. I have reviewed the chart & agree the record reflects my personal performance & is accurate & complete.          Jacinta Shoe, MD  11/26/21 2212

## 2021-12-05 ENCOUNTER — Ambulatory Visit: Payer: Medicare Other | Attending: Primary Care | Admitting: Primary Care

## 2021-12-05 ENCOUNTER — Other Ambulatory Visit: Payer: Self-pay

## 2021-12-05 ENCOUNTER — Encounter: Payer: Self-pay | Admitting: Primary Care

## 2021-12-05 VITALS — BP 140/100 | HR 109 | Temp 97.3°F | Ht 70.0 in | Wt 184.1 lb

## 2021-12-05 DIAGNOSIS — F32A Depression, unspecified: Secondary | ICD-10-CM | POA: Insufficient documentation

## 2021-12-05 DIAGNOSIS — I10 Essential (primary) hypertension: Secondary | ICD-10-CM | POA: Insufficient documentation

## 2021-12-05 DIAGNOSIS — G540 Brachial plexus disorders: Secondary | ICD-10-CM | POA: Insufficient documentation

## 2021-12-05 MED ORDER — METOPROLOL SUCCINATE 25 MG PO TB24 *I*
25.0000 mg | ORAL_TABLET | Freq: Every day | ORAL | 5 refills | Status: DC
Start: 2021-12-05 — End: 2021-12-06

## 2021-12-05 MED ORDER — HYDROCODONE-ACETAMINOPHEN 5-325 MG PO TABS *I*
1.0000 | ORAL_TABLET | Freq: Four times a day (QID) | ORAL | 0 refills | Status: DC | PRN
Start: 2021-12-05 — End: 2021-12-06

## 2021-12-05 NOTE — Progress Notes (Signed)
Patient ID: James Oneill is a 46 y.o. year old male.    Chief Complaint   Patient presents with    Follow-up     meds     HPI: James Oneill is here today for reevaluation of his chronic upper extremity pain, depression, fatigue and borderline hypertension  He is in a new relationship.  He spends half his time in [place].  Half his time in [place].  He stopped taking his fentanyl about 3 weeks ago.  He felt it was making him tired.  He has had increased pain in his upper extremities since then.  He would prefer to switch to something oral for as needed use.  He fell and sustained a hematoma of his left forearm about 2 weeks ago.  In the emergency room he was noted to be tachycardic him to be having multiple PVCs.  He brought the ECG showing.  He is unaware of these.  He does have a history of mitral valve prolapse.  Because of the tachycardia he stopped his Ritalin.  He reports that he is more fatigued since that time.  He wants to go to bed by about 5 or 6:00 in the evening.  He has a very poor libido.  Mood however is stable        The medication and allergy list was reviewed and is accurate  Allergy / Social History / Medications:  Allergies   Allergen Reactions    Shellfish-Derived Products Hives     Social History     Tobacco Use    Smoking status: Never    Smokeless tobacco: Never   Substance Use Topics    Alcohol use: Yes     Alcohol/week: 1.0 standard drink     Types: 1 Glasses of wine per week     Patient's Medications   New Prescriptions    No medications on file   Previous Medications    BENZONATATE (TESSALON) 200 MG CAPSULE    Take 1 capsule (200 mg total) by mouth 3 times daily as needed    BISACODYL (BISACODYL) 5 MG EC TABLET    Take 5 mg by mouth daily as needed for Constipation    CYCLOBENZAPRINE (FLEXERIL) 5 MG TABLET    Take 1 tablet (5 mg total) by mouth 3 times daily as needed for Muscle spasms    ESOMEPRAZOLE MAGNESIUM (NEXIUM) 40 MG CAPSULE    Take 1 capsule (40 mg total) by mouth daily  (before breakfast)    FENTANYL (DURAGESIC) 25 MCG/HR PATCH    Apply 1 patch onto the skin every 48 hours  Max daily dose: 1 patch Remove & discard patch after 48 hours.    FLUOXETINE (PROZAC) 20 MG CAPSULE    Take 1 capsule (20 mg total) by mouth daily    FLUTICASONE (FLONASE) 50 MCG/ACT NASAL SPRAY    Spray 1 spray into nostril daily    GABAPENTIN (GABAPENTIN) 600 MG TABLET    Take 1 tablet (600 mg total) by mouth 3 times daily    METHYLPHENIDATE (RITALIN) 5 MG TABLET    Take 1 tablet (5 mg total) by mouth 2 times daily (before meals)  Max daily dose: 10 mg    MUPIROCIN (BACTROBAN) 2 % OINTMENT    Apply topically 3 times daily  for nasal vestibulitis to the following a2reas: nasal vestibule    POLYETHYLENE GLYCOL (GLYCOLAX) 17 GM/SCOOP POWDER    Take 17 g by mouth daily  Mix in 8 oz water or  juice and drink.   Modified Medications    No medications on file   Discontinued Medications    No medications on file          Physical Exam:  Vitals:    12/05/21 1415   BP: (!) 140/100   Pulse: 109   Temp: 36.3 C (97.3 F)   Weight: 83.5 kg (184 lb 1.6 oz)   Height: 1.778 m (5\' 10" )     SpO2 Readings from Last 3 Encounters:   12/05/21 97%   08/01/21 97%   01/11/21 100%      Estimated body mass index is 26.42 kg/m as calculated from the following:    Height as of this encounter: 1.778 m (5\' 10" ).    Weight as of this encounter: 83.5 kg (184 lb 1.6 oz).  BP Readings from Last 3 Encounters:   12/05/21 (!) 140/100   08/01/21 (!) 169/99   01/11/21 (!) 164/100     Wt Readings from Last 3 Encounters:   12/05/21 83.5 kg (184 lb 1.6 oz)   08/01/21 82.6 kg (182 lb 3.2 oz)   01/11/21 81.9 kg (180 lb 9.6 oz)     Looks well.  Not in any acute distress  Neck-supple no adenopathy or thyromegaly  Cardiovascular-heart rateir regular.  Heart sounds 1 and 2 with no added sounds no carotid bruits no edema  Respiratory-lungs are clear to palpation percussion and auscultation    Recent Lab Results:none      Assessment and Plan:     1.  Essential  hypertension  2.  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  Depression with fatigue      Start metoprolol 25 mg XL at bedtime.  Recommend restarting the Ritalin.  Hydrocodone/acetaminophen up to 4 times a day as needed for pain    Plan follow-up in 4 weeks  Orders this visit  No orders of the defined types were placed in this encounter.

## 2021-12-06 ENCOUNTER — Telehealth: Payer: Self-pay | Admitting: Primary Care

## 2021-12-06 MED ORDER — HYDROCODONE-ACETAMINOPHEN 5-325 MG PO TABS *I*
1.0000 | ORAL_TABLET | Freq: Four times a day (QID) | ORAL | 0 refills | Status: DC | PRN
Start: 2021-12-06 — End: 2022-01-13

## 2021-12-06 MED ORDER — METOPROLOL SUCCINATE 25 MG PO TB24 *I*
25.0000 mg | ORAL_TABLET | Freq: Every day | ORAL | 5 refills | Status: DC
Start: 2021-12-06 — End: 2021-12-30

## 2021-12-06 NOTE — Telephone Encounter (Signed)
Pt is stating his two medication yesterday should have been sent [facility]    But he needs this sent to [facility] in [place]       Please review and send

## 2021-12-06 NOTE — Telephone Encounter (Signed)
Orders pended, pharmacy updated

## 2021-12-12 ENCOUNTER — Other Ambulatory Visit: Payer: Self-pay | Admitting: Primary Care

## 2021-12-12 MED ORDER — METHYLPHENIDATE HCL 5 MG PO TABS *I*
5.0000 mg | ORAL_TABLET | Freq: Two times a day (BID) | ORAL | 0 refills | Status: DC
Start: 2021-12-12 — End: 2022-01-09

## 2021-12-12 NOTE — Telephone Encounter (Signed)
Pharmacy: [facility] in [place]     Refill(s) Requested: Methylphenidate     Date of next appointment: Future Encounters      01/09/2022  2:15 PM Sch    FOLLOW UP VISIT    Derry Lory, MD

## 2021-12-27 ENCOUNTER — Encounter: Payer: Self-pay | Admitting: Gastroenterology

## 2021-12-30 ENCOUNTER — Other Ambulatory Visit: Payer: Self-pay

## 2021-12-30 MED ORDER — METOPROLOL SUCCINATE 25 MG PO TB24 *I*
25.0000 mg | ORAL_TABLET | Freq: Every day | ORAL | 1 refills | Status: DC
Start: 2021-12-30 — End: 2022-03-07

## 2021-12-30 NOTE — Telephone Encounter (Signed)
Requesting 90 day supply.

## 2022-01-09 ENCOUNTER — Ambulatory Visit: Payer: Medicare Other | Attending: Primary Care | Admitting: Primary Care

## 2022-01-09 ENCOUNTER — Telehealth: Payer: Self-pay | Admitting: Primary Care

## 2022-01-09 ENCOUNTER — Other Ambulatory Visit: Payer: Self-pay

## 2022-01-09 ENCOUNTER — Encounter: Payer: Self-pay | Admitting: Primary Care

## 2022-01-09 VITALS — BP 132/88 | HR 71 | Temp 97.2°F | Ht 70.0 in | Wt 188.0 lb

## 2022-01-09 DIAGNOSIS — I10 Essential (primary) hypertension: Secondary | ICD-10-CM

## 2022-01-09 DIAGNOSIS — R5383 Other fatigue: Secondary | ICD-10-CM

## 2022-01-09 DIAGNOSIS — F32A Depression, unspecified: Secondary | ICD-10-CM | POA: Insufficient documentation

## 2022-01-09 DIAGNOSIS — K529 Noninfective gastroenteritis and colitis, unspecified: Secondary | ICD-10-CM | POA: Insufficient documentation

## 2022-01-09 MED ORDER — METHYLPHENIDATE HCL 10 MG PO TABS *I*
10.0000 mg | ORAL_TABLET | Freq: Two times a day (BID) | ORAL | 0 refills | Status: DC
Start: 2022-01-09 — End: 2022-04-28

## 2022-01-09 MED ORDER — CHOLESTYRAMINE 4 GM PO PACK *I*
1.0000 | PACK | Freq: Two times a day (BID) | ORAL | 1 refills | Status: DC
Start: 2022-01-09 — End: 2022-02-07

## 2022-01-09 NOTE — Telephone Encounter (Signed)
Order for Overnight Oximetry faxed to [facility] in [place], confirmation received.

## 2022-01-09 NOTE — Progress Notes (Addendum)
Patient ID: James Oneill is a 46 y.o. year old male.    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  Hypertension  No reports of chest pain palpitations dyspnea orthopnea or PND  He does complain of fatigue.  He finds difficult to motivate himself to do anything.  He has been on methylphenidate 5 mg twice a day for the past few years.  Originally this was prescribed by his oncologist to give him some energy.  Initially he had a good response to this.  He reports ongoing chronic diarrhea.  He says that this started after he had a cholecystectomy a few years ago.  If he takes Imodium then that causes unbearable constipation.  In addition to fatigue he reports poor libido and erectile difficulties.  This is putting a strain on his relationship with his girlfriend.  The brachial plexus neuropathy pain remains unchanged despite stopping fentanyl.  He does not even take hydrocodone on a regular basis any longer as he wants to try and get away from opiates.  Still struggles with his mood.  When he is away from home he wants to be at home and when he is at home he wants to be away from home.      The medication and allergy list was reviewed and is accurate  Allergy / Social History / Medications:  Allergies   Allergen Reactions    Shellfish-Derived Products Hives     Social History     Tobacco Use    Smoking status: Never    Smokeless tobacco: Never   Vaping Use    Vaping status: Not on file   Substance Use Topics    Alcohol use: Yes     Alcohol/week: 1.0 standard drink of alcohol     Types: 1 Glasses of wine per week     Patient's Medications   New Prescriptions    CHOLESTYRAMINE (QUESTRAN) 4 G PACKET    Take 4 g by mouth 2 times daily (with meals)    METHYLPHENIDATE (RITALIN) 10 MG TABLET    Take 1 tablet (10 mg total) by mouth 2 times daily (before meals)  Max daily dose: 20 mg    Previous Medications    CYCLOBENZAPRINE (FLEXERIL) 5 MG TABLET    Take 1 tablet (5 mg total) by mouth 3 times daily as needed for Muscle spasms    ESOMEPRAZOLE MAGNESIUM (NEXIUM) 40 MG CAPSULE    Take 1 capsule (40 mg total) by mouth daily (before breakfast)    FLUOXETINE (PROZAC) 20 MG CAPSULE    Take 1 capsule (20 mg total) by mouth daily    FLUTICASONE (FLONASE) 50 MCG/ACT NASAL SPRAY    Spray 1 spray into nostril daily    GABAPENTIN (GABAPENTIN) 600 MG TABLET    Take 1 tablet (600 mg total) by mouth 3 times daily    HYDROCODONE-ACETAMINOPHEN (NORCO) 5-325 MG PER TABLET    Take 1 tablet by mouth every 6 hours as needed for Pain  Max daily dose: 4 tablets    METOPROLOL SUCCINATE ER (TOPROL-XL) 25 MG 24 HR TABLET    Take 1 tablet (25 mg total) by mouth daily  Do not crush or chew. May be divided.    MUPIROCIN (BACTROBAN) 2 % OINTMENT    Apply topically 3 times daily  for nasal vestibulitis to  the following a2reas: nasal vestibule    POLYETHYLENE GLYCOL (GLYCOLAX) 17 GM/SCOOP POWDER    Take 17 g by mouth daily  Mix in 8 oz water or juice and drink.   Modified Medications    No medications on file   Discontinued Medications    BISACODYL (BISACODYL) 5 MG EC TABLET    Take 5 mg by mouth daily as needed for Constipation    METHYLPHENIDATE (RITALIN) 5 MG TABLET    Take 1 tablet (5 mg total) by mouth 2 times daily (before meals)  Max daily dose: 10 mg          Physical Exam:  Vitals:    01/09/22 1413   BP: 132/88   Pulse: 71   Temp: 36.2 C (97.2 F)   Weight: 85.3 kg (188 lb)   Height: 1.778 m ('5\' 10"'$ )     SpO2 Readings from Last 3 Encounters:   01/09/22 96%   12/05/21 97%   08/01/21 97%      Estimated body mass index is 26.98 kg/m as calculated from the following:    Height as of this encounter: 1.778 m ('5\' 10"'$ ).    Weight as of this encounter: 85.3 kg (188 lb).  BP Readings from Last 3 Encounters:   01/09/22 132/88   12/05/21 (!) 140/100   08/01/21 (!) 169/99     Wt Readings from Last 3 Encounters:   01/09/22 85.3  kg (188 lb)   12/05/21 83.5 kg (184 lb 1.6 oz)   08/01/21 82.6 kg (182 lb 3.2 oz)     Looks well.  Not in any acute distress  Mouth/Mallampati class III  Neck-supple no adenopathy or thyromegaly  Cardiovascular-heart rate regular.  Heart sounds 1 and 2 with no added sounds no carotid bruits no edema  Respiratory-lungs are clear to palpation percussion and auscultation    Recent Lab Results:none      Assessment and Plan:     1.  Brachial plexus neuropathy  2.  Fatigue.  Check overnight oximetry.  Increase methylphenidate to 10 mg twice a day  3.  Poor libido.  Check testosterone level  4.  Hypertension.  Blood pressure at goal  5.  Chronic diarrhea.  Trial of cholestyramine initially 2 g twice a day.  Increasing to 4 g twice a day if needed  6.  Depression.  Recommend reevaluating for causes of fatigue before adjusting his antidepressant  Orders this visit  Orders Placed This Encounter   Procedures    Testosterone, total and free    Room air during Overnight Oximetry     [facility] Hypertension Plan    Based on this patients clinical history and according to JNC guidelines target BP goal is: less than 140/90 (or 150/90 due to age [facility]))  Based on the patient's last BP of BP: 132/88 the patient is: at goal  The plan to reach goal   1.  Reviewed pt's understanding of medications including any barriers to adherence.   2.  Recommended lifestyle modifications: DASH eating plan, dietary sodium reduction and medication compliance   3.  The patient understands their clinical goals and will undertake self-management recommendations including:DASH eating plan  dietary sodium reduction  medication compliance     4.  Medication Management: no changes made    5.  Referral to Care Management:: No   6.  Patient Ed/Self-management tools provided: Yes

## 2022-01-10 ENCOUNTER — Telehealth: Payer: Self-pay

## 2022-01-10 ENCOUNTER — Other Ambulatory Visit: Payer: Self-pay

## 2022-01-10 ENCOUNTER — Ambulatory Visit: Payer: Medicare Other | Admitting: Otolaryngology

## 2022-01-10 MED ORDER — ESOMEPRAZOLE MAGNESIUM 40 MG PO CPDR *I*
40.0000 mg | DELAYED_RELEASE_CAPSULE | Freq: Every day | ORAL | 2 refills | Status: DC
Start: 2022-01-10 — End: 2022-03-07

## 2022-01-10 NOTE — Telephone Encounter (Signed)
Calling per Dr. Sabra Heck team to get pt resch for missed fuv today. Left my name and [facility] number

## 2022-01-12 ENCOUNTER — Telehealth: Payer: Self-pay

## 2022-01-12 NOTE — Telephone Encounter (Signed)
Calling per Dr. Sabra Heck team to get pt resch. Left my name and [facility] number

## 2022-01-13 ENCOUNTER — Other Ambulatory Visit: Payer: Self-pay | Admitting: Primary Care

## 2022-01-13 MED ORDER — CYCLOBENZAPRINE HCL 5 MG PO TABS *I*
5.0000 mg | ORAL_TABLET | Freq: Three times a day (TID) | ORAL | 2 refills | Status: DC | PRN
Start: 2022-01-13 — End: 2022-03-07

## 2022-01-13 MED ORDER — HYDROCODONE-ACETAMINOPHEN 5-325 MG PO TABS *I*
1.0000 | ORAL_TABLET | Freq: Four times a day (QID) | ORAL | 0 refills | Status: DC | PRN
Start: 2022-01-13 — End: 2022-03-07

## 2022-01-13 MED ORDER — FLUOXETINE HCL 20 MG PO CAPS *I*
20.0000 mg | ORAL_CAPSULE | Freq: Every day | ORAL | 0 refills | Status: DC
Start: 2022-01-13 — End: 2022-03-07

## 2022-01-13 MED ORDER — GABAPENTIN 600 MG PO TABLET *I*
600.0000 mg | ORAL_TABLET | Freq: Three times a day (TID) | ORAL | 2 refills | Status: DC
Start: 2022-01-13 — End: 2022-04-28

## 2022-01-23 ENCOUNTER — Encounter: Payer: Self-pay | Admitting: Primary Care

## 2022-02-07 ENCOUNTER — Other Ambulatory Visit: Payer: Self-pay

## 2022-02-07 ENCOUNTER — Ambulatory Visit: Payer: Medicare Other | Admitting: Primary Care

## 2022-02-08 MED ORDER — CHOLESTYRAMINE 4 GM PO PACK *I*
1.0000 | PACK | Freq: Two times a day (BID) | ORAL | 1 refills | Status: DC
Start: 2022-02-08 — End: 2023-08-20

## 2022-02-21 ENCOUNTER — Ambulatory Visit: Payer: Medicare Other | Admitting: Otolaryngology

## 2022-02-23 ENCOUNTER — Telehealth: Payer: Self-pay | Admitting: Primary Care

## 2022-02-23 NOTE — Telephone Encounter (Addendum)
Received letter in the mail today return to sender with no forwarding address.     Please try and call patient to update demographics.

## 2022-03-07 ENCOUNTER — Other Ambulatory Visit: Payer: Self-pay | Admitting: Primary Care

## 2022-03-07 MED ORDER — FLUOXETINE HCL 20 MG PO CAPS *I*
20.0000 mg | ORAL_CAPSULE | Freq: Every day | ORAL | 0 refills | Status: DC
Start: 2022-03-07 — End: 2022-04-28

## 2022-03-07 MED ORDER — CYCLOBENZAPRINE HCL 5 MG PO TABS *I*
5.0000 mg | ORAL_TABLET | Freq: Three times a day (TID) | ORAL | 2 refills | Status: DC | PRN
Start: 2022-03-07 — End: 2022-04-28

## 2022-03-07 MED ORDER — METOPROLOL SUCCINATE 25 MG PO TB24 *I*
25.0000 mg | ORAL_TABLET | Freq: Every day | ORAL | 1 refills | Status: DC
Start: 2022-03-07 — End: 2022-04-28

## 2022-03-07 MED ORDER — ESOMEPRAZOLE MAGNESIUM 40 MG PO CPDR *I*
40.0000 mg | DELAYED_RELEASE_CAPSULE | Freq: Every day | ORAL | 2 refills | Status: DC
Start: 2022-03-07 — End: 2022-03-18

## 2022-03-07 MED ORDER — HYDROCODONE-ACETAMINOPHEN 5-325 MG PO TABS *I*
1.0000 | ORAL_TABLET | Freq: Four times a day (QID) | ORAL | 0 refills | Status: DC | PRN
Start: 2022-03-07 — End: 2022-04-28

## 2022-03-18 ENCOUNTER — Other Ambulatory Visit: Payer: Self-pay | Admitting: Primary Care

## 2022-03-20 MED ORDER — ESOMEPRAZOLE MAGNESIUM 40 MG PO CPDR *I*
40.0000 mg | DELAYED_RELEASE_CAPSULE | Freq: Every day | ORAL | 2 refills | Status: DC
Start: 2022-03-20 — End: 2022-04-28

## 2022-03-20 NOTE — Telephone Encounter (Signed)
Patient has not been seen since March, and no showed last appointment, and has not up coming appointment scheduled.       Would you like to refill?

## 2022-04-07 ENCOUNTER — Telehealth: Payer: Self-pay

## 2022-04-07 NOTE — Telephone Encounter (Signed)
Attempted to reach patient on both home and mobile phone numbers, both phones go to VM and VM box full, unable to leave message. Will send MyChart message.

## 2022-04-28 ENCOUNTER — Other Ambulatory Visit: Payer: Self-pay | Admitting: Primary Care

## 2022-04-28 MED ORDER — METHYLPHENIDATE HCL 10 MG PO TABS *I*
10.0000 mg | ORAL_TABLET | Freq: Two times a day (BID) | ORAL | 0 refills | Status: DC
Start: 2022-04-28 — End: 2022-06-13

## 2022-04-28 MED ORDER — CYCLOBENZAPRINE HCL 5 MG PO TABS *I*
5.0000 mg | ORAL_TABLET | Freq: Three times a day (TID) | ORAL | 2 refills | Status: DC | PRN
Start: 2022-04-28 — End: 2022-06-13

## 2022-04-28 MED ORDER — FLUOXETINE HCL 20 MG PO CAPS *I*
20.0000 mg | ORAL_CAPSULE | Freq: Every day | ORAL | 0 refills | Status: DC
Start: 2022-04-28 — End: 2022-06-13

## 2022-04-28 MED ORDER — GABAPENTIN 600 MG PO TABLET *I*
600.0000 mg | ORAL_TABLET | Freq: Three times a day (TID) | ORAL | 2 refills | Status: DC
Start: 2022-04-28 — End: 2022-06-13

## 2022-04-28 MED ORDER — HYDROCODONE-ACETAMINOPHEN 5-325 MG PO TABS *I*
1.0000 | ORAL_TABLET | Freq: Four times a day (QID) | ORAL | 0 refills | Status: DC | PRN
Start: 2022-04-28 — End: 2022-06-13

## 2022-04-28 MED ORDER — ESOMEPRAZOLE MAGNESIUM 40 MG PO CPDR *I*
40.0000 mg | DELAYED_RELEASE_CAPSULE | Freq: Every day | ORAL | 2 refills | Status: DC
Start: 2022-04-28 — End: 2022-06-13

## 2022-04-28 MED ORDER — METOPROLOL SUCCINATE 25 MG PO TB24 *I*
25.0000 mg | ORAL_TABLET | Freq: Every day | ORAL | 1 refills | Status: DC
Start: 2022-04-28 — End: 2022-06-13

## 2022-04-28 NOTE — Telephone Encounter (Signed)
Pt requesting Ritalin - not sent since March FYI

## 2022-05-01 ENCOUNTER — Telehealth: Payer: Self-pay | Admitting: Primary Care

## 2022-05-01 NOTE — Telephone Encounter (Signed)
MEDICATION PRIOR AUTH REQUIRED    PA KEY: [id number]     MEDICATION: Methylphenidate HCI    DOSAGE: 10mg     IS THIS A NEW DOSAGE?: (yes or no): no      CLINICAL INDICATION FOR MEDICATION (diagnosis): ADHD

## 2022-06-01 ENCOUNTER — Encounter: Payer: Self-pay | Admitting: Primary Care

## 2022-06-01 ENCOUNTER — Encounter: Payer: Self-pay | Admitting: Radiation Oncology

## 2022-06-01 NOTE — Telephone Encounter (Signed)
I have tried to reach patient the voice mail is full unable to leave a message.  My extension is [phone]

## 2022-06-09 NOTE — Progress Notes (Signed)
Pt is 6 years 1 months S/P RT to head and neck.    BP 145/79 (BP Location: Left arm, Patient Position: Sitting, Cuff Size: large adult)   Pulse 79   Temp 36.6 ?C (97.8 ?F) (Temporal)   Wt 80.1 kg (176 lb 9.6 oz)   SpO2 98%   BMI 25.34 kg/m?     Pain    06/13/22 1033   PainSc:   3       Karnofsky Performance Status: 90% - Able to carry out normal activity, minor signs or symptoms of disease    Fatigue: 7 - Severe fatigue    Safety/Protective Mechanisms  Two Patient Identifiers Confirmed?: Yes    Fall Risk  RN assessed patient for risk of falls: Yes  Criteria for Risk of Falls: (!) History of falls in the last 3 months  Patient at Risk for Falls: (!) Yes  Patient is able to comprehend/follow instructions: Yes  Caregiver is able to care for the patient: N/A  Patient requires physical therapy/occupational therapy consult: No  Patient is compliant with activity restrictions: Yes  Fall Risk Interventions: Patient assisted on/off exam tables, Patient educated on prevention of falls   TSH, 3rd Gen 0.400 - 4.500 u[IU]/mL 4.220    Resulting Agency  [facility]     Specimen Collected: 11/23/21 11:29 Last Resulted: 11/23/21 12:19   Received From: [facility]  Result Received: 11/29/21 15:15      Ref Range & Units 6 mo ago   T4, Free 0.58 - 1.64 ng/dL 1.61    Resulting Agency  [facility]     Specimen Collected: 11/23/21 11:29 Last Resulted: 11/23/21 12:19   Received From: [facility]  Result Received: 11/29/21 15:15       SMOKING:   no    OTALGIA:  no    DYSPHAGIA:  yes - has trouble with dry foods, uses liquids. Some things get stck    LARYNGITIS  yes - varies    TASTE CHANGES  yes - feels muted    XEROSTOMIA  yes     FLUORIDE  no    ANOREXIA  no    Last weight   06/13/22 80.1 kg (176 lb 9.6 oz)   01/09/22 85.3 kg (188 lb)   12/05/21 83.5 kg (184 lb 1.6 oz)   08/01/21 82.6 kg (182 lb 3.2 oz)   01/11/21 81.9 kg (180 lb 9.6 oz)   10/18/20 85.3 kg (188 lb)   09/27/20 84.6 kg (186 lb 9.6  oz)   08/20/20 82.6 kg (182 lb)   07/13/20 80.8 kg (178 lb 1.6 oz)   06/29/20 79.3 kg (174 lb 12.8 oz)         TUBE FEEDINGS:   no       NAUSEA  no    VOMITING  no    HEARING CHANGE no    RECENT IMAGING:   no      Comment:

## 2022-06-13 ENCOUNTER — Other Ambulatory Visit: Payer: Self-pay | Admitting: Primary Care

## 2022-06-13 ENCOUNTER — Encounter: Payer: Self-pay | Admitting: Primary Care

## 2022-06-13 ENCOUNTER — Other Ambulatory Visit
Admission: RE | Admit: 2022-06-13 | Discharge: 2022-06-13 | Disposition: A | Discharge status: Home / Self Care | Payer: Medicare Other | Source: Ambulatory Visit

## 2022-06-13 ENCOUNTER — Ambulatory Visit: Payer: Medicare Other | Attending: Radiation Oncology | Admitting: Radiation Oncology

## 2022-06-13 ENCOUNTER — Other Ambulatory Visit: Payer: Self-pay | Admitting: Otolaryngology

## 2022-06-13 ENCOUNTER — Ambulatory Visit: Payer: Medicare Other | Attending: Primary Care | Admitting: Primary Care

## 2022-06-13 ENCOUNTER — Other Ambulatory Visit: Payer: Self-pay

## 2022-06-13 ENCOUNTER — Encounter: Payer: Self-pay | Admitting: Radiation Oncology

## 2022-06-13 VITALS — BP 144/118 | HR 80 | Temp 97.4°F | Ht 70.0 in | Wt 176.1 lb

## 2022-06-13 VITALS — BP 145/79 | HR 79 | Temp 97.8°F | Wt 176.6 lb

## 2022-06-13 DIAGNOSIS — I10 Essential (primary) hypertension: Secondary | ICD-10-CM | POA: Insufficient documentation

## 2022-06-13 DIAGNOSIS — R6882 Decreased libido: Secondary | ICD-10-CM | POA: Insufficient documentation

## 2022-06-13 DIAGNOSIS — Z0000 Encounter for general adult medical examination without abnormal findings: Secondary | ICD-10-CM | POA: Insufficient documentation

## 2022-06-13 DIAGNOSIS — B009 Herpesviral infection, unspecified: Secondary | ICD-10-CM | POA: Insufficient documentation

## 2022-06-13 DIAGNOSIS — R002 Palpitations: Secondary | ICD-10-CM | POA: Insufficient documentation

## 2022-06-13 DIAGNOSIS — Z08 Encounter for follow-up examination after completed treatment for malignant neoplasm: Secondary | ICD-10-CM

## 2022-06-13 LAB — COMPREHENSIVE METABOLIC PANEL
ALT: 13 U/L (ref 0–50)
AST: 29 U/L (ref 0–50)
Albumin: 5.2 g/dL (ref 3.5–5.2)
Alk Phos: 60 U/L (ref 40–130)
Anion Gap: 11 (ref 7–16)
Bilirubin,Total: 1.8 mg/dL — ABNORMAL HIGH (ref 0.0–1.2)
CO2: 30 mmol/L — ABNORMAL HIGH (ref 20–28)
Calcium: 11 mg/dL — ABNORMAL HIGH (ref 8.6–10.2)
Chloride: 98 mmol/L (ref 96–108)
Creatinine: 1.13 mg/dL (ref 0.67–1.17)
Glucose: 95 mg/dL (ref 60–99)
Lab: 15 mg/dL (ref 6–20)
Potassium: 4.7 mmol/L — ABNORMAL HIGH (ref 3.3–4.6)
Sodium: 139 mmol/L (ref 133–145)
Total Protein: 7.9 g/dL — ABNORMAL HIGH (ref 6.3–7.7)
eGFR BY CREAT: 80 *

## 2022-06-13 LAB — LIPID PANEL
Chol/HDL Ratio: 4.5
Cholesterol: 226 mg/dL — AB
HDL: 50 mg/dL (ref 40–60)
LDL Calculated: 106 mg/dL
Non HDL Cholesterol: 176 mg/dL
Triglycerides: 352 mg/dL — AB

## 2022-06-13 LAB — TESTOSTERONE (ADULT MALES OR INDIVIDUALS ON TESTOSTERONE HORMONE THERAPY)
Testosterone,% Free: 2 %
Testosterone,Free: 83 pg/mL (ref 47–244)
Testosterone: 414 ng/dL (ref 249–836)

## 2022-06-13 LAB — T4, FREE: Free T4: 1.3 ng/dL (ref 0.9–1.7)

## 2022-06-13 LAB — TSH: TSH: 5.74 u[IU]/mL — ABNORMAL HIGH (ref 0.27–4.20)

## 2022-06-13 LAB — SEX HORMONE BINDING GLOBULIN: Sex Hormone Binding Glob: 28 nmol/L (ref 10–80)

## 2022-06-13 LAB — MAGNESIUM: Magnesium: 2 mg/dL (ref 1.6–2.5)

## 2022-06-13 MED ORDER — ESOMEPRAZOLE MAGNESIUM 40 MG PO CPDR *I*
40.0000 mg | DELAYED_RELEASE_CAPSULE | Freq: Every day | ORAL | 2 refills | Status: DC
Start: 2022-06-13 — End: 2022-07-13

## 2022-06-13 MED ORDER — CYCLOBENZAPRINE HCL 5 MG PO TABS *I*
5.0000 mg | ORAL_TABLET | Freq: Three times a day (TID) | ORAL | 2 refills | Status: DC | PRN
Start: 2022-06-13 — End: 2022-07-13

## 2022-06-13 MED ORDER — METOPROLOL-HYDROCHLOROTHIAZIDE 50-25 MG PO TABS *A*
1.0000 | ORAL_TABLET | Freq: Every morning | ORAL | 1 refills | Status: DC
Start: 2022-06-13 — End: 2022-06-23

## 2022-06-13 MED ORDER — VALACYCLOVIR HCL 500 MG PO TABS *I*
500.0000 mg | ORAL_TABLET | Freq: Two times a day (BID) | ORAL | 0 refills | Status: DC
Start: 2022-06-13 — End: 2022-06-13

## 2022-06-13 MED ORDER — VALACYCLOVIR HCL 500 MG PO TABS *I*
500.0000 mg | ORAL_TABLET | Freq: Two times a day (BID) | ORAL | 0 refills | Status: AC
Start: 2022-06-13 — End: 2022-06-27

## 2022-06-13 MED ORDER — METHYLPHENIDATE HCL 10 MG PO TABS *I*
10.0000 mg | ORAL_TABLET | Freq: Two times a day (BID) | ORAL | 0 refills | Status: DC
Start: 2022-06-13 — End: 2022-07-02

## 2022-06-13 MED ORDER — MUPIROCIN 2 % EX OINT *I*
TOPICAL_OINTMENT | Freq: Three times a day (TID) | CUTANEOUS | 0 refills | Status: DC
Start: 2022-06-13 — End: 2022-06-27

## 2022-06-13 MED ORDER — HYDROCODONE-ACETAMINOPHEN 5-325 MG PO TABS *I*
1.0000 | ORAL_TABLET | Freq: Four times a day (QID) | ORAL | 0 refills | Status: DC | PRN
Start: 2022-06-13 — End: 2022-07-13

## 2022-06-13 MED ORDER — GABAPENTIN 600 MG PO TABLET *I*
600.0000 mg | ORAL_TABLET | Freq: Three times a day (TID) | ORAL | 2 refills | Status: DC
Start: 2022-06-13 — End: 2022-09-05

## 2022-06-13 NOTE — Progress Notes (Signed)
Radiation oncology follow-up clinic note 06/13/22      Patient Identifier:  James Oneill is a 46 y.o.  male with cT1N1 left tonsillar cancer. S/p definitve RT in 2017, recurrence in 2018, underwent neck dissection. He is seen in follow up. His treatments were all completed in [place] and is 5 years post recurrence    Oncologic History:   Mar 03, 2016: CT scan showed left jugulodigastric lymphadenopathy.    02/25/2018: CT neck showed no evidence of recurrence.  (And sternocleidomastoid atrophy after interval left neck dissection.    04/07/2018: PET-CT showed Solitary left level II lymph node is enlarged and hypermetabolic.  No primary or distant metastasis was identified.    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     04/2016: definitve RT alone to left tonsil and bilateral neck 70 Gy with Dr. Basilio [facility] at [facility].     07/03/2017: neck recurrence, had left modified radical neck dissection. Path reported level IIA 1/19 LN positive with ECE, level 2B 0/17 LN positive. He did not receive any cancer therapy after surgery.    08/23/2018: PET-CT is negative for tumor recurrence.    The patient reports pain starting 3 months after radiation therapy. He was seen at [facility] previously for possible neuromuscular disease. Had calf biopsy which was normal. But EMG showed abnormality.      Previous Oncologic History:   Cancer Staging   Tonsillar cancer  Staging form: Pharynx - HPV-Mediated Oropharynx, AJCC 8th Edition  - Clinical stage from 08/16/2018: Stage I (cT1, cN1, cM0, p16+) - Signed by Julian Reil, MBBS on 08/16/2018        Current therapy: surveillance    Interval History:    Lives in [place] now. No new neck pain, masses, hemoptysis, otalgia, weight loss. Has worsening neuropathy he feels with more fasciculations and episodic right jaw angle pain which can be severe.    Also recurrent infection type symptoms over the left nasal vestibule which extends over the malar region and then  resolves, but occurs a few times per year    No tobacco use      Past Medical History:   Diagnosis Date   ? Cancer    ? Depression    ? MVP (mitral valve prolapse)    ,   Past Surgical History:   Procedure Laterality Date   ? ELBOW FRACTURE SURGERY     ? GALLBLADDER SURGERY  2019   ? HERNIA REPAIR  2019    umbilical   ? NECK SURGERY Left 2018    radical dissection   ? TONSILLECTOMY  2017    SCC Left tonsil   ,   Social History     Socioeconomic History   ? Marital status: Divorced   Tobacco Use   ? Smoking status: Never   ? Smokeless tobacco: Never   Substance and Sexual Activity   ? Alcohol use: Yes     Alcohol/week: 1.0 standard drink of alcohol     Types: 1 Glasses of wine per week   ? Drug use: Not Currently   ? Sexual activity: Not Currently    and   Allergies   Allergen Reactions   ? Shellfish-Derived Products Hives       ROS  8 pt ROS obtained and negative    Vitals:    06/13/22 1033   BP: 145/79   Pulse: 79   Temp: 36.6 ?C (97.8 ?F)   Weight: 80.1 kg (176  lb 9.6 oz)     Pain    06/13/22 1033   PainSc:   3     Wt Readings from Last 20 Encounters:   06/13/22 79.9 kg (176 lb 1.6 oz)   06/13/22 80.1 kg (176 lb 9.6 oz)   01/09/22 85.3 kg (188 lb)   12/05/21 83.5 kg (184 lb 1.6 oz)   08/01/21 82.6 kg (182 lb 3.2 oz)   01/11/21 81.9 kg (180 lb 9.6 oz)   10/18/20 85.3 kg (188 lb)   09/27/20 84.6 kg (186 lb 9.6 oz)   08/20/20 82.6 kg (182 lb)   07/13/20 80.8 kg (178 lb 1.6 oz)   06/29/20 79.3 kg (174 lb 12.8 oz)   07/09/20 77.1 kg (170 lb)   06/25/20 80.1 kg (176 lb 9.6 oz)   06/21/20 81 kg (178 lb 9.2 oz)   05/21/20 80.8 kg (178 lb 3.2 oz)   05/20/20 81 kg (178 lb 8 oz)   02/24/20 82.1 kg (181 lb)   02/17/20 82.1 kg (181 lb)   02/16/20 79.4 kg (175 lb)   02/09/20 82.6 kg (182 lb)       Performance Status: Score-70-Cares for self, unable to do activities or active work.    Physical Exam  Sitting in chair, comfortable  Neck with left sided induration, no edema, no adenopathy, there is left sided atrophy and more  fasciculations   No trismus, oral mucosa somewhat dry, dentition fair, no mucosal lesions over visualized oral cavity, tongue mobility preserved, pallor over the left tonsillar pillar, no exposed mandible, no lesions in the right mandible region or on the neck  Raised erythematous foliculitis type lesion over the left nasal opening    Without topical anesthetic into the nares, the flexible laryngoscope was atraumatically inserted into the RIGHT nare. The nasal mucosa was adequately hydrated and had some moderate clear discharge with edema. The nasopharynx was clear of any lesions. The visualized pharyngeal wall was clear of any lesions. Base of tongue, vallecula clear but again with thicker, adherent white secretions, epiglottis crisp, no laryngeal or supraglottic lesions, pyriform sinus clear. The scope was then withdrawn      Imaging review:  No new imaging; would like a CT chest     TSH/T4 normal in April     Assessment:  No evidence of recurrence, now 5 years post recurrence and considered cure    Unclear nasal lesion, valtrex sent, would like him to see Dr. Jackelyn Hoehn term he would benefit from year ENT and neurology follow up, we will see him as needed      Plan:  RTC as needed

## 2022-06-13 NOTE — Telephone Encounter (Signed)
duplicate

## 2022-06-13 NOTE — Progress Notes (Signed)
Patient ID: James Oneill is a 46 y.o. year old male.    Chief Complaint   Patient presents with   ? Annual Exam     PE     HPI:  James Oneill is here today for an annual physical.  He spent the last few months hiking around the [place].  His depression has resolved.  He stopped taking the fluoxetine.  He continues on the Ritalin for his ADD.  He reports his diarrhea has been much better on the cholestyramine.  He has some ongoing concerns  He reports a poor libido.  He has severe erectile dysfunction.  Unfortunately these issues caused his relationship to fail.  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  This usually responds well to Valtrex and mupirocin ointment.  Since he was touring in the [place] he has developed episodes of palpitations.  These occur on almost daily basis.  No aggravating or relieving factors.  They can occur with exercise or at rest.  He feels his heart pounding.  No associated chest pain or dyspnea  He has chronic upper extremity numbness and tingling dating from his radiotherapy.    Blood pressure is grossly elevated today.  He does not use any illicit drugs    Past medical and surgical history was reviewed with him    Family and social history was reviewed with him      The medication and allergy list was reviewed and is accurate  Allergy / Social History / Medications:  Allergies   Allergen Reactions   ? Shellfish-Derived Products Hives     Social History     Tobacco Use   ? Smoking status: Never   ? Smokeless tobacco: Never   Substance Use Topics   ? Alcohol use: Yes     Alcohol/week: 1.0 standard drink of alcohol     Types: 1 Glasses of wine per week     Patient's Medications   New Prescriptions    METOPROLOL-HYDROCHLOROTHIAZIDE (LOPRESSOR HCT) 50-25 MG PER TABLET    Take 1 tablet by mouth every morning    MUPIROCIN (BACTROBAN) 2 % OINTMENT    Apply topically 3 times daily  for skin inf to the following areas: rash on face   Previous  Medications    CHOLESTYRAMINE (QUESTRAN) 4 G PACKET    Take 4 g by mouth 2 times daily (with meals)    CYCLOBENZAPRINE (FLEXERIL) 5 MG TABLET    Take 1 tablet (5 mg total) by mouth 3 times daily as needed for Muscle spasms    ESOMEPRAZOLE MAGNESIUM (NEXIUM) 40 MG CAPSULE    Take 1 capsule (40 mg total) by mouth daily (before breakfast)    FLUTICASONE (FLONASE) 50 MCG/ACT NASAL SPRAY    Spray 1 spray into nostril daily    GABAPENTIN 600 MG TABLET    Take 1 tablet (600 mg total) by mouth 3 times daily    HYDROCODONE-ACETAMINOPHEN (NORCO) 5-325 MG PER TABLET    Take 1 tablet by mouth every 6 hours as needed for Pain  Max daily dose: 4 tablets    METHYLPHENIDATE (RITALIN) 10 MG TABLET    Take 1 tablet (10 mg total) by mouth 2 times daily (before meals)  Max daily dose: 20 mg    MUPIROCIN (BACTROBAN) 2 % OINTMENT    Apply topically 3 times daily  for nasal vestibulitis to the following a2reas: nasal vestibule    POLYETHYLENE GLYCOL (GLYCOLAX)  17 GM/SCOOP POWDER    Take 17 g by mouth daily  Mix in 8 oz water or juice and drink.   Modified Medications    Modified Medication Previous Medication    VALACYCLOVIR (VALTREX) 500 MG TABLET valACYclovir (VALTREX) 500 mg tablet       Take 1 tablet (500 mg total) by mouth 2 times daily for 14 days    Take 1 tablet (500 mg total) by mouth 2 times daily for 14 days   Discontinued Medications    FLUOXETINE (PROZAC) 20 MG CAPSULE    Take 1 capsule (20 mg total) by mouth daily    METOPROLOL SUCCINATE ER (TOPROL-XL) 25 MG 24 HR TABLET    Take 1 tablet (25 mg total) by mouth daily  Do not crush or chew. May be divided.          Physical Exam:  Vitals:    06/13/22 1157   BP: (!) 144/118   Pulse: 80   Temp: 36.3 ?C (97.4 ?F)   Weight: 79.9 kg (176 lb 1.6 oz)   Height: 1.778 m (5\' 10" )     SpO2 Readings from Last 3 Encounters:   06/13/22 98%   06/13/22 98%   01/09/22 96%      Estimated body mass index is 25.27 kg/m? as calculated from the following:    Height as of this encounter: 1.778 m (5'  10").    Weight as of this encounter: 79.9 kg (176 lb 1.6 oz).  BP Readings from Last 3 Encounters:   06/13/22 (!) 144/118   06/13/22 145/79   01/09/22 132/88     Wt Readings from Last 3 Encounters:   06/13/22 79.9 kg (176 lb 1.6 oz)   06/13/22 80.1 kg (176 lb 9.6 oz)   01/09/22 85.3 kg (188 lb)     Looks well.  Not in any acute distress  Head-normocephalic  ENT-normal apart from a papule on the left nares.  Mouth is normal  Eyes-PERRLA.  Fundi normal  Neck-supple no adenopathy or thyromegaly  Cardiovascular-heart rate regular.  Heart sounds 1 and 2 with no added sounds no carotid bruits no edema  Respiratory-lungs are clear to palpation percussion and auscultation  Abdomen is soft without masses or tenderness bowel sounds normal  Neurological-sensation tone power grossly normal and symmetrical    Recent Lab Results:none      Assessment and Plan:     1.  Annual physical.  Recommend lipids  2.  Hypertension.  Blood pressure is not at goal.  Switch to metoprolol 50/hydrochlorothiazide 12.5  3.  Palpitations.  48-hour Holter monitor ordered  4.  Recurrent herpes simplex.  Prescription for valacyclovir and mupirocin sent to pharmacy for him  5.  Poor libido and erectile dysfunction.  Check testosterone levels    Follow-up in 4 weeks  Orders this visit  Orders Placed This Encounter   Procedures   ? Lipid Panel (Reflex to Direct  LDL if Triglycerides more than 400)   ? Comprehensive metabolic panel   ? Magnesium   ? TSH   ? Free and Total Testosterone (Total 09-1499 ng/dL; Free 0.02-40.00 pg/mL)   ? ePatch 2 Day Monitor

## 2022-06-14 ENCOUNTER — Telehealth: Payer: Self-pay | Admitting: Primary Care

## 2022-06-14 NOTE — Telephone Encounter (Signed)
Blood work was okay.  Testosterone level was normal.  His triglycerides are quite high.  I would recommend that he follows a low-fat diet and we will recheck blood work in 3 months

## 2022-06-14 NOTE — Telephone Encounter (Signed)
Patient notified and is concerned about his TSH.

## 2022-06-14 NOTE — Telephone Encounter (Signed)
Patient notified and verbalized understanding. 

## 2022-06-21 ENCOUNTER — Telehealth: Payer: Self-pay

## 2022-06-21 NOTE — Telephone Encounter (Signed)
Writer attempted to reach patient to get him scheduled for a follow up apt with Dr. Hyacinth Meeker, no answer and mailbox full. Will try again soon.

## 2022-06-23 ENCOUNTER — Telehealth: Payer: Self-pay | Admitting: Primary Care

## 2022-06-23 ENCOUNTER — Telehealth: Payer: Self-pay

## 2022-06-23 ENCOUNTER — Ambulatory Visit
Admission: RE | Admit: 2022-06-23 | Discharge: 2022-06-23 | Disposition: A | Discharge status: Home / Self Care | Payer: Medicare Other | Source: Ambulatory Visit | Attending: Primary Care | Admitting: Primary Care

## 2022-06-23 DIAGNOSIS — R002 Palpitations: Secondary | ICD-10-CM

## 2022-06-23 MED ORDER — METOPROLOL-HYDROCHLOROTHIAZIDE 100-25 MG PO TABS *A*
1.0000 | ORAL_TABLET | Freq: Every morning | ORAL | 1 refills | Status: DC
Start: 2022-06-23 — End: 2022-07-02

## 2022-06-23 NOTE — Telephone Encounter (Signed)
Please let patient know blood pressure still dangerously high I am going to increase his new medicine that Dr. Prentiss Bells him on to 100/25 1 daily I sent in a new prescription and he needs his blood pressure check next week.  If he gets chest pain or shortness of breath he will need to be evaluated in the ER

## 2022-06-23 NOTE — Telephone Encounter (Signed)
Misty calling stating she was doing baseline vitals for his monitor and BP on left arm is 200/122 and on the right it was 184/108 states he was started on metoprolol four days ago. Just need to let us know, misty states he is not symptomatic right now.

## 2022-06-23 NOTE — Telephone Encounter (Signed)
Spoke with patient, offered FUV with Dr. Hyacinth Meeker on 9/12 at 12:45, pt confirmed.

## 2022-06-23 NOTE — Telephone Encounter (Signed)
Misty from the hospital calling she is putting a heart monitor on the patient and he has abnormal bp. Called and transferred to [facility]

## 2022-06-23 NOTE — Telephone Encounter (Signed)
Patient verbalizes understanding of below information.

## 2022-06-27 ENCOUNTER — Other Ambulatory Visit: Payer: Self-pay

## 2022-06-27 ENCOUNTER — Ambulatory Visit: Payer: Medicare Other | Admitting: Otolaryngology

## 2022-06-27 VITALS — BP 170/116 | HR 108 | Temp 98.4°F | Resp 17 | Ht 70.0 in | Wt 176.9 lb

## 2022-06-27 DIAGNOSIS — J3489 Other specified disorders of nose and nasal sinuses: Secondary | ICD-10-CM

## 2022-06-27 DIAGNOSIS — C109 Malignant neoplasm of oropharynx, unspecified: Secondary | ICD-10-CM

## 2022-06-27 MED ORDER — MUPIROCIN 2 % EX OINT *I*
TOPICAL_OINTMENT | Freq: Three times a day (TID) | CUTANEOUS | 0 refills | Status: DC
Start: 2022-06-27 — End: 2022-07-02

## 2022-06-27 MED ORDER — DOXYCYCLINE HYCLATE 100 MG PO CAPS *I*
100.0000 mg | ORAL_CAPSULE | Freq: Two times a day (BID) | ORAL | 1 refills | Status: DC
Start: 2022-06-27 — End: 2022-07-11

## 2022-06-27 NOTE — Progress Notes (Signed)
James Oneill returns for followup. Currently James Oneill had no head and neck complaints. James Oneill does report a recent bout of nasal vestibulitis which resolved with mupirocin and antivirals. James Oneill develops these episodes 2-3x per year. There are no inciting, aggravating, or alleviating factors. James Oneill also reports occasional blurred vision. James Oneill is being evaluated for hypertension which to this point has been refractory and idiopathic.     A complete and comprehensive otolaryngologic examination including the ears, nose, mouth, throat, and neck was performed.   Blood pressure (!) 170/116, pulse 108, temperature 36.9 C (98.4 F), resp. rate 17, height 1.778 m (5\' 10" ), weight 80.2 kg (176 lb 14.4 oz), SpO2 99 %.   Examination of the neck reveals postoperative and radiation changes. The incisions are well-healed. There is no adenopathy. Normal tracheal and laryngeal crepitus is intact. The ears are clear. The oral cavity, oropharynx, nasal cavity, nasopharynx, hypopharynx and larynx are unremarkable. There is no sign of recurrent disease. The remainder of my examination revealed no significant abnormalities.      James Oneill is doing well from a cancer standpoint. 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 We discussed preventive strategies and have given him a standing order for bactroban and doxycycline. James Oneill is now living in [place] part time, so James Oneill will bring these with him. His vision changes may be related to his hypertension, but given the history of neck irradiaiton I have ordered a baseline carotid ultrasound. I will contact him with the results.

## 2022-06-28 ENCOUNTER — Telehealth: Payer: Self-pay

## 2022-06-28 ENCOUNTER — Encounter: Payer: Self-pay | Admitting: Primary Care

## 2022-06-28 NOTE — Telephone Encounter (Signed)
writer called pt to see if he was able to make it this coming friday 9/15 for his [place] at [place]. James Oneill. pt confirmed scan for 06/30/22 @ 1:45 pm. pt instructed to enter main enterance for appt & there was no prep for exam. pt verbalized understanding

## 2022-06-28 NOTE — Telephone Encounter (Signed)
I don't see the results of the monitor in the chart yet.

## 2022-06-29 NOTE — Telephone Encounter (Signed)
Still no results at this time.

## 2022-06-30 ENCOUNTER — Encounter: Payer: Medicare Other | Admitting: Cardiology

## 2022-06-30 ENCOUNTER — Emergency Department: Payer: Medicare Other

## 2022-06-30 ENCOUNTER — Other Ambulatory Visit: Payer: Self-pay

## 2022-06-30 ENCOUNTER — Ambulatory Visit
Admission: RE | Admit: 2022-06-30 | Discharge: 2022-06-30 | Disposition: A | Discharge status: Home / Self Care | Payer: Medicare Other | Source: Ambulatory Visit | Attending: Otolaryngology | Admitting: Otolaryngology

## 2022-06-30 ENCOUNTER — Ambulatory Visit: Payer: Medicare Other

## 2022-06-30 ENCOUNTER — Telehealth: Payer: Self-pay

## 2022-06-30 ENCOUNTER — Observation Stay
Admission: EM | Admit: 2022-06-30 | Discharge: 2022-07-02 | Disposition: A | Discharge status: Home / Self Care | Payer: Medicare Other | Source: Ambulatory Visit | Attending: Internal Medicine | Admitting: Internal Medicine

## 2022-06-30 DIAGNOSIS — I161 Hypertensive emergency: Secondary | ICD-10-CM

## 2022-06-30 DIAGNOSIS — I160 Hypertensive urgency: Principal | ICD-10-CM | POA: Insufficient documentation

## 2022-06-30 DIAGNOSIS — C109 Malignant neoplasm of oropharynx, unspecified: Secondary | ICD-10-CM | POA: Insufficient documentation

## 2022-06-30 DIAGNOSIS — R519 Headache, unspecified: Secondary | ICD-10-CM

## 2022-06-30 DIAGNOSIS — I440 Atrioventricular block, first degree: Secondary | ICD-10-CM

## 2022-06-30 DIAGNOSIS — R000 Tachycardia, unspecified: Secondary | ICD-10-CM

## 2022-06-30 DIAGNOSIS — G540 Brachial plexus disorders: Secondary | ICD-10-CM | POA: Insufficient documentation

## 2022-06-30 DIAGNOSIS — H538 Other visual disturbances: Secondary | ICD-10-CM

## 2022-06-30 DIAGNOSIS — R5382 Chronic fatigue, unspecified: Secondary | ICD-10-CM | POA: Diagnosis present

## 2022-06-30 DIAGNOSIS — I10 Essential (primary) hypertension: Secondary | ICD-10-CM | POA: Diagnosis present

## 2022-06-30 DIAGNOSIS — G629 Polyneuropathy, unspecified: Secondary | ICD-10-CM | POA: Diagnosis present

## 2022-06-30 DIAGNOSIS — Z79899 Other long term (current) drug therapy: Secondary | ICD-10-CM | POA: Insufficient documentation

## 2022-06-30 DIAGNOSIS — R079 Chest pain, unspecified: Secondary | ICD-10-CM

## 2022-06-30 DIAGNOSIS — I341 Nonrheumatic mitral (valve) prolapse: Secondary | ICD-10-CM | POA: Insufficient documentation

## 2022-06-30 DIAGNOSIS — F32A Depression, unspecified: Secondary | ICD-10-CM | POA: Insufficient documentation

## 2022-06-30 DIAGNOSIS — N529 Male erectile dysfunction, unspecified: Secondary | ICD-10-CM | POA: Diagnosis present

## 2022-06-30 DIAGNOSIS — R9431 Abnormal electrocardiogram [ECG] [EKG]: Secondary | ICD-10-CM

## 2022-06-30 DIAGNOSIS — R002 Palpitations: Secondary | ICD-10-CM

## 2022-06-30 DIAGNOSIS — C099 Malignant neoplasm of tonsil, unspecified: Secondary | ICD-10-CM | POA: Insufficient documentation

## 2022-06-30 LAB — TROPONIN T 3 HR W/ DELTA HIGH SENSITIVITY (IP/ED ONLY)
TROP T 0-3 HR DELTA High Sensitivity: 0 (ref 0–4)
TROP T 3 HR High Sensitivity: 7 ng/L (ref 0–14)

## 2022-06-30 LAB — CBC AND DIFFERENTIAL
Baso # K/uL: 0 10*3/uL (ref 0.0–0.2)
Basophil %: 0.6 %
Eos # K/uL: 0.1 10*3/uL (ref 0.0–0.5)
Eosinophil %: 1.1 %
Hematocrit: 46 % (ref 37–52)
Hemoglobin: 16.5 g/dL (ref 12.0–17.0)
IMM Granulocytes #: 0 10*3/uL (ref 0.0–0.0)
IMM Granulocytes: 0.2 %
Lymph # K/uL: 1.2 10*3/uL (ref 1.0–5.0)
Lymphocyte %: 18.6 %
MCH: 31 pg (ref 26–32)
MCHC: 36 g/dL (ref 32–37)
MCV: 87 fL (ref 75–100)
Mono # K/uL: 0.6 10*3/uL (ref 0.1–1.0)
Monocyte %: 9.7 %
Neut # K/uL: 4.6 10*3/uL (ref 1.5–6.5)
Nucl RBC # K/uL: 0 10*3/uL (ref 0.0–0.0)
Nucl RBC %: 0 /100 WBC (ref 0.0–0.2)
Platelets: 256 10*3/uL (ref 150–450)
RBC: 5.3 MIL/uL (ref 4.0–6.0)
RDW: 11.9 % (ref 0.0–15.0)
Seg Neut %: 69.8 %
WBC: 6.5 10*3/uL (ref 3.5–11.0)

## 2022-06-30 LAB — BASIC METABOLIC PANEL
Anion Gap: 13 (ref 7–16)
CO2: 27 mmol/L (ref 20–28)
Calcium: 10.2 mg/dL (ref 8.6–10.2)
Chloride: 100 mmol/L (ref 96–108)
Creatinine: 1.06 mg/dL (ref 0.67–1.17)
Glucose: 110 mg/dL — ABNORMAL HIGH (ref 60–99)
Lab: 11 mg/dL (ref 6–20)
Potassium: 3.7 mmol/L (ref 3.3–4.6)
Sodium: 140 mmol/L (ref 133–145)
eGFR BY CREAT: 86 *

## 2022-06-30 LAB — TROPONIN T 0 HR HIGH SENSITIVITY (IP/ED ONLY): TROP T 0 HR High Sensitivity: 7 ng/L (ref 0–11)

## 2022-06-30 LAB — EKG 12-LEAD
P: 45 deg
PR: 202 ms
QRS: 73 deg
QRSD: 100 ms
QT: 327 ms
QTc: 432 ms
Rate: 105 {beats}/min
T: 116 deg

## 2022-06-30 LAB — TROPONIN T 1 HR W/ DELTA HIGH SENSITIVITY
TROP T 0-1 HR DELTA High Sensitivity: 0 (ref 0–2)
TROP T 1 HR High Sensitivity: 7 ng/L (ref 0–11)

## 2022-06-30 LAB — TSH: TSH: 5.04 u[IU]/mL — ABNORMAL HIGH (ref 0.27–4.20)

## 2022-06-30 MED ORDER — POLYETHYLENE GLYCOL 3350 PO PACK 17 GM *I*
17.0000 g | PACK | Freq: Every day | ORAL | Status: DC | PRN
Start: 2022-06-30 — End: 2022-07-02

## 2022-06-30 MED ORDER — FENTANYL CITRATE 50 MCG/ML IJ SOLN *WRAPPED*
75.0000 ug | Freq: Once | INTRAMUSCULAR | Status: DC
Start: 2022-06-30 — End: 2022-06-30

## 2022-06-30 MED ORDER — HYDROCODONE-ACETAMINOPHEN 5-325 MG PO TABS *I*
1.0000 | ORAL_TABLET | Freq: Four times a day (QID) | ORAL | Status: DC | PRN
Start: 2022-06-30 — End: 2022-07-02

## 2022-06-30 MED ORDER — AMLODIPINE BESYLATE 5 MG PO TABS *I*
5.0000 mg | ORAL_TABLET | Freq: Every day | ORAL | Status: DC
Start: 2022-06-30 — End: 2022-07-02
  Administered 2022-07-01 – 2022-07-02 (×2): 5 mg via ORAL
  Filled 2022-06-30 (×3): qty 1

## 2022-06-30 MED ORDER — IOHEXOL 350 MG/ML (OMNIPAQUE) IV SOLN 500ML BOTTLE *I*
1.0000 mL | Freq: Once | INTRAVENOUS | Status: AC
Start: 2022-06-30 — End: 2022-06-30
  Administered 2022-06-30: 70 mL via INTRAVENOUS

## 2022-06-30 MED ORDER — ACETAMINOPHEN 650 MG RE SUPP *I*
650.0000 mg | Freq: Four times a day (QID) | RECTAL | Status: DC | PRN
Start: 2022-06-30 — End: 2022-06-30

## 2022-06-30 MED ORDER — CYCLOBENZAPRINE HCL 10 MG PO TABS *I*
5.0000 mg | ORAL_TABLET | Freq: Three times a day (TID) | ORAL | Status: DC | PRN
Start: 2022-06-30 — End: 2022-07-02

## 2022-06-30 MED ORDER — ACETAMINOPHEN 500 MG PO TABS *I*
1000.0000 mg | ORAL_TABLET | Freq: Once | ORAL | Status: AC
Start: 2022-06-30 — End: 2022-06-30
  Administered 2022-06-30: 1000 mg via ORAL
  Filled 2022-06-30: qty 2

## 2022-06-30 MED ORDER — GABAPENTIN 300 MG PO CAPSULE *I*
600.0000 mg | ORAL_CAPSULE | Freq: Three times a day (TID) | ORAL | Status: DC
Start: 2022-06-30 — End: 2022-07-02
  Filled 2022-06-30 (×2): qty 2

## 2022-06-30 MED ORDER — NICARDIPINE HCL IN NACL 0.2 MG/ML WRAPPED *I*
1.0000 mg/h | INTRAVENOUS | Status: DC
Start: 2022-06-30 — End: 2022-06-30
  Administered 2022-06-30: 2 mg/h via INTRAVENOUS
  Administered 2022-06-30: 4.5 mg/h via INTRAVENOUS
  Administered 2022-06-30: 2 mg/h via INTRAVENOUS
  Administered 2022-06-30: 1 mg/h via INTRAVENOUS
  Filled 2022-06-30: qty 200

## 2022-06-30 MED ORDER — ACETAMINOPHEN 325 MG PO TABS *I*
650.0000 mg | ORAL_TABLET | Freq: Four times a day (QID) | ORAL | Status: DC | PRN
Start: 2022-06-30 — End: 2022-06-30

## 2022-06-30 MED ORDER — LABETALOL HCL 20 MG/4 ML IV SOLN WRAPPED *I*
20.0000 mg | INTRAVENOUS | Status: DC | PRN
Start: 2022-06-30 — End: 2022-07-02

## 2022-06-30 MED ORDER — PANTOPRAZOLE SODIUM 40 MG PO TBEC *I*
40.0000 mg | DELAYED_RELEASE_TABLET | Freq: Every morning | ORAL | Status: DC
Start: 2022-07-01 — End: 2022-07-02
  Administered 2022-07-01 – 2022-07-02 (×2): 40 mg via ORAL
  Filled 2022-06-30 (×2): qty 1

## 2022-06-30 MED ORDER — ENOXAPARIN SODIUM 40 MG/0.4ML IJ SOSY *I*
40.0000 mg | PREFILLED_SYRINGE | Freq: Every day | INTRAMUSCULAR | Status: DC
Start: 2022-06-30 — End: 2022-07-02
  Filled 2022-06-30: qty 0.4

## 2022-06-30 NOTE — Telephone Encounter (Signed)
E patch results just added today.

## 2022-06-30 NOTE — Telephone Encounter (Signed)
Patient currently in ED.

## 2022-06-30 NOTE — Telephone Encounter (Signed)
Pt presents for BP check 210/128 manual, pt does complain of headaches, dizziness, chest pain. Just started on metoprolol 100mg within the week. Writer spoke to sharon smith NP and was advised to direct pt to ED. Pt agreeable, ED called with report.

## 2022-06-30 NOTE — ED Triage Notes (Signed)
Pt to ED after being sent from primary for high blood pressure, chest pain, and headache that has been ongoing for over a week.

## 2022-06-30 NOTE — Progress Notes (Signed)
Current use for oxygen/CPAP/Bipap: room air    Patient home use for oxygen/Bipap/ CPAP: room air    Patient takes the following respiratory medications at home: none    RT will continue to wean oxygen and follow patient throughout patient visit.

## 2022-06-30 NOTE — Telephone Encounter (Signed)
Patient came in to get scheduled fro BP Check.   Future Encounters      07/11/2022 10:45 AM Sch    FOLLOW UP VISIT    Ammie Dalton, MD

## 2022-06-30 NOTE — ED Provider Progress Notes (Incomplete Revision)
Hx of refractory HTN  On cardene low dose  Symptoms resolves after BP control  CTH rt SAH?  CTA head neg, NSGY signed off    [ ]  Admit to hospital medicine     Wolfgang Phoenix, MD  06/30/22 [phone]

## 2022-06-30 NOTE — ED Provider Progress Notes (Addendum)
Hx of refractory HTN  On cardene low dose  Symptoms resolves after BP control  CTH rt SAH?  CTA head neg, NSGY signed off    [ ]  Admit to hospital medicine     Wolfgang Phoenix, MD  06/30/22 2058    Addendum: Patient admitted to hospital medicine for further monitoring/management.  Appreciate their help in the care of this patient.  Serial vital signs otherwise reassuring.     Wolfgang Phoenix, MD  07/01/22 [phone]

## 2022-06-30 NOTE — Progress Notes (Signed)
Pt presents for BP check 210/128 manual, pt does complain of headaches, dizziness, chest pain. Just started on metoprolol 100mg  within the week. Writer spoke to [employer] NP and was advised to direct pt to ED. Pt agreeable, ED called with report.

## 2022-07-01 ENCOUNTER — Encounter: Payer: Self-pay | Admitting: Internal Medicine

## 2022-07-01 DIAGNOSIS — C099 Malignant neoplasm of tonsil, unspecified: Secondary | ICD-10-CM

## 2022-07-01 DIAGNOSIS — R5382 Chronic fatigue, unspecified: Secondary | ICD-10-CM

## 2022-07-01 DIAGNOSIS — I341 Nonrheumatic mitral (valve) prolapse: Secondary | ICD-10-CM

## 2022-07-01 DIAGNOSIS — G629 Polyneuropathy, unspecified: Secondary | ICD-10-CM

## 2022-07-01 DIAGNOSIS — F32A Depression, unspecified: Secondary | ICD-10-CM

## 2022-07-01 LAB — CBC
Hematocrit: 44 % (ref 37–52)
Hemoglobin: 15.5 g/dL (ref 12.0–17.0)
MCH: 30 pg (ref 26–32)
MCHC: 35 g/dL (ref 32–37)
MCV: 87 fL (ref 75–100)
Platelets: 233 10*3/uL (ref 150–450)
RBC: 5.1 MIL/uL (ref 4.0–6.0)
RDW: 12 % (ref 0.0–15.0)
WBC: 5.7 10*3/uL (ref 3.5–11.0)

## 2022-07-01 LAB — BASIC METABOLIC PANEL
Anion Gap: 10 (ref 7–16)
CO2: 27 mmol/L (ref 20–28)
Calcium: 9.7 mg/dL (ref 8.6–10.2)
Chloride: 102 mmol/L (ref 96–108)
Creatinine: 1.14 mg/dL (ref 0.67–1.17)
Glucose: 108 mg/dL — ABNORMAL HIGH (ref 60–99)
Lab: 14 mg/dL (ref 6–20)
Potassium: 4.2 mmol/L (ref 3.3–4.6)
Sodium: 139 mmol/L (ref 133–145)
eGFR BY CREAT: 79 *

## 2022-07-01 LAB — MAGNESIUM: Magnesium: 2.2 mg/dL (ref 1.6–2.5)

## 2022-07-01 LAB — PHOSPHORUS: Phosphorus: 4 mg/dL (ref 2.7–4.5)

## 2022-07-01 MED ORDER — METOPROLOL SUCCINATE 25 MG PO TB24 *I*
50.0000 mg | ORAL_TABLET | Freq: Every day | ORAL | Status: DC
Start: 2022-07-01 — End: 2022-07-02
  Administered 2022-07-01 – 2022-07-02 (×2): 50 mg via ORAL
  Filled 2022-07-01 (×2): qty 2

## 2022-07-01 NOTE — ED Provider Notes (Signed)
History     Chief Complaint   Patient presents with    Chest Pain    Headache    Hypertension     This is a 46 year old gentleman with history of hypertension here in the emergency department with hypertension, occipital headache and blurry vision.  Patient reports new hypertension diagnosis over the last year or so, has had medications increased a couple of times over the last several months, now taking metoprolol 100 mg.  Takes no other medications, reports good compliance with this medication, remains very hypertensive and was sent to the emergency department by his primary care office for evaluation.    He tells me over the last week or so he has had some chest discomfort, none currently, some headache which is primarily occipital, and notes some blurry vision.    Following a history including neck dissection he does have some abnormal facial muscle movements at baseline but none of this is changed.  No new unilateral weakness or numbness, no ataxia dysphagia or dysphonia.  No diplopia.          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    History of cholecystectomy Z90.49    MVP (mitral valve prolapse) I34.1    Depression F32.A    Hypertensive urgency I16.0    Neuropathy G62.9    Chronic fatigue R53.82            Past Surgical History:   Procedure Laterality Date    ELBOW FRACTURE SURGERY      GALLBLADDER SURGERY  2019    HERNIA REPAIR  2019    umbilical    NECK SURGERY Left 2018    radical dissection    TONSILLECTOMY  2017    SCC Left tonsil          Social History     Tobacco Use    Smoking status: Never    Smokeless tobacco: Never   Substance Use Topics    Alcohol use: Yes     Alcohol/week: 1.0 standard drink of alcohol     Types: 1 Glasses of wine per week    Drug use: Not Currently             Review of Systems    Physical Exam      Triage Vitals  Triage Start: Start, (06/30/22 1401)  First Recorded BP: (!) 199/138, Resp: 16, Temp: 36.3 C (97.3 F), Temp src: TEMPORAL Oxygen Therapy SpO2: 98 %, Oximetry Source: Lt Hand, O2 Device: None (Room air), Heart Rate: (!) 124, (06/30/22 1402) Heart Rate (via Pulse Ox): (!) 124, (06/30/22 1402).  First Pain Reported  0-10 Scale: 4, Pain Location/Orientation: Head (Headache), Pain Descriptors: Aching;Burning;Throbbing, (06/30/22 1402)       Physical Exam  Constitutional:       General: He is not in acute distress.     Appearance: Normal appearance.   HENT:      Head: Normocephalic and atraumatic.      Nose: Nose normal. No rhinorrhea.      Mouth/Throat:      Mouth: Mucous membranes are moist.      Pharynx: Oropharynx is clear.   Eyes:      Extraocular Movements: Extraocular movements intact.  Pupils: Pupils are equal, round, and reactive to light.   Cardiovascular:      Rate and Rhythm: Normal rate and regular rhythm.      Pulses: Normal pulses.      Heart sounds: No murmur heard.  Pulmonary:      Effort: Pulmonary effort is normal. No respiratory distress.      Breath sounds: Normal breath sounds.   Abdominal:      General: There is no distension.      Tenderness: There is no abdominal tenderness.   Musculoskeletal:         General: No deformity or signs of injury.      Cervical back: Normal range of motion and neck supple.   Skin:     General: Skin is warm and dry.   Neurological:      Mental Status: He is alert and oriented to person, place, and time.       Expanded Neurologic Exam    Cranial Nerves:   II: PERRL, visual field exam: Full  III/IV/VI: EOM intact. No nystagmus. Eyelids open equal.  V: Facial sensation symmetric to light touch   VII: The patient's face is asymmetric at rest, he reports this is baseline, he has full activation of all facial muscles which is symmetric.  VIII: Hearing intact bilaterally  IX/X: Normal swallowing, normal voice  XI: Equal shoulder shrug  XII: Tongue  midline    AOx3  Speech: Normal  Strength intact x 4  Sensation intact x 4  Pronator drift was absent  Coordination: Finger to nose intact   Gait: Normal  Truncal Stability: able to sit upright         Medical Decision Making     Assessment:  46 year old gentleman here with headache vision changes and hypertension    Differential diagnosis:  Concern for press, no evidence of hypertensive chest pain currently though reasonable to assess for cardiac effects, assess renal function, no lateralizing stroke symptoms.  Recent evaluation of thyroid studies makes hyperthyroidism unlikely.  Consideration for renal artery stenosis or pheochromocytoma given his young age, rapid progression, tachycardia and hypertension together on presentation    Plan:  CT imaging to exclude intracranial hemorrhage, large obvious stroke, mass lesion.  Lab evaluation.  IV antihypertensives.    ED Course and Disposition:  CT imaging initially concerning for very small subarachnoid hemorrhage, IV antihypertensive started, IV analgesia offered though his pain is quite mild at this time, had a bit elevated, repeat neurologic exam is without new abnormalities.  Discussed with neurosurgery who feel that subarachnoid hemorrhage is less likely given the imaging, appropriate for CT angiogram and reevaluation.    CT angiogram is reassuring, neurosurgery comfortable with hospitalization here to manage his blood pressure, discussed with the covering hospitalist and accepted to their service for ongoing evaluation and treatment.  Note that patient's symptoms are improved with his much improved blood pressure, he is requiring only very low-dose nicardipine at the time of my care transfer.                Clearance Coots, MD            Clearance Coots, MD  07/01/22 [phone]

## 2022-07-01 NOTE — H&P (Signed)
Patient Name: James Oneill Date of Admission: 06/30/2022   DOB: [birth year] Primary Care Physician: Eustace Quail, MD   Patient MR#: [id number]  Attending Physician: Alric Ran Jos*      [facility] Internal Medicine History and Physical Note     Chief Complaint     Headache, high blood pressure    History of Presenting Illness     James Oneill is a 46 y.o. male with past medical history significant for hypertension, depression, mitral valve prolapse, tonsillar cancer s/p radical neck dissection (2018), and cholecystectomy who presents to [facility] from PCP office with headache, chest pain, and hypertension.  Had presented to PCP office for BP follow up following recent medication change, where BP noted to be 210/128; pt also reported headache and chest pain.    He reports issues managing hypertension for several months now and had recently started metoprolol/HCTZ combination. He states he consistently takes his BP medication. He reports he will occasionally have headaches and dizziness, but denies syncope or loss of consciousness.  Denies nausea, vomiting, diarrhea, dyspnea, abdominal pain, or seizure.  When asked about chest pain, reports it came on suddenly; able to localize site of pain with fingertip.  Also endorses intermittent episodes of palpitations which he recently had e-patch for.    He also reports some blurry vision over past few weeks, but denies eye pain or photophobia.  No recent eye exam.    ED course: In ED, initial BP 199/138, HR 93, SpO2 98% on room air, RR 24.  CT head with findings concerning for possible subarachnoid bleed but follow up CTA negative.  Pt started on nicardipine infusion with improvement in BP and headache.  Labs notable for normal and adynamic troponin levels, normal renal function, normal hct/hgb, and normal WBC.  TSH mildly elevated at 5.04. Chest x-ray without acute cardiopulmonary disease, but did show age indeterminate compression fracture of  lumbar vertebral body.    14 Point Review of Systems   Review of Systems   Constitutional:  Negative for chills, fever and weight loss.   HENT:  Negative for congestion, hearing loss and nosebleeds.    Eyes:  Positive for blurred vision. Negative for double vision, photophobia and pain.   Respiratory:  Negative for cough and shortness of breath.    Cardiovascular:  Positive for chest pain. Negative for palpitations.   Gastrointestinal:  Negative for abdominal pain, nausea and vomiting.   Genitourinary:  Negative for dysuria.   Musculoskeletal: Negative.    Neurological:  Positive for headaches. Negative for sensory change, focal weakness, seizures, loss of consciousness and weakness.   Psychiatric/Behavioral:  Negative for depression and substance abuse. The patient is not nervous/anxious.          Past Medical History Past Surgical History   Past Medical History:   Diagnosis Date    Cancer     Depression     MVP (mitral valve prolapse)     Past Surgical History:   Procedure Laterality Date    ELBOW FRACTURE SURGERY      GALLBLADDER SURGERY  2019    HERNIA REPAIR  2019    umbilical    NECK SURGERY Left 2018    radical dissection    TONSILLECTOMY  2017    SCC Left tonsil      Social History Family History   Social History     Socioeconomic History    Marital status: Divorced   Tobacco Use    Smoking  status: Never    Smokeless tobacco: Never   Substance and Sexual Activity    Alcohol use: Yes     Alcohol/week: 1.0 standard drink of alcohol     Types: 1 Glasses of wine per week    Drug use: Not Currently    Sexual activity: Not Currently      Family History   Problem Relation Age of Onset    Cancer Father     Dementia Maternal Grandmother         Allergies     Allergies   Allergen Reactions    Shellfish-Derived Products Hives     Medications     Hospital administered medications:  Scheduled Meds:   amLODIPine  5 mg Oral Daily    pantoprazole  40 mg Oral QAM    gabapentin  600 mg Oral TID    enoxaparin  40 mg  Subcutaneous Daily @ 2100     PRN Meds:   labetalol  20 mg Intravenous Q4H PRN    cyclobenzaprine  5 mg Oral TID PRN    HYDROcodone-acetaminophen  1 tablet Oral Q6H PRN    polyethylene glycol  17 g Oral Daily PRN     Continuous Infusions:    Home Medications:  Prior to Admission medications    Medication Sig Start Date End Date Taking? Authorizing Provider   metoprolol-hydrochlorothiazide (LOPRESSOR HCT) 100-25 MG per tablet Take 1 tablet by mouth every morning 06/23/22  Yes Betker, Kimberley A, NP   methylphenidate (RITALIN) 10 mg tablet Take 1 tablet (10 mg total) by mouth 2 times daily (before meals)  Max daily dose: 20 mg 06/13/22  Yes Eustace Quail, MD   cyclobenzaprine (FLEXERIL) 5 mg tablet Take 1 tablet (5 mg total) by mouth 3 times daily as needed for Muscle spasms 06/13/22  Yes Eustace Quail, MD   HYDROcodone-acetaminophen [facility]) 5-325 mg per tablet Take 1 tablet by mouth every 6 hours as needed for Pain  Max daily dose: 4 tablets 06/13/22  Yes Eustace Quail, MD   esomeprazole magnesium (NEXIUM) 40 mg capsule Take 1 capsule (40 mg total) by mouth daily (before breakfast) 06/13/22  Yes Eustace Quail, MD   gabapentin 600 mg tablet Take 1 tablet (600 mg total) by mouth 3 times daily 06/13/22  Yes Eustace Quail, MD   fluticasone [facility]) 50 MCG/ACT nasal spray Spray 1 spray into nostril daily 06/02/21  Yes Nimmagadda, Janina Mayo, MD   polyethylene glycol (GLYCOLAX) 17 gm/scoop powder Take 17 g by mouth daily  Mix in 8 oz water or juice and drink. 10/11/20  Yes Eustace Quail, MD   mupirocin Idelle Jo) 2 % ointment Apply topically 3 times daily  for skin inf to the following areas: rash on face 06/27/22   Leighton Roach, MD   doxycycline hyclate (VIBRAMYCIN) 100 mg capsule Take 1 capsule (100 mg total) by mouth every 12 hours for 14 days  for Infection Under the Skin 06/27/22 07/11/22  Leighton Roach, MD   cholestyramine Lanetta Inch) 4 g packet Take 4 g by mouth 2 times daily (with meals) 02/08/22   Eustace Quail,  MD   mupirocin (BACTROBAN) 2 % ointment Apply topically 3 times daily  for nasal vestibulitis to the following a2reas: nasal vestibule 01/11/21   Leighton Roach, MD      Physical Exam     Vital Signs:  Vitals:    06/30/22 2120 06/30/22 2130 06/30/22 2140 06/30/22 2150   BP: 109/72 111/68 108/72 107/72   BP Location:  Pulse: 79 75 76 75   Resp: 17 10 19 11    Temp:       TempSrc:       SpO2: 93% 94% 92% 92%   Weight:       Height:         Oxygen Requirement:        Weight:  Wt Readings from Last 3 Encounters:   06/30/22 77.1 kg (170 lb)   06/27/22 80.2 kg (176 lb 14.4 oz)   06/13/22 79.9 kg (176 lb 1.6 oz)     Physical Exam  Constitutional:       General: He is not in acute distress.     Appearance: Normal appearance. He is well-developed.   HENT:      Head: Normocephalic and atraumatic.   Eyes:      Pupils: Pupils are equal, round, and reactive to light.   Cardiovascular:      Rate and Rhythm: Normal rate and regular rhythm.      Heart sounds: Normal heart sounds.   Pulmonary:      Effort: Pulmonary effort is normal.      Breath sounds: Normal breath sounds.   Abdominal:      General: Bowel sounds are normal. There is no distension.      Palpations: Abdomen is soft.      Tenderness: There is abdominal tenderness in the left lower quadrant.   Musculoskeletal:      Right lower leg: No edema.      Left lower leg: No edema.   Skin:     General: Skin is warm and dry.   Neurological:      General: No focal deficit present.      Mental Status: He is alert and oriented to person, place, and time. Mental status is at baseline.      Cranial Nerves: No dysarthria or facial asymmetry.      Motor: Motor function is intact.   Psychiatric:         Mood and Affect: Mood and affect normal.         Speech: Speech normal.       Labs     CBC BMP   Recent Labs   Lab 06/30/22  1430   WBC 6.5   Hemoglobin 16.5   Hematocrit 46   Platelets 256        Recent Labs   Lab 06/30/22  1431   Sodium 140   Potassium 3.7   Chloride 100   CO2 27    UN 11   Creatinine 1.06   Glucose 110*   Calcium 10.2        Coags LFTs   No results for input(s): APTT, INR, PTT, PTI in the last 168 hours.     No results for input(s): ALK, TB, HTBIL, DB, ALB, ALB1, ALT, AST, FLTP, TP in the last 168 hours.     Cardiac Enzymes Other   No results for input(s): CKTS, TROPU, TROP, MCKMB, CKMB in the last 168 hours.    No components found with this basename: RICKMBS   ABG: No results for input(s): APH, APCO2, APO2, AOSAT, ABE, LAC, CO in the last 168 hours.       Microbiology results           Radiology Results   CT head without contrast    Result Date: 06/30/2022  Small focus of increased attenuation in the RIGHT frontal sulcus. This could represent small area of  subarachnoid hemorrhage versus mineralization. Similar finding may be present in the 2019 study. Consider further evaluation with MRI if there is continued clinical concern. END OF IMPRESSION       [facility] submits this DICOM format image data and final report to the [facility], an independent secure electronic health information exchange, on a reciprocally searchable basis (with patient authorization) for a minimum of 12 months after exam date.    EKG 12 lead (initial)    Result Date: 06/30/2022  Sinus tachycardia Borderline  prolonged PR interval Borderline  T abnormalities, inferior leads    CT angio head    Result Date: 06/30/2022  Normal study END OF IMPRESSION       [facility] submits this DICOM format image data and final report to the [facility], an independent secure electronic health information exchange, on a reciprocally searchable basis (with patient authorization) for a minimum of 12 months after exam date.    US carotid doppler bilateral    Result Date: 06/30/2022  Mild bilateral noncalcific plaque visualized No hemodynamically significant stenosis of the internal carotid arteries. Interpretation is based upon the 2021 IAC modified diagnostic criteria adopted from the 2003 Society of Radiologists in  Ultrasound Consensus Conference. END OF IMPRESSION       [facility] submits this DICOM format image data and final report to the [facility], an independent secure electronic health information exchange, on a reciprocally searchable basis (with patient authorization) for a minimum of 12 months after exam date.    *Chest standard frontal and lateral views    Result Date: 06/30/2022  Age-indeterminate compression deformity of a upper lumbar vertebral body. Correlate with site of tenderness. END OF IMPRESSION       [facility] submits this DICOM format image data and final report to the [facility], an independent secure electronic health information exchange, on a reciprocally searchable basis (with patient authorization) for a minimum of 12 months after exam date.   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 male with past medical history significant for hypertension, depression, neuropathy, mitral valve prolapse, chronic fatigue/malaise, tonsillar cancer s/p radical neck dissection (2018), and cholecystectomy who presents with uncontrolled hypertension.    Hypertensive urgency  -d/c nicardipine, can use PRN labetalol for SBP >180  -exam and CT reassuring against RPLS (no focal deficits, confusion, seizures)  -possible hypertensive retinopathy given recent onset blurry vision; would benefit from ey exam as outpatient  -likely this represents inadequate control with outpatient medications (on metoprolol-HCTZ 100/25 at home); may benefit from ACE/ARB or dihydropyridine CCB  -pt reports palpitations and recent E-patch did note one episode of SVT; monitor on telemetry    Chronic fatigue  -holding home ritalin in setting of HTN urgency    Depression  -not  currently on treatment; monitor    Left brachioplexus neuropathy  -home gabapentin 600 mg TID  -home flexeril and Norco PRN    Fluids & Electrolytes: N/A  Nutrition: Diet regular  Mobility Plan/Ability: Independent.   DVT prophylaxis: Lovenox 40 mg nightly    Code Status: Full  Code     Disposition: Admit patient as observation status. Anticipate 24-48 hour hospital stay.     Total care time > 75  minutes today, > 50% spent counseling and coordinating care.  Case discussed with Dr. Dennie Maizes, who is in agreement with assessment and plan.    Signed:   Geanie Logan, NP  07/01/2022  12:02 AM

## 2022-07-01 NOTE — Plan of Care (Signed)
Problem: Safety  Goal: Patient will remain free of falls  Outcome: Progressing towards goal     Problem: Pain/Comfort  Goal: Patient's pain or discomfort is manageable  Outcome: Progressing towards goal     Problem: Mobility  Goal: Patient's functional status is maintained or improved  Outcome: Progressing towards goal     Problem: Psychosocial  Goal: Demonstrates ability to cope with illness  Outcome: Progressing towards goal     Problem: Cognitive function  Goal: Cognitive function will be maintained or return to baseline    Outcome: Progressing towards goal  Goal: Vital signs will be within normal limits  Outcome: Progressing towards goal  Goal: Behaviors will return to baseline  Outcome: Progressing towards goal  Goal: Ambulation and mobility will be maintained  Outcome: Progressing towards goal     Problem: Risk for Impaired Sleep/Wake Cycle  Goal: The patient will maintain an adequate sleep/wake cycle  Outcome: Progressing towards goal

## 2022-07-01 NOTE — H&P (Incomplete)
Patient Name: James Oneill Date of Admission: 06/30/2022   DOB: Feb 13, 1976 Primary Care Physician: Eustace Quail, MD   Patient MR#: [id number]  Attending Physician: Alric Ran Jos*      [facility] History and Physical Note     Chief Complaint     Headache, high blood pressure    History of Presenting Illness     James Oneill is a 46 y.o. male with past medical history significant for hypertension, depression, mitral valve prolapse, tonsillar cancer s/p radical neck dissection (2018), and cholecystectomy who presents to [facility] from PCP office with headache, chest pain, and hypertension.  Had presented to PCP office for BP follow up following recent medication change, where BP noted to be 210/128;            ED course:       14 Point Review of Systems     Positive symptoms denoted in RED. Negative symptoms denoted in BLACK    Constitutional weight loss, weight gain, fever, chills, malaise   Eyes diplopia, blurred vision   Ear/Nose/Throat  hearing loss, ringing in ears, sinus congestion, loss of smell, sore throat, altered taste   Gastrointestinal nausea, vomiting, diarrhea, constipation, abdominal pain   Integumentary skin rash, mole, dryness, pigmentation   Endocrine polyuria, polydipsia, cold-intolerance, heat-intolerance   Genitourinary  hematuria, incontinence, dysuria   Hematologic/Lymphatic bruising, bleeding   Cardiovascular  chest pain, palpitations, PND, orthopnea   Respiratory shortness of breath, cough, wheezing    Musculoskeletal  joint pain, joint stiffness, joint swelling, myalgias   Neurological dizziness, syncope, seizure, vertigo, weakness, loss of sensation,   Psychiatric depression, anxiety, hallucination   Allergies/Immunologic  hives, swelling of lips, swelling of tongue      Past Medical History Past Surgical History   Past Medical History:   Diagnosis Date   . Cancer    . Depression    . MVP (mitral valve prolapse)     Past Surgical History:    Procedure Laterality Date   . ELBOW FRACTURE SURGERY     . GALLBLADDER SURGERY  2019   . HERNIA REPAIR  2019    umbilical   . NECK SURGERY Left 2018    radical dissection   . TONSILLECTOMY  2017    SCC Left tonsil      Social History Family History   Social History     Socioeconomic History   . Marital status: Divorced   Tobacco Use   . Smoking status: Never   . Smokeless tobacco: Never   Substance and Sexual Activity   . Alcohol use: Yes     Alcohol/week: 1.0 standard drink of alcohol     Types: 1 Glasses of wine per week   . Drug use: Not Currently   . Sexual activity: Not Currently      Family History   Problem Relation Age of Onset   . Cancer Father    . Dementia Maternal Grandmother         Allergies     Allergies   Allergen Reactions   . Shellfish-Derived Products Hives     Medications     Hospital administered medications:  Scheduled Meds:  . amLODIPine  5 mg Oral Daily   . pantoprazole  40 mg Oral QAM   . gabapentin  600 mg Oral TID   . enoxaparin  40 mg Subcutaneous Daily @ 2100     PRN Meds:  . labetalol  20 mg Intravenous  Q4H PRN   . cyclobenzaprine  5 mg Oral TID PRN   . HYDROcodone-acetaminophen  1 tablet Oral Q6H PRN   . polyethylene glycol  17 g Oral Daily PRN     Continuous Infusions:    Home Medications:  Prior to Admission medications    Medication Sig Start Date End Date Taking? Authorizing Provider   metoprolol-hydrochlorothiazide (LOPRESSOR HCT) 100-25 MG per tablet Take 1 tablet by mouth every morning 06/23/22  Yes Betker, Kimberley A, NP   methylphenidate (RITALIN) 10 mg tablet Take 1 tablet (10 mg total) by mouth 2 times daily (before meals)  Max daily dose: 20 mg 06/13/22  Yes Eustace Quail, MD   cyclobenzaprine (FLEXERIL) 5 mg tablet Take 1 tablet (5 mg total) by mouth 3 times daily as needed for Muscle spasms 06/13/22  Yes Eustace Quail, MD   HYDROcodone-acetaminophen [facility]) 5-325 mg per tablet Take 1 tablet by mouth every 6 hours as needed for Pain  Max daily dose: 4 tablets 06/13/22   Yes Eustace Quail, MD   esomeprazole magnesium (NEXIUM) 40 mg capsule Take 1 capsule (40 mg total) by mouth daily (before breakfast) 06/13/22  Yes Eustace Quail, MD   gabapentin 600 mg tablet Take 1 tablet (600 mg total) by mouth 3 times daily 06/13/22  Yes Eustace Quail, MD   fluticasone [facility] 50 MCG/ACT nasal spray Spray 1 spray into nostril daily 06/02/21  Yes Nimmagadda, Janina Mayo, MD   polyethylene glycol (GLYCOLAX) 17 gm/scoop powder Take 17 g by mouth daily  Mix in 8 oz water or juice and drink. 10/11/20  Yes Eustace Quail, MD   mupirocin Idelle Jo) 2 % ointment Apply topically 3 times daily  for skin inf to the following areas: rash on face 06/27/22   Leighton Roach, MD   doxycycline hyclate (VIBRAMYCIN) 100 mg capsule Take 1 capsule (100 mg total) by mouth every 12 hours for 14 Oneill  for Infection Under the Skin 06/27/22 07/11/22  Leighton Roach, MD   cholestyramine Lanetta Inch) 4 g packet Take 4 g by mouth 2 times daily (with meals) 02/08/22   Eustace Quail, MD   mupirocin (BACTROBAN) 2 % ointment Apply topically 3 times daily  for nasal vestibulitis to the following a2reas: nasal vestibule 01/11/21   Leighton Roach, MD      Physical Exam     Vital Signs:  Vitals:    06/30/22 2120 06/30/22 2130 06/30/22 2140 06/30/22 2150   BP: 109/72 111/68 108/72 107/72   BP Location:       Pulse: 79 75 76 75   Resp: 17 10 19 11    Temp:       TempSrc:       SpO2: 93% 94% 92% 92%   Weight:       Height:         Oxygen Requirement:        Weight:  Wt Readings from Last 3 Encounters:   06/30/22 77.1 kg (170 lb)   06/27/22 80.2 kg (176 lb 14.4 oz)   06/13/22 79.9 kg (176 lb 1.6 oz)     General: Not in acute distress. Conversant and able to make needs known.  HEENT: Normocephalic, atraumatic. PERRLA. EOMI. No lesions on nose or ears. MMM  NECK: Supple, trachea is in midline, no palpable lymphadenopathy.  CVS: S1 S2 heard, RRR, no appreciable murmurs/rubs/gallops  PULM: normal effort, CTAB, with no appreciable wheezes,  rales, or rhonchi  ABD: Bowel sounds active, soft, non tender, not distended. No guarding rigidity  or rebound tenderness.   EXT: No clubbing, cyanosis, or edema. Distal pulses 2+  NEURO: A and O to person, place, time and current events. Cranial nerves II-XII are grossly intact.  Sensations intact. Strength 5/5 in bilateral upper and lower extremities.   MSK: No joint swelling or tenderness. ROM intact throughout.   SKIN: Warm, dry and intact with no rashes or ulcers.   PSYCH: Appropriate mood and affect.    Foley: { yes no n/a:19876}    Labs     CBC BMP   Recent Labs   Lab 06/30/22  1430   WBC 6.5   Hemoglobin 16.5   Hematocrit 46   Platelets 256        Recent Labs   Lab 06/30/22  1431   Sodium 140   Potassium 3.7   Chloride 100   CO2 27   UN 11   Creatinine 1.06   Glucose 110*   Calcium 10.2        Coags LFTs   No results for input(s): APTT, INR, PTT, PTI in the last 168 hours.     No results for input(s): ALK, TB, HTBIL, DB, ALB, ALB1, ALT, AST, FLTP, TP in the last 168 hours.     Cardiac Enzymes Other   No results for input(s): CKTS, TROPU, TROP, MCKMB, CKMB in the last 168 hours.    No components found with this basename: RICKMBS   ABG: No results for input(s): APH, APCO2, APO2, AOSAT, ABE, LAC, CO in the last 168 hours.       Microbiology results           Radiology Results   CT head without contrast    Result Date: 06/30/2022  Small focus of increased attenuation in the RIGHT frontal sulcus. This could represent small area of subarachnoid hemorrhage versus mineralization. Similar finding may be present in the 2019 study. Consider further evaluation with MRI if there is continued clinical concern. END OF IMPRESSION       [facility] Imaging submits this DICOM format image data and final report to the [facility], an independent secure electronic health information exchange, on a reciprocally searchable basis (with patient authorization) for a minimum of 12 months after exam date.    EKG 12 lead (initial)    Result  Date: 06/30/2022  Sinus tachycardia Borderline  prolonged PR interval Borderline  T abnormalities, inferior leads    CT angio head    Result Date: 06/30/2022  Normal study END OF IMPRESSION       [facility] Imaging submits this DICOM format image data and final report to the [facility], an independent secure electronic health information exchange, on a reciprocally searchable basis (with patient authorization) for a minimum of 12 months after exam date.    US carotid doppler bilateral    Result Date: 06/30/2022  Mild bilateral noncalcific plaque visualized No hemodynamically significant stenosis of the internal carotid arteries. Interpretation is based upon the 2021 IAC modified diagnostic criteria adopted from the 2003 Society of Radiologists in Ultrasound Consensus Conference. END OF IMPRESSION       UR Imaging submits this DICOM format image data and final report to the [facility], an independent secure electronic health information exchange, on a reciprocally searchable basis (with patient authorization) for a minimum of 12 months after exam date.    *Chest standard frontal and lateral views    Result Date: 06/30/2022  Age-indeterminate compression deformity of a upper lumbar vertebral body. Correlate with site  of tenderness. END OF IMPRESSION       UR Imaging submits this DICOM format image data and final report to the [facility], an independent secure electronic health information exchange, on a reciprocally searchable basis (with patient authorization) for a minimum of 12 months after exam date.   Hospital Problems     Active Hospital Problems    Diagnosis    . Marland Kitchen*Hypertensive urgency    . MVP (mitral valve prolapse)    . Depression    . Tonsillar cancer      Assessment and Plan           Fluids & Electrolytes: ***  Nutrition: Diet regular  Mobility Plan/Ability: Independent. Up with assistance. Bedrest. PT/OT***  DVT prophylaxis: Lovenox, Heparin, SCDs, Therapeutically anticoagulated with warfarin/DOAC  ***    Code Status: Full Code     Disposition: Admit patient as inpatient. Anticipate greater than 2 midnight stay. Admit patient as observation status. Anticipate 24-48 hour hospital stay. ***    Total care time > 35/55/75*** minutes today, > 50% spent counseling and coordinating care.  Case discussed with Dr. Marland Kitchen Patient evaluated by Dr. ***, who is in agreement with assessment and plan.    Signed:   Geanie Logan, NP  07/01/2022  12:02 AM

## 2022-07-01 NOTE — Continuity of Care (Signed)
Patient is currently up and showering. Provided with paper scrubs and fresh bedding.

## 2022-07-01 NOTE — Progress Notes (Signed)
Patient Name: James Oneill Date of Admission: 06/30/2022   DOB: 07-Jun-1976 Primary Care Physician: Eustace Quail, MD   Patient MR#: [id number]  Attending Physician: Alric Ran Jos*      [facility] Progress Note     Interval history     Date of service: 07/01/22   LOS: 0 days     James Oneill is a 46 y.o. male with past medical history significant for hypertension, depression, mitral valve prolapse, tonsillar cancer s/p radical neck dissection (2018), and cholecystectomy who presents to [facility] from [facility]CP office with headache, chest pain, and hypertension.  Had presented to PCP office for BP follow up following recent medication change, where BP noted to be 210/128; pt also reported headache and chest pain.     He reports issues managing hypertension for several months now and had recently started metoprolol/HCTZ combination. He states he consistently takes his BP medication. He reports he will occasionally have headaches and dizziness, but denies syncope or loss of consciousness. He also reports some blurry vision over past few weeks, but denies eye pain or photophobia.  No recent eye exam.     ED course: In ED, initial BP 199/138, HR 93, SpO2 98% on room air, RR 24.  CT head with findings concerning for possible subarachnoid bleed but follow up CTA negative.  Pt started on nicardipine infusion with improvement in BP and headache.  Labs notable for normal and adynamic troponin levels, normal renal function, normal hct/hgb, and normal WBC.  TSH mildly elevated at 5.04.    Admitted to Med surg floor for hypertensive urgency.         Subjective     Patient seen and examined at bedside.     Patient had tingling down his face and headache earlier this morning which has since resolved. He feels better now, eating and drinking well.     he otherwise denies fever, chills,  chest pain, SOB, abdominal pain, nausea, vomiting, diarrhea, constipation, or weakness.      Objective      Vitals:  Vitals:    07/01/22 1019 07/01/22 1049 07/01/22 1119 07/01/22 1149   BP: 126/80 126/83 130/80 (!) 138/92   BP Location:       Pulse: 79 76 77 82   Resp: 18 15 14 18    Temp:       TempSrc:       SpO2:       Weight:       Height:         Oxygen Requirement:        Ins/Outs:   No intake or output data in the 24 hours ending 07/01/22 1610  Weight:  Wt Readings from Last 3 Encounters:   06/30/22 77.1 kg (170 lb)   06/27/22 80.2 kg (176 lb 14.4 oz)   06/13/22 79.9 kg (176 lb 1.6 oz)     Physical Exam by Systems:  General: Not in acute distress. Conversant and able to make needs known.  HEENT: Normocephalic, atraumatic. PERRLA. EOMI. No lesions on nose or ears. MMM  NECK: Supple, trachea is in midline, no palpable lymphadenopathy.  CVS: S1 S2 heard, RRR, no appreciable murmurs/rubs/gallops  PULM: normal effort, CTAB, with no appreciable wheezes, rales, or rhonchi  ABD: Bowel sounds active, soft, non tender, not distended. No guarding rigidity or rebound tenderness.   EXT: No clubbing, cyanosis, or edema. Distal pulses 2+  NEURO: A and O to person, place, time and  current events. Cranial nerves II-XII are grossly intact.  Sensations intact. Strength 5/5 in bilateral upper and lower extremities.  MSK: No joint swelling or tenderness. ROM intact throughout.    SKIN: Warm, dry and intact with no rashes or ulcers.   PSYCH: Appropriate mood and affect.    Foley: no    Active Hospital Medications     Scheduled Meds    metoprolol succinate ER  50 mg Oral Daily    amLODIPine  5 mg Oral Daily    pantoprazole  40 mg Oral QAM    gabapentin  600 mg Oral TID    enoxaparin  40 mg Subcutaneous Daily @ 2100     PRN Meds   labetalol  20 mg Intravenous Q4H PRN    cyclobenzaprine  5 mg Oral TID PRN    HYDROcodone-acetaminophen  1 tablet Oral Q6H PRN    polyethylene glycol  17 g Oral Daily PRN     Continuous Infusions      Labs     CBC BMP   Recent Labs   Lab 07/01/22  0538 06/30/22  1430   WBC 5.7 6.5   Hemoglobin 15.5 16.5    Hematocrit 44 46   Platelets 233 256        Recent Labs   Lab 07/01/22  0538 06/30/22  1431   Sodium 139 140   Potassium 4.2 3.7   Chloride 102 100   CO2 27 27   UN 14 11   Creatinine 1.14 1.06   Glucose 108* 110*   Calcium 9.7 10.2   Phosphorus 4.0  --         Coags LFTs   No results for input(s): APTT, INR, PTT, PTI in the last 168 hours.     No results for input(s): ALK, TB, HTBIL, DB, ALB, ALB1, ALT, AST, FLTP, TP in the last 168 hours.     Cardiac Enzymes Other   No results for input(s): CKTS, TROPU, TROP, MCKMB, CKMB in the last 168 hours.    No components found with this basename: RICKMBS   ABG: No results for input(s): APH, APCO2, APO2, AOSAT, ABE, LAC, CO in the last 168 hours.     Microbiology results           Radiology Results   CT head without contrast    Result Date: 06/30/2022  Small focus of increased attenuation in the RIGHT frontal sulcus. This could represent small area of subarachnoid hemorrhage versus mineralization. Similar finding may be present in the 2019 study. Consider further evaluation with MRI if there is continued clinical concern. END OF IMPRESSION       UR Imaging submits this DICOM format image data and final report to the [facility], an independent secure electronic health information exchange, on a reciprocally searchable basis (with patient authorization) for a minimum of 12 months after exam date.    EKG 12 lead (initial)    Result Date: 06/30/2022  Sinus tachycardia Borderline  prolonged PR interval Borderline  T abnormalities, inferior leads    CT angio head    Result Date: 06/30/2022  Normal study END OF IMPRESSION       UR Imaging submits this DICOM format image data and final report to the [facility], an independent secure electronic health information exchange, on a reciprocally searchable basis (with patient authorization) for a minimum of 12 months after exam date.    US carotid doppler bilateral    Result Date: 06/30/2022  Mild bilateral noncalcific plaque  visualized No hemodynamically significant stenosis of the internal carotid arteries. Interpretation is based upon the 2021 IAC modified diagnostic criteria adopted from the 2003 Society of Radiologists in Ultrasound Consensus Conference. END OF IMPRESSION       UR Imaging submits this DICOM format image data and final report to the [facility], an independent secure electronic health information exchange, on a reciprocally searchable basis (with patient authorization) for a minimum of 12 months after exam date.    *Chest standard frontal and lateral views    Result Date: 06/30/2022  Age-indeterminate compression deformity of a upper lumbar vertebral body. Correlate with site of tenderness. END OF IMPRESSION       UR Imaging submits this DICOM format image data and final report to the [facility], an independent secure electronic health information exchange, on a reciprocally searchable basis (with patient authorization) for a minimum of 12 months after exam date.   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 male with past medical history significant for hypertension, depression, neuropathy, mitral valve prolapse, chronic fatigue/malaise, tonsillar cancer s/p radical neck dissection (2018), and cholecystectomy who presents with uncontrolled hypertension.     Hypertensive urgency  -d/c nicardipine, can use PRN labetalol for SBP >180  -possible hypertensive retinopathy given recent onset blurry vision; would benefit from ey exam as outpatient  -pt reports palpitations and recent E-patch did note one episode of SVT; monitor on telemetry  -patient transitioned to metoprolol succinate 50 mg daily and amlodipine 5 mg daily on  07/01/22 with good control of blood pressure thus far, continue to monitor overnight      Chronic fatigue  -holding home ritalin in setting of HTN urgency     Depression  -not currently on treatment; monitor     Left brachioplexus neuropathy  -home gabapentin 600 mg TID  -home flexeril and Norco PRN     Fluids & Electrolytes: N/A  Nutrition: Diet regular  Mobility Plan/Ability: Independent.   DVT prophylaxis: Lovenox 40 mg nightly     Code Status: Full Code   DISPOSITION: Continue to monitor for overall clinical improvement.    Patient discussed with Dr. Dennie Maizes who is in agreement with this plan.    Total care time > 25 minutes today, > 50% spent counseling and coordinating care. Case discussed with APP, RN, case management, pharmacy.    Signed:   Leana Gamer, PA   07/01/2022  4:10 PM

## 2022-07-02 ENCOUNTER — Other Ambulatory Visit: Payer: Self-pay | Admitting: Internal Medicine

## 2022-07-02 DIAGNOSIS — I10 Essential (primary) hypertension: Secondary | ICD-10-CM | POA: Diagnosis present

## 2022-07-02 DIAGNOSIS — N529 Male erectile dysfunction, unspecified: Secondary | ICD-10-CM

## 2022-07-02 LAB — CORTISOL: CORTISOL,AM: 12.5 ug/dL (ref 6.0–18.4)

## 2022-07-02 MED ORDER — METOPROLOL SUCCINATE 50 MG PO TB24 *I*
50.0000 mg | ORAL_TABLET | Freq: Every day | ORAL | 0 refills | Status: DC
Start: 2022-07-03 — End: 2022-08-18

## 2022-07-02 MED ORDER — AMLODIPINE BESYLATE 5 MG PO TABS *I*
5.0000 mg | ORAL_TABLET | Freq: Every day | ORAL | 0 refills | Status: DC
Start: 2022-07-03 — End: 2022-08-18

## 2022-07-02 NOTE — Discharge Instructions (Addendum)
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  Your Ritalin was stopped as this can worsen hypertension, and should be generally avoided with patients without a history of structural heart disease.  Your blood pressure medications were changed and your blood pressure was well controlled with metoprolol succinate 50 mg daily and amlodipine 5 mg daily.  Serum a.m. cortisol was also checked to evaluate for other possible etiologies of hypertension.  Follow-up with PCP for results as they are still pending..  You have been instructed to maintain a blood pressure log and bring this with you to your follow-up PCP appointment.  Outpatient echocardiogram was also ordered to evaluate mitral valve prolapse.  Additionally, you raise concerns about erectile dysfunction and therefore outpatient referral was sent to urology for further evaluation.  You have been discharged home in stable condition.    Education: see attached    Take your medications as prescribed.    Follow up with your PCP within 7-10 days of discharge.  Notify your PCP for further medication refills.  Please sign up with [employer] app to have access to your medical information.    Discharge Activity:  Activity as tolerated    Discharge Diet:  Regular Diet    Complications:  Call your PCP's office if you are experiencing any of the following: chest pain, shortness of breath, acute abdominal pain, no BM>4 days, unable to void, fever/chills, any prolonged bleeding if on blood thinner medication

## 2022-07-02 NOTE — Progress Notes (Signed)
All discharge and follow up instructions given, patient verbalized understanding and left without concerns or complaints.

## 2022-07-02 NOTE — ED Obs Notes (Signed)
Received report from Ashley Kuc RN.

## 2022-07-02 NOTE — Discharge Summary (Signed)
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 male with past medical history significant for hypertension, depression, mitral valve prolapse, tonsillar cancer s/p radical neck dissection (2018), and cholecystectomy who presents to [facility] from PCP office with headache, chest pain, and hypertension.  Had presented to PCP office for BP follow up following recent medication change, where BP noted to be 210/128; pt also reported headache and chest pain.     He reports issues managing hypertension for several months now and had recently started metoprolol/HCTZ combination. He states he consistently takes his BP medication. He reports he will occasionally have headaches and dizziness, but denies syncope or loss of consciousness. He also reports some blurry vision over past few weeks, but denies eye pain or photophobia.  No recent eye exam.     In ED, initial BP 199/138, HR 93, SpO2 98% on room air, RR 24.  CT head with findings concerning for possible subarachnoid bleed but follow up CTA negative.  Pt started on nicardipine infusion with improvement in BP and headache.  Labs notable for normal and adynamic troponin levels, normal renal function, normal hct/hgb, and normal WBC.  TSH mildly elevated at 5.04.     Admitted to Med surg floor for hypertensive urgency.     >Hypertensive urgency-resolved  -d/c  nicardipine, can use PRN labetalol for SBP >180  -Patient is normally on metoprolol succinate 100 mg and hydrochlorothiazide 25 mg combination pill however he stated once starting this combination pill he was experiencing dizziness and lightheadedness every time he would change positions from sitting to standing.  -pt reports palpitations and recent E-patch did note one episode of SVT; monitor on telemetry  -patient transitioned to metoprolol succinate 50 mg daily and amlodipine 5 mg daily on 07/01/22 with good control of blood pressure     >Chronic fatigue  -Discontinue home ritalin in setting of HTN urgency.  -Patient states that the fatigue could be related to anxiety and depression; would recommend trial of SSRI such as sertraline or citalopram if patient continues to have issues with chronic fatigue     >Depression  -not currently on treatment     >Left brachioplexus neuropathy  -home gabapentin 600 mg TID  -home flexeril and Norco PRN    > Erectile dysfunction  -Patient states he has not had any nocturnal or early morning erections for the last 1 year.  -He has tried Cialis in [place] which did help but he still had issues with maintaining an erection.  -I am concerned that he may have vascular or neurological etiology for erectile dysfunction and necessitates specialist consultation for further investigations.  -Outpatient referral sent to urology.    > History of mitral valve  prolapse  -Patient states his last echocardiogram was when he was a teenager.  -Outpatient echocardiogram ordered    Disposition: Patient feels significantly better this morning.  Blood pressure is well controlled.  He ambulated independently without any orthostatic hypotension or symptoms.  Patient discharged home in stable condition.    Procedures     None    Consults     None    Discharge Physical Exam     Vitals:  BP 118/76 (BP Location: Left arm)   Pulse 77   Temp 36.5 C (97.7 F) (Temporal)   Resp 12   Ht 1.778 m (5\' 10" )    Wt 77.1 kg (170 lb)   SpO2 99%   BMI 24.39 kg/m   Oxygen Requirement:        Weight:  Wt Readings from Last 3 Encounters:   06/30/22 77.1 kg (170 lb)   06/27/22 80.2 kg (176 lb 14.4 oz)   06/13/22 79.9 kg (176 lb 1.6 oz)     Physical Exam by Systems:  General: Not in acute distress. Conversant and able to make needs known.  HEENT: Normocephalic, atraumatic. PERRLA. EOMI. No lesions on nose or ears. MMM  CVS: S1 S2 heard, RRR, no appreciable murmurs/rubs/gallops  PULM: normal effort, CTAB, with no appreciable wheezes, rales, or rhonchi  ABD: Bowel sounds active, soft, non tender, not distended. No guarding rigidity or rebound tenderness.   EXT: No clubbing, cyanosis, or edema. Distal pulses 2+  NEURO: A and O to person, place, time and current events. Cranial nerves II-XII are grossly intact.  Sensations intact. Strength 5/5 in bilateral upper and lower extremities.      Foley: no    Labs     CBC BMP   Recent Labs   Lab 07/01/22  0538 06/30/22  1430   WBC 5.7 6.5   Hemoglobin 15.5 16.5   Hematocrit 44 46   Platelets 233 256        Recent Labs   Lab 07/01/22  0538 06/30/22  1431   Sodium 139 140   Potassium 4.2 3.7   Chloride 102 100   CO2 27 27   UN 14 11   Creatinine 1.14 1.06   Glucose 108* 110*   Calcium 9.7 10.2   Phosphorus 4.0  --         Coags LFTs   No results for input(s): APTT, INR, PTT, PTI in the last 168 hours.     No results for input(s): ALK, TB, HTBIL, DB, ALB, ALB1, ALT, AST, FLTP, TP in the last 168 hours.     Cardiac Enzymes Other   No results for input(s): CKTS, TROPU, TROP, MCKMB, CKMB in the last 168 hours.    No components found with this basename: RICKMBS   ABG: No results for input(s): APH, APCO2, APO2, AOSAT, ABE, LAC, CO in the last 168 hours.     Microbiology results           Radiology Reports   CT head without contrast    Result Date: 06/30/2022  Small focus of increased attenuation in the RIGHT frontal sulcus. This could represent small area of subarachnoid hemorrhage versus  mineralization. Similar finding may be present in the 2019 study. Consider further evaluation with MRI if there is continued clinical concern. END OF IMPRESSION       [facility] submits this DICOM format image data and final report to the [facility], an independent secure electronic health information exchange, on a  reciprocally searchable basis (with patient authorization) for a minimum of 12 months after exam date.    EKG 12 lead (initial)    Result Date: 06/30/2022  Sinus tachycardia Borderline  prolonged PR interval Borderline  T abnormalities, inferior leads    CT angio head    Result Date: 06/30/2022  Normal study END OF IMPRESSION       [facility] submits this DICOM format image data and final report to the [facility], an independent secure electronic health information exchange, on a reciprocally searchable basis (with patient authorization) for a minimum of 12 months after exam date.    US carotid doppler bilateral    Result Date: 06/30/2022  Mild bilateral noncalcific plaque visualized No hemodynamically significant stenosis of the internal carotid arteries. Interpretation is based upon the 2021 IAC modified diagnostic criteria adopted from the 2003 Society of Radiologists in Ultrasound Consensus Conference. END OF IMPRESSION       [facility] submits this DICOM format image data and final report to the [facility], an independent secure electronic health information exchange, on a reciprocally searchable basis (with patient authorization) for a minimum of 12 months after exam date.    *Chest standard frontal and lateral views    Result Date: 06/30/2022  Age-indeterminate compression deformity of a upper lumbar vertebral body. Correlate with site of tenderness. END OF IMPRESSION       [facility] submits this DICOM format image data and final report to the [facility], an independent secure electronic health information exchange, on a reciprocally searchable basis (with patient authorization) for a  minimum of 12 months after exam date.   Discharge Medications     Current Discharge Medication List        START taking these medications    Details AM Noon PM Bedtime   metoprolol succinate ER (TOPROL-XL) 50 mg 24 hr tablet Take 1 tablet (50 mg total) by mouth daily  for High Blood Pressure Disorder, Supraventricular Tachycardia Do not crush or chew. May be divided.  Qty: 30 tablet, Refills: 0              amLODIPine (NORVASC) 5 mg tablet Take 1 tablet (5 mg total) by mouth daily  Qty: 30 tablet, Refills: 0                   CONTINUE these medications which have NOT CHANGED    Details AM Noon PM Bedtime   cyclobenzaprine (FLEXERIL) 5 mg tablet Take 1 tablet (5 mg total) by mouth 3 times daily as needed for Muscle spasms  Qty: 30 tablet, Refills: 2              HYDROcodone-acetaminophen (NORCO) 5-325 mg per tablet Take 1 tablet by mouth every 6 hours as needed for Pain  Max daily dose: 4 tablets  Qty: 28 tablet, Refills: 0              esomeprazole magnesium (NEXIUM) 40 mg capsule Take 1 capsule (40 mg total) by mouth daily (before breakfast)  Qty: 90 capsule, Refills: 2              gabapentin 600 mg tablet Take 1 tablet (600 mg total) by mouth 3 times daily  Qty: 90 tablet, Refills: 2              fluticasone (FLONASE) 50 MCG/ACT nasal spray Spray 1 spray into nostril daily  Qty: 16 g, Refills: 5  Associated Diagnoses: Sinusitis, unspecified chronicity, unspecified location              polyethylene glycol (GLYCOLAX) 17 gm/scoop powder Take 17 g by mouth daily  Mix in 8 oz water or juice and drink.  Qty: 510 g, Refills: 1              doxycycline hyclate (VIBRAMYCIN) 100 mg capsule Take 1 capsule (100 mg total) by mouth every 12 hours for 14 days  for Infection Under the Skin  Qty: 28 capsule, Refills: 1              cholestyramine (QUESTRAN) 4 g packet Take 4 g by mouth 2 times daily (with meals)  Qty: 180 packet, Refills: 1              mupirocin (BACTROBAN) 2 % ointment Apply topically 3 times daily  for  nasal vestibulitis to the following a2reas: nasal vestibule  Qty: 22 g, Refills: 3                   STOP taking these medications       metoprolol-hydrochlorothiazide (LOPRESSOR HCT) 100-25 MG per tablet Comments:   Reason for Stopping:         methylphenidate (RITALIN) 10 mg tablet Comments:   Reason for Stopping:             Discharge Instructions     Follow up with your PCP within 7-10 days of discharge.  Notify your PCP for further medication refills.  Please sign up with [employer] app to have access to your medical information.    Discharge Activity:  Activity as tolerated    Discharge Diet:  Regular Diet    Complications:  Call your PCP's office if you are experiencing any of the following: chest pain, shortness of breath, acute abdominal pain, no BM>4 days, unable to void, fever/chills, any prolonged bleeding if on blood thinner medication      Total care time > 35 minutes today, > 50% spent counseling and coordinating care.    Signed:  June Leap, MD   07/02/2022   10:10 AM

## 2022-07-03 ENCOUNTER — Other Ambulatory Visit: Payer: Self-pay | Admitting: Physician Assistant

## 2022-07-03 DIAGNOSIS — I609 Nontraumatic subarachnoid hemorrhage, unspecified: Secondary | ICD-10-CM

## 2022-07-04 NOTE — Progress Notes (Signed)
07/04/22 1000   [facility] Patient Class Review   Patient Class Review Observation   Level of Care Review  Acute     Patient Class effective 06/30/2022.

## 2022-07-05 ENCOUNTER — Ambulatory Visit
Admission: RE | Admit: 2022-07-05 | Discharge: 2022-07-05 | Disposition: A | Discharge status: Home / Self Care | Payer: Medicare Other | Source: Ambulatory Visit | Attending: Cardiology | Admitting: Cardiology

## 2022-07-05 ENCOUNTER — Encounter: Payer: Medicare Other | Admitting: Internal Medicine

## 2022-07-05 DIAGNOSIS — I341 Nonrheumatic mitral (valve) prolapse: Secondary | ICD-10-CM | POA: Insufficient documentation

## 2022-07-05 DIAGNOSIS — I161 Hypertensive emergency: Secondary | ICD-10-CM | POA: Insufficient documentation

## 2022-07-05 LAB — ECHO COMPLETE
Aortic Diameter (mid tubular): 3.4 cm
BMI: 24.4 kg/m2
BP Diastolic: 101 mmHg
BP Systolic: 150 mmHg
BSA: 1.95 m2
Deceleration Time - MV: 249 ms
E/A ratio: 0.77
Heart Rate: 77 {beats}/min
Height: 70 in
IVC Diameter: 1.6 mm
Interventricular Septum Systolic Thickness: 1.56 cm
LA Diameter BSA Index: 1.3 cm/m2
LA Diameter Height Index: 1.5 cm/m
LA Diameter: 2.6 cm
LA Systolic Vol BSA Index: 24.6 mL/m2
LA Systolic Vol Height Index: 26.9 mL/m
LA Systolic Volume: 47.9 mL
LAA Orifice Area: 17.7 cm2
LV ASE Mass BSA Index: 95.9 gm/m2
LV ASE Mass Height 2.7 Index: 39.5 gm/m2.7
LV ASE Mass Height Index: 105.2 gm/m
LV ASE Mass: 187 gm
LV CO BSA Index: 3.06 L/min/m2
LV Cardiac Output: 5.97 L/min
LV Diastolic Volume Index: 51 mL/m2
LV Posterior Wall Thickness: 1.13 cm
LV SV - LVOT SV Diff: -2.66 mL
LV SV - LVOT SV Diff: -3.43 %
LV SV BSA Index: 39.7 mL/m2
LV SV Height Index: 43.6 mL/m
LV Septal Thickness: 1.06 cm
LV Stroke Volume: 77.5 mL
LV Systolic Volume Index: 11.2 mL/m2
LV wall/cavity ratio: 0.46
LVED Diameter BSA Index: 2.42 cm/m2
LVED Diameter Height Index: 2.65 cm/m
LVED Diameter: 4.71 cm
LVED Volume BSA Index: 51 mL/m2
LVED Volume BSA Index: 51 ml/m2
LVED Volume Height Index: 55.9 mL/m
LVED Volume: 99.4 mL
LVEF (Volume): 78 %
LVES Diameter BSA Index: 1.33 cm/m2
LVES Diameter Height Index: 1.46 cm/m
LVES Diameter: 2.6 cm
LVES Volume BSA Index: 11 ml/m2
LVES Volume BSA Index: 11.2 mL/m2
LVES Volume Height Index: 12.3 mL/m
LVES Volume: 21.9 mL
LVOT Area (calculated): 3.63 cm2
LVOT Cardiac Index: 3.17 L/min/m2
LVOT Cardiac Output: 6.17 L/min
LVOT Diameter: 2.15 cm
LVOT PWD VTI: 22.09 cm
LVOT PWD Velocity (mean): 86.3 cm/s
LVOT PWD Velocity (peak): 129.3 cm/s
LVOT SV BSA Index: 41.11 mL/m2
LVOT SV Height Index: 45.1 mL/m
LVOT Stroke Rate (mean): 313.2 mL/s
LVOT Stroke Rate (peak): 469.2 mL/s
LVOT Stroke Volume: 80.16 cc
Left Ventricle Posterior Wall Systolic Thickness: 1.68 cm
MR Regurgitant Fraction (LV SV Mtd): -0.03
MR Regurgitant Volume (LV SV Mtd): -2.7 mL
MV Peak A Velocity: 111.5 cm/s
MV Peak E Velocity: 86 cm/s
Min Velocity PR: 114.1 cm/s
PR CWD Gradient (min): 5.2 mmHg
PV CWD Velocity (peak): 0.8 cm/s
RA Volume BSA Index: 17.9 mL/m2
RA Volume Height Index: 19.7 mL/m
RA Volume: 35 mL
RR Interval: 779.22 ms
RVED Diameter BSA Index: 2.1 cm/m2
RVED Diameter Height Index: 2.3 cm/m
RVED Diameter: 4.11 cm
RVOT + PV Gradient (peak): 0 mmHg
SEM (LVOT Mean) mN-s: 72.68 mN-s
SEM (LVOT peak) mN-s: 108.89 mN-s
Tricuspid Annular S velocity: 14 cm/s
Weight (lbs): 170 [lb_av]
Weight: 2720 oz

## 2022-07-05 MED ORDER — AGITATED SODIUM CHLORIDE 0.9 % INJ (FOR ECHO) *I*
15.0000 mL | INTRAMUSCULAR | Status: AC | PRN
Start: 2022-07-05 — End: 2022-07-05
  Administered 2022-07-05: 15 mL via INTRAVENOUS

## 2022-07-07 ENCOUNTER — Ambulatory Visit
Admission: RE | Admit: 2022-07-07 | Discharge: 2022-07-07 | Disposition: A | Discharge status: Home / Self Care | Payer: Medicare Other | Source: Ambulatory Visit | Attending: Physician Assistant | Admitting: Physician Assistant

## 2022-07-07 ENCOUNTER — Other Ambulatory Visit: Payer: Self-pay

## 2022-07-07 DIAGNOSIS — I609 Nontraumatic subarachnoid hemorrhage, unspecified: Secondary | ICD-10-CM | POA: Insufficient documentation

## 2022-07-07 DIAGNOSIS — M2602 Maxillary hypoplasia: Secondary | ICD-10-CM | POA: Insufficient documentation

## 2022-07-11 ENCOUNTER — Encounter: Payer: Self-pay | Admitting: Primary Care

## 2022-07-11 ENCOUNTER — Ambulatory Visit: Payer: Medicare Other | Attending: Primary Care | Admitting: Primary Care

## 2022-07-11 ENCOUNTER — Other Ambulatory Visit: Payer: Self-pay

## 2022-07-11 VITALS — BP 160/108 | HR 103 | Temp 97.6°F | Ht 70.0 in | Wt 176.9 lb

## 2022-07-11 DIAGNOSIS — R0789 Other chest pain: Secondary | ICD-10-CM | POA: Insufficient documentation

## 2022-07-11 DIAGNOSIS — I341 Nonrheumatic mitral (valve) prolapse: Secondary | ICD-10-CM | POA: Insufficient documentation

## 2022-07-11 DIAGNOSIS — R0989 Other specified symptoms and signs involving the circulatory and respiratory systems: Secondary | ICD-10-CM | POA: Insufficient documentation

## 2022-07-11 NOTE — Progress Notes (Signed)
Patient ID: James Oneill is a 46 y.o. year old male.    Chief Complaint   Patient presents with    Follow-up     4 week     HPI:  James Oneill returns today for reevaluation of his hypertension chest pains and palpitations  Echocardiogram showed mitral valve prolapse.  24-hour Holter monitor showed a heart rate between 56 and 156.  He has been getting left-sided chest pains.  They are cramp-like.  He can get them at rest but they also seem to be brought on by exercise.  Currently he is taking amlodipine and metoprolol for his blood pressure.  Today his systolic pressure is 160.  He has been checking blood pressures at home and the systolic pressure can go down as low as 90.  No coughing or wheezing.  No associated dyspnea.    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  He understands that this increases his risk of coronary artery disease      The medication and allergy list was reviewed and is accurate  Allergy / Social History / Medications:  Allergies   Allergen Reactions    Shellfish-Derived Products Hives     Social History     Tobacco Use    Smoking status: Never    Smokeless tobacco: Never   Substance Use Topics    Alcohol use: Yes     Alcohol/week: 1.0 standard drink of alcohol     Types: 1 Glasses of wine per week     Patient's Medications   New Prescriptions    No medications on file   Previous Medications    AMLODIPINE (NORVASC) 5 MG TABLET    Take 1 tablet (5 mg total) by mouth daily    CHOLESTYRAMINE (QUESTRAN) 4 G PACKET    Take 4 g by mouth 2 times daily (with meals)    CYCLOBENZAPRINE (FLEXERIL) 5 MG TABLET    Take 1 tablet (5 mg total) by mouth 3 times daily as needed for Muscle spasms    ESOMEPRAZOLE MAGNESIUM (NEXIUM) 40 MG CAPSULE    Take 1 capsule (40 mg total) by mouth daily (before breakfast)    FLUTICASONE (FLONASE) 50 MCG/ACT NASAL SPRAY    Spray 1 spray into nostril daily    GABAPENTIN 600 MG TABLET    Take 1 tablet (600 mg  total) by mouth 3 times daily    HYDROCODONE-ACETAMINOPHEN (NORCO) 5-325 MG PER TABLET    Take 1 tablet by mouth every 6 hours as needed for Pain  Max daily dose: 4 tablets    METOPROLOL SUCCINATE ER (TOPROL-XL) 50 MG 24 HR TABLET    Take 1 tablet (50 mg total) by mouth daily  for High Blood Pressure Disorder, Supraventricular Tachycardia Do not crush or chew. May be divided.    MUPIROCIN (BACTROBAN) 2 % OINTMENT    Apply topically 3 times daily  for nasal vestibulitis to the following a2reas: nasal vestibule    POLYETHYLENE GLYCOL (GLYCOLAX) 17 GM/SCOOP POWDER    Take 17 g by mouth daily  Mix in 8 oz water or juice and drink.   Modified Medications    No medications on file   Discontinued Medications    DOXYCYCLINE HYCLATE (VIBRAMYCIN) 100 MG CAPSULE    Take 1 capsule (100 mg total) by mouth every 12 hours for 14 days  for Infection Under the Skin  Physical Exam:  Vitals:    07/11/22 1038   BP: (!) 160/108   Pulse: 103   Temp: 36.4 C (97.6 F)   Weight: 80.2 kg (176 lb 14.4 oz)   Height: 1.778 m (5\' 10" )     SpO2 Readings from Last 3 Encounters:   07/11/22 99%   07/02/22 97%   06/27/22 99%      Estimated body mass index is 25.38 kg/m as calculated from the following:    Height as of this encounter: 1.778 m (5\' 10" ).    Weight as of this encounter: 80.2 kg (176 lb 14.4 oz).  BP Readings from Last 3 Encounters:   07/11/22 (!) 160/108   07/05/22 (!) 150/101   07/02/22 134/90     Wt Readings from Last 3 Encounters:   07/11/22 80.2 kg (176 lb 14.4 oz)   07/05/22 77.1 kg (170 lb)   06/30/22 77.1 kg (170 lb)     Looks well.  Not in any acute distress  Neck-supple no adenopathy or thyromegaly  Cardiovascular-heart rate regular.  Heart sounds 1 and 2 with no added sounds no carotid bruits no edema  Respiratory-lungs are clear to palpation percussion and auscultation    Recent Lab Results:none      Assessment and Plan:     1.  Atypical chest pain  2.  Increased risk factor for coronary artery disease  3.  Mitral  valve prolapse  4.  Labile blood pressure    We will request urgent evaluation by cardiology.  In the meantime I asked him to continue with metoprolol and amlodipine    He was interested in my opinion about whether he is safe to travel to [place] at this time.  I highly recommend that he does not until he has been thoroughly evaluated  Orders this visit  Orders Placed This Encounter   Procedures    AMB REFERRAL TO [facility]IOLOGY - [facility]

## 2022-07-13 ENCOUNTER — Ambulatory Visit: Payer: Medicare Other | Admitting: Neurological Surgery

## 2022-07-13 ENCOUNTER — Other Ambulatory Visit: Payer: Self-pay | Admitting: Primary Care

## 2022-07-13 DIAGNOSIS — D1800 Hemangioma unspecified site: Secondary | ICD-10-CM

## 2022-07-13 DIAGNOSIS — D1802 Hemangioma of intracranial structures: Secondary | ICD-10-CM

## 2022-07-13 MED ORDER — HYDROCODONE-ACETAMINOPHEN 5-325 MG PO TABS *I*
1.0000 | ORAL_TABLET | Freq: Four times a day (QID) | ORAL | 0 refills | Status: DC | PRN
Start: 2022-07-13 — End: 2022-08-18

## 2022-07-13 MED ORDER — CYCLOBENZAPRINE HCL 5 MG PO TABS *I*
5.0000 mg | ORAL_TABLET | Freq: Three times a day (TID) | ORAL | 2 refills | Status: DC | PRN
Start: 2022-07-13 — End: 2022-09-29

## 2022-07-13 MED ORDER — ESOMEPRAZOLE MAGNESIUM 40 MG PO CPDR *I*
40.0000 mg | DELAYED_RELEASE_CAPSULE | Freq: Every day | ORAL | 2 refills | Status: DC
Start: 2022-07-13 — End: 2022-10-12

## 2022-07-13 NOTE — Telephone Encounter (Signed)
Last disp date for Hydrocodone 06/14/22 28 tabs for 7 days

## 2022-07-13 NOTE — Progress Notes (Signed)
Vascular Neurosurgery  Algis Greenhouse. Hessie Diener MD  Martina Sinner MD PhD  Lorelee Cover MD MSc  [facility] PA-C  [facility] PA-C  Kim Page NP        07/13/2022     History of Present Illness (HPI)     James Oneill is a 46 y.o. male h/o HTN, depression, mitral valve prolapse, tonsillar cancer s/p radical neck dissection (2018), who presented to ED at [facility] on 9/15 with headache, chest pain, and hypertension.  He was in hypertensive crisis. He had CT of the head which showed right frontal hyperdensity c/f SAH. He was neuro intact. I recommended outpatient MRI, which was done in advance of this clinic visit. He remains at neuro baseline. He was discharged on a new anti-HTN regimen.   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  Pertinent positives are noted in the HPI.  All other systems are negative.    Physical Exam     There were no vitals filed for this visit.    Telephone visit  Alert, oriented, communicative reportedly moving all extremities with antigravity strength    Assessment and Plan      46 y.o. male for whom I was recently telemetry consulted regarding headache in the setting of hypertensive urgency.  His CT scan showed right frontal hyperdensity originally concerning for subarachnoid hemorrhage.  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  This is an incidental finding that has not bled recently.  I would recommend as needed follow-up with neurosurgery      Letha Cape, MD

## 2022-07-19 ENCOUNTER — Ambulatory Visit: Payer: Medicare Other | Admitting: Cardiology

## 2022-07-27 ENCOUNTER — Telehealth: Payer: Self-pay | Admitting: Primary Care

## 2022-07-27 NOTE — Telephone Encounter (Signed)
Tried calling patient regarding his cardiology referral. Phone rings then sounds like and answer but drops     They have been trying to get him rescheduled for an appointment after no showing his 1st  appointment with Dr. Lyndel Pleasure.     Please tri again

## 2022-07-27 NOTE — Telephone Encounter (Signed)
Sent patient a mychart msg

## 2022-07-27 NOTE — Telephone Encounter (Signed)
Pt read the mychart msg "Last read by Maurine Simmering at  9:30 AM on 07/27/2022. "

## 2022-07-31 ENCOUNTER — Other Ambulatory Visit: Payer: Self-pay | Admitting: Primary Care

## 2022-07-31 MED ORDER — FLUOXETINE HCL 20 MG PO CAPS *I*
20.0000 mg | ORAL_CAPSULE | Freq: Every day | ORAL | 0 refills | Status: DC
Start: 2022-07-31 — End: 2022-10-17

## 2022-07-31 NOTE — Telephone Encounter (Signed)
Needs fluoxetine added back to his list of curretn medications so that he can request refills thru his MY CHART    In the meantime, he needs fluoxetine order sent to [facility] - 90 day supply with refill

## 2022-08-01 NOTE — Telephone Encounter (Signed)
Patient is calling to see about being put back on Fluoxetine

## 2022-08-02 NOTE — Telephone Encounter (Signed)
Medication was sent to pharmacy.

## 2022-08-03 ENCOUNTER — Ambulatory Visit: Payer: Medicare Other | Admitting: Urology

## 2022-08-08 ENCOUNTER — Ambulatory Visit: Payer: Medicare Other | Admitting: Urology

## 2022-08-18 ENCOUNTER — Encounter: Payer: Self-pay | Admitting: Primary Care

## 2022-08-18 ENCOUNTER — Telehealth: Payer: Self-pay | Admitting: Primary Care

## 2022-08-18 ENCOUNTER — Ambulatory Visit: Payer: Medicare Other | Attending: Primary Care | Admitting: Primary Care

## 2022-08-18 ENCOUNTER — Other Ambulatory Visit: Payer: Self-pay

## 2022-08-18 VITALS — BP 144/88 | HR 77 | Temp 97.4°F | Ht 70.0 in | Wt 180.0 lb

## 2022-08-18 DIAGNOSIS — N529 Male erectile dysfunction, unspecified: Secondary | ICD-10-CM | POA: Insufficient documentation

## 2022-08-18 DIAGNOSIS — R0989 Other specified symptoms and signs involving the circulatory and respiratory systems: Secondary | ICD-10-CM | POA: Insufficient documentation

## 2022-08-18 DIAGNOSIS — I341 Nonrheumatic mitral (valve) prolapse: Secondary | ICD-10-CM | POA: Insufficient documentation

## 2022-08-18 MED ORDER — AMLODIPINE BESYLATE 5 MG PO TABS *I*
5.0000 mg | ORAL_TABLET | Freq: Every day | ORAL | 1 refills | Status: DC
Start: 2022-08-18 — End: 2022-08-23

## 2022-08-18 MED ORDER — SILDENAFIL CITRATE 100 MG PO TABS *I*
100.0000 mg | ORAL_TABLET | Freq: Every day | ORAL | 2 refills | Status: DC | PRN
Start: 2022-08-18 — End: 2022-11-14

## 2022-08-18 MED ORDER — METOPROLOL SUCCINATE 50 MG PO TB24 *I*
50.0000 mg | ORAL_TABLET | Freq: Every day | ORAL | 0 refills | Status: DC
Start: 2022-08-18 — End: 2022-08-23

## 2022-08-18 MED ORDER — HYDROCODONE-ACETAMINOPHEN 5-325 MG PO TABS *I*
1.0000 | ORAL_TABLET | Freq: Four times a day (QID) | ORAL | 0 refills | Status: DC | PRN
Start: 2022-08-18 — End: 2022-09-29

## 2022-08-18 NOTE — Progress Notes (Signed)
Patient ID: James Oneill is a 46 y.o. year old male.    Chief Complaint   Patient presents with    Follow-up     One month followup     HPI: James Oneill is here today for reevaluation of his mitral valve prolapse, palpitations labile hypertension  Overall he is doing better.  Rarely has palpitations now.  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  Continues on gabapentin with occasional hydrocodone for breakthrough pain.  His bowel habit has settled down.  He is having regular bowel movements without any need for polyethylene glycol.  He is still having problems with erectile dysfunction.  Although he can achieve an erection it does not last long and is not satisfactory for intercourse        The medication and allergy list was reviewed and is accurate  Allergy / Social History / Medications:  Allergies   Allergen Reactions    Shellfish-Derived Products Hives     Social History     Tobacco Use    Smoking status: Never    Smokeless tobacco: Never   Substance Use Topics    Alcohol use: Yes     Alcohol/week: 1.0 standard drink of alcohol     Types: 1 Glasses of wine per week     Patient's Medications   New Prescriptions    No medications on file   Previous Medications    AMLODIPINE (NORVASC) 5 MG TABLET    Take 1 tablet (5 mg total) by mouth daily    CHOLESTYRAMINE (QUESTRAN) 4 G PACKET    Take 4 g by mouth 2 times daily (with meals)    CYCLOBENZAPRINE (FLEXERIL) 5 MG TABLET    Take 1 tablet (5 mg total) by mouth 3 times daily as needed for Muscle spasms    ESOMEPRAZOLE MAGNESIUM (NEXIUM) 40 MG CAPSULE    Take 1 capsule (40 mg total) by mouth daily (before breakfast)    FLUOXETINE (PROZAC) 20 MG CAPSULE    Take 1 capsule (20 mg total) by mouth daily    FLUTICASONE (FLONASE) 50 MCG/ACT NASAL SPRAY    Spray 1 spray into nostril daily    GABAPENTIN 600 MG TABLET    Take 1 tablet (600 mg total) by mouth 3 times daily     HYDROCODONE-ACETAMINOPHEN (NORCO) 5-325 MG PER TABLET    Take 1 tablet by mouth every 6 hours as needed for Pain  Max daily dose: 4 tablets    METOPROLOL SUCCINATE ER (TOPROL-XL) 50 MG 24 HR TABLET    Take 1 tablet (50 mg total) by mouth daily  for High Blood Pressure Disorder, Supraventricular Tachycardia Do not crush or chew. May be divided.    MUPIROCIN (BACTROBAN) 2 % OINTMENT    Apply topically 3 times daily  for nasal vestibulitis to the following a2reas: nasal vestibule    POLYETHYLENE GLYCOL (GLYCOLAX) 17 GM/SCOOP POWDER    Take 17 g by mouth daily  Mix in 8 oz water or juice and drink.   Modified Medications    No medications on file   Discontinued Medications    No medications on file          Physical Exam:  Vitals:    08/18/22 1127   BP: 144/88   Pulse: 77   Temp: 36.3 C (97.4 F)   Weight: 81.6 kg (180 lb)   Height: 1.778 m (5\' 10" )  SpO2 Readings from Last 3 Encounters:   08/18/22 98%   07/11/22 99%   07/02/22 97%      Estimated body mass index is 25.83 kg/m as calculated from the following:    Height as of this encounter: 1.778 m (5\' 10" ).    Weight as of this encounter: 81.6 kg (180 lb).  BP Readings from Last 3 Encounters:   08/18/22 144/88   07/11/22 (!) 160/108   07/05/22 (!) 150/101     Wt Readings from Last 3 Encounters:   08/18/22 81.6 kg (180 lb)   07/11/22 80.2 kg (176 lb 14.4 oz)   07/05/22 77.1 kg (170 lb)     Looks well.  Not in any acute distress  Neck-supple no adenopathy or thyromegaly  Cardiovascular-heart rate regular.  Heart sounds 1 and 2 with no added sounds no carotid bruits no edema  Respiratory-lungs are clear to palpation percussion and auscultation    Recent Lab Results:none      Assessment and Plan:     1.  Mitral valve prolapse  2.  Labile hypertension  3.  Erectile dysfunction.  Trial of sildenafil 100 mg  4.  Irritable bowel syndrome.  Currently under control  5.  Neuropathic pain.  Continue gabapentin.  Refill prescription for hydrocodone    Continue current  medications    Plan follow-up 3 months  Orders this visit  No orders of the defined types were placed in this encounter.    [facility] Hypertension Plan    Based on this patient's clinical history and according to JNC guidelines target BP goal is: less than 140/90 (or 150/90 due to age [facility]))  Based on the patient's last BP of BP: 144/88 the patient is: at goal  The plan to reach goal   1.  Reviewed pt's understanding of medications including any barriers to adherence.   2.  Recommended lifestyle modifications: DASH eating plan, dietary sodium reduction, and medication compliance   3.  The patient understands their clinical goals and will undertake self-management recommendations including:DASH eating plan  dietary sodium reduction  medication compliance     4.  Medication Management: no changes made    5.  Referral to Care Management:: No   6.  Patient Ed/Self-management tools provided: Yes

## 2022-08-18 NOTE — Telephone Encounter (Signed)
Patient is aware that medication is not covered by insurance.

## 2022-08-18 NOTE — Telephone Encounter (Signed)
MEDICATION PRIOR AUTH REQUIRED    PA KEY: [id number]    MEDICATION: Sildenafil    DOSAGE: 100mg      IS THIS A NEW DOSAGE?: (yes or no):       CLINICAL INDICATION FOR MEDICATION (diagnosis):   Erectile Dysfunction

## 2022-08-18 NOTE — Telephone Encounter (Signed)
Deniedtoday  Denied. This drug used for the treatment of sexual dysfunction is not a covered benefit under the Part D coverage and we do not offer supplemental benefit. Section 1927(d)(2) of the Social Security Act (the Act) permits the exclusion of certain drugs or classes of drugs from coverage under Part D.

## 2022-08-23 ENCOUNTER — Encounter: Payer: Self-pay | Admitting: Cardiology

## 2022-08-23 ENCOUNTER — Ambulatory Visit: Payer: Medicare Other | Attending: Internal Medicine | Admitting: Cardiology

## 2022-08-23 VITALS — BP 128/78 | HR 83 | Temp 97.4°F | Wt 175.0 lb

## 2022-08-23 DIAGNOSIS — Z9189 Other specified personal risk factors, not elsewhere classified: Secondary | ICD-10-CM

## 2022-08-23 DIAGNOSIS — I10 Essential (primary) hypertension: Secondary | ICD-10-CM | POA: Insufficient documentation

## 2022-08-23 DIAGNOSIS — I4729 Other ventricular tachycardia: Secondary | ICD-10-CM | POA: Insufficient documentation

## 2022-08-23 DIAGNOSIS — R079 Chest pain, unspecified: Secondary | ICD-10-CM | POA: Insufficient documentation

## 2022-08-23 DIAGNOSIS — R9431 Abnormal electrocardiogram [ECG] [EKG]: Secondary | ICD-10-CM

## 2022-08-23 LAB — EKG 12-LEAD
P: 51 deg
PR: 182 ms
QRS: 78 deg
QRSD: 103 ms
QT: 363 ms
QTc: 411 ms
Rate: 77 {beats}/min
T: 103 deg

## 2022-08-23 MED ORDER — LOSARTAN POTASSIUM 50 MG PO TABS *I*
50.0000 mg | ORAL_TABLET | Freq: Every day | ORAL | 3 refills | Status: DC
Start: 2022-08-23 — End: 2023-08-10

## 2022-08-23 MED ORDER — METOPROLOL SUCCINATE 25 MG PO TB24 *I*
25.0000 mg | ORAL_TABLET | Freq: Every day | ORAL | 3 refills | Status: DC
Start: 2022-08-23 — End: 2023-03-19

## 2022-08-23 MED ORDER — ATORVASTATIN CALCIUM 10 MG PO TABS *I*
10.0000 mg | ORAL_TABLET | Freq: Every day | ORAL | 3 refills | Status: DC
Start: 2022-08-23 — End: 2023-01-02

## 2022-08-23 NOTE — Progress Notes (Signed)
Cardiology Office Note    Date of Visit/Consult: 08/23/2022 Patient: James Oneill   Patients PCP: Eustace Quail, MD Patient DOB: 1976-08-25     New Visit  Chief Complaint & History of Present Illness     Makaveli is an 46 y.o. 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  At that time his metoprolol dose was increased from 25 to 50 mg daily and amlodipine 5 mg were added he was advised to follow-up in office.    At the time of visit this morning, he complains of occasional chest pains which are left-sided, sharp in character, 5/10 in intensity, nonexertional and nonradiating, he reports that at times they are associated with yawning.  No associated nausea or vomiting or diaphoresis  Of note, patient is physically very active and runs 2 miles quite often without any associated chest pain or shortness of breath  He also complains of intermittent palpitations.  He reports that he had a Holter monitor testing done which showed PVCs and he was advised to continue metoprolol for that    Never smoker  Drinks alcohol occasionally  No history of substance abuse  Because of his medical conditions, on disability  Family history of premature CAD in maternal grandfather who had CABG in his 26s        Past Medical and Surgical History     Past Medical History:   Diagnosis Date    Cancer     Depression     MVP (mitral valve prolapse)      Past Surgical History:   Procedure Laterality Date    ELBOW FRACTURE SURGERY      GALLBLADDER SURGERY  2019    HERNIA REPAIR  2019    umbilical    NECK SURGERY Left 2018    radical dissection    TONSILLECTOMY  2017    SCC Left tonsil         Current Outpatient Medications   Medication Sig    HYDROcodone-acetaminophen (NORCO) 5-325 mg per tablet Take 1 tablet by  mouth every 6 hours as needed for Pain  Max daily dose: 4 tablets    sildenafil (VIAGRA) 100 mg tablet Take 1 tablet (100 mg total) by mouth daily as needed for Erectile Dysfunction  Take at least 30 minutes prior to sexual activity.    FLUoxetine (PROZAC) 20 mg capsule Take 1 capsule (20 mg total) by mouth daily    cyclobenzaprine (FLEXERIL) 5 mg tablet Take 1 tablet (5 mg total) by mouth 3 times daily as needed for Muscle spasms    esomeprazole magnesium (NEXIUM) 40 mg capsule Take 1 capsule (40 mg total) by mouth daily (before breakfast)    gabapentin 600 mg tablet Take 1 tablet (600 mg total) by mouth 3 times daily    fluticasone (FLONASE) 50 MCG/ACT nasal spray Spray 1 spray into nostril daily    mupirocin (BACTROBAN) 2 % ointment Apply topically 3 times daily  for nasal vestibulitis to the following a2reas: nasal vestibule    polyethylene glycol (GLYCOLAX) 17 gm/scoop powder Take 17 g by mouth daily  Mix in 8 oz water or juice and drink.    atorvastatin (LIPITOR) 10 mg tablet Take 1 tablet (10 mg total) by mouth daily    losartan (COZAAR) 50 mg tablet Take 1 tablet (50  mg total) by mouth daily    metoprolol succinate ER (TOPROL-XL) 25 mg 24 hr tablet Take 1 tablet (25 mg total) by mouth daily  Do not crush or chew. May be divided.    cholestyramine (QUESTRAN) 4 g packet Take 4 g by mouth 2 times daily (with meals) (Patient not taking: Reported on 08/23/2022)       Social and Family History     Family History   Problem Relation Age of Onset    Cancer Father     Dementia Maternal Grandmother      Social History     Socioeconomic History    Marital status: Divorced   Tobacco Use    Smoking status: Never    Smokeless tobacco: Never   Substance and Sexual Activity    Alcohol use: Yes     Alcohol/week: 1.0 standard drink of alcohol     Types: 1 Glasses of wine per week    Drug use: Not Currently    Sexual activity: Not Currently           Medications and Allergies     Current Outpatient Medications   Medication Sig     HYDROcodone-acetaminophen (NORCO) 5-325 mg per tablet Take 1 tablet by mouth every 6 hours as needed for Pain  Max daily dose: 4 tablets    sildenafil (VIAGRA) 100 mg tablet Take 1 tablet (100 mg total) by mouth daily as needed for Erectile Dysfunction  Take at least 30 minutes prior to sexual activity.    FLUoxetine (PROZAC) 20 mg capsule Take 1 capsule (20 mg total) by mouth daily    cyclobenzaprine (FLEXERIL) 5 mg tablet Take 1 tablet (5 mg total) by mouth 3 times daily as needed for Muscle spasms    esomeprazole magnesium (NEXIUM) 40 mg capsule Take 1 capsule (40 mg total) by mouth daily (before breakfast)    gabapentin 600 mg tablet Take 1 tablet (600 mg total) by mouth 3 times daily    fluticasone (FLONASE) 50 MCG/ACT nasal spray Spray 1 spray into nostril daily    mupirocin (BACTROBAN) 2 % ointment Apply topically 3 times daily  for nasal vestibulitis to the following a2reas: nasal vestibule    polyethylene glycol (GLYCOLAX) 17 gm/scoop powder Take 17 g by mouth daily  Mix in 8 oz water or juice and drink.    atorvastatin (LIPITOR) 10 mg tablet Take 1 tablet (10 mg total) by mouth daily    losartan (COZAAR) 50 mg tablet Take 1 tablet (50 mg total) by mouth daily    metoprolol succinate ER (TOPROL-XL) 25 mg 24 hr tablet Take 1 tablet (25 mg total) by mouth daily  Do not crush or chew. May be divided.    cholestyramine (QUESTRAN) 4 g packet Take 4 g by mouth 2 times daily (with meals) (Patient not taking: Reported on 08/23/2022)     He is allergic to shellfish-derived products.        Review of Systems     ROS  complains of occasional chest pains which are left-sided, sharp in character, 5/10 in intensity, nonexertional and nonradiating, he reports that at times they are associated with yawning.  No associated nausea or vomiting or diaphoresis  Of note, patient is physically very active and runs 2 miles quite often without any associated chest pain or shortness of breath  He also complains of intermittent  palpitations.  He reports that he had a Holter monitor testing done which showed PVCs and he was advised  to continue metoprolol for that    Denies shortness of breath, dyspnea on exertion, orthopnea, PND, leg swelling  Denies lightheadedness, dizziness, syncope, presyncope, episodes of loss of consciousness  Denies any known history of DVT/PE/blood clots    Denies fever, chills, rash, eye or ear discharge, double vision, headaches  Denies belly pain, diarrhea, urinary symptoms or focal weakness        Vitals and Physical Exam     Alexandr's  weight is 79.4 kg (175 lb). His temporal temperature is 36.3 C (97.4 F). His blood pressure is 128/78 and his pulse is 83. His oxygen saturation is 96%.  Body mass index is 25.11 kg/m.  Vitals:    08/23/22 1029   BP: 128/78   Pulse: 83   Temp: 36.3 C (97.4 F)   TempSrc: Temporal   SpO2: 96%   Weight: 79.4 kg (175 lb)    Body mass index is 25.11 kg/m. Body surface area is 1.98 meters squared.  Last 4 Weights    08/23/22 1029   Weight: 79.4 kg (175 lb)       Physical Exam  Vitals signs reviewed  Patient in no acute distress  Alert and interactive  Oriented X 3  Moist mucous membranes,no eye or ear discharge  PERRLA  No JVD  RRR, S1S2 +  Equal air entry bilaterally, no wheezing or crepitations  Surgical scar noted on left side of neck  Soft abdomen, nontender, bowel sounds present  No suprapubic tenderness  No pedal edema  Skin warm  Affect normal    Laboratory Data     Hematology:   Results in Past 730 Days  Result Component Current Result Previous Result   WBC 5.7 (07/01/2022) 6.5 (06/30/2022)   Hemoglobin 15.5 (07/01/2022) 16.5 (06/30/2022)   Hematocrit 44 (07/01/2022) 46 (06/30/2022)   Platelets 233 (07/01/2022) 256 (06/30/2022)     Chemistry:   Results in Past 730 Days  Result Component Current Result Previous Result   Sodium 139 (07/01/2022) 140 (06/30/2022)   Potassium 4.2 (07/01/2022) 3.7 (06/30/2022)   Creatinine 1.14 (07/01/2022) 1.06 (06/30/2022)   Glucose 108 (H) (07/01/2022) 110  (H) (06/30/2022)   Calcium 9.7 (07/01/2022) 10.2 (06/30/2022)   Magnesium 2.2 (07/01/2022) 2.0 (06/13/2022)   AST 29 (06/13/2022) Not in Time Range   ALT 13 (06/13/2022) Not in Time Range   TSH 5.04 (H) (06/30/2022) 5.74 (H) (06/13/2022)     Coagulation Studies:   No results found for requested labs within last 730 days.             Cardiac:   TroponinT  Results in Past 730 Days  Result Component Current Result Previous Result   TROP T 0 HR High Sensitivity 7 (06/30/2022) Not in Time Range   TROP T 1 HR High Sensitivity 7 (06/30/2022) Not in Time Range   TROP T 3 HR High Sensitivity 7 (06/30/2022) Not in Time Range     Pro-BNP  No results found for requested labs within last 730 days.     Magnesium  Results in Past 730 Days  Result Component Current Result Previous Result   Magnesium 2.2 (07/01/2022) 2.0 (06/13/2022)             Lipids:   Results in Past 730 Days  Result Component Current Result Previous Result   Cholesterol 226 (!) (06/13/2022) Not in Time Range   HDL 50 (06/13/2022) Not in Time Range   Triglycerides 352 (!) (06/13/2022) Not in Time Range   LDL Calculated 106 (  06/13/2022) Not in Time Range   Chol/HDL Ratio 4.5 (06/13/2022) Not in Time Range     TSH, free T4:  Lab Results   Component Value Date/Time    TSH 5.04 (H) 06/30/2022 02:31 PM    TSH 5.74 (H) 06/13/2022 12:35 PM    TSH 4.11 01/20/2021 01:04 PM    FT4 1.3 06/13/2022 12:35 PM    FT4 1.1 01/20/2021 01:04 PM    FT4 1.11 02/10/2020 10:42 AM                      Cardiac/Imaging Data & Risk Scores     ECG(s)/telemetry reviewed  EKG from 08/23/2022 at 1033 hrs. shows heart rate 77, normal sinus rhythm, no acute ischemic changes, specific T wave inversions in inferior leads,         ECHO COMPLETE 07/05/2022    Interpretation Summary  Normal LV function  Bileaflet prolapse of the mitral valve with mild regurgitation  No right to left shunting with bubble injection               EPATCH 2-DAY MONITOR 06/30/2022    Narrative  Patient monitored for 1d 23h, analyzable time  was 1d 23h starting on 06/23/2022 01:30 pm.  Primary rhythm was Sinus Rhythm. Average heart rate was 77 bpm, Minimum heart rate was 56 bpm on Day 2 /  04:57:51 am, Max heart rate was 156 bpm on Day 2 / 03:34:40 pm  SVE(s): Burden was 0.07 %, 159 total SVE(s)  SVT (AT, RT): 3 beat episode of SVT noted  PVC(s): Burden was 0.04 %, 89 total PVC(s), 3 disparate morphologies. 1 episode of NSVT.  Patient recorded 7 events during the monitoring period corresponding to SR and sinus tachycardia.    MEHMET AKTAS, MD             Current Outpatient Medications   Medication Sig    HYDROcodone-acetaminophen (NORCO) 5-325 mg per tablet Take 1 tablet by mouth every 6 hours as needed for Pain  Max daily dose: 4 tablets    sildenafil (VIAGRA) 100 mg tablet Take 1 tablet (100 mg total) by mouth daily as needed for Erectile Dysfunction  Take at least 30 minutes prior to sexual activity.    FLUoxetine (PROZAC) 20 mg capsule Take 1 capsule (20 mg total) by mouth daily    cyclobenzaprine (FLEXERIL) 5 mg tablet Take 1 tablet (5 mg total) by mouth 3 times daily as needed for Muscle spasms    esomeprazole magnesium (NEXIUM) 40 mg capsule Take 1 capsule (40 mg total) by mouth daily (before breakfast)    gabapentin 600 mg tablet Take 1 tablet (600 mg total) by mouth 3 times daily    fluticasone (FLONASE) 50 MCG/ACT nasal spray Spray 1 spray into nostril daily    mupirocin (BACTROBAN) 2 % ointment Apply topically 3 times daily  for nasal vestibulitis to the following a2reas: nasal vestibule    polyethylene glycol (GLYCOLAX) 17 gm/scoop powder Take 17 g by mouth daily  Mix in 8 oz water or juice and drink.    atorvastatin (LIPITOR) 10 mg tablet Take 1 tablet (10 mg total) by mouth daily    losartan (COZAAR) 50 mg tablet Take 1 tablet (50 mg total) by mouth daily    metoprolol succinate ER (TOPROL-XL) 25 mg 24 hr tablet Take 1 tablet (25 mg total) by mouth daily  Do not crush or chew. May be divided.    cholestyramine (QUESTRAN) 4 g packet  Take 4  g by mouth 2 times daily (with meals) (Patient not taking: Reported on 08/23/2022)       Impression and Plan     ASSESSMENT & PLAN/RECOMMENDATIONS:    1.  Monitoring for cancer therapy-related cardiac dysfunction (CTRCD)  Patient has history of tonsillar cancer for which he had radiation treatment about 5 years ago followed by neck dissection with lymph node removal.  He is complaining of chest pain which although does not appear anginal but patients with history of radiation can have atypical chest pain.  Would recommend:  Exercise stress echocardiogram  Start Lipitor and repeat lipid panel in about 3 months    2  Hypertension  Fairly controlled  On amlodipine and metoprolol 50 mg daily  In view of patient's history of erectile dysfunction, discussed with patient and patient would like to decrease the dose of metoprolol  Also changing amlodipine to losartan  Monitor closely      #3  History of mitral valve prolapse with mild mitral regurgitation  Seems to be asymptomatic from that standpoint  He will need serial monitoring with echocardiogram, will do next echo in about 2 years      4  Dyslipidemia  LDL 106  Discussed with patient and patient would like to start low-dose statins in view of his history of radiation treatment to the chest which can accelerate atherosclerosis.  Discussed side effect profile and advised to monitor closely  Repeat lipid panel in about 3 months    5.  Palpitations  Holter monitor showed PVCs and 1 run of NSVT  Continue Toprol-XL and monitor for symptoms  Repeat E patch monitoring in about 2 months follow-up on frequency/burden of NSVT's.        Lifestyle modification discussed, advised:  - Low salt, heart healthy, mediterranean diet/whole food plant based diet with high fiber.  - Exercise: 30 minutes of moderate intensity exercise, atleast 5 days a week (as tolerated).  - Optimize weight, no smoking/tobacco, minimize/stop alcohol.          Orders Placed This Encounter   Procedures     ePatch 7 Day Monitor    EKG 12 lead    Exercise Stress Echo Complete       Care plan discussed with patient.  Patient was given the opportunity to ask questions, all of them were answered fully and seemingly to their satisfaction. Patient was advised to call for any questions/concerns or if there are any new symptoms/signs before the next scheduled clinic visit. Also, advised patient to follow up with primary medical doctor on regular basis.      Roma Kayser, MD  Cardiology Attending  Electronically signed on 08/23/2022 at 12:07 PM.  (This note was generated using a continuous voice recognition software [employer] engineer) to expedite communication.  If errors of transcription are noted, please let us/our office know.)

## 2022-08-23 NOTE — Patient Instructions (Signed)
-   Start new medications as we discussed, prescriptions have been sent to your pharmacy.  - Maintain regular follow-up with your primary medical doctor.  - Diet, Exercise & Lifestyle modification recommendations: 1)Low salt, heart healthy, mediterranean diet/whole food plant based diet, with high fiber content.  2) 30 minutes of moderate intensity exercise, atleast 5 days a week (as tolerated).  3) Optimize weight, no smoking/tobacco, minimize/stop alcohol.  - Please bring your medications/pill bottles with you at each visit (so that your medication list is fully updated/reconciled).

## 2022-08-25 ENCOUNTER — Ambulatory Visit: Payer: Medicare Other | Admitting: Family

## 2022-09-05 ENCOUNTER — Other Ambulatory Visit: Payer: Self-pay

## 2022-09-05 MED ORDER — GABAPENTIN 600 MG PO TABLET *I*
600.0000 mg | ORAL_TABLET | Freq: Three times a day (TID) | ORAL | 2 refills | Status: DC
Start: 2022-09-05 — End: 2022-12-17

## 2022-09-18 NOTE — Progress Notes (Unsigned)
[facility] UROLOGY     09/18/2022  Referring provider: June Leap, MD  [address] [place],  [place] [address]      History of Present Illness:   This patient is a 47 y.o. male with PMH most notable for  has a past medical history of Cancer, Depression, and MVP (mitral valve prolapse).     Patient presents today with:  Erectile dysfunction  ***    On Viagra 100 mg as needed  PVR ***    Review of System:  I have reviewed, confirmed, and made changes as appropriate.  Details include:    Review of Systems          Current Medications:  Current Outpatient Medications   Medication Sig    gabapentin 600 mg tablet Take 1 tablet (600 mg total) by mouth 3 times daily    atorvastatin (LIPITOR) 10 mg tablet Take 1 tablet (10 mg total) by mouth daily    losartan (COZAAR) 50 mg tablet Take 1 tablet (50 mg total) by mouth daily    metoprolol succinate ER (TOPROL-XL) 25 mg 24 hr tablet Take 1 tablet (25 mg total) by mouth daily  Do not crush or chew. May be divided.    HYDROcodone-acetaminophen (NORCO) 5-325 mg per tablet Take 1 tablet by mouth every 6 hours as needed for Pain  Max daily dose: 4 tablets    sildenafil (VIAGRA) 100 mg tablet Take 1 tablet (100 mg total) by mouth daily as needed for Erectile Dysfunction  Take at least 30 minutes prior to sexual activity.    FLUoxetine (PROZAC) 20 mg capsule Take 1 capsule (20 mg total) by mouth daily    cyclobenzaprine (FLEXERIL) 5 mg tablet Take 1 tablet (5 mg total) by mouth 3 times daily as needed for Muscle spasms    esomeprazole magnesium (NEXIUM) 40 mg capsule Take 1 capsule (40 mg total) by mouth daily (before breakfast)    cholestyramine (QUESTRAN) 4 g packet Take 4 g by mouth 2 times daily (with meals) (Patient not taking: Reported on 08/23/2022)    fluticasone (FLONASE) 50 MCG/ACT nasal spray Spray 1 spray into nostril daily    mupirocin (BACTROBAN) 2 % ointment Apply topically 3 times daily  for nasal vestibulitis to the following a2reas: nasal vestibule     polyethylene glycol (GLYCOLAX) 17 gm/scoop powder Take 17 g by mouth daily  Mix in 8 oz water or juice and drink.         Past Medical History:   Diagnosis Date    Cancer     Depression     MVP (mitral valve prolapse)        Past Surgical History:   Procedure Laterality Date    ELBOW FRACTURE SURGERY      GALLBLADDER SURGERY  2019    HERNIA REPAIR  2019    umbilical    NECK SURGERY Left 2018    radical dissection    TONSILLECTOMY  2017    SCC Left tonsil       Allergies   Allergen Reactions    Shellfish-Derived Products Hives       Physical Exam:  There were no vitals filed for this visit.    Performance status: ECOG: {performance status:25581}     Mental status: Normal Mental State, Pleasant patient   Constitutional: Normal grooming and hygiene, no apparent distress   HEENT: No neck masses identified   Respiratory: Unlabored breathing, normal chest excursion, no use of accessory muscles  of respiration   Cardiac: No pedal edema.    Abdomen:  Soft, nontender  Surgical incisions: ***   Genitalia: {Blank single:19197::"Not examined.","See below"}    Inguinal lymph nodes: {Blank single:19197::"Not examined.","No palpable lymphadenopathy"}   Penis: {Blank single:19197::"Not examined.","Normal, circumcised","Normal, uncircumcised"}   Urethral meatus: {Blank single:19197::"Not examined.","Normal"}  Scrotum: {Blank single:19197::"Not examined.","No edema, no erythema, no lesions, palpable hydroceles or spermatoceles"} Testicles: {Blank single:19197::"Not examined.","Bilaterally descended without masses, nontender"}   Epididymis: {Blank single:19197::"Not examined.","Bilateral nontender"}   Inguinal hernia: {Blank single:19197::"Not examined.","Absent bilaterally"}      Digital rectal examination: {Blank single:19197::"Not examined.","Anal tone ***, prostate {Desc; not/enlarged/no palpable mass:32113}, no nodules or lesions"}   Extremities: No pitting edema   Musculoskeletal: Normal range of motion, good strength and tone    Neurologic: Cranial nerves grossly intact, no focal weakness or spasticity   Dermatological: Absence of rashes, skin erosions or pathological changes        Labs:      Lab results: 07/01/22  0538   Sodium 139   Potassium 4.2   Chloride 102   CO2 27   UN 14   Creatinine 1.14   Glucose 108*   Calcium 9.7           Lab results: 07/01/22  0538   WBC 5.7   Hemoglobin 15.5   Hematocrit 44   RBC 5.1   Platelets 233       No results found for: "PSAR", "PSAR3"  No results for input(s): "UAPP", "UCOL", "UAGLU", "KETONESU", "USG", "UBLD", "UAPH", "UPRO", "UNITR", "ULEU", "URBC", "UWBC", "UMUC", "GLUCOSEUA", "KETONESUA", "SPECIFICGRAV", "BLOODUA", "PHUA", "PROTEIN", "NITRITEUA", "LEUKESTERASE", "GLUCOSESIE", "KETONESIEM", "PUSG", "BLOODSIEMEN", "PUAPH", "PROTEINUA", "PUNIT", "LEUKOCYTESIE" in the last 8760 hours.            I have reviewed this patient's lab work.    Imaging:  No images are attached to the encounter.  No images are attached to the encounter or orders placed in the encounter.  EKG 12 lead  Narrative: - BORDERLINE ECG -  Impression: Sinus rhythm  Borderline  T abnormalities, inferior leads      I have reviewed any available imaging         Assessment  ***  ***       Plan  ***    All questions were answered and concerns addressed.     Follow up: ***     Derrek Gu, PA 09/18/2022 12:13 PM    Please call with questions or concerns,    [facility], [place], [place]

## 2022-09-19 ENCOUNTER — Ambulatory Visit: Payer: Medicare Other | Admitting: Urology

## 2022-09-29 ENCOUNTER — Other Ambulatory Visit: Payer: Self-pay | Admitting: Primary Care

## 2022-10-02 MED ORDER — CYCLOBENZAPRINE HCL 5 MG PO TABS *I*
5.0000 mg | ORAL_TABLET | Freq: Three times a day (TID) | ORAL | 2 refills | Status: DC | PRN
Start: 2022-10-02 — End: 2023-03-19

## 2022-10-02 MED ORDER — HYDROCODONE-ACETAMINOPHEN 5-325 MG PO TABS *I*
1.0000 | ORAL_TABLET | Freq: Four times a day (QID) | ORAL | 0 refills | Status: DC | PRN
Start: 2022-10-02 — End: 2023-01-01

## 2022-10-02 NOTE — Telephone Encounter (Signed)
Last dispensed 08/21/2022 for a 7 day supply.

## 2022-10-12 ENCOUNTER — Other Ambulatory Visit: Payer: Self-pay | Admitting: Primary Care

## 2022-10-12 MED ORDER — ESOMEPRAZOLE MAGNESIUM 40 MG PO CPDR *I*
40.0000 mg | DELAYED_RELEASE_CAPSULE | Freq: Every day | ORAL | 2 refills | Status: DC
Start: 2022-10-12 — End: 2023-03-19

## 2022-10-17 ENCOUNTER — Other Ambulatory Visit: Payer: Self-pay | Admitting: Primary Care

## 2022-10-17 DIAGNOSIS — F32A Depression, unspecified: Secondary | ICD-10-CM

## 2022-10-23 ENCOUNTER — Ambulatory Visit
Admission: RE | Admit: 2022-10-23 | Discharge: 2022-10-23 | Disposition: A | Discharge status: Home / Self Care | Payer: Medicare Other | Source: Ambulatory Visit | Attending: Cardiology | Admitting: Cardiology

## 2022-10-23 DIAGNOSIS — R079 Chest pain, unspecified: Secondary | ICD-10-CM

## 2022-10-23 DIAGNOSIS — I4729 Other ventricular tachycardia: Secondary | ICD-10-CM

## 2022-11-06 ENCOUNTER — Encounter: Payer: Medicare Other | Admitting: Cardiology

## 2022-11-06 DIAGNOSIS — I4729 Other ventricular tachycardia: Secondary | ICD-10-CM

## 2022-11-06 DIAGNOSIS — R079 Chest pain, unspecified: Secondary | ICD-10-CM

## 2022-11-09 ENCOUNTER — Ambulatory Visit: Payer: Medicare Other

## 2022-11-14 ENCOUNTER — Other Ambulatory Visit: Payer: Self-pay | Admitting: Primary Care

## 2022-11-17 ENCOUNTER — Ambulatory Visit: Payer: Medicare Other | Admitting: Primary Care

## 2022-11-17 ENCOUNTER — Encounter: Payer: Self-pay | Admitting: Primary Care

## 2022-11-17 ENCOUNTER — Telehealth: Payer: Self-pay | Admitting: Primary Care

## 2022-11-17 NOTE — Telephone Encounter (Signed)
Called and left another message

## 2022-11-17 NOTE — Telephone Encounter (Signed)
Patient missed his 3 month FUV with Dr. Dwaine Gale. This needs to be reschedule. I have called and left a detailed message for patient to return call and schedule. Thank you.

## 2022-11-20 NOTE — Telephone Encounter (Signed)
Left a detailed message for patient to call back for scheduling. Thank you.

## 2022-12-05 ENCOUNTER — Ambulatory Visit: Payer: Medicare Other

## 2022-12-17 ENCOUNTER — Other Ambulatory Visit: Payer: Self-pay | Admitting: Primary Care

## 2022-12-17 ENCOUNTER — Other Ambulatory Visit: Payer: Self-pay | Admitting: Cardiology

## 2022-12-17 ENCOUNTER — Other Ambulatory Visit: Payer: Self-pay | Admitting: Otolaryngology

## 2022-12-17 DIAGNOSIS — F32A Depression, unspecified: Secondary | ICD-10-CM

## 2022-12-18 MED ORDER — MUPIROCIN 2 % EX OINT *I*
TOPICAL_OINTMENT | Freq: Three times a day (TID) | CUTANEOUS | 3 refills | Status: DC
Start: 2022-12-18 — End: 2023-08-13

## 2022-12-18 MED ORDER — GABAPENTIN 600 MG PO TABLET *I*
600.0000 mg | ORAL_TABLET | Freq: Three times a day (TID) | ORAL | 2 refills | Status: DC
Start: 2022-12-18 — End: 2023-03-19

## 2022-12-18 MED ORDER — FLUOXETINE HCL 20 MG PO CAPS *I*
20.0000 mg | ORAL_CAPSULE | Freq: Every day | ORAL | 0 refills | Status: DC
Start: 2022-12-18 — End: 2023-01-23

## 2022-12-18 NOTE — Telephone Encounter (Signed)
Norco last filled December

## 2022-12-26 ENCOUNTER — Other Ambulatory Visit: Payer: Self-pay | Admitting: Primary Care

## 2023-01-01 ENCOUNTER — Other Ambulatory Visit
Admission: RE | Admit: 2023-01-01 | Discharge: 2023-01-01 | Disposition: A | Discharge status: Home / Self Care | Payer: Medicare Other | Source: Ambulatory Visit

## 2023-01-01 ENCOUNTER — Ambulatory Visit: Payer: Medicare Other | Attending: Primary Care | Admitting: Primary Care

## 2023-01-01 ENCOUNTER — Encounter: Payer: Self-pay | Admitting: Primary Care

## 2023-01-01 ENCOUNTER — Other Ambulatory Visit: Payer: Self-pay

## 2023-01-01 ENCOUNTER — Telehealth: Payer: Self-pay | Admitting: Primary Care

## 2023-01-01 VITALS — BP 120/80 | HR 71 | Temp 96.8°F | Ht 70.0 in | Wt 186.1 lb

## 2023-01-01 DIAGNOSIS — E785 Hyperlipidemia, unspecified: Secondary | ICD-10-CM

## 2023-01-01 DIAGNOSIS — M545 Low back pain, unspecified: Secondary | ICD-10-CM | POA: Insufficient documentation

## 2023-01-01 DIAGNOSIS — R1011 Right upper quadrant pain: Secondary | ICD-10-CM | POA: Insufficient documentation

## 2023-01-01 DIAGNOSIS — R5383 Other fatigue: Secondary | ICD-10-CM

## 2023-01-01 DIAGNOSIS — M7061 Trochanteric bursitis, right hip: Secondary | ICD-10-CM | POA: Insufficient documentation

## 2023-01-01 LAB — T4, FREE: Free T4: 1.1 ng/dL (ref 0.9–1.7)

## 2023-01-01 LAB — COMPREHENSIVE METABOLIC PANEL
ALT: 18 U/L (ref 0–50)
AST: 26 U/L (ref 0–50)
Albumin: 5 g/dL (ref 3.5–5.2)
Alk Phos: 58 U/L (ref 40–130)
Anion Gap: 12 (ref 7–16)
Bilirubin,Total: 1.9 mg/dL — ABNORMAL HIGH (ref 0.0–1.2)
CO2: 28 mmol/L (ref 20–28)
Calcium: 10.3 mg/dL — ABNORMAL HIGH (ref 8.6–10.2)
Chloride: 100 mmol/L (ref 96–108)
Creatinine: 1.28 mg/dL — ABNORMAL HIGH (ref 0.67–1.17)
Glucose: 86 mg/dL (ref 60–99)
Lab: 16 mg/dL (ref 6–20)
Potassium: 4.7 mmol/L — ABNORMAL HIGH (ref 3.3–4.6)
Sodium: 140 mmol/L (ref 133–145)
Total Protein: 7.6 g/dL (ref 6.3–7.7)
eGFR BY CREAT: 69 *

## 2023-01-01 LAB — LIPID PANEL
Chol/HDL Ratio: 5.7
Cholesterol: 266 mg/dL — AB
HDL: 47 mg/dL (ref 40–60)
LDL Calculated: 149 mg/dL — AB
Non HDL Cholesterol: 219 mg/dL
Triglycerides: 351 mg/dL — AB

## 2023-01-01 LAB — TSH: TSH: 4.66 u[IU]/mL — ABNORMAL HIGH (ref 0.27–4.20)

## 2023-01-01 MED ORDER — HYDROCODONE-ACETAMINOPHEN 5-325 MG PO TABS *I*
1.0000 | ORAL_TABLET | Freq: Four times a day (QID) | ORAL | 0 refills | Status: DC | PRN
Start: 2023-01-01 — End: 2023-02-13

## 2023-01-01 NOTE — Telephone Encounter (Signed)
Patient presented to front desk states he needs to have Thyroid added to blood work. States that blood has been drawn. Thank you

## 2023-01-01 NOTE — Progress Notes (Signed)
Patient ID: James Oneill is a 47 y.o. year old male.    Chief Complaint   Patient presents with    Follow-up     3 month/ possible hernia     HPI: James Oneill is here today for reevaluation of his hyperlipidemia and chronic left shoulder pain.  He takes hydrocodone on average twice a week not only for his chronic shoulder pain but also for some low back pain he has developed over the last several weeks.  Back pain is associated with stiffness.  It does not radiate but he does have tenderness on the left hip.  No history of injury.  He is concerned that he may have an abdominal wall hernia.  He has noticed a tender lump to the right of the rectus abdominis.  He has recently been doing some abdominal strengthening exercises.  He is s/p cholecystectomy        The medication and allergy list was reviewed and is accurate  Allergy / Social History / Medications:  Allergies   Allergen Reactions    Shellfish-Derived Products Hives     Social History     Tobacco Use    Smoking status: Never    Smokeless tobacco: Never   Substance Use Topics    Alcohol use: Yes     Alcohol/week: 1.0 standard drink of alcohol     Types: 1 Glasses of wine per week     Patient's Medications   New Prescriptions    No medications on file   Previous Medications    ATORVASTATIN (LIPITOR) 10 MG TABLET    Take 1 tablet (10 mg total) by mouth daily    CHOLESTYRAMINE (QUESTRAN) 4 G PACKET    Take 4 g by mouth 2 times daily (with meals)    CYCLOBENZAPRINE (FLEXERIL) 5 MG TABLET    Take 1 tablet (5 mg total) by mouth 3 times daily as needed for Muscle spasms    ESOMEPRAZOLE MAGNESIUM (NEXIUM) 40 MG CAPSULE    Take 1 capsule (40 mg total) by mouth daily (before breakfast)    FLUOXETINE (PROZAC) 20 MG CAPSULE    Take 1 capsule (20 mg total) by mouth daily    FLUTICASONE (FLONASE) 50 MCG/ACT NASAL SPRAY    Spray 1 spray into nostril daily    GABAPENTIN 600 MG TABLET    Take 1 tablet (600 mg total) by mouth 3 times daily    LOSARTAN (COZAAR) 50 MG TABLET     Take 1 tablet (50 mg total) by mouth daily    METOPROLOL SUCCINATE ER (TOPROL-XL) 25 MG 24 HR TABLET    Take 1 tablet (25 mg total) by mouth daily  Do not crush or chew. May be divided.    MUPIROCIN (BACTROBAN) 2 % OINTMENT    Apply topically 3 times daily  for nasal vestibulitis to the following a2reas: nasal vestibule    POLYETHYLENE GLYCOL (GLYCOLAX) 17 GM/SCOOP POWDER    Take 17 g by mouth daily  Mix in 8 oz water or juice and drink.    SILDENAFIL (VIAGRA) 100 MG TABLET    TAKE 1 TABLET BY MOUTH DAILY AS NEEDED FOR ERECTILE DYSFUNCTION TAKE AT LEAST 30 MINUTES PRIOR TO SEXUAL ACTIVITY   Modified Medications    Modified Medication Previous Medication    HYDROCODONE-ACETAMINOPHEN (NORCO) 5-325 MG PER TABLET HYDROcodone-acetaminophen (NORCO) 5-325 mg per tablet       Take 1 tablet by mouth every 6 hours as needed for Pain  Max daily  dose: 4 tablets    Take 1 tablet by mouth every 6 hours as needed for Pain  Max daily dose: 4 tablets   Discontinued Medications    No medications on file          Physical Exam:  Vitals:    01/01/23 1008   BP: 120/80   Pulse: 71   Temp: 36 C (96.8 F)   Weight: 84.4 kg (186 lb 1.6 oz)   Height: 1.778 m (5\' 10" )     SpO2 Readings from Last 3 Encounters:   01/01/23 99%   10/23/22 97%   08/23/22 96%      Estimated body mass index is 26.7 kg/m as calculated from the following:    Height as of this encounter: 1.778 m (5\' 10" ).    Weight as of this encounter: 84.4 kg (186 lb 1.6 oz).  BP Readings from Last 3 Encounters:   01/01/23 120/80   10/23/22 142/78   08/23/22 128/78     Wt Readings from Last 3 Encounters:   01/01/23 84.4 kg (186 lb 1.6 oz)   08/23/22 79.4 kg (175 lb)   08/18/22 81.6 kg (180 lb)     Looks well.  Not in any acute distress  Neck-supple no adenopathy or thyromegaly  Cardiovascular-heart rate regular.  Heart sounds 1 and 2 with no added sounds no carotid bruits no edema  Respiratory-lungs are clear to palpation percussion and auscultation    Spine-straight.  Normal range  of movement.  No local tenderness.  Straight leg raising 90 degrees bilaterally  Lower extremity sensation tone power normal and symmetrical.  Left hip-normal range of motion.  He is tender over the greater trochanter    Abdomen is soft without any masses.  No obvious swelling is noted.  He is tender to the right of the rectus abdominis no obvious masses.  Recent Lab Results:none      Assessment and Plan:     1.  Hyperlipidemia.  Check lipids  2.  Low back pain.  Referral to physical therapy  3.  Left trochanteric bursitis.  Referral to physical therapy  4.  Abdominal wall pain.  Evaluate for hernia with ultrasound    Continue low-fat diet.  Follow-up 3 months  Orders this visit  Orders Placed This Encounter   Procedures    US abdomen limited single quad or f/u specify    Comprehensive metabolic panel    Lipid Panel (Reflex to Direct  LDL if Triglycerides more than 400)    AMB REFERRAL TO PHYSICAL / OCCUPATIONAL THERAPY

## 2023-01-01 NOTE — Telephone Encounter (Signed)
Thyroid function orders placed as an add-on

## 2023-01-02 ENCOUNTER — Telehealth: Payer: Self-pay | Admitting: Primary Care

## 2023-01-02 MED ORDER — ATORVASTATIN CALCIUM 20 MG PO TABS *I*
20.0000 mg | ORAL_TABLET | Freq: Every day | ORAL | 3 refills | Status: DC
Start: 2023-01-02 — End: 2023-05-23

## 2023-01-02 NOTE — Telephone Encounter (Signed)
Patient notified

## 2023-01-02 NOTE — Telephone Encounter (Signed)
His cholesterol is not at goal.  I am going to increase the dose of the atorvastatin to 20 mg.  Prescription sent to pharmacy for him

## 2023-01-03 ENCOUNTER — Ambulatory Visit
Admission: RE | Admit: 2023-01-03 | Discharge: 2023-01-03 | Disposition: A | Discharge status: Home / Self Care | Payer: Medicare Other | Source: Ambulatory Visit | Attending: Primary Care | Admitting: Primary Care

## 2023-01-03 ENCOUNTER — Telehealth: Payer: Self-pay

## 2023-01-03 DIAGNOSIS — R1011 Right upper quadrant pain: Secondary | ICD-10-CM | POA: Insufficient documentation

## 2023-01-03 NOTE — Telephone Encounter (Signed)
-----   Message from Eustace Quail, MD sent at 01/03/2023 12:23 PM EDT -----  Results have been received for workup recently undertaken for:     Patient:  James Oneill DOB:  1976-01-16  The results are within normal limits.  No hernia seen  Our office will communicate these results to patient via phone call  Eustace Quail, MD  January 03, 2023 12:22 PM

## 2023-01-23 ENCOUNTER — Other Ambulatory Visit: Payer: Self-pay | Admitting: Primary Care

## 2023-01-23 DIAGNOSIS — F32A Depression, unspecified: Secondary | ICD-10-CM

## 2023-01-23 MED ORDER — FLUOXETINE HCL 20 MG PO CAPS *I*
20.0000 mg | ORAL_CAPSULE | Freq: Every day | ORAL | 0 refills | Status: DC
Start: 2023-01-23 — End: 2023-04-23

## 2023-02-09 ENCOUNTER — Other Ambulatory Visit: Payer: Self-pay | Admitting: Primary Care

## 2023-02-13 ENCOUNTER — Other Ambulatory Visit: Payer: Self-pay | Admitting: Primary Care

## 2023-02-13 MED ORDER — HYDROCODONE-ACETAMINOPHEN 5-325 MG PO TABS *I*
1.0000 | ORAL_TABLET | Freq: Four times a day (QID) | ORAL | 0 refills | Status: DC | PRN
Start: 2023-02-13 — End: 2023-03-19

## 2023-02-13 NOTE — Telephone Encounter (Signed)
Last dispensed 3/24 x 7 day

## 2023-03-13 ENCOUNTER — Encounter: Payer: Self-pay | Admitting: Primary Care

## 2023-03-13 ENCOUNTER — Telehealth: Payer: Self-pay | Admitting: Primary Care

## 2023-03-13 NOTE — Telephone Encounter (Signed)
Patients mother is very upset that she has not received a call back.   Please advise

## 2023-03-13 NOTE — Telephone Encounter (Signed)
Patients mother is calling again, requesting a call back from the [place] PM today.

## 2023-03-13 NOTE — Telephone Encounter (Signed)
This is a patient of Dr. Benjaman Kindler.  I am confused as to why this mother is requesting to talk with Dr. Melvia Heaps nurse

## 2023-03-13 NOTE — Telephone Encounter (Signed)
Patient's mom calling, states that she is having a difficult time getting records sent to [employer].     States that she was told by a nurse that the records would be expedited and this has not happened.     She states that fax has been sent 3 times. She is worried patient is going to lose benefits if this is not sent in today.     She is asking to speak with Tammy, PM.

## 2023-03-13 NOTE — Telephone Encounter (Addendum)
Patients mother is calling  requesting a call back on this number  [phone]        She would also like to speak with Dr. nurse

## 2023-03-13 NOTE — Telephone Encounter (Signed)
Call placed to number.  Line is busy.  Will attempt call back

## 2023-03-14 NOTE — Telephone Encounter (Signed)
My apologies  [phone]

## 2023-03-14 NOTE — Telephone Encounter (Signed)
Patients mother called again today, stating she still has not been called back. Explained to her that the PM is going to call her back.

## 2023-03-14 NOTE — Telephone Encounter (Signed)
Please see telephone encounter in regards to this.

## 2023-03-19 ENCOUNTER — Other Ambulatory Visit: Payer: Self-pay | Admitting: Primary Care

## 2023-03-19 ENCOUNTER — Other Ambulatory Visit: Payer: Self-pay | Admitting: Cardiology

## 2023-03-19 MED ORDER — ESOMEPRAZOLE MAGNESIUM 40 MG PO CPDR *I*
40.0000 mg | DELAYED_RELEASE_CAPSULE | Freq: Every day | ORAL | 2 refills | Status: DC
Start: 2023-03-19 — End: 2023-05-21

## 2023-03-19 MED ORDER — CYCLOBENZAPRINE HCL 5 MG PO TABS *I*
5.0000 mg | ORAL_TABLET | Freq: Three times a day (TID) | ORAL | 2 refills | Status: DC | PRN
Start: 2023-03-19 — End: 2023-05-21

## 2023-03-19 MED ORDER — GABAPENTIN 600 MG PO TABLET *I*
600.0000 mg | ORAL_TABLET | Freq: Three times a day (TID) | ORAL | 2 refills | Status: DC
Start: 2023-03-19 — End: 2024-04-21

## 2023-03-20 MED ORDER — METOPROLOL SUCCINATE 25 MG PO TB24 *I*
25.0000 mg | ORAL_TABLET | Freq: Every day | ORAL | 3 refills | Status: DC
Start: 2023-03-20 — End: 2023-10-09

## 2023-03-20 MED ORDER — HYDROCODONE-ACETAMINOPHEN 5-325 MG PO TABS *I*
1.0000 | ORAL_TABLET | Freq: Four times a day (QID) | ORAL | 0 refills | Status: DC | PRN
Start: 2023-03-20 — End: 2023-05-21

## 2023-04-03 ENCOUNTER — Ambulatory Visit: Payer: Medicare Other | Admitting: Primary Care

## 2023-04-22 ENCOUNTER — Other Ambulatory Visit: Payer: Self-pay | Admitting: Primary Care

## 2023-04-22 DIAGNOSIS — F32A Depression, unspecified: Secondary | ICD-10-CM

## 2023-05-21 ENCOUNTER — Other Ambulatory Visit: Payer: Self-pay | Admitting: Primary Care

## 2023-05-21 ENCOUNTER — Telehealth: Payer: Self-pay | Admitting: Primary Care

## 2023-05-21 DIAGNOSIS — E785 Hyperlipidemia, unspecified: Secondary | ICD-10-CM

## 2023-05-21 DIAGNOSIS — F32A Depression, unspecified: Secondary | ICD-10-CM

## 2023-05-21 DIAGNOSIS — R5383 Other fatigue: Secondary | ICD-10-CM

## 2023-05-21 MED ORDER — ESOMEPRAZOLE MAGNESIUM 40 MG PO CPDR *I*
40.0000 mg | DELAYED_RELEASE_CAPSULE | Freq: Every day | ORAL | 2 refills | Status: DC
Start: 2023-05-21 — End: 2024-03-18

## 2023-05-21 MED ORDER — SILDENAFIL CITRATE 100 MG PO TABS *I*
100.0000 mg | ORAL_TABLET | ORAL | 2 refills | Status: DC | PRN
Start: 2023-05-21 — End: 2023-08-20

## 2023-05-21 MED ORDER — HYDROCODONE-ACETAMINOPHEN 5-325 MG PO TABS *I*
1.0000 | ORAL_TABLET | Freq: Four times a day (QID) | ORAL | 0 refills | Status: DC | PRN
Start: 2023-05-21 — End: 2023-08-13

## 2023-05-21 MED ORDER — CYCLOBENZAPRINE HCL 5 MG PO TABS *I*
5.0000 mg | ORAL_TABLET | Freq: Three times a day (TID) | ORAL | 2 refills | Status: DC | PRN
Start: 2023-05-21 — End: 2023-09-29

## 2023-05-21 MED ORDER — FLUOXETINE HCL 20 MG PO CAPS *I*
20.0000 mg | ORAL_CAPSULE | Freq: Every day | ORAL | 0 refills | Status: DC
Start: 2023-05-21 — End: 2023-05-29

## 2023-05-21 NOTE — Telephone Encounter (Signed)
Left message for patient to call me back at my extension

## 2023-05-21 NOTE — Telephone Encounter (Signed)
Please ask him to follow-up with me in the near future.  Please ask him to get blood work done a day or 2 before his appointment

## 2023-05-21 NOTE — Telephone Encounter (Signed)
Last disp date 02/16/2023 28 tabs for 7 days

## 2023-05-21 NOTE — Telephone Encounter (Signed)
methylphenidate (RITALIN)     Patient is calling requesting a refill on this medication sent to [facility] pharmacy

## 2023-05-21 NOTE — Telephone Encounter (Signed)
This has been scheduled

## 2023-05-21 NOTE — Telephone Encounter (Signed)
I am not seeing methylphenidate on patient's current medication list. It is in his history

## 2023-05-21 NOTE — Telephone Encounter (Signed)
Please call patient and schedule him an appointment with Dr.Ashdown to discuss

## 2023-05-21 NOTE — Telephone Encounter (Signed)
Patient called back.     He states he stopped taking this when dr was trying to figure out why his blood pressure was elevated.   He has since figured out that it does not affect his blood pressure. He has noticed he is way more tired when he doesn't take this medication and finds it is the only thing that helps. He is open to trying something different if doctor does not want him on this anymore.

## 2023-05-23 ENCOUNTER — Other Ambulatory Visit
Admission: RE | Admit: 2023-05-23 | Discharge: 2023-05-23 | Disposition: A | Discharge status: Home / Self Care | Payer: Medicare Other | Source: Ambulatory Visit | Attending: Primary Care | Admitting: Primary Care

## 2023-05-23 ENCOUNTER — Other Ambulatory Visit: Payer: Self-pay | Admitting: Primary Care

## 2023-05-23 DIAGNOSIS — R5383 Other fatigue: Secondary | ICD-10-CM | POA: Insufficient documentation

## 2023-05-23 DIAGNOSIS — E785 Hyperlipidemia, unspecified: Secondary | ICD-10-CM | POA: Insufficient documentation

## 2023-05-23 LAB — TSH: TSH: 4.84 u[IU]/mL — ABNORMAL HIGH (ref 0.27–4.20)

## 2023-05-23 LAB — LIPID PANEL
Chol/HDL Ratio: 9.3
Cholesterol: 316 mg/dL — AB
HDL: 34 mg/dL — ABNORMAL LOW (ref 40–60)
Non HDL Cholesterol: 282 mg/dL
Triglycerides: 1017 mg/dL — AB

## 2023-05-23 LAB — COMPREHENSIVE METABOLIC PANEL
ALT: 40 U/L (ref 0–50)
AST: 32 U/L (ref 0–50)
Albumin: 4.9 g/dL (ref 3.5–5.2)
Alk Phos: 62 U/L (ref 40–130)
Anion Gap: 11 (ref 7–16)
Bilirubin,Total: 1.4 mg/dL — ABNORMAL HIGH (ref 0.0–1.2)
CO2: 29 mmol/L — ABNORMAL HIGH (ref 20–28)
Calcium: 10.3 mg/dL — ABNORMAL HIGH (ref 8.6–10.2)
Chloride: 98 mmol/L (ref 96–108)
Creatinine: 1.25 mg/dL — ABNORMAL HIGH (ref 0.67–1.17)
Glucose: 97 mg/dL (ref 60–99)
Lab: 13 mg/dL (ref 6–20)
Potassium: 4.5 mmol/L (ref 3.3–4.6)
Sodium: 138 mmol/L (ref 133–145)
Total Protein: 7.5 g/dL (ref 6.3–7.7)
eGFR BY CREAT: 70 *

## 2023-05-23 LAB — CBC
Hematocrit: 44 % (ref 37–52)
Hemoglobin: 15.4 g/dL (ref 12.0–17.0)
MCV: 89 fL (ref 75–100)
Platelets: 266 10*3/uL (ref 150–450)
RBC: 4.9 MIL/uL (ref 4.0–6.0)
RDW: 11.9 % (ref 0.0–15.0)
WBC: 5.7 10*3/uL (ref 3.5–11.0)

## 2023-05-23 LAB — LDL CHOLESTEROL, DIRECT: LDL Direct: 77 mg/dL

## 2023-05-23 LAB — T4, FREE: Free T4: 1 ng/dL (ref 0.9–1.7)

## 2023-05-24 ENCOUNTER — Telehealth: Payer: Self-pay | Admitting: Primary Care

## 2023-05-24 MED ORDER — ATORVASTATIN CALCIUM 20 MG PO TABS *I*
20.0000 mg | ORAL_TABLET | Freq: Every day | ORAL | 3 refills | Status: DC
Start: 2023-05-24 — End: 2023-10-09

## 2023-05-24 NOTE — Telephone Encounter (Signed)
Patient is aware.   States that he is taking the atorvastatin.   Is aware to come in for his appt.

## 2023-05-24 NOTE — Telephone Encounter (Signed)
Triglycerides are extremely high.  Is he taking the atorvastatin?  Keep appointment as per schedule

## 2023-05-29 ENCOUNTER — Ambulatory Visit: Payer: Medicare Other | Attending: Primary Care | Admitting: Primary Care

## 2023-05-29 ENCOUNTER — Encounter: Payer: Self-pay | Admitting: Primary Care

## 2023-05-29 ENCOUNTER — Telehealth: Payer: Self-pay | Admitting: Primary Care

## 2023-05-29 ENCOUNTER — Other Ambulatory Visit: Payer: Self-pay

## 2023-05-29 VITALS — BP 140/95 | HR 68 | Temp 97.1°F | Ht 70.0 in | Wt 191.2 lb

## 2023-05-29 DIAGNOSIS — R0989 Other specified symptoms and signs involving the circulatory and respiratory systems: Secondary | ICD-10-CM | POA: Insufficient documentation

## 2023-05-29 DIAGNOSIS — R7989 Other specified abnormal findings of blood chemistry: Secondary | ICD-10-CM | POA: Insufficient documentation

## 2023-05-29 DIAGNOSIS — E785 Hyperlipidemia, unspecified: Secondary | ICD-10-CM | POA: Insufficient documentation

## 2023-05-29 MED ORDER — LEVOTHYROXINE SODIUM 25 MCG PO TABS *I*
25.0000 ug | ORAL_TABLET | Freq: Every day | ORAL | 2 refills | Status: DC
Start: 2023-05-29 — End: 2023-09-24

## 2023-05-29 MED ORDER — FLUOXETINE HCL 40 MG PO CAPS *I*
40.0000 mg | ORAL_CAPSULE | Freq: Every day | ORAL | 2 refills | Status: DC
Start: 2023-05-29 — End: 2023-09-03

## 2023-05-29 NOTE — Patient Instructions (Signed)
Please get blood work done a couple of days before next visit

## 2023-05-29 NOTE — Telephone Encounter (Signed)
Patient did not stop at check out       Follow up in about 6 weeks (around 07/10/2023)

## 2023-05-29 NOTE — Progress Notes (Signed)
Patient ID: James Oneill is a 47 y.o. year old male.    Chief Complaint   Patient presents with    Follow-up     followup     HPI:  James Oneill is here today for reevaluation of his hypertension hyperlipidemia depression  No reports of chest pain palpitations dyspnea orthopnea PND  He reports increasing fatigue.  Weight gain.  Feels that his depression is getting worse.  He lives by himself.  He does not want to be around other people.  He does not feel motivated to do things.  No homicidal suicidal ideation  He does have a dog which keeps him companionship.  He has had a couple of failed relationships over the last couple of years.  He reports being lightheaded in the evenings.  Systolic blood pressures can go down into the 90s.  Currently takes his antihypertensives in the morning  Lab work below was reviewed with him      The medication and allergy list was reviewed and is accurate  Allergy / Social History / Medications:  Allergies   Allergen Reactions    Shellfish-Derived Products Hives     Social History     Tobacco Use    Smoking status: Never    Smokeless tobacco: Never   Substance Use Topics    Alcohol use: Yes     Alcohol/week: 1.0 standard drink of alcohol     Types: 1 Glasses of wine per week     Patient's Medications   New Prescriptions    FLUOXETINE (PROZAC) 40 MG CAPSULE    Take 1 capsule (40 mg total) by mouth daily.    LEVOTHYROXINE (SYNTHROID, LEVOTHROID) 25 MCG TABLET    Take 1 tablet (25 mcg total) by mouth daily (before breakfast).   Previous Medications    ATORVASTATIN (LIPITOR) 20 MG TABLET    Take 1 tablet (20 mg total) by mouth daily.    CHOLESTYRAMINE (QUESTRAN) 4 G PACKET    Take 4 g by mouth 2 times daily (with meals)    CYCLOBENZAPRINE (FLEXERIL) 5 MG TABLET    Take 1 tablet (5 mg total) by mouth 3 times daily as needed for Muscle spasms.    ESOMEPRAZOLE MAGNESIUM (NEXIUM) 40 MG CAPSULE    Take 1 capsule (40 mg total) by mouth daily (before breakfast).    FLUTICASONE (FLONASE) 50  MCG/ACT NASAL SPRAY    Spray 1 spray into nostril daily    GABAPENTIN 600 MG TABLET    Take 1 tablet (600 mg total) by mouth 3 times daily.    HYDROCODONE-ACETAMINOPHEN (NORCO) 5-325 MG PER TABLET    Take 1 tablet by mouth every 6 hours as needed for Pain. Max daily dose: 4 tablets    LOSARTAN (COZAAR) 50 MG TABLET    Take 1 tablet (50 mg total) by mouth daily    METOPROLOL SUCCINATE ER (TOPROL-XL) 25 MG 24 HR TABLET    Take 1 tablet (25 mg total) by mouth daily. Do not crush or chew. May be divided.    MUPIROCIN (BACTROBAN) 2 % OINTMENT    Apply topically 3 times daily  for nasal vestibulitis to the following a2reas: nasal vestibule    POLYETHYLENE GLYCOL (GLYCOLAX) 17 GM/SCOOP POWDER    Take 17 g by mouth daily  Mix in 8 oz water or juice and drink.    SILDENAFIL (VIAGRA) 100 MG TABLET    Take 1 tablet (100 mg total) by mouth as needed for Erectile Dysfunction. Take  at least 30 minutes prior to sexual activity.   Modified Medications    No medications on file   Discontinued Medications    FLUOXETINE (PROZAC) 20 MG CAPSULE    Take 1 capsule (20 mg total) by mouth daily.          Physical Exam:  Vitals:    05/29/23 1406   BP: (!) 140/95   Pulse: 68   Temp: 36.2 C (97.1 F)   Weight: 86.7 kg (191 lb 3.2 oz)   Height: 1.778 m (5\' 10" )     SpO2 Readings from Last 3 Encounters:   05/29/23 98%   01/01/23 99%   10/23/22 97%      Estimated body mass index is 27.43 kg/m as calculated from the following:    Height as of this encounter: 1.778 m (5\' 10" ).    Weight as of this encounter: 86.7 kg (191 lb 3.2 oz).  BP Readings from Last 3 Encounters:   05/29/23 (!) 140/95   01/01/23 120/80   10/23/22 142/78     Wt Readings from Last 3 Encounters:   05/29/23 86.7 kg (191 lb 3.2 oz)   01/01/23 84.4 kg (186 lb 1.6 oz)   08/23/22 79.4 kg (175 lb)     Looks well.  Not in any acute distress  Neck-supple.  Induration left side of neck no adenopathy or thyromegaly  Cardiovascular-heart rate regular.  Heart sounds 1 and 2 with no added  sounds no carotid bruits no edema  Respiratory-normal respiratory effort.  Good bilateral air entry.  Lungs are clear to palpation percussion and auscultation    Recent Lab Results:   Latest Reference Range & Units 05/23/23 09:33   Sodium 133 - 145 mmol/L 138   Potassium 3.3 - 4.6 mmol/L 4.5   Chloride 96 - 108 mmol/L 98   CO2 20 - 28 mmol/L 29 (H)   Anion Gap 7 - 16  11   UN 6 - 20 mg/dL 13   Creatinine 1.61 - 1.17 mg/dL 0.96 (H)   eGFR BY CREAT * 70   Glucose 60 - 99 mg/dL 97   Calcium 8.6 - 04.5 mg/dL 40.9 (H)   Total Protein 6.3 - 7.7 g/dL 7.5   Albumin 3.5 - 5.2 g/dL 4.9   ALT 0 - 50 U/L 40   AST 0 - 50 U/L 32   Alk Phos 40 - 130 U/L 62   Bilirubin,Total 0.0 - 1.2 mg/dL 1.4 (H)   Cholesterol mg/dL 811 !   Triglycerides mg/dL 9,147 !   HDL Cholesterol 40 - 60 mg/dL 34 (L)   LDL Calculated mg/dL see below   LDL Direct mg/dL 77   Non HDL Cholesterol mg/dL 829   Chol/HDL Ratio  9.3   TSH 0.27 - 4.20 uIU/mL 4.84 (H)   Free T4 0.9 - 1.7 ng/dL 1.0   WBC 3.5 - 56.2 THOU/uL 5.7   RBC 4.0 - 6.0 MIL/uL 4.9   Hemoglobin 12.0 - 17.0 g/dL 13.0   Hematocrit 37 - 52 % 44   MCV 75 - 100 fL 89   (H): Data is abnormally high  !: Data is abnormal  (L): Data is abnormally low    Assessment and Plan:     1.  Hypertension.  Blood pressure at goal.  Recommend he takes his antihypertensives in the evening  2.  Hyperlipidemia.  3.  Elevated TSH.  Low normal T4  4.  Depression      Recommend starting levothyroxine  in view of elevated TSH and hypertriglyceridemia  Increase fluoxetine to 40 mg    Follow-up in 6 weeks with lab work  Orders this visit  Orders Placed This Encounter   Procedures    Comprehensive metabolic panel    Lipid Panel (Reflex to Direct  LDL if Triglycerides more than 400)    TSH    T4, free

## 2023-06-20 ENCOUNTER — Telehealth: Payer: Self-pay | Admitting: Primary Care

## 2023-06-20 NOTE — Telephone Encounter (Signed)
Esomeprazole Magnesium 40MG  dr capsules were approved through 9.4.25

## 2023-06-20 NOTE — Telephone Encounter (Signed)
MEDICATION PRIOR AUTH REQUIRED    PA KEY: [id number]    MEDICATION: Esomeprazole Magnesium    DOSAGE: 40MG  DR     IS THIS A NEW DOSAGE?: (yes or no):       CLINICAL INDICATION FOR MEDICATION (diagnosis):

## 2023-06-24 ENCOUNTER — Other Ambulatory Visit: Payer: Self-pay

## 2023-06-26 ENCOUNTER — Ambulatory Visit: Payer: Medicare Other | Admitting: Otolaryngology

## 2023-07-02 ENCOUNTER — Telehealth: Payer: Self-pay

## 2023-07-10 ENCOUNTER — Ambulatory Visit: Payer: Medicare Other | Admitting: Primary Care

## 2023-07-10 ENCOUNTER — Encounter: Payer: Self-pay | Admitting: Primary Care

## 2023-08-09 ENCOUNTER — Other Ambulatory Visit: Payer: Self-pay | Admitting: Cardiology

## 2023-08-09 DIAGNOSIS — I10 Essential (primary) hypertension: Secondary | ICD-10-CM

## 2023-08-09 DIAGNOSIS — E785 Hyperlipidemia, unspecified: Secondary | ICD-10-CM

## 2023-08-09 NOTE — Telephone Encounter (Signed)
 Refill request for continuity patient without recent visit:    90 day supply with 0 refills pended, task sent to schedulers for non-urgent revisit pending availability.    NOTE: Reply to this message if you recommend a different action.

## 2023-08-10 ENCOUNTER — Telehealth: Payer: Self-pay

## 2023-08-10 NOTE — Telephone Encounter (Signed)
Attempting to reschedule missed appt with Dr. Hyacinth Meeker. Mailbox full and unable to leave VM.     Will attempt MyChart message.    Brandon Melnick, RN

## 2023-08-13 ENCOUNTER — Other Ambulatory Visit: Payer: Self-pay | Admitting: Otolaryngology

## 2023-08-13 ENCOUNTER — Other Ambulatory Visit: Payer: Self-pay | Admitting: Primary Care

## 2023-08-13 ENCOUNTER — Encounter: Payer: Self-pay | Admitting: Primary Care

## 2023-08-13 MED ORDER — MUPIROCIN 2 % EX OINT *I*
TOPICAL_OINTMENT | Freq: Three times a day (TID) | CUTANEOUS | 3 refills | Status: DC
Start: 2023-08-13 — End: 2023-12-17

## 2023-08-14 MED ORDER — HYDROCODONE-ACETAMINOPHEN 5-325 MG PO TABS *I*
1.0000 | ORAL_TABLET | Freq: Four times a day (QID) | ORAL | 0 refills | Status: DC | PRN
Start: 2023-08-14 — End: 2023-09-03

## 2023-08-14 NOTE — Telephone Encounter (Signed)
Last fill 05/23/23 for 7 day supply

## 2023-08-20 ENCOUNTER — Other Ambulatory Visit: Payer: Self-pay

## 2023-08-20 ENCOUNTER — Other Ambulatory Visit
Admission: RE | Admit: 2023-08-20 | Discharge: 2023-08-20 | Disposition: A | Discharge status: Home / Self Care | Payer: Medicare Other | Source: Ambulatory Visit | Attending: Primary Care | Admitting: Primary Care

## 2023-08-20 ENCOUNTER — Encounter: Payer: Self-pay | Admitting: Primary Care

## 2023-08-20 ENCOUNTER — Ambulatory Visit: Payer: Medicare Other | Attending: Primary Care | Admitting: Primary Care

## 2023-08-20 VITALS — BP 130/90 | HR 68 | Temp 97.3°F | Ht 70.0 in | Wt 188.1 lb

## 2023-08-20 DIAGNOSIS — Z23 Encounter for immunization: Secondary | ICD-10-CM | POA: Insufficient documentation

## 2023-08-20 DIAGNOSIS — R7989 Other specified abnormal findings of blood chemistry: Secondary | ICD-10-CM | POA: Insufficient documentation

## 2023-08-20 DIAGNOSIS — R0989 Other specified symptoms and signs involving the circulatory and respiratory systems: Secondary | ICD-10-CM | POA: Insufficient documentation

## 2023-08-20 DIAGNOSIS — E785 Hyperlipidemia, unspecified: Secondary | ICD-10-CM | POA: Insufficient documentation

## 2023-08-20 LAB — COMPREHENSIVE METABOLIC PANEL
ALT: 51 U/L — ABNORMAL HIGH (ref 0–50)
AST: 49 U/L (ref 0–50)
Albumin: 4.8 g/dL (ref 3.5–5.2)
Alk Phos: 57 U/L (ref 40–130)
Anion Gap: 11 (ref 7–16)
Bilirubin,Total: 1.6 mg/dL — ABNORMAL HIGH (ref 0.0–1.2)
CO2: 29 mmol/L — ABNORMAL HIGH (ref 20–28)
Calcium: 10.1 mg/dL (ref 8.6–10.2)
Chloride: 100 mmol/L (ref 96–108)
Creatinine: 1.28 mg/dL — ABNORMAL HIGH (ref 0.67–1.17)
Glucose: 99 mg/dL (ref 60–99)
Lab: 10 mg/dL (ref 6–20)
Potassium: 4.5 mmol/L (ref 3.3–4.6)
Sodium: 140 mmol/L (ref 133–145)
Total Protein: 7.1 g/dL (ref 6.3–7.7)
eGFR BY CREAT: 68 *

## 2023-08-20 LAB — LIPID PANEL
Chol/HDL Ratio: 4.6
Cholesterol: 182 mg/dL
HDL: 40 mg/dL (ref 40–60)
Non HDL Cholesterol: 142 mg/dL
Triglycerides: 429 mg/dL — AB

## 2023-08-20 LAB — LDL CHOLESTEROL, DIRECT: LDL Direct: 54 mg/dL

## 2023-08-20 LAB — TSH: TSH: 2.16 u[IU]/mL (ref 0.27–4.20)

## 2023-08-20 LAB — T4, FREE: Free T4: 1.1 ng/dL (ref 0.9–1.7)

## 2023-08-20 MED ORDER — DOXYCYCLINE HYCLATE 100 MG PO TABS *I*
100.0000 mg | ORAL_TABLET | Freq: Two times a day (BID) | ORAL | 0 refills | Status: DC
Start: 2023-08-20 — End: 2023-10-05

## 2023-08-20 NOTE — Progress Notes (Signed)
Patient ID: James Oneill is a 47 y.o. year old male.    Chief Complaint   Patient presents with    Follow-up     FUV     HPI:  James Oneill is here today for reevaluation of his hypertension hyperlipidemia hypothyroidism  No reports of chest pain palpitations dyspnea orthopnea PND  He does get lightheaded if he rises from lying or sitting too quickly.  Sometimes this can be quite severe.  He has another outbreak of papules just under his left nares which started 6 days ago.  The area remains sore.  He has been applying mupirocin ointment.  Available lab work below was reviewed with him.  He reports that he still has fatigue despite taking levothyroxine      The medication and allergy list was reviewed and is accurate  Allergy / Social History / Medications:  Allergies   Allergen Reactions    Shellfish-Derived Products Hives     Social History     Tobacco Use    Smoking status: Never    Smokeless tobacco: Never   Substance Use Topics    Alcohol use: Yes     Alcohol/week: 1.0 standard drink of alcohol     Types: 1 Glasses of wine per week     Patient's Medications   New Prescriptions    DOXYCYCLINE HYCLATE (VIBRA-TABS) 100 MG TABLET    Take 1 tablet (100 mg total) by mouth 2 times daily for Infection of the Skin and/or Soft Tissue.   Previous Medications    ATORVASTATIN (LIPITOR) 20 MG TABLET    Take 1 tablet (20 mg total) by mouth daily.    CYCLOBENZAPRINE (FLEXERIL) 5 MG TABLET    Take 1 tablet (5 mg total) by mouth 3 times daily as needed for Muscle spasms.    ESOMEPRAZOLE MAGNESIUM (NEXIUM) 40 MG CAPSULE    Take 1 capsule (40 mg total) by mouth daily (before breakfast).    FLUOXETINE (PROZAC) 40 MG CAPSULE    Take 1 capsule (40 mg total) by mouth daily.    FLUTICASONE (FLONASE) 50 MCG/ACT NASAL SPRAY    Spray 1 spray into nostril daily    GABAPENTIN 600 MG TABLET    Take 1 tablet (600 mg total) by mouth 3 times daily.    HYDROCODONE-ACETAMINOPHEN (NORCO) 5-325 MG PER TABLET    Take 1 tablet by mouth every 6 hours  as needed for Pain. Max daily dose: 4 tablets    LEVOTHYROXINE (SYNTHROID, LEVOTHROID) 25 MCG TABLET    Take 1 tablet (25 mcg total) by mouth daily (before breakfast).    LOSARTAN (COZAAR) 50 MG TABLET    TAKE 1 TABLET BY MOUTH EVERY DAY    METOPROLOL SUCCINATE ER (TOPROL-XL) 25 MG 24 HR TABLET    Take 1 tablet (25 mg total) by mouth daily. Do not crush or chew. May be divided.    MUPIROCIN (BACTROBAN) 2 % OINTMENT    Apply topically 3 times daily for nasal vestibulitis. to the following a2reas: nasal vestibule   Modified Medications    No medications on file   Discontinued Medications    ATORVASTATIN (LIPITOR) 10 MG TABLET    TAKE 1 TABLET BY MOUTH EVERY DAY    CHOLESTYRAMINE (QUESTRAN) 4 G PACKET    Take 4 g by mouth 2 times daily (with meals)    POLYETHYLENE GLYCOL (GLYCOLAX) 17 GM/SCOOP POWDER    Take 17 g by mouth daily  Mix in 8 oz water or juice  and drink.    SILDENAFIL (VIAGRA) 100 MG TABLET    Take 1 tablet (100 mg total) by mouth as needed for Erectile Dysfunction. Take at least 30 minutes prior to sexual activity.          Physical Exam:  Vitals:    08/20/23 1241   BP: 130/90   Pulse:    Temp:    Weight:    Height:      SpO2 Readings from Last 3 Encounters:   08/20/23 97%   05/29/23 98%   01/01/23 99%      Estimated body mass index is 26.99 kg/m as calculated from the following:    Height as of this encounter: 1.778 m (5\' 10" ).    Weight as of this encounter: 85.3 kg (188 lb 2 oz).  BP Readings from Last 3 Encounters:   08/20/23 130/90   05/29/23 (!) 140/95   01/01/23 120/80     Wt Readings from Last 3 Encounters:   08/20/23 85.3 kg (188 lb 2 oz)   05/29/23 86.7 kg (191 lb 3.2 oz)   01/01/23 84.4 kg (186 lb 1.6 oz)     Looks well.  Not in any acute distress  James Oneill has a few papules under the left nares  Neck-supple no adenopathy or thyromegaly.  Induration left side of the neck  Cardiovascular-heart rate regular.  Heart sounds 1 and 2 with no added sounds no carotid bruits no edema  Respiratory-normal  respiratory effort.  Good bilateral air entry.  Lungs are clear to palpation percussion and auscultation    Recent Lab Results:   Latest Reference Range & Units 05/23/23 09:33 08/20/23 10:00   TSH 0.27 - 4.20 uIU/mL 4.84 (H) 2.16   Free T4 0.9 - 1.7 ng/dL 1.0 1.1   (H): Data is abnormally high      Assessment and Plan:     1.  Labile hypertension.  Symptoms of orthostatic hypotension.  This was discussed with him.  Recommend he takes metoprolol at bedtime.  Make sure that he takes his time rising from lying or sitting.  He should hold onto something for him if he feels dizzy  2.  Hyperlipidemia  3.  Hypothyroidism.  Continue current dose of levothyroxine  4.  Localized skin infection versus herpes simplex.  He is under the treatment window for treatment with an antiviral.  Will treat with doxycycline.  Reevaluate if does not improve      Continue current medications low-fat low-salt diet    Influenza vaccination today  Orders this visit  Orders Placed This Encounter   Procedures    Influenza Trivalent PF 45mo-45yr    Lipid Panel (Reflex to Direct  LDL if Triglycerides more than 400)    Comprehensive metabolic panel    TSH    T4, free

## 2023-08-21 ENCOUNTER — Telehealth: Payer: Self-pay | Admitting: Primary Care

## 2023-08-21 DIAGNOSIS — E785 Hyperlipidemia, unspecified: Secondary | ICD-10-CM

## 2023-08-21 MED ORDER — FENOFIBRATE 145 MG PO TABS *I*
145.0000 mg | ORAL_TABLET | Freq: Every day | ORAL | 1 refills | Status: DC
Start: 2023-08-21 — End: 2023-10-09

## 2023-08-21 NOTE — Telephone Encounter (Signed)
Please let him know that his triglycerides are quite high.  In addition to him taking atorvastatin I want him to start taking fenofibrate  Have sent prescription to the pharmacy for him.  Please follow-up in 3 months but get blood work done a couple of days before his appointment

## 2023-08-22 ENCOUNTER — Telehealth: Payer: Self-pay

## 2023-08-22 ENCOUNTER — Telehealth: Payer: Self-pay | Admitting: Otolaryngology

## 2023-08-22 NOTE — Telephone Encounter (Signed)
writer returned call to pt. pt confirmed new appt on 11/19 @ 10:30 am

## 2023-09-03 ENCOUNTER — Other Ambulatory Visit: Payer: Self-pay | Admitting: Primary Care

## 2023-09-03 ENCOUNTER — Other Ambulatory Visit: Payer: Self-pay | Admitting: Internal Medicine

## 2023-09-04 ENCOUNTER — Ambulatory Visit: Payer: Medicare Other | Attending: Otolaryngology | Admitting: Otolaryngology

## 2023-09-04 ENCOUNTER — Other Ambulatory Visit: Payer: Self-pay

## 2023-09-04 VITALS — BP 131/84 | HR 84 | Temp 97.2°F | Resp 16 | Ht 70.0 in | Wt 187.5 lb

## 2023-09-04 DIAGNOSIS — H9319 Tinnitus, unspecified ear: Secondary | ICD-10-CM | POA: Insufficient documentation

## 2023-09-04 DIAGNOSIS — J3489 Other specified disorders of nose and nasal sinuses: Secondary | ICD-10-CM | POA: Insufficient documentation

## 2023-09-04 DIAGNOSIS — C109 Malignant neoplasm of oropharynx, unspecified: Secondary | ICD-10-CM | POA: Insufficient documentation

## 2023-09-04 DIAGNOSIS — R1312 Dysphagia, oropharyngeal phase: Secondary | ICD-10-CM | POA: Insufficient documentation

## 2023-09-04 MED ORDER — HYDROCODONE-ACETAMINOPHEN 5-325 MG PO TABS *I*
1.0000 | ORAL_TABLET | Freq: Four times a day (QID) | ORAL | 0 refills | Status: DC | PRN
Start: 2023-09-04 — End: 2023-09-29

## 2023-09-04 MED ORDER — FLUOXETINE HCL 40 MG PO CAPS *I*
40.0000 mg | ORAL_CAPSULE | Freq: Every day | ORAL | 2 refills | Status: DC
Start: 2023-09-04 — End: 2023-09-29

## 2023-09-04 NOTE — Progress Notes (Signed)
.  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 Since his last visit with me he has been doing relatively well. He has not noticed any new masses or swelling in the head and neck. There has been no odynophagia, otalgia, or hemoptysis. There have been no significant voice changes. He has been having mild dysphagia to both solids and liquids. His weight has been stable. James Oneill has had no fevers, chills, or sweats.  He does report tinnitus which has become progressive over the past year. He also notes left  neck spasms for which he is taking cyclobenzaprine. He did recently start taking synthroid. He did have a recent bout of nasal vestibulitis on the left. He is completing a course of doxycycline.         A complete and comprehensive otolaryngologic examination including the ears, nose, mouth, throat, and neck was performed.   Blood pressure 131/84, pulse 84, temperature 36.2 C (97.2 F), temperature source Temporal, resp. rate 16, height 1.778 m (5\' 10" ), weight 85 kg (187 lb 8 oz), SpO2 98%.   Examination of the neck reveals postoperative radiation changes. There is no adenopathy. Normal tracheal and laryngeal crepitus is intact. The ears are clear.   The oral cavity, oropharynx, and nasal cavity are unremarkable. Due to his active gag reflex, mirror examination was inadequate. Flexible laryngoscopy revealed no evidence of recurrent disease or other abnormalities.      The remainder of my examination revealed no significant abnormalities     James Oneill is doing well. He is now five years removed from definitive therapy and has reached the point of statistical cure. Moving forward we will see him annually for second primary and late effect surveillance. Obviously I would be more than happy to see him in the interim should he develop any new, progressive, or recurrent symptoms that require my attention.      I have ordered a pharyngogram and audiogram. I did  offer referral to PM&R for his spasms but he declined for the time being.

## 2023-09-23 ENCOUNTER — Other Ambulatory Visit: Payer: Self-pay | Admitting: Primary Care

## 2023-09-29 ENCOUNTER — Other Ambulatory Visit: Payer: Self-pay | Admitting: Primary Care

## 2023-10-01 MED ORDER — CYCLOBENZAPRINE HCL 5 MG PO TABS *I*
5.0000 mg | ORAL_TABLET | Freq: Three times a day (TID) | ORAL | 2 refills | Status: DC | PRN
Start: 2023-10-01 — End: 2023-10-20

## 2023-10-01 MED ORDER — HYDROCODONE-ACETAMINOPHEN 5-325 MG PO TABS *I*
1.0000 | ORAL_TABLET | Freq: Four times a day (QID) | ORAL | 0 refills | Status: DC | PRN
Start: 2023-10-01 — End: 2023-11-30

## 2023-10-01 MED ORDER — FLUOXETINE HCL 40 MG PO CAPS *I*
40.0000 mg | ORAL_CAPSULE | Freq: Every day | ORAL | 2 refills | Status: DC
Start: 2023-10-01 — End: 2023-12-14

## 2023-10-05 ENCOUNTER — Emergency Department: Payer: Medicare Other

## 2023-10-05 ENCOUNTER — Encounter: Payer: Self-pay | Admitting: Primary Care

## 2023-10-05 ENCOUNTER — Ambulatory Visit: Payer: Medicare Other | Attending: Primary Care | Admitting: Primary Care

## 2023-10-05 ENCOUNTER — Emergency Department
Admission: EM | Admit: 2023-10-05 | Discharge: 2023-10-05 | Disposition: A | Discharge status: Home / Self Care | Payer: Medicare Other | Source: Ambulatory Visit | Attending: Emergency Medicine | Admitting: Emergency Medicine

## 2023-10-05 ENCOUNTER — Other Ambulatory Visit: Payer: Self-pay

## 2023-10-05 VITALS — BP 163/110 | HR 85 | Temp 96.4°F | Ht 70.0 in | Wt 188.8 lb

## 2023-10-05 DIAGNOSIS — G540 Brachial plexus disorders: Secondary | ICD-10-CM | POA: Insufficient documentation

## 2023-10-05 DIAGNOSIS — R519 Headache, unspecified: Secondary | ICD-10-CM

## 2023-10-05 DIAGNOSIS — I10 Essential (primary) hypertension: Secondary | ICD-10-CM | POA: Insufficient documentation

## 2023-10-05 DIAGNOSIS — I169 Hypertensive crisis, unspecified: Secondary | ICD-10-CM | POA: Insufficient documentation

## 2023-10-05 DIAGNOSIS — R55 Syncope and collapse: Secondary | ICD-10-CM | POA: Insufficient documentation

## 2023-10-05 DIAGNOSIS — R079 Chest pain, unspecified: Secondary | ICD-10-CM

## 2023-10-05 DIAGNOSIS — M542 Cervicalgia: Secondary | ICD-10-CM

## 2023-10-05 LAB — COMPREHENSIVE METABOLIC PANEL
ALT: 43 U/L (ref 0–50)
AST: 34 U/L (ref 0–50)
Albumin: 5.2 g/dL (ref 3.5–5.2)
Alk Phos: 47 U/L (ref 40–130)
Anion Gap: 8 (ref 7–16)
Bilirubin,Total: 1 mg/dL (ref 0.0–1.2)
CO2: 30 mmol/L — ABNORMAL HIGH (ref 20–28)
Calcium: 10.2 mg/dL (ref 8.6–10.2)
Chloride: 100 mmol/L (ref 96–108)
Creatinine: 1.32 mg/dL — ABNORMAL HIGH (ref 0.67–1.17)
Glucose: 97 mg/dL (ref 60–99)
Lab: 21 mg/dL — ABNORMAL HIGH (ref 6–20)
Potassium: 4.2 mmol/L (ref 3.3–4.6)
Sodium: 138 mmol/L (ref 133–145)
Total Protein: 7.7 g/dL (ref 6.3–7.7)
eGFR BY CREAT: 66 *

## 2023-10-05 LAB — NT-PRO BNP: NT-pro BNP: <50 pg/mL (ref 0–450)

## 2023-10-05 LAB — PROTIME-INR
INR: 1 (ref 0.9–1.1)
Protime: 11.3 s (ref 10.0–12.9)

## 2023-10-05 LAB — CBC AND DIFFERENTIAL
Baso # K/uL: 0 10*3/uL (ref 0.0–0.2)
Eos # K/uL: 0.1 10*3/uL (ref 0.0–0.5)
Hematocrit: 41 % (ref 37–52)
Hemoglobin: 14.6 g/dL (ref 12.0–17.0)
IMM Granulocytes #: 0 10*3/uL (ref 0.0–0.0)
IMM Granulocytes: 0.3 %
Lymph # K/uL: 1.6 10*3/uL (ref 1.0–5.0)
MCV: 88 fL (ref 75–100)
Mono # K/uL: 0.8 10*3/uL (ref 0.1–1.0)
Neut # K/uL: 3.4 10*3/uL (ref 1.5–6.5)
Nucl RBC # K/uL: 0 10*3/uL (ref 0.0–0.0)
Nucl RBC %: 0 /100{WBCs} (ref 0.0–0.2)
Platelets: 301 10*3/uL (ref 150–450)
RBC: 4.7 MIL/uL (ref 4.0–6.0)
RDW: 11.9 % (ref 0.0–15.0)
Seg Neut %: 58.4 %
WBC: 5.9 10*3/uL (ref 3.5–11.0)

## 2023-10-05 LAB — TSH: TSH: 3.05 u[IU]/mL (ref 0.27–4.20)

## 2023-10-05 LAB — DATE/TIME NOT PROVIDED

## 2023-10-05 LAB — TROPONIN T 1 HR W/ DELTA HIGH SENSITIVITY: TROP T 1 HR High Sensitivity: <6 ng/L (ref 0–11)

## 2023-10-05 LAB — T4, FREE: Free T4: 1.5 ng/dL (ref 0.9–1.7)

## 2023-10-05 LAB — TROPONIN T 0 HR HIGH SENSITIVITY (IP/ED ONLY): TROP T 0 HR High Sensitivity: <6 ng/L (ref 0–11)

## 2023-10-05 MED ORDER — IOHEXOL 350 MG/ML (OMNIPAQUE) IV SOLN 500ML BOTTLE *I*
1.0000 mL | Freq: Once | INTRAVENOUS | Status: AC
Start: 2023-10-05 — End: 2023-10-05
  Administered 2023-10-05: 70 mL via INTRAVENOUS

## 2023-10-05 MED ORDER — METOPROLOL SUCCINATE 25 MG PO TB24 *I*
50.0000 mg | ORAL_TABLET | Freq: Every day | ORAL | 0 refills | Status: DC
Start: 2023-10-05 — End: 2023-11-04

## 2023-10-05 NOTE — ED Triage Notes (Addendum)
Pt arrived from Dr. Haynes Hoehn office via wheelchair for headache, hypertension and blacking out episodes. Last black out episode was about a week ago. Pt reports fatigue. Chronic neck pain from radiation treatment for throat cancer. Was started on thyroid meds a few months ago.

## 2023-10-05 NOTE — ED Notes (Signed)
Patient discharged to home

## 2023-10-05 NOTE — Discharge Instructions (Addendum)
CT head, CTA head and neck were negative for any acute process  As discussed your lab work was unremarkable you do have some mild kidney dysfunction.  Recommend increase fluids and avoiding any NSAIDs.  Would recommend follow-up with your primary care doctor.  At this point I do recommend that you follow through with a stress echo.  We will go ahead and get this scheduled for you they will call to get you on the schedule most likely on Monday.  Return to the emergency room if you develop any chest pain or have any further syncopal events  Increase your metoprolol to 50 mg daily

## 2023-10-05 NOTE — Progress Notes (Addendum)
Patient ID: James Oneill is a 47 y.o. year old male.    Chief Complaint   Patient presents with    Follow-up     Eval fatique and pain     HPI: James Oneill is here this afternoon complaining of worsening pain in the left side of his neck and upper extremity.  Describes a burning electric shocklike pain.  It is unremitting.  Additionally he has been having some muscle spasm on the left side of his neck.  Gabapentin no longer helps.  He used to use fentanyl patch but did not like the way it made him feel sedated all the time  He tells me he has had about 6 episodes where he has blacked out.  In each occasion he has been standing up.  He has not had any preceding dizziness.  He is woken up on the floor.  No incontinence.  He is upset because he had a letter from [employer] saying that his Social Security disability was going to be discontinued on the basis of medical reports from his treating providers.    It was noted that he was profoundly hypertensive this afternoon.  He admits that he has been getting sharp stabbing pains in his frontal region.  He also reports he has had blurred vision for several months.  He he does not use any illicit substances    He has been evaluated by cardiologist.  It was recommended that he have a stress echo done.  To date he has not followed through with that recommendation        The medication and allergy list was reviewed and is accurate  Allergy / Social History / Medications:  Allergies   Allergen Reactions    Shellfish-Derived Products Hives     Social History     Tobacco Use    Smoking status: Never    Smokeless tobacco: Never   Substance Use Topics    Alcohol use: Yes     Alcohol/week: 1.0 standard drink of alcohol     Types: 1 Glasses of wine per week     Patient's Medications   New Prescriptions    No medications on file   Previous Medications    ATORVASTATIN (LIPITOR) 20 MG TABLET    Take 1 tablet (20 mg total) by mouth daily.    CYCLOBENZAPRINE (FLEXERIL) 5 MG TABLET     Take 1 tablet (5 mg total) by mouth 3 times daily as needed for Muscle spasms.    DOXYCYCLINE HYCLATE (VIBRA-TABS) 100 MG TABLET    Take 1 tablet (100 mg total) by mouth 2 times daily for Infection of the Skin and/or Soft Tissue.    ESOMEPRAZOLE MAGNESIUM (NEXIUM) 40 MG CAPSULE    Take 1 capsule (40 mg total) by mouth daily (before breakfast).    FENOFIBRATE (TRICOR) 145 MG TABLET    Take 1 tablet (145 mg total) by mouth daily.    FLUOXETINE (PROZAC) 40 MG CAPSULE    Take 1 capsule (40 mg total) by mouth daily.    FLUTICASONE (FLONASE) 50 MCG/ACT NASAL SPRAY    Spray 1 spray into nostril daily    GABAPENTIN 600 MG TABLET    Take 1 tablet (600 mg total) by mouth 3 times daily.    HYDROCODONE-ACETAMINOPHEN (NORCO) 5-325 MG PER TABLET    Take 1 tablet by mouth every 6 hours as needed for Pain. Max daily dose: 4 tablets    LEVOTHYROXINE (SYNTHROID, LEVOTHROID) 25 MCG TABLET  TAKE 1 TABLET BY MOUTH EVERY DAY BEFORE BREAKFAST    LOSARTAN (COZAAR) 50 MG TABLET    TAKE 1 TABLET BY MOUTH EVERY DAY    METOPROLOL SUCCINATE ER (TOPROL-XL) 25 MG 24 HR TABLET    Take 1 tablet (25 mg total) by mouth daily. Do not crush or chew. May be divided.    MUPIROCIN (BACTROBAN) 2 % OINTMENT    Apply topically 3 times daily for nasal vestibulitis. to the following a2reas: nasal vestibule   Modified Medications    No medications on file   Discontinued Medications    No medications on file          Physical Exam:  Vitals:    10/05/23 1344   BP: (!) 163/110   Pulse: 85   Temp: 35.8 C (96.4 F)   Weight: 85.6 kg (188 lb 12.8 oz)   Height: 1.778 m (5\' 10" )     SpO2 Readings from Last 3 Encounters:   10/05/23 99%   09/04/23 98%   08/20/23 97%      Estimated body mass index is 27.09 kg/m as calculated from the following:    Height as of this encounter: 1.778 m (5\' 10" ).    Weight as of this encounter: 85.6 kg (188 lb 12.8 oz).  BP Readings from Last 3 Encounters:   10/05/23 (!) 163/110   09/04/23 131/84   08/20/23 130/90     Wt Readings from  Last 3 Encounters:   10/05/23 85.6 kg (188 lb 12.8 oz)   09/04/23 85 kg (187 lb 8 oz)   08/20/23 85.3 kg (188 lb 2 oz)     Looks well.  Not in any acute distress  Eyes-PERRLA.  Fundi-indistinct discs  Neck-supple no adenopathy or thyromegaly  Cardiovascular-heart rate regular.  Heart sounds 1 and 2 with no added sounds no carotid bruits no edema.  Recheck blood pressure at 200/120  Respiratory-normal respiratory effort.  Good bilateral air entry.  Lungs are clear to palpation percussion and auscultation    Recent Lab Results:none      Assessment and Plan:     1.  Hypertensive crisis.  No evidence of CHF.  Patient is transferred to the emergency room for further evaluation  2.  Brachial plexus neuropathy.  Discussed referral to pain management  3.  Syncope.  This will require further workup.  He needs to follow-up with his cardiologist.  I did advise him that he should not be driving      With respect to his Social Security disability.  I recommended that he seek legal advice    Follow-up after emergency room  Orders this visit  No orders of the defined types were placed in this encounter.

## 2023-10-05 NOTE — ED Provider Notes (Signed)
History     Chief Complaint   Patient presents with    Hypertension     James Oneill is a 47 year old male that presents to the emergency room his primary care office for concerns for hypertensive crisis.  Patient was seen in the office today with complaints of numerous syncopal episodes at night.  He states that he has gotten up and states he will normally have dizziness when he goes from lying to sitting and standing.  He has had several episodes where he has gotten out of bed in the middle of the night and passed out.  He has had a previous history of throat cancer that was treated with radiation and then finally a radical neck dissection.  He also has had right lower back pain states that he has numbness and tingling down his left arm with fasciculations that have been occurring since the radical neck dissection.  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    History of cholecystectomy Z90.49    MVP (mitral valve prolapse) I34.1    Depression F32.A    Neuropathy G62.9    Chronic fatigue R53.82    Hypertension I10    ED (erectile dysfunction) N52.9    monitoring for cancer therapy related  cardiac dysfunction Z91.89            Past Surgical History:   Procedure Laterality Date    ELBOW FRACTURE SURGERY      GALLBLADDER SURGERY  2019    HERNIA REPAIR  2019    umbilical    NECK SURGERY Left 2018    radical dissection    TONSILLECTOMY  2017    SCC Left tonsil          Social History     Tobacco Use    Smoking status: Never    Smokeless tobacco: Never   Substance Use Topics    Alcohol use: Yes     Alcohol/week: 1.0 standard drink of alcohol     Types: 1 Glasses of wine per week    Drug use: Not Currently             Review of Systems   Constitutional:  Positive for  fatigue.   Cardiovascular:  Positive for chest pain.   Gastrointestinal:         Difficulty swallowing   Musculoskeletal:  Positive for back pain.   Neurological:  Positive for dizziness, syncope, light-headedness and headaches.       Physical Exam     Triage Vitals  Triage Start: Start, (10/05/23 1423)  First Recorded BP: (!) 177/117, Resp: 16, Temp: 36.1 C (97 F) Oxygen Therapy SpO2: 97 %, Oximetry Source: Rt Hand, O2 Device: None (Room air), Heart Rate: 84, (10/05/23 1424) Heart Rate (via Pulse Ox): 84, (10/05/23 1424).  First Pain Reported  0-10 Scale: 4, Pain Location/Orientation: Head, Pain Descriptors: Aching, (10/05/23 1424)       Physical Exam  Vitals reviewed.   Constitutional:       Appearance: He is ill-appearing.   HENT:      Head: Normocephalic.  Right Ear: Tympanic membrane normal.      Left Ear: Tympanic membrane normal.      Nose: Nose normal.      Mouth/Throat:      Mouth: Mucous membranes are moist.   Eyes:      Extraocular Movements: Extraocular movements intact.      Pupils: Pupils are equal, round, and reactive to light.   Neck:      Vascular: No carotid bruit.   Cardiovascular:      Rate and Rhythm: Normal rate and regular rhythm.      Heart sounds: Murmur heard.   Pulmonary:      Effort: Pulmonary effort is normal.      Breath sounds: Normal breath sounds.   Abdominal:      General: Bowel sounds are normal.      Palpations: Abdomen is soft.   Musculoskeletal:         General: Normal range of motion.      Cervical back: Normal range of motion and neck supple.   Skin:     General: Skin is warm.   Neurological:      General: No focal deficit present.      Mental Status: He is alert and oriented to person, place, and time.   Psychiatric:         Mood and Affect: Mood normal.         Behavior: Behavior normal.       Recent Results (from the past 24 hours)   CBC and differential    Collection Time: 10/05/23  3:25 PM   Result Value Ref Range    WBC 5.9 3.5 - 11.0 THOU/uL    RBC 4.7 4.0 - 6.0  MIL/uL    Hemoglobin 14.6 12.0 - 17.0 g/dL    Hematocrit 41 37 - 52 %    MCV 88 75 - 100 fL    RDW 11.9 0.0 - 15.0 %    Platelets 301 150 - 450 THOU/uL    Seg Neut % 58.4 %    Neut # K/uL 3.4 1.5 - 6.5 THOU/uL    Lymph # K/uL 1.6 1.0 - 5.0 THOU/uL    Mono # K/uL 0.8 0.1 - 1.0 THOU/uL    Eos # K/uL 0.1 0.0 - 0.5 THOU/uL    Baso # K/uL 0.0 0.0 - 0.2 THOU/uL    Nucl RBC % 0.0 0.0 - 0.2 /100 WBC    Nucl RBC # K/uL 0.0 0.0 - 0.0 THOU/uL    IMM Granulocytes # 0.0 0.0 - 0.0 THOU/uL    IMM Granulocytes 0.3 %   Comprehensive metabolic panel    Collection Time: 10/05/23  3:25 PM   Result Value Ref Range    Sodium 138 133 - 145 mmol/L    Potassium 4.2 3.3 - 4.6 mmol/L    Chloride 100 96 - 108 mmol/L    CO2 30 (H) 20 - 28 mmol/L    Anion Gap 8 7 - 16    UN 21 (H) 6 - 20 mg/dL    Creatinine 1.91 (H) 0.67 - 1.17 mg/dL    eGFR BY CREAT 66 *    Glucose 97 60 - 99 mg/dL    Calcium 47.8 8.6 - 29.5 mg/dL    Total Protein 7.7 6.3 - 7.7 g/dL    Albumin 5.2 3.5 - 5.2 g/dL    Bilirubin,Total 1.0 0.0 - 1.2 mg/dL    AST 34 0 - 50 U/L  ALT 43 0 - 50 U/L    Alk Phos 47 40 - 130 U/L   Troponin T 0 HR High Sensitivity    Collection Time: 10/05/23  3:25 PM   Result Value Ref Range    TROP T 0 HR High Sensitivity <6 0 - 11 ng/L   TSH    Collection Time: 10/05/23  3:25 PM   Result Value Ref Range    TSH 3.05 0.27 - 4.20 uIU/mL   T4, free    Collection Time: 10/05/23  3:25 PM   Result Value Ref Range    Free T4 1.5 0.9 - 1.7 ng/dL   NT-pro BNP    Collection Time: 10/05/23  3:25 PM   Result Value Ref Range    NT-pro BNP <50 0 - 450 pg/mL   Protime-INR    Collection Time: 10/05/23  3:25 PM   Result Value Ref Range    Protime 11.3 10.0 - 12.9 sec    INR 1.0 0.9 - 1.1   Date/time not provided    Collection Time: 10/05/23  3:25 PM   Result Value Ref Range    Date/Time Not Provided see below    Troponin T 1 HR W/ Delta High Sensitivity    Collection Time: 10/05/23  4:26 PM   Result Value Ref Range    TROP T 1 HR High Sensitivity <6 0 - 11 ng/L    TROP  T 0-1 HR DELTA High Sensitivity see below 0 - 2        Medical Decision Making     Assessment:  Patient has notable vesiculation left side of his face and down into the neck.  He has muscle wasting to the left side of his body.  He has had chest pain, he has had numerous syncopal episodes mostly happen at nighttime.  He does have some generalized dizziness when making position changes.  Patient is currently being treated with gabapentin for brachial plexus injury.    Differential diagnosis:  Syncopal episode  Acute coronary syndrome  Hypertensive crisis  Carotid obstruction    Plan:  Bnp, trops , PT /INR, TSH, T4, CBC. CMP, CT head, CTA head and neck    ED Course and Disposition:  Labs reviewed troponins are negative.  BNP was negative so no obvious congestive heart failure.  Thyroid studies negative kidney function does demonstrate a decline with a BUN of 21 and creatinine of 1.32.  CBC is within normal limits.  Given patient continues to have the syncopal episodes with his radical neck dissection I am concerned for possibilities of an occlusion of his left ICA versus carotid.  CT angio of the neck and head demonstratePatent anterior and posterior circulations of the brain, without large vessel occlusion or aneurysm.    Patent carotid and vertebral arteries of the neck, without stenosis or dissection.    Noncontrast head CT demonstrates no acute process.  This was reviewed with the patient.  At this point patient has been previously recommended to have a stress echo.  I do feel given his murmur and might history of mitral valve prolapse it would benefit [place] to complete the stress echo.  We will order this as outpatient.  Patient was advised to follow-up with his primary care provider.  At this point his blood pressure has improved.  He is advised to increase his metoprolol to 50 mg daily.  New prescription was sent to the pharmacy.  James Smolder, NP            James Oneill, Cecil Cranker, NP  10/05/23 [phone]

## 2023-10-06 ENCOUNTER — Observation Stay
Admission: EM | Admit: 2023-10-06 | Discharge: 2023-10-09 | Disposition: A | Discharge status: Home / Self Care | Payer: Medicare Other | Source: Ambulatory Visit | Attending: Emergency Medicine | Admitting: Emergency Medicine

## 2023-10-06 ENCOUNTER — Other Ambulatory Visit: Payer: Self-pay

## 2023-10-06 DIAGNOSIS — Z8589 Personal history of malignant neoplasm of other organs and systems: Secondary | ICD-10-CM | POA: Insufficient documentation

## 2023-10-06 DIAGNOSIS — Z8619 Personal history of other infectious and parasitic diseases: Secondary | ICD-10-CM | POA: Insufficient documentation

## 2023-10-06 DIAGNOSIS — F32A Depression, unspecified: Secondary | ICD-10-CM | POA: Insufficient documentation

## 2023-10-06 DIAGNOSIS — I341 Nonrheumatic mitral (valve) prolapse: Secondary | ICD-10-CM | POA: Diagnosis present

## 2023-10-06 DIAGNOSIS — G629 Polyneuropathy, unspecified: Secondary | ICD-10-CM | POA: Diagnosis present

## 2023-10-06 DIAGNOSIS — Z79899 Other long term (current) drug therapy: Secondary | ICD-10-CM | POA: Insufficient documentation

## 2023-10-06 DIAGNOSIS — N189 Chronic kidney disease, unspecified: Secondary | ICD-10-CM | POA: Insufficient documentation

## 2023-10-06 DIAGNOSIS — S0181XA Laceration without foreign body of other part of head, initial encounter: Principal | ICD-10-CM

## 2023-10-06 DIAGNOSIS — E039 Hypothyroidism, unspecified: Secondary | ICD-10-CM | POA: Diagnosis present

## 2023-10-06 DIAGNOSIS — S0121XA Laceration without foreign body of nose, initial encounter: Secondary | ICD-10-CM | POA: Insufficient documentation

## 2023-10-06 DIAGNOSIS — R1310 Dysphagia, unspecified: Secondary | ICD-10-CM | POA: Insufficient documentation

## 2023-10-06 DIAGNOSIS — S01111A Laceration without foreign body of right eyelid and periocular area, initial encounter: Secondary | ICD-10-CM | POA: Insufficient documentation

## 2023-10-06 DIAGNOSIS — I129 Hypertensive chronic kidney disease with stage 1 through stage 4 chronic kidney disease, or unspecified chronic kidney disease: Secondary | ICD-10-CM | POA: Insufficient documentation

## 2023-10-06 DIAGNOSIS — I951 Orthostatic hypotension: Principal | ICD-10-CM | POA: Insufficient documentation

## 2023-10-06 DIAGNOSIS — E785 Hyperlipidemia, unspecified: Secondary | ICD-10-CM | POA: Diagnosis present

## 2023-10-06 DIAGNOSIS — R55 Syncope and collapse: Secondary | ICD-10-CM | POA: Diagnosis present

## 2023-10-06 DIAGNOSIS — E781 Pure hyperglyceridemia: Secondary | ICD-10-CM | POA: Insufficient documentation

## 2023-10-06 DIAGNOSIS — I10 Essential (primary) hypertension: Secondary | ICD-10-CM | POA: Diagnosis present

## 2023-10-06 DIAGNOSIS — Q283 Other malformations of cerebral vessels: Secondary | ICD-10-CM | POA: Insufficient documentation

## 2023-10-06 DIAGNOSIS — G540 Brachial plexus disorders: Secondary | ICD-10-CM | POA: Insufficient documentation

## 2023-10-06 NOTE — ED Triage Notes (Signed)
Arrived ambulatory with complaints of passing out and striking face on counter. Has lac on nose and under right eye.  Pt states he was here yesterday for elevated B/P. Increased dose of metoprolol yesterday to 50mg .    Prehospital medications given: No

## 2023-10-06 NOTE — ED Provider Notes (Signed)
History     Chief Complaint   Patient presents with   . Syncope   . Facial Laceration     Patient a 47 year old male with past medical history concerning frequent for throat cancer status post radiation and dissection 4 years ago, depression, MVP, multiple episodes of syncope over the past few months who was seen here yesterday for similar complaint.  Patient states roughly a year ago in September he had high blood pressure and had outpatient echo at that time.  It showed mitral valve prolapse with states had for a long period of time.  He has been taking metoprolol 25 mg since that time.  He had a primary care doctor yesterday where he was found to be hypertensive in the 200s and came here for evaluation.  He had thorough workup including thyroid studies, CT imaging of head and neck that was overall unremarkable.  He was discharged with plan for stress echo outpatient and told to increase his metoprolol to double the dose.  Today patient was watching football games and he went up to let his dogs out when he got lightheaded and dizzy and syncopized hitting his head on the counter.  He had presyncopal symptoms.  There was no postictal period.  When he stands up too quick he notes he gets dizzy frequently is usually able to slow himself down and it will subside.  However, he does report this about his eighth time passing out in the past couple months.  He reports having dizzy episodes for over a year.      History provided by:  Patient        Medical/Surgical/Family History     Past Medical History:   Diagnosis Date   . Cancer    . Depression    . MVP (mitral valve prolapse)         Patient Active Problem List   Diagnosis Code   . 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 History of cholecystectomy Z90.49   . MVP (mitral valve prolapse) I34.1   . Depression F32.A   . Neuropathy G62.9   . Chronic fatigue R53.82   . Hypertension I10   . ED (erectile dysfunction) N52.9   . monitoring for cancer therapy related  cardiac dysfunction  Z91.89            Past Surgical History:   Procedure Laterality Date   . ELBOW FRACTURE SURGERY     . GALLBLADDER SURGERY  2019   . HERNIA REPAIR  2019    umbilical   . NECK SURGERY Left 2018    radical dissection   . TONSILLECTOMY  2017    SCC Left tonsil          Social History     Tobacco Use   . Smoking status: Never   . Smokeless tobacco: Never   Substance Use Topics   . Alcohol use: Yes     Alcohol/week: 1.0 standard drink of alcohol     Types: 1 Glasses of wine per week   . Drug use: Not Currently             Review of Systems    Physical Exam     Triage Vitals  Triage Start: Start, (10/06/23 2308)  First Recorded BP: (!) 137/93, Resp: 18, Temp: 35.9 C (96.6 F), Temp src: TEMPORAL Oxygen Therapy SpO2: 100 %, Oximetry Source: Rt Hand, O2 Device: None (Room air), Heart Rate: 69, (10/06/23 2311) Heart Rate (via Pulse Ox): 68, (  10/06/23 2311).  First Pain Reported  0-10 Scale: 3, Pain Location/Orientation: Face, (10/06/23 2311)       Physical Exam  Vitals and nursing note reviewed.   Constitutional:       General: He is not in acute distress.     Appearance: Normal appearance.   HENT:      Head:      Comments: Laceration over the bridge of nose    Superficial laceration on the right eye extending roughly 4 cm  Eyes:      Conjunctiva/sclera: Conjunctivae normal.   Cardiovascular:      Rate and Rhythm: Normal rate and regular rhythm.   Pulmonary:      Effort: Pulmonary effort is normal. No respiratory distress.   Skin:     General: Skin is warm and dry.      Capillary Refill: Capillary refill takes less than 2 seconds.   Neurological:      Mental Status: He is alert and oriented to person, place, and time. Mental status is at baseline.   Psychiatric:         Mood and Affect: Mood normal.       Medical Decision Making   Patient seen by me on:  10/07/2023    Assessment:  Patient a 47 year old male who presents emergency department today for concern of syncopal episode with head injury.  Patient has had thorough  workup yesterday for this.  However, museum and phosphorus were not checked we will plan to get those in addition to repeat CBC and BMP.  Will plan to get CT angio of his chest to rule out pulmonary embolism as this could possibly be causing him to pass out and he might not be able to mount a tachycardic response in the setting of beta-blocker use.  I am concerned given multiple episodes of syncope that he would likely benefit from inpatient monitoring on telemetry and possible stress echo in the morning with cardiology consult.  Plan to keep him on telemetry and get an EKG.    Differential diagnosis:  ***    Plan:  Orders Placed This Encounter      CT angio chest      CBC and differential      Basic metabolic panel      Magnesium      Phosphorus       ED Course and Disposition:  .    [employer] was used for part/all of this encounter. Errors in grammar were changed and fixed to the best of my ability           Ma Hillock, DO          Author:  Ma Hillock, DO

## 2023-10-07 ENCOUNTER — Emergency Department: Payer: Medicare Other

## 2023-10-07 ENCOUNTER — Observation Stay: Payer: Medicare Other

## 2023-10-07 DIAGNOSIS — E785 Hyperlipidemia, unspecified: Secondary | ICD-10-CM | POA: Diagnosis present

## 2023-10-07 DIAGNOSIS — R42 Dizziness and giddiness: Secondary | ICD-10-CM

## 2023-10-07 DIAGNOSIS — R55 Syncope and collapse: Secondary | ICD-10-CM | POA: Diagnosis present

## 2023-10-07 DIAGNOSIS — R9431 Abnormal electrocardiogram [ECG] [EKG]: Secondary | ICD-10-CM

## 2023-10-07 DIAGNOSIS — Z85819 Personal history of malignant neoplasm of unspecified site of lip, oral cavity, and pharynx: Secondary | ICD-10-CM

## 2023-10-07 DIAGNOSIS — E039 Hypothyroidism, unspecified: Secondary | ICD-10-CM | POA: Diagnosis present

## 2023-10-07 LAB — PHOSPHORUS: Phosphorus: 2.8 mg/dL (ref 2.7–4.5)

## 2023-10-07 LAB — URINALYSIS WITH REFLEX TO MICROSCOPIC
Blood,UA: NEGATIVE
Glucose,UA: NEGATIVE
Ketones, UA: NEGATIVE
Leuk Esterase,UA: NEGATIVE
Nitrite,UA: NEGATIVE
Protein,UA: NEGATIVE
Specific Gravity,UA: 1.01 (ref 1.002–1.030)
pH,UA: 6.5 (ref 5.0–8.0)

## 2023-10-07 LAB — CBC AND DIFFERENTIAL
Baso # K/uL: 0 10*3/uL (ref 0.0–0.2)
Eos # K/uL: 0.1 10*3/uL (ref 0.0–0.5)
Hematocrit: 39 % (ref 37–52)
Hemoglobin: 13.8 g/dL (ref 12.0–17.0)
IMM Granulocytes #: 0 10*3/uL (ref 0.0–0.0)
IMM Granulocytes: 0.4 %
Lymph # K/uL: 1.7 10*3/uL (ref 1.0–5.0)
MCV: 88 fL (ref 75–100)
Mono # K/uL: 0.6 10*3/uL (ref 0.1–1.0)
Neut # K/uL: 2.3 10*3/uL (ref 1.5–6.5)
Nucl RBC # K/uL: 0 10*3/uL (ref 0.0–0.0)
Nucl RBC %: 0 /100{WBCs} (ref 0.0–0.2)
Platelets: 246 10*3/uL (ref 150–450)
RBC: 4.4 MIL/uL (ref 4.0–6.0)
RDW: 11.9 % (ref 0.0–15.0)
Seg Neut %: 48.4 %
WBC: 4.8 10*3/uL (ref 3.5–11.0)

## 2023-10-07 LAB — EKG 12-LEAD
P: 36 deg
PR: 209 ms
QRS: 81 deg
QRSD: 112 ms
QT: 423 ms
QTc: 441 ms
Rate: 65 {beats}/min
T: 121 deg

## 2023-10-07 LAB — BASIC METABOLIC PANEL
Anion Gap: 8 (ref 7–16)
CO2: 29 mmol/L — ABNORMAL HIGH (ref 20–28)
Calcium: 9.4 mg/dL (ref 8.6–10.2)
Chloride: 103 mmol/L (ref 96–108)
Creatinine: 1.34 mg/dL — ABNORMAL HIGH (ref 0.67–1.17)
Glucose: 123 mg/dL — ABNORMAL HIGH (ref 60–99)
Lab: 18 mg/dL (ref 6–20)
Potassium: 3.8 mmol/L (ref 3.3–4.6)
Sodium: 140 mmol/L (ref 133–145)
eGFR BY CREAT: 65 *

## 2023-10-07 LAB — MAGNESIUM: Magnesium: 1.8 mg/dL (ref 1.6–2.5)

## 2023-10-07 MED ORDER — NIFEDIPINE CR OSMOTIC 30 MG PO TB24 *I*
30.0000 mg | ORAL_TABLET | Freq: Two times a day (BID) | ORAL | Status: DC
Start: 2023-10-07 — End: 2023-10-07
  Administered 2023-10-07: 30 mg via ORAL
  Filled 2023-10-07: qty 1

## 2023-10-07 MED ORDER — ENOXAPARIN SODIUM 40 MG/0.4ML IJ SOSY *I*
40.0000 mg | PREFILLED_SYRINGE | Freq: Every day | INTRAMUSCULAR | Status: DC
Start: 2023-10-07 — End: 2023-10-09

## 2023-10-07 MED ORDER — LEVOTHYROXINE SODIUM 25 MCG PO TABS *I*
25.0000 ug | ORAL_TABLET | Freq: Every day | ORAL | Status: DC
Start: 2023-10-07 — End: 2023-10-09
  Administered 2023-10-07 – 2023-10-09 (×3): 25 ug via ORAL
  Filled 2023-10-07 (×3): qty 1

## 2023-10-07 MED ORDER — LOSARTAN POTASSIUM 50 MG PO TABS *I*
50.0000 mg | ORAL_TABLET | Freq: Every day | ORAL | Status: DC
Start: 2023-10-07 — End: 2023-10-07
  Administered 2023-10-07: 50 mg via ORAL
  Filled 2023-10-07: qty 1

## 2023-10-07 MED ORDER — PANTOPRAZOLE SODIUM 40 MG PO TBEC *I*
40.0000 mg | DELAYED_RELEASE_TABLET | Freq: Every morning | ORAL | Status: DC
Start: 2023-10-07 — End: 2023-10-22
  Administered 2023-10-07 – 2023-10-09 (×3): 40 mg via ORAL
  Filled 2023-10-07 (×3): qty 1

## 2023-10-07 MED ORDER — GABAPENTIN 300 MG PO CAPSULE *I*
600.0000 mg | ORAL_CAPSULE | Freq: Three times a day (TID) | ORAL | Status: DC | PRN
Start: 2023-10-07 — End: 2023-10-09
  Administered 2023-10-09: 600 mg via ORAL
  Filled 2023-10-07: qty 2

## 2023-10-07 MED ORDER — ATORVASTATIN CALCIUM 40 MG PO TABS *I*
40.0000 mg | ORAL_TABLET | Freq: Every day | ORAL | Status: DC
Start: 2023-10-08 — End: 2023-12-06
  Administered 2023-10-08 – 2023-10-09 (×2): 40 mg via ORAL
  Filled 2023-10-07 (×2): qty 1

## 2023-10-07 MED ORDER — LIDOCAINE-EPINEPHRINE 1 %-1:100000 IJ SOLN *I*
1.0000 mL | Freq: Once | INTRAMUSCULAR | Status: AC
Start: 2023-10-07 — End: 2023-10-07
  Administered 2023-10-07: 1 mL via SUBCUTANEOUS
  Filled 2023-10-07: qty 50

## 2023-10-07 MED ORDER — LACTATED RINGERS IV SOLN *I*
125.0000 mL/h | INTRAVENOUS | Status: AC
Start: 2023-10-07 — End: 2023-10-07
  Administered 2023-10-07: 125 mL/h via INTRAVENOUS

## 2023-10-07 MED ORDER — IOHEXOL 350 MG/ML (OMNIPAQUE) IV SOLN 500ML BOTTLE *I*
1.0000 mL | Freq: Once | INTRAVENOUS | Status: AC
Start: 2023-10-07 — End: 2023-10-08
  Administered 2023-10-07: 70 mL via INTRAVENOUS

## 2023-10-07 MED ORDER — FENOFIBRATE 48 MG PO TABS *I*
145.0000 mg | ORAL_TABLET | Freq: Every day | ORAL | Status: DC
Start: 2023-10-07 — End: 2023-10-09
  Administered 2023-10-07 – 2023-10-09 (×3): 144 mg via ORAL
  Filled 2023-10-07 (×3): qty 3

## 2023-10-07 MED ORDER — FLUOXETINE HCL 20 MG PO CAPS *I*
40.0000 mg | ORAL_CAPSULE | Freq: Every day | ORAL | Status: DC
Start: 2023-10-07 — End: 2023-10-09
  Administered 2023-10-07 – 2023-10-09 (×3): 40 mg via ORAL
  Filled 2023-10-07 (×3): qty 2

## 2023-10-07 MED ORDER — NIFEDIPINE CR OSMOTIC 30 MG PO TB24 *I*
30.0000 mg | ORAL_TABLET | Freq: Every day | ORAL | Status: DC
Start: 2023-10-08 — End: 2023-10-08

## 2023-10-07 MED ORDER — ACETAMINOPHEN 325 MG PO TABS *I*
650.0000 mg | ORAL_TABLET | Freq: Four times a day (QID) | ORAL | Status: DC | PRN
Start: 2023-10-07 — End: 2023-12-06
  Administered 2023-10-09: 650 mg via ORAL
  Filled 2023-10-07: qty 2

## 2023-10-07 MED ORDER — ATORVASTATIN CALCIUM 20 MG PO TABS *I*
20.0000 mg | ORAL_TABLET | Freq: Every day | ORAL | Status: DC
Start: 2023-10-07 — End: 2023-10-07
  Administered 2023-10-07: 20 mg via ORAL
  Filled 2023-10-07: qty 1

## 2023-10-07 MED ORDER — CYCLOBENZAPRINE HCL 10 MG PO TABS *I*
10.0000 mg | ORAL_TABLET | Freq: Three times a day (TID) | ORAL | Status: DC | PRN
Start: 2023-10-07 — End: 2023-12-06
  Administered 2023-10-07 – 2023-10-09 (×5): 10 mg via ORAL
  Filled 2023-10-07 (×5): qty 1

## 2023-10-07 MED ORDER — GABAPENTIN 300 MG PO CAPSULE *I*
600.0000 mg | ORAL_CAPSULE | Freq: Three times a day (TID) | ORAL | Status: DC
Start: 2023-10-07 — End: 2023-10-07
  Administered 2023-10-07: 600 mg via ORAL
  Filled 2023-10-07: qty 2

## 2023-10-07 MED ORDER — NIFEDIPINE CR OSMOTIC 30 MG PO TB24 *I*
30.0000 mg | ORAL_TABLET | Freq: Every day | ORAL | Status: DC
Start: 2023-10-07 — End: 2023-10-07

## 2023-10-07 MED ORDER — HYDROCODONE-ACETAMINOPHEN 5-325 MG PO TABS *I*
1.0000 | ORAL_TABLET | Freq: Four times a day (QID) | ORAL | Status: DC | PRN
Start: 2023-10-07 — End: 2023-10-10
  Administered 2023-10-07 – 2023-10-09 (×7): 1 via ORAL
  Filled 2023-10-07 (×7): qty 1

## 2023-10-07 NOTE — Progress Notes (Signed)
Pt resting in bed, able to make needs known. Pt is alert and oriented x4 and independent in room. Right AC saline locked flushed, clamped, dressing CDI. Telemetry continues. PRN pain medication given per request. Call bell in reach. Denies needs at this time.

## 2023-10-07 NOTE — ED Procedure Documentation (Signed)
Procedures   Laceration repair    Date/Time: 10/06/2023 11:06 PM    Performed by: Ma Hillock, DO  Authorized by: Ma Hillock, DO    Consent:     Consent obtained:  Verbal    Consent given by:  Patient    Risks discussed:  Infection, need for additional repair, nerve damage, pain, poor cosmetic result, poor wound healing and retained foreign body    Alternatives discussed:  No treatment  Universal protocol:     Procedure explained and questions answered to patient or proxy's satisfaction: yes      Site/side marked: yes      Immediately prior to procedure, a time out was called: yes      Patient identity confirmed:  Verbally with patient and arm band  Anesthesia:     Anesthesia method:  Local infiltration    Local anesthetic:  Lidocaine 1% WITH epi  Laceration details:     Location:  Face    Face location:  Nose    Length (cm):  3    Depth (mm):  2  Pre-procedure details:     Preparation:  Patient was prepped and draped in usual sterile fashion  Exploration:     Wound extent: no areolar tissue violation noted, no fascia violation noted, no foreign bodies/material noted, no muscle damage noted, no tendon damage noted and no underlying fracture noted      Contaminated: no    Treatment:     Area cleansed with:  Saline    Amount of cleaning:  Standard    Irrigation solution:  Sterile saline    Irrigation volume:  100cc    Irrigation method:  Syringe  Skin repair:     Repair method:  Sutures    Suture size:  4-0    Wound skin closure material used: mon0cryl.    Suture technique:  Simple interrupted    Number of sutures:  4  Approximation:     Approximation:  Close  Repair type:     Repair type:  Simple  Post-procedure details:     Dressing:  Open (no dressing)    Procedure completion:  Tolerated well, no immediate complications      Ma Hillock, DO     Ma Hillock, DO  10/07/23 0148

## 2023-10-07 NOTE — Plan of Care (Signed)
Problem: Safety  Goal: Patient will remain free of falls  Outcome: Progressing towards goal  Goal: Prevent any intentional injury  Outcome: Progressing towards goal     Problem: Pain/Comfort  Goal: Patient's pain or discomfort is manageable  Outcome: Progressing towards goal     Problem: Nutrition  Goal: Patient's nutritional status is maintained or improved  Outcome: Progressing towards goal     Problem: Mobility  Goal: Patient's functional status is maintained or improved  Outcome: Progressing towards goal     Problem: Psychosocial  Goal: Demonstrates ability to cope with illness  Outcome: Progressing towards goal     Problem: Cognitive function  Goal: Cognitive function will be maintained or return to baseline  Description: Interventions:  Delirium Assessment  LIVEBAR Assessment    Outcome: Progressing towards goal  Goal: Lines and tethers will be removed as appropriate  Outcome: Progressing towards goal  Goal: Patient maintains appropriate nutritional intake  Outcome: Progressing towards goal  Goal: Vital signs will be within normal limits  Outcome: Progressing towards goal  Goal: Evidence for potential causes of delirium will be managed  Outcome: Progressing towards goal  Goal: Behaviors will return to baseline  Outcome: Progressing towards goal  Goal: Ambulation and mobility will be maintained  Outcome: Progressing towards goal  Goal: Retention of urine and constipation will be managed  Outcome: Progressing towards goal     Problem: Risk for Impaired Sleep/Wake Cycle  Goal: The patient will maintain an adequate sleep/wake cycle  Outcome: Progressing towards goal

## 2023-10-07 NOTE — Plan of Care (Signed)
James Oneill is alert and oriented, minor lacerations on face from his fall. Tele-SR 80's. Norco for head and neck pain. Resting comfortably, orientation to call system.

## 2023-10-07 NOTE — H&P (Signed)
Patient Name: James Oneill Date of Admission: 10/06/2023   DOB: [birth year] Primary Care Physician: Eustace Quail, MD   Patient MR#: [id number]  Attending Physician: Caron Presume, MD      [facility] Internal Medicine History and Physical Note     Chief Complaint     Fall, loss of consciousness    History of Presenting Illness     James Oneill is a 47 y.o. male with past medical history significant for tonsillar cancer s/p radical neck dissection, hypertension, mitral valve prolapse, dyslipidemia, hypothyroidism, and depression who presents to [facility] with syncopal episode and facial laceration.    Pt was seen in ED yesterday for hypertensive urgency.  Stabilized and discharged to home with plan to increase home metoprolol dose to 50 mg and obtain outpatient stress echo. Today pt reports he was watching TV and got up to let his dogs out when he became dizzy and lost consciousness, striking head on counter.  Denies associated chest pain, dyspnea, diaphoresis, fever, chills, abdominal pain, nausea, vomiting, or recent illness; remainder of ROS as below.    Pt reports these episodes have been occurring for approximately 1 year now and are always associated with position changes (sitting/lying to standing).  He typically manages dizziness by rising slowly but states that sometimes he will forget and get up too quickly.  Follows with cardiology as outpatient.  He also notes some difficulty swallowing and has seen outpatient ENT.  Denies decrease in PO intake or weight loss.    ED course: In ED, VS stable on arrival, labs notable only for UN 29.  CT angio chest without acute findings.  Facial wounds sutured by ED provider without incident      14 Point Review of Systems   Review of Systems   Constitutional:  Negative for chills and fever.   HENT:  Negative for congestion and sinus pain.    Respiratory:  Negative for cough, shortness of breath and wheezing.    Cardiovascular:  Negative for chest pain,  palpitations and leg swelling.   Gastrointestinal:  Negative for abdominal pain, blood in stool, diarrhea, nausea and vomiting.   Genitourinary:  Negative for dysuria and flank pain.   Musculoskeletal:  Positive for falls.   Skin: Negative.    Neurological:  Positive for dizziness and loss of consciousness. Negative for focal weakness and headaches.   Psychiatric/Behavioral:  Positive for depression. Negative for substance abuse and suicidal ideas. The patient is not nervous/anxious.      Past Medical History Past Surgical History   Past Medical History:   Diagnosis Date    Cancer     Depression     MVP (mitral valve prolapse)     Past Surgical History:   Procedure Laterality Date    ELBOW FRACTURE SURGERY      GALLBLADDER SURGERY  2019    HERNIA REPAIR  2019    umbilical    NECK SURGERY Left 2018    radical dissection    TONSILLECTOMY  2017    SCC Left tonsil      Social History Family History   Social History     Socioeconomic History    Marital status: Divorced   Tobacco Use    Smoking status: Never    Smokeless tobacco: Never   Substance and Sexual Activity    Alcohol use: Yes     Alcohol/week: 1.0 standard drink of alcohol     Types: 1 Glasses of wine per week  Drug use: Not Currently    Sexual activity: Not Currently      Family History   Problem Relation Age of Onset    Cancer Father     Dementia Maternal Grandmother         Allergies     Allergies   Allergen Reactions    Shellfish-Derived Products Hives     Medications     Hospital administered medications:  Scheduled Meds:   atorvastatin  20 mg Oral Daily    pantoprazole  40 mg Oral QAM    FLUoxetine  40 mg Oral Daily    gabapentin  600 mg Oral TID    levothyroxine  25 mcg Oral Daily @ 0600    losartan  50 mg Oral Daily    enoxaparin  40 mg Subcutaneous Daily @ 2100     PRN Meds:   HYDROcodone-acetaminophen  1 tablet Oral Q6H PRN    acetaminophen  650 mg Oral Q6H PRN     Continuous Infusions:   Lactated Ringers       Home Medications:  Prior to Admission  medications    Medication Sig Start Date End Date Taking? Authorizing Provider   metoprolol succinate ER (TOPROL-XL) 25 mg 24 hr tablet Take 2 tablets (50 mg total) by mouth daily. Do not crush or chew. May be divided. 10/05/23 11/04/23 Yes Cox, Heather A, NP   cyclobenzaprine (FLEXERIL) 5 mg tablet Take 1 tablet (5 mg total) by mouth 3 times daily as needed for Muscle spasms. 10/01/23  Yes Eustace Quail, MD   FLUoxetine (PROZAC) 40 mg capsule Take 1 capsule (40 mg total) by mouth daily. 10/01/23  Yes Eustace Quail, MD   HYDROcodone-acetaminophen [facility]) 5-325 mg per tablet Take 1 tablet by mouth every 6 hours as needed for Pain. Max daily dose: 4 tablets 10/01/23  Yes Eustace Quail, MD   levothyroxine (SYNTHROID, LEVOTHROID) 25 mcg tablet TAKE 1 TABLET BY MOUTH EVERY DAY BEFORE BREAKFAST 09/24/23  Yes Eustace Quail, MD   losartan (COZAAR) 50 mg tablet TAKE 1 TABLET BY MOUTH EVERY DAY 08/10/23  Yes Roma Kayser, MD   atorvastatin (LIPITOR) 20 mg tablet Take 1 tablet (20 mg total) by mouth daily. 05/24/23 05/23/24 Yes Eustace Quail, MD   esomeprazole magnesium (NEXIUM) 40 mg capsule Take 1 capsule (40 mg total) by mouth daily (before breakfast). 05/21/23  Yes Eustace Quail, MD   metoprolol succinate ER (TOPROL-XL) 25 mg 24 hr tablet Take 1 tablet (25 mg total) by mouth daily. Do not crush or chew. May be divided. 03/20/23 03/19/24 Yes Roma Kayser, MD   gabapentin 600 mg tablet Take 1 tablet (600 mg total) by mouth 3 times daily. 03/19/23  Yes Eustace Quail, MD   fenofibrate (TRICOR) 145 mg tablet Take 1 tablet (145 mg total) by mouth daily.  Patient not taking: Reported on 10/05/2023 08/21/23   Eustace Quail, MD   mupirocin [facility]) 2 % ointment Apply topically 3 times daily for nasal vestibulitis. to the following a2reas: nasal vestibule 08/13/23   Leighton Roach, MD   fluticasone [facility]) 50 MCG/ACT nasal spray Spray 1 spray into nostril daily 06/02/21   Mliss Sax, MD      Physical Exam     Vital  Signs:  Vitals:    10/06/23 2311   BP: (!) 137/93   BP Location: Right arm   Pulse: 69   Resp: 18   Temp: 35.9 C (96.6 F)   TempSrc: Temporal   SpO2: 100%  Weight: 85.3 kg (188 lb)   Height: 1.778 m (5\' 10" )     Oxygen Requirement:        Weight:  Wt Readings from Last 3 Encounters:   10/06/23 85.3 kg (188 lb)   10/05/23 83.9 kg (185 lb)   10/05/23 85.6 kg (188 lb 12.8 oz)     Physical Exam  Constitutional:       General: He is not in acute distress.     Appearance: Normal appearance.   HENT:      Head: Normocephalic. Abrasion and laceration present.      Mouth/Throat:      Pharynx: Oropharynx is clear. No pharyngeal swelling or posterior oropharyngeal erythema.   Eyes:      Pupils: Pupils are equal, round, and reactive to light.   Cardiovascular:      Rate and Rhythm: Normal rate and regular rhythm.      Pulses: Normal pulses.      Heart sounds: Murmur heard.   Pulmonary:      Effort: Pulmonary effort is normal.      Breath sounds: Normal breath sounds.   Abdominal:      General: Bowel sounds are normal. There is no distension.      Palpations: Abdomen is soft.      Tenderness: There is no abdominal tenderness.   Skin:     General: Skin is warm and dry.   Neurological:      General: No focal deficit present.      Mental Status: He is alert and oriented to person, place, and time.      Cranial Nerves: No dysarthria or facial asymmetry.   Psychiatric:         Attention and Perception: Attention normal.         Mood and Affect: Mood and affect normal.         Speech: Speech normal.       Labs     CBC BMP   Recent Labs   Lab 10/07/23  0005 10/05/23  1525   WBC 4.8 5.9   Hemoglobin 13.8 14.6   Hematocrit 39 41   Platelets 246 301        Recent Labs   Lab 10/07/23  0005 10/05/23  1525   Sodium 140 138   Potassium 3.8 4.2   Chloride 103 100   CO2 29* 30*   UN 18 21*   Creatinine 1.34* 1.32*   Glucose 123* 97   Calcium 9.4 10.2   Albumin  --  5.2   Phosphorus 2.8  --         Coags LFTs   Recent Labs   Lab 10/05/23  1525    INR 1.0   Protime 11.3        Recent Labs   Lab 10/05/23  1525   Alk Phos 47   Bilirubin,Total 1.0   Albumin 5.2   ALT 43   AST 34   Total Protein 7.7        Cardiac Enzymes Other   No results for input(s): "CKTS", "TROPU", "TROP", "MCKMB", "CKMB" in the last 168 hours.    No components found with this basename: "RICKMBS"   ABG: No results for input(s): "APH", "APCO2", "APO2", "AOSAT", "ABE", "LAC", "CO" in the last 168 hours.       Microbiology results           Radiology Results   CT angio chest    Result Date: 10/07/2023  No pulmonary embolism. No acute cardiopulmonary findings. END OF IMPRESSION I have personally reviewed the images and the Resident's/Fellow's interpretation and agree with or edited the findings.       [facility] submits this DICOM format image data and final report to the [facility], an independent secure electronic health information exchange, on a reciprocally searchable basis (with patient authorization) for a minimum of 12 months after exam date.    CT angio head for stroke    Result Date: 10/05/2023  Patent anterior and posterior circulations of the brain, without large vessel occlusion or aneurysm. Patent carotid and vertebral arteries of the neck, without stenosis or dissection. Please refer to separately dictated noncontrast CT head. END OF IMPRESSION I have personally reviewed the images and the Resident's/Fellow's interpretation and agree with or edited the findings.       [facility] submits this DICOM format image data and final report to the [facility], an independent secure electronic health information exchange, on a reciprocally searchable basis (with patient authorization) for a minimum of 12 months after exam date.    CT angio neck carotid    Result Date: 10/05/2023  Patent anterior and posterior circulations of the brain, without large vessel occlusion or aneurysm. Patent carotid and vertebral arteries of the neck, without stenosis or dissection. Please refer to  separately dictated noncontrast CT head. END OF IMPRESSION I have personally reviewed the images and the Resident's/Fellow's interpretation and agree with or edited the findings.       [facility] submits this DICOM format image data and final report to the [facility], an independent secure electronic health information exchange, on a reciprocally searchable basis (with patient authorization) for a minimum of 12 months after exam date.    CT head without contrast    Result Date: 10/05/2023   No CT evidence of acute intracranial abnormality. 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 Atelectasis versus hypoplasia of the bilateral maxillary sinuses (right greater than left) with associated mild hyperexpansion of the intraorbital fat. Findings are nonspecific but can be seen with silent sinus syndrome in the correct clinical context. END OF IMPRESSION       [facility] submits this DICOM format image data and final report to the [facility], an independent secure electronic health information exchange, on a reciprocally searchable basis (with patient authorization) for a minimum of 12 months after exam date.    *Chest STANDARD single view    Result Date: 10/05/2023  No acute cardiopulmonary disease. END OF IMPRESSION       [facility] submits this DICOM format image data and final report to the [facility], an independent secure electronic health information exchange, on a reciprocally searchable basis (with patient authorization) for a minimum of 12 months after exam date.   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 male with past medical history significant for tonsillar cancer s/p  radical neck dissection, hypertension, mitral valve prolapse, dyslipidemia, hypothyroidism, and depression who presents to [facility] with syncopal episode  and facial laceration.    Syncopal episode, suspect cardiac mediated  Hx mitral valve prolapse  Hypertension   -telemetry monitoring, fall precautions  -will attempt to obtain stress echo while in hospital given multiple episodes of syncope reported over past 2 months (was scheduled to have stress echo in January but did not complete)  -hold home metoprolol in preparation for possible stress echo  -query possible baroreceptor dysfunction secondary to hx of neck radiation but suspect this is less likely  -will give 1 liter LR over 8 hours given mildly elevated UN and possible decreased PO intake in setting of known dysphagia.  -continue home losartan with hold parameters    Hx tonsillar cancer s/p neck dissection  Left brachial plexus neuropathy  -continue home gabapentin 600 mg TID  -hold home flexeril in setting of syncope  -home Norco PRN for pain    Hypothyroidism  -likely subclinical based on historic T4 levels  -continue home levothyroxine 25 mcg daily    Dyslipidemia  -home atorvastatin 20 mg daily    Depression  -home fluoxetine 40 mg daily        Fluids & Electrolytes: LR at 125 ml/hr for 8 hours  Nutrition: Diet regular  Mobility Plan/Ability: Up with assistance. PT/OT  DVT prophylaxis: Lovenox 40 mg nightly    Code Status: Full Code     Disposition: . Admit patient as observation status. Anticipate 24-48 hour hospital stay.     Total care time > 45 minutes today, > 50% spent counseling and coordinating care.  Case discussed with Dr. Koleen Distance, who is in agreement with assessment and plan.    Signed:   Geanie Logan, NP  10/07/2023  2:18 AM

## 2023-10-07 NOTE — ED Notes (Signed)
Nurse to nurse report given to [facility], all questions addressed.

## 2023-10-07 NOTE — Plan of Care (Signed)
Problem: Pain/Comfort  Goal: Patient's pain or discomfort is manageable  Outcome: Progressing towards goal     Problem: Safety  Goal: Patient will remain free of falls  Outcome: Maintaining  Goal: Prevent any intentional injury  Outcome: Maintaining     Problem: Nutrition  Goal: Patient's nutritional status is maintained or improved  Outcome: Maintaining     Problem: Mobility  Goal: Patient's functional status is maintained or improved  Outcome: Maintaining     Problem: Psychosocial  Goal: Demonstrates ability to cope with illness  Outcome: Maintaining     Problem: Cognitive function  Goal: Cognitive function will be maintained or return to baseline  Description: Interventions:  Delirium Assessment  LIVEBAR Assessment    Outcome: Maintaining  Goal: Lines and tethers will be removed as appropriate  Outcome: Maintaining  Goal: Patient maintains appropriate nutritional intake  Outcome: Maintaining  Goal: Vital signs will be within normal limits  Outcome: Maintaining  Goal: Evidence for potential causes of delirium will be managed  Outcome: Maintaining  Goal: Behaviors will return to baseline  Outcome: Maintaining  Goal: Ambulation and mobility will be maintained  Outcome: Maintaining  Goal: Retention of urine and constipation will be managed  Outcome: Maintaining     Problem: Risk for Impaired Sleep/Wake Cycle  Goal: The patient will maintain an adequate sleep/wake cycle  Outcome: Maintaining

## 2023-10-07 NOTE — Progress Notes (Signed)
Current use for oxygen/CPAP/Bipap: Room air    Patient home use for oxygen/Bipap/ CPAP: Room air    Patient takes the following respiratory medications at home: None    RT will continue to wean oxygen and follow patient throughout patient visit.

## 2023-10-08 ENCOUNTER — Observation Stay: Payer: Medicare Other

## 2023-10-08 DIAGNOSIS — I129 Hypertensive chronic kidney disease with stage 1 through stage 4 chronic kidney disease, or unspecified chronic kidney disease: Secondary | ICD-10-CM

## 2023-10-08 DIAGNOSIS — Q283 Other malformations of cerebral vessels: Secondary | ICD-10-CM

## 2023-10-08 DIAGNOSIS — N189 Chronic kidney disease, unspecified: Secondary | ICD-10-CM

## 2023-10-08 DIAGNOSIS — R55 Syncope and collapse: Secondary | ICD-10-CM

## 2023-10-08 LAB — BASIC METABOLIC PANEL
Anion Gap: 10 (ref 7–16)
CO2: 25 mmol/L (ref 20–28)
Calcium: 9.7 mg/dL (ref 8.6–10.2)
Chloride: 103 mmol/L (ref 96–108)
Creatinine: 1.36 mg/dL — ABNORMAL HIGH (ref 0.67–1.17)
Glucose: 97 mg/dL (ref 60–99)
Lab: 16 mg/dL (ref 6–20)
Potassium: 4.1 mmol/L (ref 3.3–4.6)
Sodium: 138 mmol/L (ref 133–145)
eGFR BY CREAT: 64 *

## 2023-10-08 LAB — PROTEIN,UR + CREAT,UR WITH RATIO: Protein,UR: <50 mg/dL (ref 20–300)

## 2023-10-08 LAB — HEMOGLOBIN A1C: Hemoglobin A1C: 5.6 %

## 2023-10-08 LAB — PHOSPHORUS: Phosphorus: 3 mg/dL (ref 2.7–4.5)

## 2023-10-08 LAB — LIPASE: Lipase: 20 U/L (ref 13–60)

## 2023-10-08 LAB — MAGNESIUM: Magnesium: 1.7 mg/dL (ref 1.6–2.5)

## 2023-10-08 MED ORDER — GADOPICLENOL 0.5 MMOL/ML (VUEWAY) IV SOLN *I*
0.1000 mL/kg | Freq: Once | INTRAVENOUS | Status: AC
Start: 2023-10-08 — End: 2023-10-08
  Administered 2023-10-08: 8.53 mL via INTRAVENOUS

## 2023-10-08 MED ORDER — NIFEDIPINE 10 MG PO CAPS *I*
10.0000 mg | ORAL_CAPSULE | Freq: Two times a day (BID) | ORAL | Status: DC
Start: 2023-10-08 — End: 2023-12-07
  Administered 2023-10-08 – 2023-10-09 (×3): 10 mg via ORAL
  Filled 2023-10-08 (×3): qty 1

## 2023-10-08 MED ORDER — BUPROPION HCL 150 MG PO TB24 *I*
150.0000 mg | ORAL_TABLET | Freq: Every day | ORAL | Status: DC
Start: 2023-10-08 — End: 2023-12-07
  Administered 2023-10-08 – 2023-10-09 (×2): 150 mg via ORAL
  Filled 2023-10-08 (×2): qty 1

## 2023-10-08 NOTE — Progress Notes (Signed)
10/08/23 0739   [facility] Patient Class Review   Patient Class Review Observation   Level of Care Review  Acute     Patient Class effective 10/06/23

## 2023-10-08 NOTE — Progress Notes (Signed)
James Oneill Name: James Oneill Date of Admission: 10/06/2023   DOB: 08/29/1976 Primary Care Physician: Eustace Quail, MD   James Oneill MR#: [id number]  Attending Physician: Caron Presume, MD      [facility] Internal Medicine Progress Note     Interval history     Date of service: 10/08/23   LOS: 0 days     James Oneill is a 47 y.o. 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    Subjective     James Oneill seen and examined at bedside.   No acute overnight events.  Renal US and MRI done this am pending results.  Dobutamine stress echo scheduled for tomorrow.  Hypotensive sbp ranging from 90s to 100s.    James Oneill otherwise denies fever, chills, headache, chest pain, SOB, abdominal pain, nausea, vomiting, diarrhea, constipation, or weakness.      Objective     Vitals:  Vitals:    10/08/23 0400 10/08/23 0431 10/08/23 0538 10/08/23 0721   BP:  94/60  104/63   BP Location:       Pulse:  81  66   Resp: 16 17 16 16    Temp:  36 C (96.8 F)  35.8 C (96.5 F)   TempSrc:  Temporal  Temporal   SpO2:  97%  96%   Weight:       Height:         Oxygen Requirement:        Ins/Outs:     Intake/Output Summary (Last 24 hours) at 10/08/2023 0823  Last data filed at 10/07/2023 1804  Gross per 24 hour   Intake 600 ml   Output 1600 ml   Net -1000 ml     Weight:  Wt Readings from Last 3 Encounters:   10/06/23 85.3 kg (188 lb)   10/05/23 83.9 kg (185 lb)   10/05/23 85.6 kg (188 lb 12.8 oz)     Physical Exam by Systems:  General: Not in acute distress. Conversant and able to make needs known.  HEENT: Normocephalic, atraumatic. PERRLA. EOMI. No lesions on nose or ears. MMM  NECK: Supple, trachea is in midline, no palpable lymphadenopathy.  CVS: S1 S2 heard, RRR, no appreciable  murmurs/rubs/gallops  PULM: normal effort, CTAB, with no appreciable wheezes, rales, or rhonchi  ABD: Bowel sounds active, soft, non tender, not distended. No guarding rigidity or rebound tenderness.   EXT: No clubbing, cyanosis, or edema. Distal pulses 2+  NEURO: A and O to person, place, time and current events. Cranial nerves II-XII are grossly intact.  Sensations intact. Strength 5/5 in bilateral upper and lower extremities.  MSK: No joint swelling or tenderness. ROM intact throughout.    SKIN: Warm, dry and intact with no rashes or ulcers.   PSYCH: Appropriate mood and affect.    Foley: no    Active Hospital Medications     Scheduled Meds    pantoprazole  40 mg Oral QAM    FLUoxetine  40 mg Oral Daily    levothyroxine  25 mcg Oral Daily @ 0600    enoxaparin  40 mg Subcutaneous Daily @ 2100    fenofibrate  144 mg Oral Daily    atorvastatin  40 mg Oral Daily    NIFEdipine  30 mg Oral Daily     PRN Meds   HYDROcodone-acetaminophen  1 tablet Oral Q6H PRN    acetaminophen  650 mg Oral Q6H PRN    cyclobenzaprine  10 mg Oral TID PRN    gabapentin  600 mg Oral TID PRN     Continuous Infusions      Labs     CBC BMP   Recent Labs   Lab 10/07/23  0005 10/05/23  1525   WBC 4.8 5.9   Hemoglobin 13.8 14.6   Hematocrit 39 41   Platelets 246 301        Recent Labs   Lab 10/08/23  0434 10/07/23  0005 10/05/23  1525   Sodium 138 140 138   Potassium 4.1 3.8 4.2   Chloride 103 103 100   CO2 25 29* 30*   UN 16 18 21*   Creatinine 1.36* 1.34* 1.32*   Glucose 97 123* 97   Calcium 9.7 9.4 10.2   Albumin  --   --  5.2   Phosphorus 3.0 2.8  --         Coags LFTs   Recent Labs   Lab 10/05/23  1525   INR 1.0   Protime 11.3        Recent Labs   Lab 10/05/23  1525   Alk Phos 47   Bilirubin,Total 1.0   Albumin 5.2   ALT 43   AST 34   Total Protein 7.7        Cardiac Enzymes Other   No results for input(s): "CKTS", "TROPU", "TROP", "MCKMB", "CKMB" in the last 168 hours.    No components found with this basename: "RICKMBS"   ABG: No results  for input(s): "APH", "APCO2", "APO2", "AOSAT", "ABE", "LAC", "CO" in the last 168 hours.     Microbiology results           Radiology Results   EKG 12 lead (initial)    Result Date: 10/07/2023  Sinus rhythm Borderline  T abnormalities, inferior leads    CT angio chest    Result Date: 10/07/2023  No pulmonary embolism. No acute cardiopulmonary findings. END OF IMPRESSION I have personally reviewed the images and the Resident's/Fellow's interpretation and agree with or edited the findings.       [facility] submits this DICOM format image data and final report to the [facility], an independent secure electronic health information exchange, on a reciprocally searchable basis (with James Oneill authorization) for a minimum of 12 months after exam date.    CT angio head for stroke    Result Date: 10/05/2023  Patent anterior and posterior circulations of the brain, without large vessel occlusion or aneurysm. Patent carotid and vertebral arteries of the neck, without stenosis or dissection. Please refer to separately dictated noncontrast CT head. END OF IMPRESSION I have personally reviewed the images and the Resident's/Fellow's interpretation and agree with or edited the findings.       [facility] submits this DICOM format image data and final report to the [facility], an independent secure electronic health information exchange, on a reciprocally searchable basis (with James Oneill authorization) for a minimum of 12 months after exam date.    CT angio neck carotid    Result Date: 10/05/2023  Patent anterior and posterior circulations of the brain, without large vessel occlusion or aneurysm. Patent carotid and vertebral arteries of the neck, without stenosis or dissection. Please refer to separately dictated noncontrast CT head. END OF IMPRESSION I have personally reviewed the images and the Resident's/Fellow's interpretation and agree with or  edited the findings.       [facility] submits this DICOM format image data and  final report to the [facility], an independent secure electronic health information exchange, on a reciprocally searchable basis (with James Oneill authorization) for a minimum of 12 months after exam date.    CT head without contrast    Result Date: 10/05/2023   No CT evidence of acute intracranial abnormality. 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 Atelectasis versus hypoplasia of the bilateral maxillary sinuses (right greater than left) with associated mild hyperexpansion of the intraorbital fat. Findings are nonspecific but can be seen with silent sinus syndrome in the correct clinical context. END OF IMPRESSION       [facility] submits this DICOM format image data and final report to the [facility], an independent secure electronic health information exchange, on a reciprocally searchable basis (with James Oneill authorization) for a minimum of 12 months after exam date.    *Chest STANDARD single view    Result Date: 10/05/2023  No acute cardiopulmonary disease. END OF IMPRESSION       [facility] submits this DICOM format image data and final report to the [facility], an independent secure electronic health information exchange, on a reciprocally searchable basis (with James Oneill authorization) for a minimum of 12 months after exam date.   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 male with past medical history significant for tonsillar cancer s/p radical neck dissection, hypertension, mitral valve prolapse, dyslipidemia, hypothyroidism, and depression who presents to [facility] with syncopal episode and facial laceration.     Syncopal episode, recurrent    Hx mitral valve prolapse  Orthostatic Hypotension  -telemetry monitoring, fall precautions  -last echo 06/2022 showed Bileaflet prolapse of the mitral valve with mild regurgitation   -EKG shows t wave inversions and q waves in inferior leads but unchanged from prior ekgs   -Positive orthostats: laying 141/91 HR 61, standing 98/61 HR 81  -Discontinued home med metoprolol   -Dobutamine stress echo scheduled for tomorrow    Chronic Kidney Disease; unknown etiology  -BUN/Cr on admission 21/1.32; baseline since 12/2022 Cr has been elevated 1.28  -Pt unaware James Oneill had CKD  -Urine protein/Cr ratio <6 not likely nephropathy  -UA completely normal; negative for blood, glu, protein, ketones  -Bilateral renal US today with doppler    Hx right frontal lobe cavernoma   -CT head without contrast shows stable hyperdensity within right frontal lobe  -last MRI was in 06/2022 showed 0.7cm posterior right frontal cavernoma  -MRI today    Hx Hypertension   -usually uncontrolled outpt but has been hypotensive inpt  -discontinued losartan due to CKD  -discontinued home med metoprolol for stress test  -started on Nifedipine (procardia XL) 30mg  daily but became hypotensive  -Will switch to short acting Nifedipine 10mg  bid    Hypertriglyceridemia  Dyslipidemia  -TG ranges from 480 - 1000s  -Started fenofibrate  -home atorvastatin increased to 40 mg daily     Hx  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   DVT prophylaxis: Lovenox 40 mg nightly     Code Status: Full Code     DISPOSITION:  Dobutamine  stress echo scheduled for tomorrow. MRI head and bilateral renal US today. Possible discharge tomorrow if all negative for acute concerning findings.     Total care time > 35 minutes today, > 50% spent counseling and coordinating care. Case discussed with APP, RN, case management, pharmacy.    Signed:   Caron Presume, MD   10/08/2023  8:23 AM

## 2023-10-08 NOTE — Comprehensive Assessment (Signed)
10:00 am: Case Manager in to talk with patient about needs at discharge. Reports he lives with his mother who was present at bedside. Patient lives with his mother and reports he recently received a letter in the mail that his SSI is being terminated. Patient reports he has contacted them and is contesting the decision. Patient and mother spoke about no one helping them. Attempted to get details but patient and his mother were vague and mainly  talked about how no one is helping. Patient stated he is depressed and doesn't do anything. Questioned if he has discussed this with Dr. Dwaine Gale, his PCP. Also asked if he would be interested in seeing someone at [facility] in [facility]. Patient did not give an answer to either question. Mother asked about getting a shower chair. Patient stated when he bends his head to shampoo his hair he feels lightheaded. Instructed patient and mother to measure the interior of the tub. Phone number for [employer] [employer] [facility] [employer] [employer] given to patient to obtain one at no cost. Phone number of [employer] given as patient stated he was in [employer] but is not service connected. Also discussed Medicaid and provided phone number and name. Patient stated he had already contacted [facility]. Patient had no further questions or concerns that can be addressed while in the hospital. Case Management will follow until discharged.   10/08/23 1520   Demographics   Religious Beliefs None   County of Residence [place]   Marital status Divorced   Ethnicity/Race Caucasian   Primary Language English   Insurance Information Medicare   Risk Factors   Risk Factors Adjustment to Dx/Injury/Illness;Insurance/financial issues  (Has notification that he is being taken off SSI Feb.1.)   Health Care Directives Screen (Also required for Hospice Patients)   *Has patient (or family) completed any of the following? (select all that apply) None completed   *Would they like to discuss any issues related to MOLST, DNR Order, HCP,  Living Will, or POA? No   *Health Care Directive teaching done No   Living Situation   Living Situation Home   Lives With Other (comment)  (Mother)   Can they assist patient after discharge? Yes   Home Geography   Type of Home [facility]   # Of Steps In Home 0   # Steps to Enter Home 5  (Railing)   Bedroom First floor   Bathroom First floor - full  (Tub/shower)   Utilitites Working Yes   Baseline ADL functioning   Transfers Independent   Ambulation Independent   Assistive Device none   Bathing/Grooming Independent   Meal Prep With assistance  (Mother cooks)   Able to feed self? Yes   Household maintenance/chores With assistance  (Mother does)   Able to drive? Yes  (just was told by PCP not to drive until more tests are done for Syncope)   Dialysis   Does Patient have Dialysis? No   Income Information   Vocational On disability   Income Situation SSD  (Received letter his Disability will end in Jan. 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  No   Home Care Services   Do you currently have home care services? No   Current Home Equipment None   SW Home oxygen   Do you have home oxygen? No  SW/CM Response to Interventions   Comment Phone Number for [facility] [employer] for shower chair   Patient declines interventions for Food;Housing;Transportation   Time Spent   Time Spent with Patient (min) 45      Social Determinants of Health        Food Insecurity: No Food Insecurity (10/08/2023)    Hunger Vital Sign     Worried About Running Out of Food in the Last Year: Never true     Ran Out of Food in the Last Year: Never true    Date of last entry: 10/08/2023    Housing Stability: Low Risk  (10/08/2023)    Housing Stability Vital Sign     Unable to Pay for Housing in the Last Year: No     Number of Times Moved in the Last Year: 1     Homeless in the Last Year: No    Date of last entry:  10/07/2023   Utilities: Not At Risk (10/07/2023)    AHC Utilities     Threatened with loss of utilities: No    Date of last entry: 10/07/2023   Transportation Needs: No Transportation Needs (10/08/2023)    PRAPARE - Transportation     Lack of Transportation (Medical): No     Lack of Transportation (Non-Medical): No    Date of last entry: 10/08/2023            10/08/2023     3:20 PM   Interventions Accepted   Comment Phone Number for [employer] for shower chair         10/08/2023     3:20 PM   Interventions Declined   Patient declines interventions for [employer]

## 2023-10-08 NOTE — Progress Notes (Signed)
Patient is a/ox4 and able to make needs known. Patient does complain of neck pain/ and spasms so PRN medications given with no issues. Tele monitor on per order. Patient denies needing anything else at this time. Bed is locked in lowest position with call bell with in reach.

## 2023-10-09 ENCOUNTER — Other Ambulatory Visit: Payer: Self-pay | Admitting: Family

## 2023-10-09 ENCOUNTER — Telehealth: Payer: Self-pay | Admitting: Primary Care

## 2023-10-09 ENCOUNTER — Observation Stay: Admit: 2023-10-09 | Discharge: 2023-10-09 | Disposition: A | Discharge status: Home / Self Care | Payer: Medicare Other

## 2023-10-09 DIAGNOSIS — F32A Depression, unspecified: Secondary | ICD-10-CM

## 2023-10-09 DIAGNOSIS — R55 Syncope and collapse: Secondary | ICD-10-CM

## 2023-10-09 DIAGNOSIS — I341 Nonrheumatic mitral (valve) prolapse: Secondary | ICD-10-CM

## 2023-10-09 DIAGNOSIS — E785 Hyperlipidemia, unspecified: Secondary | ICD-10-CM

## 2023-10-09 DIAGNOSIS — N181 Chronic kidney disease, stage 1: Secondary | ICD-10-CM

## 2023-10-09 DIAGNOSIS — G629 Polyneuropathy, unspecified: Secondary | ICD-10-CM

## 2023-10-09 DIAGNOSIS — I10 Essential (primary) hypertension: Secondary | ICD-10-CM

## 2023-10-09 DIAGNOSIS — E039 Hypothyroidism, unspecified: Secondary | ICD-10-CM

## 2023-10-09 HISTORY — DX: Essential (primary) hypertension: I10

## 2023-10-09 LAB — DOBUTAMINE STRESS ECHO COMPLETE
Aortic Diameter (mid tubular): 3.2 cm
Aortic Diameter (sino-tubular junction): 3.2 cm
Aortic Diameter (sinus of Valsalva): 3.6 cm
BMI: 27 kg/m2
BP Diastolic: 84 mm[Hg]
BP Systolic: 136 mm[Hg]
BSA: 2.05 m2
Deceleration Time - MV: 273 ms
E/A ratio: 0.89
Heart Rate: 94 {beats}/min
Height: 70 in
IVC Diameter: 1.7 mm
Interventricular Septum Systolic Thickness: 1.5 cm
LA Diameter BSA Index: 1.1 cm/m2
LA Diameter Height Index: 1.3 cm/m
LA Diameter: 2.3 cm
LA Systolic Vol BSA Index: 21.5 mL/m2
LA Systolic Vol Height Index: 24.7 mL/m
LA Systolic Volume: 44 mL
LAA Orifice Area: 14.3 cm2
LV ASE Mass BSA Index: 77.2 g/m2
LV ASE Mass Height 2.7 Index: 33.5 g/m2
LV ASE Mass Height Index: 89 gm/m
LV ASE Mass: 158.2 g
LV CO BSA Index: 3.05 L/min/m2
LV Cardiac Output: 6.25 L/min
LV Diastolic Volume Index: 55.1 mL/m2
LV Posterior Wall Thickness: 1 cm
LV SV - LVOT SV Diff: 10.44 mL
LV SV - LVOT SV Diff: 15.7 %
LV SV BSA Index: 32.4 mL/m2
LV SV Height Index: 37.4 mL/m
LV Septal Thickness: 1.1 cm
LV Stroke Volume: 66.5 mL
LV Systolic Volume Index: 22.7 mL/m2
LV wall/cavity ratio: 0.48
LVED Diameter BSA Index: 2.15 cm/m2
LVED Diameter Height Index: 2.47 cm/m
LVED Diameter: 4.4 cm
LVED Volume BSA Index: 55 mL/m2
LVED Volume BSA Index: 55.1 mL/m2
LVED Volume Height Index: 63.6 mL/m
LVED Volume: 113 mL
LVEF (Volume): 59 %
LVES Diameter BSA Index: 1.12 cm/m2
LVES Diameter Height Index: 1.29 cm/m
LVES Diameter: 2.3 cm
LVES Volume BSA Index: 22.7 mL/m2
LVES Volume BSA Index: 23 mL/m2
LVES Volume Height Index: 26.2 mL/m
LVES Volume: 46.5 mL
LVOT Area (calculated): 4.15 cm2
LVOT Cardiac Index: 2.57 L/min/m2
LVOT Cardiac Output: 5.27 L/min
LVOT Diameter: 2.1 cm
LVOT Diameter: 2.3 cm
LVOT PWD VTI: 13.5 cm
LVOT PWD Velocity (mean): 68 cm/s
LVOT PWD Velocity (peak): 86 cm/s
LVOT SV BSA Index: 27.35 mL/m2
LVOT SV Height Index: 31.5 mL/m
LVOT Stroke Rate (mean): 282.4 mL/s
LVOT Stroke Rate (peak): 357.1 mL/s
LVOT Stroke Volume: 56.06 mL
Left Ventricle Posterior Wall Systolic Thickness: 1.51 cm
MPHR: 171 {beats}/min
MR Regurgitant Fraction (LV SV Mtd): 0.16
MR Regurgitant Volume (LV SV Mtd): 10.4 mL
MV Peak A Velocity: 76.6 cm/s
MV Peak E Velocity: 67.8 cm/s
Mitral Annular E/Ea Vel Ratio: 9.04
Mitral Annular Ea Velocity: 7.5 cm/s
O2 sat peak: 94 %
O2 sat rest: 99 %
Peak DBP: 48 mm[Hg]
Peak HR: 126 {beats}/min
Peak SBP: 83 mm[Hg]
Percent MPHR: 73.7 %
RA Pressure Estimate: 3 mm[Hg]
RA Volume BSA Index: 14.6 mL/m2
RA Volume Height Index: 16.9 mL/m
RA Volume: 30 mL
RPP: 10458 BPM x mmHG
RR Interval: 638.3 ms
RVED Diameter BSA Index: 2 cm/m2
RVED Diameter Height Index: 2.2 cm/m
RVED Diameter: 4 cm
SEM (LVOT Mean) mN-s: 40.05 mN-s
SEM (LVOT peak) mN-s: 50.65 mN-s
Stress Peak Stage: 4.52
Stress duration (min): 13 min
Stress duration (sec): 34 s
Tricuspid Annular S velocity: 20 cm/s
Weight (lbs): 188 [lb_av]
Weight: 3008 [oz_av]

## 2023-10-09 MED ORDER — DOBUTAMINE 1 MG/ML IN DEXTROSE 5% WATER IV SOLN *I*
5.0000 ug/kg/min | INTRAVENOUS | Status: DC
Start: 2023-10-09 — End: 2023-10-10
  Administered 2023-10-09: 5 ug/kg/min via INTRAVENOUS
  Filled 2023-10-09: qty 250

## 2023-10-09 MED ORDER — SODIUM CHLORIDE 0.9 % IV BOLUS *I*
500.0000 mL | Freq: Once | Status: AC
Start: 2023-10-09 — End: 2023-10-09
  Administered 2023-10-09: 500 mL via INTRAVENOUS

## 2023-10-09 MED ORDER — SODIUM CHLORIDE 0.9 % IV SOLN WRAPPED *I*
100.0000 mL | Status: DC
Start: 2023-10-09 — End: 2023-10-10
  Administered 2023-10-09: 100 mL via INTRAVENOUS

## 2023-10-09 MED ORDER — PERFLUTREN LIPID MICROSPHERE 1.1 MG/ML (DEFINITY) IV SUSP *I*
0.7500 mL | INTRAVENOUS | Status: DC | PRN
Start: 2023-10-09 — End: 2023-10-10

## 2023-10-09 MED ORDER — NIFEDIPINE 10 MG PO CAPS *I*
10.0000 mg | ORAL_CAPSULE | Freq: Two times a day (BID) | ORAL | 1 refills | Status: DC
Start: 2023-10-09 — End: 2023-10-23

## 2023-10-09 MED ORDER — FENOFIBRATE 145 MG PO TABS *I*
145.0000 mg | ORAL_TABLET | Freq: Every day | ORAL | 0 refills | Status: DC
Start: 2023-10-10 — End: 2024-03-27

## 2023-10-09 MED ORDER — ATORVASTATIN CALCIUM 40 MG PO TABS *I*
40.0000 mg | ORAL_TABLET | Freq: Every day | ORAL | 3 refills | Status: DC
Start: 2023-10-10 — End: 2024-06-30

## 2023-10-09 MED ORDER — BUPROPION HCL 150 MG PO TB24 *I*
150.0000 mg | ORAL_TABLET | Freq: Every day | ORAL | 0 refills | Status: DC
Start: 2023-10-10 — End: 2023-10-26

## 2023-10-09 MED ORDER — ATROPINE SULFATE 0.1 MG/ML IJ SOLN *I*
0.5000 mg | INTRAMUSCULAR | Status: AC | PRN
Start: 2023-10-09 — End: 2023-10-09
  Filled 2023-10-09: qty 10

## 2023-10-09 NOTE — Progress Notes (Signed)
Patient alert and oriented sitting up in bed eating breakfast. Complaint of head ache, and neck spasms. Medication given per emar. Bilateral eyes with some swelling s/p fall at home. Healing laceration noted on nose. Ice pack given. Cardiac monitor on. Patient up adlib in room. Bed locked and low. Call light and personal belongings within reach.

## 2023-10-09 NOTE — Progress Notes (Signed)
Physical Therapy Evaluation      Discharge recommendation:  Home PT, 24 Hour supervision/assist  Equipment recommendations upon discharge: To be determined  Mobility recommendations for nursing while in hospital: CGAx1    Patient Name: James Oneill  Date of Birth: [birth year]  MRN#: [id number]  Admission Date: 10/06/2023  Date of Service: 10/09/2023    History of present illness:   James Oneill is a 47 y.o. male who presented to [facility] with Syncope and collapse. Patient was referred to Physical Therapy for functional mobility assessment and discharge planning.    Prior physical therapy history for above complaint within the last year: NA    Admitting Diagnosis:   Syncope and collapse       Past Medical History:   Diagnosis Date    Cancer     Depression     MVP (mitral valve prolapse)      Past Surgical History:   Procedure Laterality Date    ELBOW FRACTURE SURGERY      GALLBLADDER SURGERY  2019    HERNIA REPAIR  2019    umbilical    NECK SURGERY Left 2018    radical dissection    TONSILLECTOMY  2017    [facility] Left tonsil       Hospital Course:  MR head without and with contrast    Result Date: 10/08/2023  No acute intracranial abnormality. 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 Hypoplastic versus atelectatic bilateral maxillary sinuses. Findings are nonspecific, but can be seen in the setting of silent sinus syndrome in the appropriate clinical context. END OF IMPRESSION       [facility] submits this DICOM format image data and final report to the [facility], an independent secure electronic health information exchange, on a reciprocally searchable basis (with patient authorization) for a minimum of 12 months after exam date.    US doppler renal artery and vein    Result Date: 10/08/2023  No evidence of renal artery stenosis. No hydronephrosis or calculi. END OF IMPRESSION       [facility] submits this DICOM format image data and final report to the  [facility], an independent secure electronic health information exchange, on a reciprocally searchable basis (with patient authorization) for a minimum of 12 months after exam date.    CT angio chest    Result Date: 10/07/2023  No pulmonary embolism. No acute cardiopulmonary findings. END OF IMPRESSION I have personally reviewed the images and the Resident's/Fellow's interpretation and agree with or edited the findings.       [facility] submits this DICOM format image data and final report to the [facility], an independent secure electronic health information exchange, on a reciprocally searchable basis (with patient authorization) for a minimum of 12 months after exam date.    EKG 12 lead (initial)    Result Date: 10/06/2023  Sinus rhythm Borderline  T abnormalities, inferior leads        ECHO COMPLETE 07/05/2022    Interpretation Summary  Normal LV function  Bileaflet prolapse of the mitral valve with mild regurgitation  No right to left shunting with bubble injection    SUBJECTIVE  Patient reports that he is doing okay but concerned about his symptoms.     OBJECTIVE/SOCIAL HISTORY    Height: 177.8 cm (5\' 10" )  Weight: 85.3 kg (188 lb)     PT Adult Assessment - 10/09/23 5409  Prior Living     Prior Living Situation Reported by patient     Lives With Family   mother    Type of Home [place]     # Steps to Enter Home 5     # Rails to Enter Home 1     Location of Bedrooms 1st floor     Location of Bathrooms 1st floor     Bathroom Accessibility Accessible;Tub/Shower unit     Current Home Equipment None        Prior Function Level    Prior Function Level Reported by patient     Transfers Independent     Transfer Devices None     Walking Independent     Walking assistive devices used None     Stair negotiation Independent     Additional Comments struggling more        PT Tracking    PT TRACKING PT Assigned     Type of Session evaluation        Precautions/Observations    Precautions used Yes     Was  patient wearing a mask? No     PPE worn by Clinical research associate [facility]     Fall Precautions General falls precautions        Current Pain Assessment    Pain Assessment / Reassessment Assessment     Pain Scale 0-10 (Numeric Scale for Pain Intensity)      0-10 Scale 5     Pain Location/Orientation Back;Face;Head;Neck     Pain Descriptors Constant;Discomfort     No Intervention/MAR Intervention(s) No Intervention required        Vision     Current Vision Adequate for PT session        Cognition    Cognition Tested     Arousal/Alertness Appropriate responses to stimuli     Orientation Alert and oriented x3     Ability to Follow Instructions Follows all commands and directions without difficulty        UE Assessment    Assessment Focus Strength        LUE Strength    Overall Strength Deficits        RUE Strength    Overall Strength WFL assessed within functional activities        LE Assessment    Assessment Focus Strength        LLE Strength    Overall Strength WFL assessed within functional activities;Deficits        RLE Strength    Overall Strength WFL assessed within functional activities;Deficits        Sensation    Sensation No apparent deficit        Bed Mobility    Bed mobility Tested     Supine to Sit Modified independent (device)     Sit to Supine Modified independent (device)        Transfers    Transfers Tested     Stand Pivot Transfers Contact guard     Sit to Stand Contact guard     Stand to sit Contact guard     Transfer Assistive Device none        Mobility    Mobility Tested     Gait Pattern Decreased cadence;Decreased R step height;Decreased L step height     Ambulation Assist Contact guard     Ambulation Distance (Feet) 45     Ambulation Assistive Device None        Family/Caregiver Training`  Patient/Family/Caregiver training Yes     Patient training Role of physical therapy in hospital and plan for evaluation and follow up;PT plan of care after evaluation;Discharge planning        Balance    Balance Tested      Sitting - Static Independent     Sitting - Dynamic Independent     Standing - Static Contact guard     Standing - Dynamic Min assist        Functional Outcome Measures    Functional Outcome Measures Yes        PT AM-PAC Mobility    Turning over in bed? None: Modified Independence/independent     Moving from lying on back to sitting on the side of the bed? None: Modified Independence/independent     Moving to and from a bed to a chair? A Little: Minimum/Contact Guard Assist/Supervision     Sitting down on and standing up from a chair with arms? None: Modified Independence/independent     Need to walk in hospital room? A Little: Minimum/Contact Guard Assist/Supervision     Climbing 3 - 5 steps with a railing? A Little: Minimum/Contact Guard Assist/Supervision     Total Raw Score 21     Standardized Score - Calculated 50.25     % Functional Impairment - Calculated 29%        Assessment    Brief Assessment Appropriate for skilled therapy     Problem List Impaired LE strength;Impaired UE strength;Impaired ambulation;Impaired functional mobility;Impaired endurance;Impaired balance;Impaired coordination     Patient / Family Goal to feel better     Overall Assessment Patient is below his baseline level of function at this time demonstrating impaired balance, diminished strength and functional ability. At this time recommend.home PT w/24hr assist/supervision as he remains a high fall risk.        Plan/Recommendation    PT Treatment Interventions Assess functional mobility;D/C planning;Restorative PT     PT Frequency 2-4x/wk     PT Mobility Recommendations CGAx1     PT Referral Recommendations OT     PT Discharge Recommendations Home PT;24 Hour supervision/assist     PT Discharge Equipment Recommended To be determined     PT Assessment/Recommendations Reviewed With: Nursing;Occupational Therapy;Patient;Physician;Care coordinator        Time Calculation    PT Timed Codes 0     PT Untimed Codes 1     PT Unbilled Time 0      PT Total Treatment 1        Plan and Onset date    Plan of Care Date 10/09/23     Onset Date 10/06/23     Treatment Start Date 10/09/23                       ASSESSMENT:  James Oneill is a 47 y.o. male who has been admitted to the hospital with Syncope and collapse.  He has been referred to PT for Patient/Family/Caregiver Education and Training and Discharge Planning Recommendations. Patient presents to PT currently requiring CGAx1.  Patient will benefit from skilled PT services to address above stated impairments with above stated interventions.     Personal factors affecting treatment/recovery:  History of falls    Comorbidities affecting treatment/recovery for this admission (*see bolded below):  Past Medical History:   Diagnosis Date    Cancer     Depression     MVP (mitral valve prolapse)      Clinical  presentation:  evolving    Patient complexity:    moderate level as indicated by above stability of condition, personal factors, environmental factors and comorbidities in addition to their impairments found on physical exam.    PT Prognosis:good    Patient / Caregiver Understanding: good    Long term goals: Patient will be independent with all ADL's in four weeks.     PT Care Plan Notes       No notes found.              PLAN   (see flow sheet above for components of plan)    Plan of Care     The physician's co-signature on this note indicates that they have reviewed this evaluation and agree with the documented goals and plan of care.    Thank you for the referral.    Herbert Deaner, PT,DPT

## 2023-10-09 NOTE — Progress Notes (Signed)
Patient Name: James Oneill Date of Admission: 10/06/2023   DOB: 1976/09/17 Primary Care Physician: Eustace Quail, MD   Patient MR#: [id number]  Attending Physician: Caron Presume, MD      [facility] Progress Note     Interval history     Date of service: 10/09/23   LOS: 0 days     James Oneill is a 47 y.o. 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    Subjective     Patient seen and examined at bedside.   Being taken to stress test.  No acute overnight events.  Probable discharge today.      he otherwise denies fever, chills, headache, chest pain, SOB, abdominal pain, nausea, vomiting, diarrhea, constipation, or weakness.      Objective     Vitals:  Vitals:    10/09/23 0000 10/09/23 0359 10/09/23 0503 10/09/23 0800   BP:  94/59  126/79   Pulse:  73  74   Resp: 18 17 16 16    Temp:  35.6 C (96 F)  36.2 C (97.1 F)   TempSrc:  Temporal  Temporal   SpO2:  95%  95%   Weight:       Height:         Oxygen Requirement:        Ins/Outs:     Intake/Output Summary (Last 24 hours) at 10/09/2023 0825  Last data filed at 10/08/2023 1755  Gross per 24 hour   Intake 480 ml   Output --   Net 480 ml     Weight:  Wt Readings from Last 3 Encounters:   10/08/23 85.3 kg (188 lb)   10/05/23 83.9 kg (185 lb)   10/05/23 85.6 kg (188 lb 12.8 oz)     Physical Exam by Systems:  General: Not in acute distress. Conversant and able to make needs known.  HEENT: Normocephalic, atraumatic. PERRLA. EOMI. No lesions on nose or ears. MMM  NECK: Supple, trachea is in midline, no palpable lymphadenopathy.  CVS: S1 S2 heard, RRR, no appreciable murmurs/rubs/gallops  PULM: normal effort, CTAB, with no appreciable wheezes, rales, or rhonchi  ABD: Bowel sounds active,  soft, non tender, not distended. No guarding rigidity or rebound tenderness.   EXT: No clubbing, cyanosis, or edema. Distal pulses 2+  NEURO: A and O to person, place, time and current events. Cranial nerves II-XII are grossly intact.  Sensations intact. Strength 5/5 in bilateral upper and lower extremities.  MSK: No joint swelling or tenderness. ROM intact throughout.    SKIN: Warm, dry and intact with no rashes or ulcers.   PSYCH: Appropriate mood and affect.    Foley: no    Active Hospital Medications     Scheduled Meds    NIFEdipine  10 mg Oral BID    buPROPion  150 mg Oral Daily    pantoprazole  40 mg Oral QAM    FLUoxetine  40 mg Oral Daily    levothyroxine  25 mcg Oral Daily @ 0600    enoxaparin  40 mg Subcutaneous Daily @ 2100    fenofibrate  144 mg Oral Daily    atorvastatin  40 mg Oral Daily     PRN Meds   HYDROcodone-acetaminophen  1 tablet Oral Q6H PRN    acetaminophen  650 mg  Oral Q6H PRN    cyclobenzaprine  10 mg Oral TID PRN    gabapentin  600 mg Oral TID PRN     Continuous Infusions      Labs     CBC BMP   Recent Labs   Lab 10/07/23  0005 10/05/23  1525   WBC 4.8 5.9   Hemoglobin 13.8 14.6   Hematocrit 39 41   Platelets 246 301        Recent Labs   Lab 10/08/23  0434 10/07/23  0005 10/05/23  1525   Sodium 138 140 138   Potassium 4.1 3.8 4.2   Chloride 103 103 100   CO2 25 29* 30*   UN 16 18 21*   Creatinine 1.36* 1.34* 1.32*   Glucose 97 123* 97   Calcium 9.7 9.4 10.2   Albumin  --   --  5.2   Phosphorus 3.0 2.8  --         Coags LFTs   Recent Labs   Lab 10/05/23  1525   INR 1.0   Protime 11.3        Recent Labs   Lab 10/05/23  1525   Alk Phos 47   Bilirubin,Total 1.0   Albumin 5.2   ALT 43   AST 34   Total Protein 7.7        Cardiac Enzymes Other   No results for input(s): "CKTS", "TROPU", "TROP", "MCKMB", "CKMB" in the last 168 hours.    No components found with this basename: "RICKMBS"   ABG: No results for input(s): "APH", "APCO2", "APO2", "AOSAT", "ABE", "LAC", "CO" in the last 168 hours.      Microbiology results           Radiology Results   MR head without and with contrast    Result Date: 10/08/2023  No acute intracranial abnormality. 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 Hypoplastic versus atelectatic bilateral maxillary sinuses. Findings are nonspecific, but can be seen in the setting of silent sinus syndrome in the appropriate clinical context. END OF IMPRESSION       [facility] submits this DICOM format image data and final report to the [facility], an independent secure electronic health information exchange, on a reciprocally searchable basis (with patient authorization) for a minimum of 12 months after exam date.    US doppler renal artery and vein    Result Date: 10/08/2023  No evidence of renal artery stenosis. No hydronephrosis or calculi. END OF IMPRESSION       [facility] submits this DICOM format image data and final report to the [facility], an independent secure electronic health information exchange, on a reciprocally searchable basis (with patient authorization) for a minimum of 12 months after exam date.    CT angio chest    Result Date: 10/07/2023  No pulmonary embolism. No acute cardiopulmonary findings. END OF IMPRESSION I have personally reviewed the images and the Resident's/Fellow's interpretation and agree with or edited the findings.       [facility] Imaging submits this DICOM format image data and final report to the [facility], an independent secure electronic health information exchange, on a reciprocally searchable basis (with patient authorization) for a minimum of 12 months after exam date.    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 male with past medical history significant for tonsillar cancer s/p radical neck dissection, hypertension, mitral valve prolapse, dyslipidemia, hypothyroidism, and depression who presents to [facility] with syncopal episode and facial laceration.     Syncopal episode, recurrent   Hx mitral valve prolapse  Orthostatic Hypotension  -telemetry monitoring, fall precautions  -last echo 06/2022 showed Bileaflet prolapse of the mitral valve with mild regurgitation   -EKG shows t wave inversions and q waves in inferior leads but unchanged from prior ekgs   -Positive orthostats: laying 141/91 HR 61, standing 98/61 HR 81  -Discontinued home med metoprolol   -Dobutamine stress echo today    Chronic Kidney Disease; unknown etiology  -BUN/Cr on admission 21/1.32; baseline since 12/2022 Cr has been elevated 1.28  -Pt unaware he had CKD  -Urine protein/Cr ratio <6 not likely nephropathy  -UA completely normal; negative for blood, glu, protein, ketones  -Bilateral renal US negative for renal artery stenosis     Hx right frontal lobe cavernoma   -CT head without contrast shows stable hyperdensity within right frontal lobe  -last MRI was in 06/2022 showed 0.7cm posterior right frontal cavernoma  -MRI shows unchanged right frontal cavernous malformation associated developmental venous anomaly.    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   DVT prophylaxis: Lovenox 40 mg nightly     Code Status: Full Code     DISPOSITION:  Dobutamine stress echo today. MRI head and bilateral renal US not concerning. Possible discharge today.     Total care time > 35 minutes today, > 50% spent counseling and coordinating care. Case discussed with APP, RN, case management, pharmacy.    Signed:   Caron Presume, MD   10/09/2023  8:25 AM

## 2023-10-09 NOTE — Telephone Encounter (Signed)
Patient was admitted to:  [facility]    Date of Admission: 12/22    Admitting Dx: Syncope and collapse    Date of Discharge: 12/24    Is there a VNA referral: N/A    Patient is scheduled at our office on 12/27 at 11:15a with Dr. Dwaine Gale for a discharge follow-up.      **Remember to have discharge report(s) faxed to our office prior to patient's appointment**      *Route message to Delman Cheadle, [place], [address] Nurse [address], & [employer] (RN) as a HIGH priority**

## 2023-10-09 NOTE — Continuity of Care (Signed)
Patient was evaluated by Physical and Occupational therapy today and they are recommending home therapy. In to speak to patient. He is agreeable to home therapy. Provider's for home therapy discussed with patient and he would like the VNA for his home therapy. Referral sent to the VNA. VNA has accepted patient. VNA phone number placed on discharge instructions.

## 2023-10-09 NOTE — Discharge Summary (Signed)
Name: James Oneill MRN: [id number] DOB: 1975/11/09     Admit Date: 10/06/2023       Patient was accepted for discharge to   Home or Self Care [1]              Hospitalization Summary                 Patient Name: James Oneill Date of Admission: 10/06/2023   DOB: 01-01-1976 Date of Discharge: 10/09/2023   Patient MR#: [id number] Admitting Physician: Caron Presume, MD   Primary Care Physician: Eustace Quail, MD  Discharge Physician: Caron Presume, MD       [facility] Discharge Summary     Discharge Diagnoses     Syncope, orthostatic hypotension, mitral valve prolapse, frontal lobe cavernoma, CKD      Active Hospital Problems    Diagnosis     *!*Syncope and collapse     Dyslipidemia     Hypothyroid     Hypertension     Neuropathy      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  Patient required stitches which are similar to the emergency department and then he was admitted to the MedSurg unit for syncope workup.    Management was conducted as documented below.    Syncopal episode, recurrent   Hx mitral valve prolapse  Orthostatic Hypotension  -telemetry monitoring, fall precautions  -last echo 06/2022 showed Bileaflet prolapse of the mitral valve with mild regurgitation   -EKG shows t wave inversions and q waves in inferior leads but unchanged from prior ekgs   -Positive orthostats: laying 141/91 HR 61, standing 98/61 HR 81  -Discontinued home med metoprolol   -Dobutamine stress echo did not show any wall motion abnormalities or acute concerning findings.  Patient will need to follow-up with  cardiology regarding his mitral valve prolapse.  Heart was a bit hyperdynamic and he became hypotensive during study so he received 500 cc normal saline bolus prior to discharge.     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  Home physical therapy to prevent any further deterioration.     Hypothyroidism  -likely subclinical based on historic T4 levels  -continue home levothyroxine 25 mcg daily     Depression  -home fluoxetine 40 mg daily  -added Wellbutrin    Procedures     None    Consults     None    Discharge Physical Exam     Vitals:  BP 131/88   Pulse 74   Temp 36.2 C (97.1 F) (Temporal)   Resp 16   Ht 1.778 m (5'  10")   Wt 85.3 kg (188 lb)   SpO2 95%   BMI 26.98 kg/m   Oxygen Requirement:        Weight:  Wt Readings from Last 3 Encounters:   10/08/23 85.3 kg (188 lb)   10/09/23 85.3 kg (188 lb)   10/05/23 83.9 kg (185 lb)     Physical Exam by Systems:  General: Not in acute distress. Conversant and able to make needs known.  HEENT: Normocephalic, atraumatic. PERRLA. EOMI. No lesions on nose or ears. MMM  NECK: Supple, trachea is in midline, no palpable lymphadenopathy.  CVS: S1 S2 heard, RRR, no appreciable murmurs/rubs/gallops  PULM: normal effort, CTAB, with no appreciable wheezes, rales, or rhonchi  ABD: Bowel sounds active, soft, non tender, not distended. No guarding rigidity or rebound tenderness.   EXT: No clubbing, cyanosis, or edema. Distal pulses 2+  NEURO: A and O to person, place, time and current events. Cranial nerves II-XII are grossly intact.  Sensations intact. Strength 5/5 in bilateral upper and lower extremities.  MSK: No joint swelling or tenderness. ROM intact throughout.    SKIN: Warm, dry and intact with no rashes or ulcers.   PSYCH: Appropriate mood and affect.    Foley: no    Labs     CBC BMP   Recent Labs   Lab 10/07/23  0005 10/05/23  1525   WBC 4.8 5.9   Hemoglobin 13.8 14.6   Hematocrit 39 41   Platelets 246 301        Recent Labs   Lab 10/08/23  0434 10/07/23  0005 10/05/23  1525   Sodium 138 140 138   Potassium 4.1 3.8 4.2   Chloride 103 103 100   CO2 25 29* 30*   UN 16 18 21*   Creatinine 1.36* 1.34* 1.32*   Glucose 97 123* 97   Calcium 9.7 9.4 10.2   Albumin  --   --  5.2   Phosphorus 3.0 2.8  --         Coags LFTs   Recent Labs   Lab 10/05/23  1525   INR 1.0   Protime 11.3        Recent Labs   Lab 10/05/23  1525   Alk Phos 47   Bilirubin,Total 1.0   Albumin 5.2   ALT 43   AST 34   Total Protein 7.7        Cardiac Enzymes Other   No results for input(s): "CKTS", "TROPU", "TROP", "MCKMB", "CKMB" in the last 168 hours.    No components found with this basename: "RICKMBS"   ABG: No results  for input(s): "APH", "APCO2", "APO2", "AOSAT", "  ABE", "LAC", "CO" in the last 168 hours.     Microbiology results           Radiology Reports   MR head without and with contrast    Result Date: 10/08/2023  No acute intracranial abnormality. 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 Hypoplastic versus atelectatic bilateral maxillary sinuses. Findings are nonspecific, but can be seen in the setting of silent sinus syndrome in the appropriate clinical context. END OF IMPRESSION       [facility] submits this DICOM format image data and final report to the [facility], an independent secure electronic health information exchange, on a reciprocally searchable basis (with patient authorization) for a minimum of 12 months after exam date.    US doppler renal artery and vein    Result Date: 10/08/2023  No evidence of renal artery stenosis. No hydronephrosis or calculi. END OF IMPRESSION       [facility] submits this DICOM format image data and final report to the [facility], an independent secure electronic health information exchange, on a reciprocally searchable basis (with patient authorization) for a minimum of 12 months after exam date.    CT angio chest    Result Date: 10/07/2023  No pulmonary embolism. No acute cardiopulmonary findings. END OF IMPRESSION I have personally reviewed the images and the Resident's/Fellow's interpretation and agree with or edited the findings.       [facility] submits this DICOM format image data and final report to the [facility], an independent secure electronic health information exchange, on a reciprocally searchable basis (with patient authorization) for a minimum of 12 months after exam date.    EKG 12 lead (initial)    Result Date: 10/06/2023  Sinus rhythm Borderline  T abnormalities, inferior leads   Discharge Medications     Current Discharge Medication List        START taking these medications    Details AM Noon PM  Bedtime   buPROPion (WELLBUTRIN XL) 150 mg 24 hr tablet Take 1 tablet (150 mg total) by mouth daily. Swallow whole. Do not crush, break, or chew.  Qty: 30 tablet, Refills: 0              NIFEdipine (PROCARDIA) 10 MG capsule Take 1 capsule (10 mg total) by mouth 2 times daily.  Qty: 60 capsule, Refills: 1                   CONTINUE these medications which have CHANGED    Details AM Noon PM Bedtime   atorvastatin (LIPITOR) 40 mg tablet Take 1 tablet (40 mg total) by mouth daily.  Qty: 90 tablet, Refills: 3              fenofibrate (TRICOR) 145 mg tablet Take 1 tablet (145 mg total) by mouth daily.  Qty: 30 tablet, Refills: 0                   CONTINUE these medications which have NOT CHANGED    Details AM Noon PM Bedtime   cyclobenzaprine (FLEXERIL) 5 mg tablet Take 1 tablet (5 mg total) by mouth 3 times daily as needed for Muscle spasms.  Qty: 30 tablet, Refills: 2              FLUoxetine (PROZAC) 40 mg capsule Take 1 capsule (40 mg total) by mouth daily.  Qty: 30 capsule, Refills: 2  HYDROcodone-acetaminophen (NORCO) 5-325 mg per tablet Take 1 tablet by mouth every 6 hours as needed for Pain. Max daily dose: 4 tablets  Qty: 28 tablet, Refills: 0              levothyroxine (SYNTHROID, LEVOTHROID) 25 mcg tablet TAKE 1 TABLET BY MOUTH EVERY DAY BEFORE BREAKFAST  Qty: 30 tablet, Refills: 2              esomeprazole magnesium (NEXIUM) 40 mg capsule Take 1 capsule (40 mg total) by mouth daily (before breakfast).  Qty: 90 capsule, Refills: 2              gabapentin 600 mg tablet Take 1 tablet (600 mg total) by mouth 3 times daily.  Qty: 270 tablet, Refills: 2              mupirocin (BACTROBAN) 2 % ointment Apply topically 3 times daily for nasal vestibulitis. to the following a2reas: nasal vestibule  Qty: 22 g, Refills: 3              fluticasone (FLONASE) 50 MCG/ACT nasal spray Spray 1 spray into nostril daily  Qty: 16 g, Refills: 5        Associated Diagnoses: Sinusitis, unspecified chronicity, unspecified  location                   STOP taking these medications       metoprolol succinate ER (TOPROL-XL) 25 mg 24 hr tablet Comments:   Reason for Stopping:         losartan (COZAAR) 50 mg tablet Comments:   Reason for Stopping:         metoprolol succinate ER (TOPROL-XL) 25 mg 24 hr tablet Comments:   Reason for Stopping:             Discharge Instructions     Follow up with your PCP within 7-10 days of discharge.  Notify your PCP for further medication refills.  Please sign up with [employer] app to have access to your medical information.    Discharge Activity:  Home physical and occupational therapies    Discharge Diet:  Regular Diet    Complications:  Call your PCP's office if you are experiencing any of the following: chest pain, shortness of breath, acute abdominal pain, no BM>4 days, unable to void, fever/chills, any prolonged bleeding if on blood thinner medication      Total care time > 35 minutes today, > 50% spent counseling and coordinating care.    Signed:  Caron Presume, MD   10/09/2023   1:23 PM                                         Signed: Caron Presume, MD  On: 10/09/2023  at: 1:20 PM

## 2023-10-09 NOTE — Progress Notes (Addendum)
Occupational Therapy Evaluation    Discharge recommendation:   (Home OT (if he is able to have safe and willing assistance at home 24 hours) vs short term rehab if he is unable to have assistance at home that is safe.)   Equipment recommendations:  (shower chair and grab bars in the shower) grab bars need to go into a stud in the wall  Hospital Stay Recommendations for nursing: Assist with ADL tasks    Patient Name: James Oneill  Date of Birth: [birth year]  MRN#: [id number]  Admission Date: 10/06/2023  Date of Service: 10/09/2023    Initial Eval Completed. Patient is a 47 y.o.male who was admitted for Syncope and collapse. Pt was referred to OT for evaluation and tx to assess independence with ADL tasks and assist with discharge planning. Pt currently presents with impaired ADLs, transfers. Based on AM-PAC scoring, pt is at a 53.32% functional impairment for ADLs and requires CGA for functional transfers and mobility. Pt lives with his mother who he states can assist with daily tasks. He was educated on safety with transfers today changing position slowly, waiting when feeling dizzy seated until sx go away, having assistance with transfers at home. Not proceeding to ambulate when feeling dizzy.  Pt will benefit from restorative OT services in the acute care setting to max safety and functional independence with ADLs to achieve PLOF and ensure safe discharge planning.     Treatment today consisted of: Occupational therapy evaluation     Recommendations for discharge include: Short term rehab vs Home OT with 24 hour assistance     OT Prognosis: good    Long term goal: patient will complete lower body dressing with mod I within 4 visits    Short term goal: patient will complete bathing tasks with mod I within 2 visits      Admitting Diagnosis: Syncope and collapse    Reason for Referral: Decline in Function and [facility] Course of Testing & Treatment:   MR head without and with contrast    Result Date:  10/08/2023  No acute intracranial abnormality. 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 Hypoplastic versus atelectatic bilateral maxillary sinuses. Findings are nonspecific, but can be seen in the setting of silent sinus syndrome in the appropriate clinical context. END OF IMPRESSION       [facility] submits this DICOM format image data and final report to the [facility], an independent secure electronic health information exchange, on a reciprocally searchable basis (with patient authorization) for a minimum of 12 months after exam date.    US doppler renal artery and vein    Result Date: 10/08/2023  No evidence of renal artery stenosis. No hydronephrosis or calculi. END OF IMPRESSION       [facility] Imaging submits this DICOM format image data and final report to the [name], an independent secure electronic health information exchange, on a reciprocally searchable basis (with patient authorization) for a minimum of 12 months after exam date.    CT angio chest    Result Date: 10/07/2023  No pulmonary embolism. No acute cardiopulmonary findings. END OF IMPRESSION I have personally reviewed the images and the Resident's/Fellow's interpretation and agree with or edited the findings.       UR Imaging submits this DICOM format image data and final report to the [facility], an independent secure electronic health information exchange, on a reciprocally searchable basis (with patient authorization) for a minimum  of 12 months after exam date.    EKG 12 lead (initial)    Result Date: 10/06/2023  Sinus rhythm Borderline  T abnormalities, inferior leads        ECHO COMPLETE 07/05/2022    Interpretation Summary  Normal LV function  Bileaflet prolapse of the mitral valve with mild regurgitation  No right to left shunting with bubble injection    Past Medical History:   Diagnosis Date    Cancer     Depression     MVP (mitral valve prolapse)      Past Surgical History:    Procedure Laterality Date    ELBOW FRACTURE SURGERY      GALLBLADDER SURGERY  2019    HERNIA REPAIR  2019    umbilical    NECK SURGERY Left 2018    radical dissection    TONSILLECTOMY  2017    SCC Left tonsil       *Bold Indicates co-morbidities affecting treatment and recovery    Occupational profile relating to the present problem:  Recent hospital admission  Needs assistance for mobility    Patient complexity:  low level as indicated by  personal factors, environmental factors and comorbidities in addition to their impairments found on physical exam.       OT Assessment (HH / URR) - 10/09/23 0900          OT Tracking  (HH / URR)    OT Tracking (HH / URR) OT Assigned     Type of Session evaluation        Precautions    Precautions used Yes     Fall Precautions General falls precautions     Writer wearing PPE including Gloves;Mask     Activity Order Activity as tolerated        Home Living (Prior to Admission)    Prior Living Situation Reported by patient     Type of Home --   single family home with 5 steps to enter    [employer] Tub/Shower unit        Prior Function    Prior Function Reported by patient     Level of Independence Needs assistance with ADLs;Needs assistance with homemaking     Lives With Family     Receives Help From Family     IADL Needs assistance        Vision     Current Vision No visual deficits        Cognition    Cognition No deficit noted        UE Assessment    UE Assessment --   impaired ROM LUE, RUE with WFL       Bed Mobility    Rolling for Self Care Supervision     Supine to Sit Supervision     Sit to Supine Contact Guard     Supine to Long Sit Contact Guard        Functional Transfers    Stand pivot transfer Contact Guard     Sit to Stand Contact guard     Stand to Sit Contact guard     Toilet Transfers Contact guard     Shower Transfers Contact guard     Functional Mobility Contact Guard        Balance    Sitting - Static Supervision     Sitting - Dynamic Contact Guard      Standing - Forensic psychologist Guard     Standing -  Dynamic Contact Guard        ADL Assessment    Eating Set up     Oral Care Set up     Grooming Set up     UE Dressing Contact guard     LE Dressing Moderate assist     Bathing Moderate assist     Toileting Moderate assist        IADL Assessment    IADL Needs assistance        Activity Tolerance    Endurance Tolerates < 10 Min activity, no significant change in vital signs        Functional Outcomes Measures    Functional Outcome Measures Yes        OT AM-PAC Self Care    Putting on and taking off regular lower body clothing? 2     Bathing (including washing, rinsing, drying)? 2     Toileting, which includes using toilet, bedpan, or urinal? 2     Putting on and taking off regular upper body clothing? 3     Taking care of personal grooming such as brushing teeth? 3     Eating Meals 4     Total Raw Score 16     CMS Score - Calculated 53.32%        Assessment    Assessment Impaired ADL status;Impaired UE strength;Impaired Safe judgement during ADL;Impaired balance;Impaired endurance;Impaired instrumental ADL's;Impaired self-care transfers        OT Treatment Assessment    Assessment --   short term rehab vs Home OT with assistance 24 hours from aid services or family       Plan    OT Frequency 3-5x/wk     Patient Will Benefit From ADL retraining;Functional transfer training;UE strengthening/ROM;Endurance training;IADL training;Exercises        Recommendation    OT Discharge Recommendations --   Home OT (if he is able to have safe and willing assistance at home 24 hours) vs short term rehab if he is unable to have assistance at home that is safe.    OT Discharge Equipment Recommended --   shower chair and grab bars in the shower    OT Hospital Stay Recommendations Assist with ADL tasks        Multidisciplinary Communication    Multidisciplinary Communication PT Ryan        Patient/Family/Caregiver Training    Patient/Family/Caregiver training Yes     Patient/Family/Caregiver  training Role of occupational therapy in hospital and plan for evaluation;Discharge planning;OT plan of care after evaluation;Call don't fall, purpose of bed/chair alarm, and recommendations for nursing        Time Calculation    OT Untimed Codes 1     OT Total Treatment 1                        Thank you for your referral.   Oval Linsey, OTR/L    Plan of Care     The physician's co-signature on this note indicates that they have reviewed this evaluation and agree with the documented goals and plan of care.

## 2023-10-09 NOTE — Progress Notes (Signed)
Patient cleared for discharge. All discharge instructions complete verbally and written. Patient and mother state understanding regarding medication and and follow up. IV lines removed. All patient belongings sent home with patient. Patient stable upon discharge. Patient off floor.

## 2023-10-09 NOTE — Discharge Instructions (Addendum)
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  You required stitches which was done in the emergency department.  You were admitted to the hospital for further workup and we felt that you passed out from orthostatic hypotension.  Your blood pressure dropped significantly from a laying position to a standing position.  Your blood pressure meds have been adjusted and you were started on nifedipine 10 mg twice daily for your blood pressure.  We also did an MRI of your brain which showed a stable venous malformation called cavernoma.  You had a dobutamine stress test to further evaluate your heart because you have a history of mitral valve prolapse.  Your test was negative for any acute cardiac problems but you will still need to follow-up with your cardiologist regarding your mitral valve prolapse.  You have been provided with education regarding minimizing your level of activity and an excuse note has been written for you as well.  We noticed that your kidney levels have been elevated for about 1 year which means you have chronic kidney disease also known as CKD.  We did a renal ultrasound and did not find any abnormalities.  Your urine analysis was negative for protein or blood.  Etiology is unknown at this time as to the cause of your kidney disease but it has remained stable during your stay.  We sent a referral to a nephrologist for you to follow-up with for further workup and management.    Take  your medications as prescribed.    Follow up with your PCP within 7-10 days of discharge.  Notify your PCP for further medication refills.  Please sign up with [employer] app to have access to your medical information.      Discharge Activity:  Home physical and occupational therapies    Discharge Diet:  Regular Diet    Complications:  Call your PCP's office if you are experiencing any of the following: chest pain, shortness of breath, acute abdominal pain, no BM>4 days, unable to void, fever/chills, any prolonged bleeding if on blood thinner medication    [facility] call the [facility] when you get home to schedule your home Physical and Occupational therapy appointment. [phone]

## 2023-10-11 ENCOUNTER — Telehealth: Payer: Self-pay

## 2023-10-11 NOTE — Telephone Encounter (Signed)
Called home/mobile [phone] and spoke to Jonny Ruiz and scheduled appointment for 03/14/24 at 11:00 am with Dr Jerrilyn Cairo at [facility]. Patient stated that he does use mychart and UR lab. Will need to send mychart message to patient closer to the appointment date. Will also send message to the nurse to have labs added to chart

## 2023-10-12 ENCOUNTER — Other Ambulatory Visit: Payer: Self-pay

## 2023-10-12 ENCOUNTER — Encounter: Payer: Self-pay | Admitting: Primary Care

## 2023-10-12 ENCOUNTER — Ambulatory Visit: Payer: Medicare Other | Admitting: Primary Care

## 2023-10-12 VITALS — BP 140/100 | HR 118 | Temp 97.4°F | Ht 70.0 in | Wt 191.2 lb

## 2023-10-12 DIAGNOSIS — E785 Hyperlipidemia, unspecified: Secondary | ICD-10-CM

## 2023-10-12 DIAGNOSIS — R0989 Other specified symptoms and signs involving the circulatory and respiratory systems: Secondary | ICD-10-CM

## 2023-10-12 DIAGNOSIS — I169 Hypertensive crisis, unspecified: Secondary | ICD-10-CM

## 2023-10-12 DIAGNOSIS — R55 Syncope and collapse: Secondary | ICD-10-CM

## 2023-10-12 MED ORDER — NON-SYSTEM MEDICATION *A*
0 refills | Status: AC
Start: 2023-10-12 — End: ?

## 2023-10-12 NOTE — Progress Notes (Addendum)
Patient ID: James Oneill is a 47 y.o. year old male.    Chief Complaint   Patient presents with    Follow-up     Hospital for syncope     HPI:  This is a TCM visit  James Oneill was admitted to [facility] following a fall at home.  He sustained lacerations to his face.  He has had several syncopal episodes over the last few months.  In retrospect every single one has been caused by him getting up from lying or sitting.  On this occasion he remembers getting up out of his bed to let the dog out he felt dizzy before he blacked out and woke up on the floor.  On no occasion has he lost control of his bowel or bladder.  At his most recent episode when he woke up on the floor he was able to call his mother for assistance    His blood pressure is somewhat labile.  He has a history of atypical chest pain and tachycardia unrelated to postural changes    Stress echocardiogram was negative for ischemia    He is depressed.  I think this has been worse recently as the DSS want to cut off his disability.  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  He follows annually with ENT    He was started on fenofibrate for elevated triglycerides  His metoprolol and losartan were discontinued.  He was started on slow release nifedipine 20 mg.  However this caused hypotension and he was switched to nifedipine 10 mg twice a day which he is tolerating thus far    He needs to follow-up with cardiology.  Referral was made to nephrology for stage III chronic kidney disease        The medication and allergy list was reviewed and is accurate  Allergy / Social History / Medications:  Allergies   Allergen Reactions    Shellfish-Derived Products Hives     Social History     Tobacco Use    Smoking status: Never    Smokeless tobacco: Never   Substance Use Topics    Alcohol use: Yes     Alcohol/week: 1.0 standard drink of alcohol     Types: 1  Glasses of wine per week     Patient's Medications   New Prescriptions    NON-SYSTEM MEDICATION    Medication/Supply: below knee compression stockings  Directions for Use: remove at night   Previous Medications    ATORVASTATIN (LIPITOR) 40 MG TABLET    Take 1 tablet (40 mg total) by mouth daily.    BUPROPION (WELLBUTRIN XL) 150 MG 24 HR TABLET    Take 1 tablet (150 mg total) by mouth daily. Swallow whole. Do not crush, break, or chew.    CYCLOBENZAPRINE (FLEXERIL) 5 MG TABLET    Take 1 tablet (5 mg total) by mouth 3 times daily as needed for Muscle spasms.    ESOMEPRAZOLE MAGNESIUM (NEXIUM) 40 MG CAPSULE    Take 1 capsule (40 mg total) by mouth daily (before breakfast).    FENOFIBRATE (TRICOR) 145 MG TABLET    Take 1 tablet (145 mg total) by mouth daily.    FLUOXETINE (PROZAC) 40 MG CAPSULE    Take 1 capsule (40 mg total) by mouth daily.    FLUTICASONE (FLONASE) 50 MCG/ACT NASAL SPRAY    Spray 1  spray into nostril daily    GABAPENTIN 600 MG TABLET    Take 1 tablet (600 mg total) by mouth 3 times daily.    HYDROCODONE-ACETAMINOPHEN (NORCO) 5-325 MG PER TABLET    Take 1 tablet by mouth every 6 hours as needed for Pain. Max daily dose: 4 tablets    LEVOTHYROXINE (SYNTHROID, LEVOTHROID) 25 MCG TABLET    TAKE 1 TABLET BY MOUTH EVERY DAY BEFORE BREAKFAST    MUPIROCIN (BACTROBAN) 2 % OINTMENT    Apply topically 3 times daily for nasal vestibulitis. to the following a2reas: nasal vestibule    NIFEDIPINE (PROCARDIA) 10 MG CAPSULE    Take 1 capsule (10 mg total) by mouth 2 times daily.   Modified Medications    No medications on file   Discontinued Medications    No medications on file          Physical Exam:  Vitals:    10/12/23 1033   BP: (!) 140/100   Pulse: (!) 118   Temp: 36.3 C (97.4 F)   Weight: 86.7 kg (191 lb 3.2 oz)   Height: 1.778 m (5\' 10" )     SpO2 Readings from Last 3 Encounters:   10/12/23 96%   10/09/23 95%   10/05/23 97%      Estimated body mass index is 27.43 kg/m as calculated from the following:     Height as of this encounter: 1.778 m (5\' 10" ).    Weight as of this encounter: 86.7 kg (191 lb 3.2 oz).  BP Readings from Last 3 Encounters:   10/12/23 (!) 140/100   10/09/23 136/84   10/09/23 131/88     Wt Readings from Last 3 Encounters:   10/12/23 86.7 kg (191 lb 3.2 oz)   10/09/23 85.3 kg (188 lb)   10/08/23 85.3 kg (188 lb)     Looks well.  Not in any acute distress  Neck-supple no adenopathy or thyromegaly  Cardiovascular-heart rate regular.  Heart sounds 1 and 2 with no added sounds no carotid bruits no edema  Respiratory-normal respiratory effort.  Good bilateral air entry.  Lungs are clear to palpation percussion and auscultation    Recent Lab Results:none      Assessment and Plan:     1.  Syncope secondary to orthostatic hypotension.  Recommend compression stockings to help prevent this.  Also discussed ankle flexion and extension before rising from lying or sitting.  Will also work rest of neurological evaluation  2.  Labile hypertension.  Continue with nifedipine  3.  Depression.  Continue fluoxetine and bupropion  4.  Stage IV chronic kidney disease    Continue current medications follow-up in 2 weeks.  At this time I do not think he is fit for gainful employment  Orders this visit  No orders of the defined types were placed in this encounter.

## 2023-10-12 NOTE — Telephone Encounter (Signed)
Lvm to call office back

## 2023-10-12 NOTE — Addendum Note (Signed)
Addended by: Eustace Quail on: 10/12/2023 11:22 AM     Modules accepted: Orders

## 2023-10-15 ENCOUNTER — Encounter: Payer: Self-pay | Admitting: Primary Care

## 2023-10-15 ENCOUNTER — Encounter: Payer: Self-pay | Admitting: Cardiology

## 2023-10-16 ENCOUNTER — Telehealth: Payer: Self-pay | Admitting: Cardiology

## 2023-10-16 ENCOUNTER — Telehealth: Payer: Self-pay | Admitting: Primary Care

## 2023-10-16 NOTE — Telephone Encounter (Signed)
Patient notified and verbalized understanding. 

## 2023-10-16 NOTE — Telephone Encounter (Signed)
NAME OF CALLER: Orvid Baratta  PHONE NUMBER OF CALLER: [phone]    [facility] I AM CALLING WITH APPOINTMENT?:   NAME: Kashmir Sonderegger   Phone Number: [phone]    HOSPITAL NAME: [facility]  DISCHARGE DATE: 12/24  REASON FOR ADMISSION: Syncopal episode    PROVIDER TO MAKE APPT WITH: Dr. Noah Charon   TIME [facility] FOR FOLLOW UP: Before February    Also, he sent patient message on 12/30 requesting appointment.

## 2023-10-16 NOTE — Telephone Encounter (Signed)
Patient seen in office 12/27

## 2023-10-16 NOTE — Telephone Encounter (Signed)
Patient and mother report there is confusion with the stage of kidney disease - Dr Office visit says stage IV and referral says stage I    Please discuss with Dr Dwaine Gale and advise patient    Patient is upset that his nephrology appointment is 6 months away if in fact it is stage IV

## 2023-10-18 ENCOUNTER — Other Ambulatory Visit: Payer: Self-pay

## 2023-10-18 ENCOUNTER — Encounter: Payer: Self-pay | Admitting: Otolaryngology

## 2023-10-18 ENCOUNTER — Telehealth: Payer: Self-pay | Admitting: Primary Care

## 2023-10-18 DIAGNOSIS — C109 Malignant neoplasm of oropharynx, unspecified: Secondary | ICD-10-CM

## 2023-10-18 DIAGNOSIS — R1312 Dysphagia, oropharyngeal phase: Secondary | ICD-10-CM

## 2023-10-18 NOTE — Progress Notes (Signed)
Pharyngogram ordered

## 2023-10-18 NOTE — Telephone Encounter (Addendum)
 Spoke to [place], admitted d/t orthostatic.      Scheduled Feb 28th with Dr Damita Dull in [place],  [facility] not till the end of Jan for an appointment.    Readings from yesterday  Lying 162/98  Sitting 158/98  Standing 128/88    Does report terrible headaches.  Encouraged to go to ED, refused. Nurse very concerned as he is very symptomatic.    Readings today  Sitting 160/107   Standing 140/100    He has not passed out since the 22nd.  He does have to grab things to ambulate and stand up etc.     Can we get him scheduled with you?  Will call with James Oneill with appt.

## 2023-10-18 NOTE — Telephone Encounter (Signed)
 Cheryl from [facility] is calling stating patients blood pressure is very orthostatic and out of parameter     She would like a call back

## 2023-10-18 NOTE — Telephone Encounter (Signed)
 Hello, pt needs a hospital follow up with you as he was discharged on 12/24  but you are booking until 1/28 is he ok to see APP for a sooner appointment? Please advise

## 2023-10-18 NOTE — Telephone Encounter (Signed)
 Miranda from PT calling to check on status for pt to be seen . Please advise

## 2023-10-19 NOTE — Telephone Encounter (Signed)
 Spoke to patient he is declining an appointment this am d/t transportation, and the [facility] coming.  Patient is fearful that he will spend the weekend in the ED.  Reports headaches are really only when he repositions. Rates pain 7-8 head pain when he repositions. The pain does not last long.   [facility] is due to come within the hour.      Readings this am:  Lying 147/95 P 83  Sitting 111/75 P 85  Standing 80/60 P 93    Patient is still concerned with not getting in with a cardiologist quickly.   Any other recommendations?

## 2023-10-19 NOTE — Telephone Encounter (Signed)
 Looks as though there is an encounter sent directly to provider awaiting confirmation to book a sooner appointment.

## 2023-10-19 NOTE — Telephone Encounter (Signed)
 It doesn't look like he has one scheduled.  He did send them a message asking for an appointment.

## 2023-10-20 ENCOUNTER — Other Ambulatory Visit: Payer: Self-pay | Admitting: Primary Care

## 2023-10-22 ENCOUNTER — Emergency Department
Admission: EM | Admit: 2023-10-22 | Discharge: 2023-10-22 | Disposition: A | Discharge status: Home / Self Care | Payer: Medicare Other | Source: Ambulatory Visit | Attending: Emergency Medicine | Admitting: Emergency Medicine

## 2023-10-22 ENCOUNTER — Emergency Department: Payer: Medicare Other

## 2023-10-22 ENCOUNTER — Other Ambulatory Visit: Payer: Self-pay

## 2023-10-22 DIAGNOSIS — Z5329 Procedure and treatment not carried out because of patient's decision for other reasons: Secondary | ICD-10-CM | POA: Insufficient documentation

## 2023-10-22 DIAGNOSIS — M549 Dorsalgia, unspecified: Secondary | ICD-10-CM | POA: Insufficient documentation

## 2023-10-22 DIAGNOSIS — R454 Irritability and anger: Secondary | ICD-10-CM | POA: Insufficient documentation

## 2023-10-22 DIAGNOSIS — R0989 Other specified symptoms and signs involving the circulatory and respiratory systems: Secondary | ICD-10-CM

## 2023-10-22 DIAGNOSIS — K5900 Constipation, unspecified: Secondary | ICD-10-CM | POA: Insufficient documentation

## 2023-10-22 DIAGNOSIS — I10 Essential (primary) hypertension: Secondary | ICD-10-CM | POA: Insufficient documentation

## 2023-10-22 DIAGNOSIS — R0981 Nasal congestion: Secondary | ICD-10-CM | POA: Insufficient documentation

## 2023-10-22 DIAGNOSIS — R1031 Right lower quadrant pain: Secondary | ICD-10-CM

## 2023-10-22 DIAGNOSIS — H538 Other visual disturbances: Secondary | ICD-10-CM | POA: Insufficient documentation

## 2023-10-22 DIAGNOSIS — R42 Dizziness and giddiness: Secondary | ICD-10-CM | POA: Insufficient documentation

## 2023-10-22 DIAGNOSIS — R079 Chest pain, unspecified: Secondary | ICD-10-CM

## 2023-10-22 HISTORY — DX: Orthostatic hypotension: I95.1

## 2023-10-22 LAB — CBC AND DIFFERENTIAL
Baso # K/uL: 0 10*3/uL (ref 0.0–0.2)
Eos # K/uL: 0.1 10*3/uL (ref 0.0–0.5)
Hematocrit: 40 % (ref 37–52)
Hemoglobin: 13.9 g/dL (ref 12.0–17.0)
IMM Granulocytes #: 0 10*3/uL (ref 0.0–0.0)
IMM Granulocytes: 0.4 %
Lymph # K/uL: 1.3 10*3/uL (ref 1.0–5.0)
MCV: 89 fL (ref 75–100)
Mono # K/uL: 0.6 10*3/uL (ref 0.1–1.0)
Neut # K/uL: 2.8 10*3/uL (ref 1.5–6.5)
Nucl RBC # K/uL: 0 10*3/uL (ref 0.0–0.0)
Nucl RBC %: 0 /100{WBCs} (ref 0.0–0.2)
Platelets: 284 10*3/uL (ref 150–450)
RBC: 4.5 MIL/uL (ref 4.0–6.0)
RDW: 12 % (ref 0.0–15.0)
Seg Neut %: 57.4 %
WBC: 4.8 10*3/uL (ref 3.5–11.0)

## 2023-10-22 LAB — URINALYSIS WITH REFLEX TO MICROSCOPIC
Blood,UA: NEGATIVE
Glucose,UA: NEGATIVE
Ketones, UA: NEGATIVE
Leuk Esterase,UA: NEGATIVE
Nitrite,UA: NEGATIVE
Protein,UA: NEGATIVE
Specific Gravity,UA: 1.01 (ref 1.002–1.030)
pH,UA: 7 (ref 5.0–8.0)

## 2023-10-22 LAB — COMPREHENSIVE METABOLIC PANEL, PL
ALT, PL: 40 U/L (ref 0–50)
AST, PL: 31 U/L (ref 0–50)
Albumin, PL: 4.8 g/dL (ref 3.5–5.2)
Alk Phos, PL: 46 U/L (ref 40–130)
Anion Gap,PL: 7 (ref 7–16)
Bilirubin Total, PL: 0.6 mg/dL (ref 0.0–1.2)
CO2,Plasma: 31 mmol/L — ABNORMAL HIGH (ref 20–28)
Calcium, PL: 10.4 mg/dL — ABNORMAL HIGH (ref 8.6–10.2)
Chloride,Plasma: 102 mmol/L (ref 96–108)
Creatinine: 1.38 mg/dL — ABNORMAL HIGH (ref 0.67–1.17)
Glucose,Plasma: 93 mg/dL (ref 60–99)
Potassium,Plasma: 4.2 mmol/L (ref 3.3–4.6)
Sodium,Plasma: 140 mmol/L (ref 133–145)
Total Protein, PL: 7.2 g/dL (ref 6.3–7.7)
UN,Plasma: 12 mg/dL (ref 6–20)
eGFR BY CREAT: 62 *

## 2023-10-22 LAB — TROPONIN T 1 HR W/ DELTA HIGH SENSITIVITY: TROP T 1 HR High Sensitivity: <6 ng/L (ref 0–11)

## 2023-10-22 LAB — TROPONIN T 0 HR HIGH SENSITIVITY (IP/ED ONLY): TROP T 0 HR High Sensitivity: <6 ng/L (ref 0–11)

## 2023-10-22 LAB — HOLD SST

## 2023-10-22 LAB — HOLD BLUE

## 2023-10-22 MED ORDER — CYCLOBENZAPRINE HCL 10 MG PO TABS *I*
5.0000 mg | ORAL_TABLET | Freq: Once | ORAL | Status: DC
Start: 2023-10-22 — End: 2023-10-23

## 2023-10-22 MED ORDER — GABAPENTIN 300 MG PO CAPSULE *I*
600.0000 mg | ORAL_CAPSULE | Freq: Once | ORAL | Status: DC
Start: 2023-10-22 — End: 2023-10-23

## 2023-10-22 MED ORDER — NIFEDIPINE 10 MG PO CAPS *I*
10.0000 mg | ORAL_CAPSULE | Freq: Once | ORAL | Status: DC
Start: 2023-10-22 — End: 2023-10-23

## 2023-10-22 MED ORDER — CYCLOBENZAPRINE HCL 5 MG PO TABS *I*
5.0000 mg | ORAL_TABLET | Freq: Three times a day (TID) | ORAL | 2 refills | Status: DC | PRN
Start: 2023-10-22 — End: 2023-11-30

## 2023-10-22 MED ORDER — IOHEXOL 350 MG/ML (OMNIPAQUE) IV SOLN 500ML BOTTLE *I*
1.0000 mL | Freq: Once | INTRAVENOUS | Status: AC
Start: 2023-10-22 — End: 2023-10-22
  Administered 2023-10-22: 110 mL via INTRAVENOUS

## 2023-10-22 MED ORDER — HYDRALAZINE HCL 20 MG/ML IJ SOLN *I*
10.0000 mg | Freq: Once | INTRAMUSCULAR | Status: DC
Start: 2023-10-22 — End: 2023-10-23

## 2023-10-22 MED ORDER — POLYETHYLENE GLYCOL 3350 PO POWD *I*
17.0000 g | Freq: Every day | ORAL | 0 refills | Status: AC
Start: 2023-10-22 — End: 2023-10-27

## 2023-10-22 NOTE — Telephone Encounter (Signed)
Currently in ED

## 2023-10-22 NOTE — ED Provider Progress Notes (Signed)
The patient and his sister are pressing for an admission.  I explained multiple times that there was no reason to admit him at this point.  I explained he needs to follow-up with his PCP as well as either the cardiologist he has an appointment with or he can try Dr. Robley Fries to see if he can be seen sooner.  Patient became angry pulled his IV out left without his medications.   ED Provider Progress Note            Loman Brooklyn, PA, 10/22/2023, 7:00 PM     Loman Brooklyn, [place]  10/22/23 1901

## 2023-10-22 NOTE — ED Provider Notes (Addendum)
History     Chief Complaint   Patient presents with    Hypertension    Syncope     48 year old male with a history of labile hypertension, orthostatic hypotension, tonsillar cancer with neck dissection and radiation treatment chronic fatigue and mitral valve prolapse.  He presents to the emergency department complaining of blurred vision headaches back pain and feeling like he might pass out.  He has left anterior chest pain that is nonradiating and reports that this is similar pain that he is having with his mitral valve prolapse in the past.  Patient had some issues with syncope at the end of last month and in fact at 1 point required suturing of his nose and admission to the hospital for workup.  On 10/09/2023 the patient had a dobutamine stress test that did not show any evidence of ischemia.  It did show some mitral valve prolapse and trace aortic regurgitation.  On 11/08/2022 the patient had an essentially negative MRI of his head.      History provided by:  Patient and relative        Medical/Surgical/Family History     Past Medical History:   Diagnosis Date    Cancer     Throat    Depression     Hypertension     MVP (mitral valve prolapse)     Orthostatic hypotension         Patient Active Problem List   Diagnosis Code    Tonsillar cancer C09.9    History of cholecystectomy Z90.49    MVP (mitral valve prolapse) I34.1    Depression F32.A    Neuropathy G62.9    Chronic fatigue R53.82    Hypertension I10    ED (erectile dysfunction) N52.9    monitoring for cancer therapy related  cardiac dysfunction Z91.89    Syncope and collapse R55    Dyslipidemia E78.5    Hypothyroid E03.9            Past Surgical History:   Procedure Laterality Date    ELBOW FRACTURE SURGERY      GALLBLADDER SURGERY  2019    HERNIA REPAIR  2019    umbilical    NECK SURGERY Left 2018    radical dissection    TONSILLECTOMY  2017    SCC Left tonsil          Social History     Tobacco Use    Smoking status: Never    Smokeless tobacco: Never    Substance Use Topics    Alcohol use: Yes     Alcohol/week: 1.0 standard drink of alcohol     Types: 1 Glasses of wine per week    Drug use: Not Currently             Review of Systems   Constitutional:  Positive for fatigue. Negative for chills, diaphoresis and fever.   HENT: Negative.     Eyes: Negative.    Respiratory:  Positive for shortness of breath. Negative for cough.    Cardiovascular:  Positive for chest pain and palpitations. Negative for leg swelling.   Gastrointestinal:  Positive for abdominal pain and constipation.   Endocrine: Negative.    Genitourinary: Negative.    Neurological:  Positive for dizziness, syncope and headaches. Negative for seizures, facial asymmetry, speech difficulty and light-headedness.   Hematological: Negative.    Psychiatric/Behavioral: Negative.     All other systems reviewed and are negative.      Physical Exam  Triage Vitals  Triage Start: Start, (10/22/23 1007)  First Recorded BP: (!) 165/115, Resp: 16, Temp: 36.7 C (98 F), Temp src: TEMPORAL Oxygen Therapy SpO2: 98 %, Oximetry Source: Rt Hand, O2 Device: None (Room air), Heart Rate: 109, (10/22/23 1010) Heart Rate (via Pulse Ox): 109, (10/22/23 1010).  First Pain Reported  0-10 Scale: 5, Pain Location/Orientation: Head;Back;Chest, (10/22/23 1010)       Physical Exam  Vitals and nursing note reviewed.   Constitutional:       Comments: 48 year old male conscious and alert vital signs are reviewed he is in no distress   HENT:      Head: Normocephalic and atraumatic.      Right Ear: Tympanic membrane normal.      Left Ear: Tympanic membrane normal.      Nose: Congestion present.      Mouth/Throat:      Mouth: Mucous membranes are moist.      Pharynx: Oropharynx is clear.   Eyes:      Pupils: Pupils are equal, round, and reactive to light.   Cardiovascular:      Rate and Rhythm: Normal rate and regular rhythm.   Pulmonary:      Effort: Pulmonary effort is normal. No respiratory distress.      Breath sounds: Normal breath  sounds.   Abdominal:      Palpations: Abdomen is soft.      Tenderness: There is no abdominal tenderness. There is no right CVA tenderness or left CVA tenderness.   Musculoskeletal:         General: Normal range of motion.   Skin:     General: Skin is warm and dry.   Neurological:      General: No focal deficit present.      Mental Status: He is oriented to person, place, and time.         Medical Decision Making   Patient seen by me on:  10/22/2023    Assessment:  48 year old male who presents with multiple complaints.  Primarily elevated blood pressure, dizziness, blurry vision, back pain and constipation.  Constipation started after beginning nifedipine this may be the cause of that.    ED Course and Disposition:  Patient rested comfortably throughout his stay in the emergency department.  Chemistry showed a creatinine of 1.38 a BUN of 12 calcium is 10.4 CO2 is 31 hemogram is normal urine is normal 0-hour troponins less than six 1 hour troponin less than 6.  The patient has been able to walk to the bathroom without any difficulty.  At this point there is no indication for admission.  I did speak with Dr. Robley Fries regarding the patient's blood pressure and we discussed maybe starting him on 10 mg of a Apresoline but since his blood pressure has normalized here in the ER I am going to hold off as I do not want him to have any further syncope.  Regarding his complaint of constipation.  This is most likely secondary to the nifedipine and have asked him to start taking MiraLAX.  There is no medical emergency identified at this time he is stable for discharge           Loman Brooklyn, PA            Loman Brooklyn, [place]  10/22/23 1844       Loman Brooklyn, PA  10/22/23 1850       Loman Brooklyn, [place]  10/22/23 1902

## 2023-10-22 NOTE — ED Notes (Signed)
Obtain orthostatics on pt  Laying HR 87   BP 144/99  Sitting HR 94  BP 161/112  Standing HR 101  BP 110/83

## 2023-10-22 NOTE — Telephone Encounter (Signed)
Message is still waiting for MD approval.   Will continue to monitor

## 2023-10-22 NOTE — ED Notes (Signed)
Pt states his last bowel movement was about 10 days ago. Pt taking stool softeners daily.

## 2023-10-22 NOTE — ED Notes (Addendum)
Pt walked up to nurses station, threw his IV line on the counter and waked out with willingness to discuss discharge or medications due. Pt ignored staff and continued walking out the building. Provider present and aware of pts choice and actions.

## 2023-10-22 NOTE — Discharge Instructions (Addendum)
Go home and rest  Continue your usual medications  MiraLAX for your constipation  Please return to the emergency department if symptoms change or worsen  Your workup in the emergency department today is unremarkable.  CAT scan does show a low-density lesion in your kidneys and the CAT scan of your head shows a frontal lobe lesion that was there on your MRIs.  It is important that you talk to your primary care provider regarding these.

## 2023-10-22 NOTE — ED Triage Notes (Addendum)
Pt c/o blurry vision, headaches, back pain, increased heart rate and "feeling like he is going to pass out." Visiting nurse and PCP directed pt to the ED. Recent syncopal episodes. Headache becomes much worse with changing positions. Recent admission to [facility] where pt had a stress test completed. Pt states he was told he has a mitral valve prolapse and atrial valve regurgitation. Pt keep track of orthostatic Bps. Not yet set up with a cardiologist.     Prehospital medications given: No

## 2023-10-22 NOTE — ED Notes (Signed)
Resting on stretcher, denies any needs at this time.

## 2023-10-23 ENCOUNTER — Encounter: Payer: Self-pay | Admitting: Cardiology

## 2023-10-23 ENCOUNTER — Ambulatory Visit: Payer: Medicare Other | Attending: Cardiology | Admitting: Cardiology

## 2023-10-23 ENCOUNTER — Telehealth: Payer: Self-pay

## 2023-10-23 VITALS — BP 180/98 | HR 123 | Resp 22 | Ht 70.0 in | Wt 190.0 lb

## 2023-10-23 DIAGNOSIS — I10 Essential (primary) hypertension: Secondary | ICD-10-CM | POA: Insufficient documentation

## 2023-10-23 LAB — EKG 12-LEAD
P: 30 deg
PR: 178 ms
QRS: 102 deg
QRSD: 106 ms
QT: 370 ms
QTc: 445 ms
Rate: 87 {beats}/min
T: 126 deg

## 2023-10-23 MED ORDER — CARVEDILOL 6.25 MG PO TABS *I*
6.2500 mg | ORAL_TABLET | Freq: Two times a day (BID) | ORAL | 3 refills | Status: DC
Start: 2023-10-23 — End: 2024-01-02

## 2023-10-23 NOTE — Telephone Encounter (Signed)
Called and left voicemail and mychart message letting patient know that his PHG is scheduled on Tuesday January 21 at 3:00.

## 2023-10-23 NOTE — Progress Notes (Signed)
Patient Name: James Oneill       Patient problem list, allergies, medications were reviewed and updated as appropriate.  The patient was accompanied today by his mom Carney Bern.  He lives with her.      Chief Complaint/Reason for Visit: My blood pressure has been quite variable.  Have not been feeling well.      HPI: 48 year old gentleman referred for office consultation.  Capers has a history of chronic essential hypertension.  He has been on numerous different classes of medications over the last couple years.  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  History of mild mitral valve prolapse without significant regurgitation, hypertriglyceridemia, hypothyroidism, depression.  He had presented back on 10/06/2023 to [facility] ER after syncope of unclear origin.  He required suturing for his facial/nose lacerations.  He was admitted there and was found to have orthostatic hypotension.  He was at that time being treated with metoprolol and losartan.  The meds were discontinued and he was placed on Procardia XL 30 mg daily but then had hypotension.  He was then instead placed on nifedipine 10 mg twice daily.    While hospitalized he underwent dobutamine stress echo-did not reach target heart rate before he had hypotension for which the dobutamine drip was discontinued and his blood pressure was normalized with saline intravenously.  For the degree of work he performed, he had no evidence for cardiomyopathy or significant valvular disease.  No obvious evidence for ischemic wall motion abnormalities at submaximal target heart rate.  Kaloyan is known to have baseline CKD for at least the past year.  His hospital-based bilateral renal ultrasound was negative for renal artery stenosis.  He has a future appointment to meet with nephrology.    Yesterday, the patient presented to [facility] emergency room.  The emergency room PA reached out to me-he was complaining of  blurry vision and headaches and back pain and feeling like he might pass out he had some nonexertional chest discomfort.  He ruled out for MI.  We had recommended the ER give him some IV hydralazine for his blood pressure of 170s over 110s however his blood pressures seem to come down to within acceptable range and he was not given any emergency room BP meds.  He was released to home and now presents today.  No claudication type symptoms.  No TIA or CVA-like symptoms.    Family History: Noncontributory      Nonsmoker, denies EtOH or illicit/toxic habits.  The patient reports compliance with his/her medications.    Other past medical history as detailed below.    ROS: 10 point review of systems performed.  Recent symptoms as defined above.  Otherwise, no findings pertinent to this visit.      Past Medical History:   Diagnosis Date    Cancer     Throat    Depression     Hypertension     MVP (mitral valve prolapse)     Orthostatic hypotension         Past Surgical History:   Procedure Laterality Date    ELBOW FRACTURE SURGERY      GALLBLADDER SURGERY  2019    HERNIA REPAIR  2019    umbilical    NECK SURGERY Left 2018    radical dissection    TONSILLECTOMY  2017    SCC Left tonsil        Social History  Socioeconomic History    Marital status: Divorced   Tobacco Use    Smoking status: Never    Smokeless tobacco: Never   Substance and Sexual Activity    Alcohol use: Yes     Alcohol/week: 1.0 standard drink of alcohol     Types: 1 Glasses of wine per week    Drug use: Not Currently    Sexual activity: Not Currently          Allergies   Allergen Reactions    Shellfish-Derived Products Hives          Current Outpatient Medications:     Non-System Medication, Medication/Supply: below knee compression stockings Directions for Use: remove at night, Disp: 1 each, Rfl: 0    buPROPion (WELLBUTRIN XL) 150 mg 24 hr tablet, Take 1 tablet (150 mg total) by mouth daily. Swallow whole. Do not crush, break, or chew., Disp: 30 tablet,  Rfl: 0    fenofibrate (TRICOR) 145 mg tablet, Take 1 tablet (145 mg total) by mouth daily., Disp: 30 tablet, Rfl: 0    FLUoxetine (PROZAC) 40 mg capsule, Take 1 capsule (40 mg total) by mouth daily., Disp: 30 capsule, Rfl: 2    levothyroxine (SYNTHROID, LEVOTHROID) 25 mcg tablet, TAKE 1 TABLET BY MOUTH EVERY DAY BEFORE BREAKFAST, Disp: 30 tablet, Rfl: 2    mupirocin (BACTROBAN) 2 % ointment, Apply topically 3 times daily for nasal vestibulitis. to the following a2reas: nasal vestibule, Disp: 22 g, Rfl: 3    esomeprazole magnesium (NEXIUM) 40 mg capsule, Take 1 capsule (40 mg total) by mouth daily (before breakfast)., Disp: 90 capsule, Rfl: 2    gabapentin 600 mg tablet, Take 1 tablet (600 mg total) by mouth 3 times daily., Disp: 270 tablet, Rfl: 2    fluticasone (FLONASE) 50 MCG/ACT nasal spray, Spray 1 spray into nostril daily, Disp: 16 g, Rfl: 5    carvedilol (COREG) 6.25 mg tablet, Take 1 tablet (6.25 mg total) by mouth 2 times daily (with meals) for High Blood Pressure., Disp: 60 tablet, Rfl: 3    cyclobenzaprine (FLEXERIL) 5 mg tablet, Take 1 tablet (5 mg total) by mouth 3 times daily as needed for Muscle spasms., Disp: 30 tablet, Rfl: 2    polyethylene glycol (GLYCOLAX) powder, Take 17 g by mouth daily for 5 days. Mix in 8 oz water or juice and drink., Disp: 85 g, Rfl: 0    atorvastatin (LIPITOR) 40 mg tablet, Take 1 tablet (40 mg total) by mouth daily. (Patient not taking: Reported on 10/23/2023), Disp: 90 tablet, Rfl: 3    HYDROcodone-acetaminophen (NORCO) 5-325 mg per tablet, Take 1 tablet by mouth every 6 hours as needed for Pain. Max daily dose: 4 tablets, Disp: 28 tablet, Rfl: 0     Vitals:    10/23/23 1339   BP: (!) 180/98   Pulse: (!) 123   Resp: 22   Weight: 86.2 kg (190 lb)   Height: 1.778 m (5\' 10" )        PHYSICAL EXAM:     General: Patient is awake, alert and oriented to person, place and time.  No acute distress.    HEENT: Head atraumatic, no conjunctival pallor, no xanthelasma, no jugular venous  distention, normal carotid upstroke and volume, no carotid bruits, no thyromegaly.    Cardiovascular: Tachycardic, normal S1 and S2, soft, short systolic murmur over the precordium, no rub or gallop.  Normal PMI.    Pulmonary: Lungs clear to auscultation anteriorly and posteriorly without rales or rhonchi  or wheezing.  No dullness to percussion.  No egophony.  No accessory muscle usage.      Abdominal: Not assessed today.    Extremities: No clubbing, cyanosis, or pitting edema.  Distal extremities symmetrically warm bilaterally.  2+ brachial, radial, ulnar pulses bilaterally, 2+ dorsalis pedis and posterior tibial pulses bilaterally.  No rashes or lesions.  Normal capillary refill.  No pallor.    Neurologic and Psychiatric: Not assessed today.           12 Lead ECG: Not repeated.  Tracing from ER yesterday sinus rhythm, no concerning ST segment abnormality.      ASSESSMENT: Essential stage II hypertension.  Atypical chest pain pattern.  Postural syncope        The patient has a normal cardiovascular and peripheral vascular exam today.  No evidence for left or right-sided CHF.  No classic anginal type symptoms.        PLAN:    1.  Essential stage II hypertension- the patient's blood pressure is not optimized on his low-dose Procardia.  As discussed with Jonny Ruiz today, favor twice a day dosing agent that avoids renal interaction in the setting of his CKD.  He is also tachycardic today.  Not known to have underlying secondary cause by chart review.  Previously seen by cardiology at [facility].      Patient is agreeable to starting carvedilol.     Carvedilol 6.25 mg twice daily.  The patient will carefully monitor his afternoon blood pressures at home and we will uptitrate his regimen as needed.  Future consideration would be to add hydralazine.  He will start the carvedilol this evening and he will forego his evening dose of his calcium channel blocker.  He will wean off of his nifedipine over the next 24 hours.  Does  not add salt to his food.  We discussed the importance of no added salt 1500-2000 mg sodium diet per day.  Current guidelines recommend goal BP less than 130/80 mmHg.    Behavioral health-I do sense that [place] is anxious.  I do feel that this is contributing to his sinus tachycardia and at least in part driving his hypertension.  We will leave it to your discretion as far as further optimization of his behavioral health therapy.    2.  Risk factor/Lifestyle modification/Dyslipidemia-definitely seems to benefit from statin therapy with a dramatic drop in his total cholesterol from 316 down to 182 on his blood work 2 months ago.  Favorable improvement in his LDL as well.  He is also been modifying his diet and his triglycerides also dramatically improved from over thousand down to 420s.  We emphasize the importance of dietary portion control, eliminating refined sugar/white flour based products and adopting low saturated fat, Mediterranean style, no added salt diet.  We recommend online resources such as the American Heart Association dietary website.  I have asked the patient to push p.o. fluids and to avoid quick changes in body position.  He asked about exercising, I explained to him that treadmill walking is fine but no heavy strenuous exercising/workouts while we are getting his blood pressure under control.    We discussed the importance of staying active with regular aerobic exercise regimen or equivalent as tolerates.       We will otherwise plan to see the patient back 6 to 8 weeks, sooner as necessary.  All questions answered.       Thank you for allowing me to participate in the care  of this pleasant patient.  Please do not hesitate to contact me if you have questions.    Sincerely,      Felix Ahmadi, D.O. [facility]      [facility] Division of Cardiovascular Diseases  [facility]  [place] office [phone], [place] office [phone]     This  dictation was facilitated using microphone based speech recognition technology [employer] engineer [employer]- Epic version).  It was proofread by this Chartered loss adjuster.

## 2023-10-25 ENCOUNTER — Ambulatory Visit: Payer: Medicare Other | Admitting: Nurse Practitioner

## 2023-10-25 ENCOUNTER — Telehealth: Payer: Self-pay | Admitting: Primary Care

## 2023-10-25 NOTE — Telephone Encounter (Signed)
Spoke with Miranda. She stated that patient saw Cardiologist on Tuesday and changed one of his BP medications and put him on carvedilol.  Patient is very symptomatic when this happens. Patient was also seen in the ED on Monday

## 2023-10-25 NOTE — Telephone Encounter (Signed)
[facility] Tamera Punt [phone]  Is calling stating she saw patient 1.8.25   He is on a new blood pressure medication.   His BP sitting is 130/92 and it drops when he is standing, when he stood yesterday it went to 74/72  Please advise

## 2023-10-25 NOTE — Telephone Encounter (Signed)
Left message for Miranda to call me back at my extension

## 2023-10-26 ENCOUNTER — Ambulatory Visit: Payer: Medicare Other | Attending: Primary Care | Admitting: Primary Care

## 2023-10-26 ENCOUNTER — Other Ambulatory Visit: Payer: Self-pay

## 2023-10-26 ENCOUNTER — Encounter: Payer: Self-pay | Admitting: Primary Care

## 2023-10-26 VITALS — BP 150/105 | HR 97 | Temp 97.1°F | Ht 70.0 in | Wt 192.4 lb

## 2023-10-26 DIAGNOSIS — J029 Acute pharyngitis, unspecified: Secondary | ICD-10-CM | POA: Insufficient documentation

## 2023-10-26 DIAGNOSIS — F419 Anxiety disorder, unspecified: Secondary | ICD-10-CM | POA: Insufficient documentation

## 2023-10-26 DIAGNOSIS — I169 Hypertensive crisis, unspecified: Secondary | ICD-10-CM | POA: Insufficient documentation

## 2023-10-26 DIAGNOSIS — R55 Syncope and collapse: Secondary | ICD-10-CM | POA: Insufficient documentation

## 2023-10-26 DIAGNOSIS — R0989 Other specified symptoms and signs involving the circulatory and respiratory systems: Secondary | ICD-10-CM | POA: Insufficient documentation

## 2023-10-26 LAB — POCT AMBULATORY RAPID STREP
Lot #: 809008
Rapid Strep Group A Throat-POC: NEGATIVE

## 2023-10-26 MED ORDER — QUETIAPINE FUMARATE 25 MG PO TABS *I*
25.0000 mg | ORAL_TABLET | Freq: Every evening | ORAL | 0 refills | Status: DC
Start: 2023-10-26 — End: 2023-11-21

## 2023-10-26 NOTE — Progress Notes (Signed)
Patient ID: James Oneill is a 48 y.o. year old male.    Chief Complaint   Patient presents with    Follow-up     2 week     YNW:GNFA is here today for reevaluation of his labile hypertension, orthostatic hypotension anxiety and depression.  Recent emergency room and cardiology visits were reviewed with him.  His nifedipine has been discontinued.  He has been started on carvedilol.  He is tolerating this well thus far.  He still has episodes of lightheadedness.  No further syncopal episodes.  He reports episodes of tachycardia.  These occur every couple days and last for about 2 minutes at a time.  No facial flushing coughing or wheezing.  As previously reported diarrhea has not been an issue of late.  In fact the nifedipine made him constipated  Currently taking fluoxetine for depression.  Wellbutrin was recently added to this whilst he was in the hospital for anxiety.  He does not see where this has helped.    Today he reports a right sided sore throat for the past couple days.  No fever or chills    Recent Results (from the past 24 hours)   POCT ambulatory rapid strep    Collection Time: 10/26/23 11:45 AM   Result Value Ref Range    Rapid Strep Group A Throat-POC Negative for Streptococcus Group A Antigen Negative    INTERNAL CONTROL RAPID STREP POCT *Yes-internal procedural control(s) acceptable     Exp date 08/28/2024     Lot # [id number]              The medication and allergy list was reviewed and is accurate  Allergy / Social History / Medications:  Allergies   Allergen Reactions    Shellfish-Derived Products Hives     Social History     Tobacco Use    Smoking status: Never    Smokeless tobacco: Never   Substance Use Topics    Alcohol use: Yes     Alcohol/week: 1.0 standard drink of alcohol     Types: 1 Glasses of wine per week     Patient's Medications   New Prescriptions    QUETIAPINE (SEROQUEL) 25 MG TABLET    Take 1 tablet (25 mg total) by mouth nightly.   Previous Medications    ATORVASTATIN  (LIPITOR) 40 MG TABLET    Take 1 tablet (40 mg total) by mouth daily.    CARVEDILOL (COREG) 6.25 MG TABLET    Take 1 tablet (6.25 mg total) by mouth 2 times daily (with meals) for High Blood Pressure.    CYCLOBENZAPRINE (FLEXERIL) 5 MG TABLET    Take 1 tablet (5 mg total) by mouth 3 times daily as needed for Muscle spasms.    ESOMEPRAZOLE MAGNESIUM (NEXIUM) 40 MG CAPSULE    Take 1 capsule (40 mg total) by mouth daily (before breakfast).    FENOFIBRATE (TRICOR) 145 MG TABLET    Take 1 tablet (145 mg total) by mouth daily.    FLUOXETINE (PROZAC) 40 MG CAPSULE    Take 1 capsule (40 mg total) by mouth daily.    FLUTICASONE (FLONASE) 50 MCG/ACT NASAL SPRAY    Spray 1 spray into nostril daily    GABAPENTIN 600 MG TABLET    Take 1 tablet (600 mg total) by mouth 3 times daily.    HYDROCODONE-ACETAMINOPHEN (NORCO) 5-325 MG PER TABLET    Take 1 tablet by mouth every 6 hours as needed for Pain. Max  daily dose: 4 tablets    LEVOTHYROXINE (SYNTHROID, LEVOTHROID) 25 MCG TABLET    TAKE 1 TABLET BY MOUTH EVERY DAY BEFORE BREAKFAST    MUPIROCIN (BACTROBAN) 2 % OINTMENT    Apply topically 3 times daily for nasal vestibulitis. to the following a2reas: nasal vestibule    NON-SYSTEM MEDICATION    Medication/Supply: below knee compression stockings  Directions for Use: remove at night    POLYETHYLENE GLYCOL (GLYCOLAX) POWDER    Take 17 g by mouth daily for 5 days. Mix in 8 oz water or juice and drink.   Modified Medications    No medications on file   Discontinued Medications    BUPROPION (WELLBUTRIN XL) 150 MG 24 HR TABLET    Take 1 tablet (150 mg total) by mouth daily. Swallow whole. Do not crush, break, or chew.          Physical Exam:  Vitals:    10/26/23 1116   BP: (!) 150/105   Pulse: 97   Temp: 36.2 C (97.1 F)   Weight: 87.3 kg (192 lb 6.4 oz)   Height: 1.778 m (5\' 10" )     SpO2 Readings from Last 3 Encounters:   10/26/23 97%   10/23/23 98%   10/22/23 97%      Estimated body mass index is 27.61 kg/m as calculated from the  following:    Height as of this encounter: 1.778 m (5\' 10" ).    Weight as of this encounter: 87.3 kg (192 lb 6.4 oz).  BP Readings from Last 3 Encounters:   10/26/23 (!) 150/105   10/23/23 (!) 180/98   10/22/23 (!) 155/103     Wt Readings from Last 3 Encounters:   10/26/23 87.3 kg (192 lb 6.4 oz)   10/23/23 86.2 kg (190 lb)   10/22/23 83.9 kg (185 lb)     Looks well.  Not in any acute distress  ENT-normal  Neck-supple.  S/p left-sided radical neck dissection no adenopathy or thyromegaly  Cardiovascular-heart rate regular.  Heart sounds 1 and 2 with no added sounds no carotid bruits no edema  Respiratory-normal respiratory effort.  Good bilateral air entry.  Lungs are clear to palpation percussion and auscultation    Recent Lab Results:none      Assessment and Plan:     1.  Labile hypertension.  Tolerating carvedilol well.  I would not change the dose at this time.  Recommend reevaluation 2 weeks.  Also recommend screening for carcinoid syndrome and pheochromocytoma  2.  Anxiety with depression.  Discontinue bupropion.  Trial of quetiapine 25 mg at bedtime.  Discontinue if this makes his dizziness worse  3.  Sore throat.  Recommend symptomatic treatment pending results of throat culture    Orders this visit  Orders Placed This Encounter   Procedures    Strep A culture, throat    CATECHOLAMINES, FRACTIONATED, URINE    5 HIAA, QUANTITATIVE, URINE    POCT ambulatory rapid strep

## 2023-10-28 ENCOUNTER — Other Ambulatory Visit
Admission: RE | Admit: 2023-10-28 | Discharge: 2023-10-28 | Disposition: A | Discharge status: Home / Self Care | Payer: Medicare Other | Source: Ambulatory Visit | Attending: Primary Care | Admitting: Primary Care

## 2023-10-28 DIAGNOSIS — R0989 Other specified symptoms and signs involving the circulatory and respiratory systems: Secondary | ICD-10-CM | POA: Insufficient documentation

## 2023-10-28 LAB — STREP A CULTURE, THROAT: Group A Strep Throat Culture: 0

## 2023-10-29 NOTE — Result Encounter Note (Signed)
Left detailed message for patient.

## 2023-10-30 ENCOUNTER — Encounter: Payer: Self-pay | Admitting: Primary Care

## 2023-10-30 NOTE — Progress Notes (Signed)
 Home health certification and plan of care for that.  October 17, 2023 through December 15, 2023 was reviewed.  Agree with need for skilled nursing and physical therapy evaluation and management for diagnosis of orthostatic hypotension and depression

## 2023-11-01 LAB — 5 HIAA, QUANTITATIVE, URINE
Collection Period: 24 h
Collection Period: 24 mg/L
Creat,Ur 24hr: 1932 mg/d (ref 1000–2500)
Date Start: 69 mg/dL
Time End: 7
Time Start: 7
Time Start: 7 mg/g{creat} (ref 0–14)
Total Volume,UR: 2800 mL
Total Volume,UR: 2800 mg/d (ref 0–15)

## 2023-11-01 LAB — CATECHOLAMINES, FRACTIONATED, URINE
Dopamine,Ur: 151 ug/d (ref 71–485)
Dopamine/Creat: 78 ug/g{creat} (ref 0–250)
Dopamine/Vol: 54 ug/L
Epinephrine,Ur: 3 ug/d (ref 1–14)
Epinephrine/Creat: 1 ug/g{creat} (ref 0–20)
Epinephrine/Vol: 1 ug/L
Norepinephrine,Ur: 48 ug/d (ref 14–120)
Norepinephrine/Creat: 25 ug/g{creat} (ref 0–45)
Norepinephrine/Vol: 17 ug/L

## 2023-11-02 NOTE — Result Encounter Note (Signed)
 Left detailed message for patient.

## 2023-11-06 ENCOUNTER — Ambulatory Visit
Admission: RE | Admit: 2023-11-06 | Discharge: 2023-11-06 | Disposition: A | Discharge status: Home / Self Care | Payer: Medicare Other | Source: Ambulatory Visit | Attending: Otolaryngology | Admitting: Otolaryngology

## 2023-11-06 ENCOUNTER — Ambulatory Visit: Payer: Medicare Other | Attending: Otolaryngology | Admitting: Speech-Language Pathologist

## 2023-11-06 ENCOUNTER — Other Ambulatory Visit: Payer: Self-pay

## 2023-11-06 DIAGNOSIS — C099 Malignant neoplasm of tonsil, unspecified: Secondary | ICD-10-CM | POA: Insufficient documentation

## 2023-11-06 DIAGNOSIS — R1312 Dysphagia, oropharyngeal phase: Secondary | ICD-10-CM | POA: Insufficient documentation

## 2023-11-06 DIAGNOSIS — C109 Malignant neoplasm of oropharynx, unspecified: Secondary | ICD-10-CM | POA: Insufficient documentation

## 2023-11-06 NOTE — Procedures (Signed)
 [facility] Speech Language Pathology  MODIFIED BARIUM SWALLOW STUDY    Patient Name: James Oneill    Date of Birth: 1976/02/14    MRN: [id number]  Referring Physician:Dinardo, Leotis Shames, MD  Primary Diagnosis:   1. Tonsillar cancer        2. Oropharyngeal dysphagia          Date of Exam: 11/06/2023  Onset Date: 11/06/23    Plan of Care Date: 11/06/23   Medicare Recertification Date: 02/04/2024    REASON FOR REFERRAL  James Oneill is a 48 y.o. male who was referred by Jules Schick, MD for a Modified Barium Swallow Study study at [facility] to instrumentally assess the swallowing mechanism. He was seen in conjunction with Radiology.    He complains of difficulty swallowing food . When it is difficult to get solid food down, he swallows something like pudding or jell-o and this helps, but notes that whatever the last bite of solid food is for hime to swallow tends to get stuck in his throat.     He has noticed these symptoms for the past several months ith increasing severity.  He reports denies recent Upper Respiratory Infection  James Oneill denies weight loss.  He has not been receiving speech language pathology services. James Oneill's functional outcome goal today is to determine cause of dysphagia.     PAST MEDICAL HISTORY  Past Medical History:   Diagnosis Date    Cancer     Throat    Depression     Hypertension     MVP (mitral valve prolapse)     Orthostatic hypotension        Current Outpatient Medications:     QUEtiapine (SEROQUEL) 25 mg tablet, Take 1 tablet (25 mg total) by mouth nightly., Disp: 30 tablet, Rfl: 0    carvedilol (COREG) 6.25 mg tablet, Take 1 tablet (6.25 mg total) by mouth 2 times daily (with meals) for High Blood Pressure., Disp: 60 tablet, Rfl: 3    cyclobenzaprine (FLEXERIL) 5 mg tablet, Take 1 tablet (5 mg total) by mouth 3 times daily as needed for Muscle spasms., Disp: 30 tablet, Rfl: 2    Non-System Medication, Medication/Supply: below knee compression stockings Directions for  Use: remove at night, Disp: 1 each, Rfl: 0    atorvastatin (LIPITOR) 40 mg tablet, Take 1 tablet (40 mg total) by mouth daily., Disp: 90 tablet, Rfl: 3    fenofibrate (TRICOR) 145 mg tablet, Take 1 tablet (145 mg total) by mouth daily., Disp: 30 tablet, Rfl: 0    FLUoxetine (PROZAC) 40 mg capsule, Take 1 capsule (40 mg total) by mouth daily., Disp: 30 capsule, Rfl: 2    HYDROcodone-acetaminophen (NORCO) 5-325 mg per tablet, Take 1 tablet by mouth every 6 hours as needed for Pain. Max daily dose: 4 tablets, Disp: 28 tablet, Rfl: 0    levothyroxine (SYNTHROID, LEVOTHROID) 25 mcg tablet, TAKE 1 TABLET BY MOUTH EVERY DAY BEFORE BREAKFAST, Disp: 30 tablet, Rfl: 2    mupirocin (BACTROBAN) 2 % ointment, Apply topically 3 times daily for nasal vestibulitis. to the following a2reas: nasal vestibule, Disp: 22 g, Rfl: 3    esomeprazole magnesium (NEXIUM) 40 mg capsule, Take 1 capsule (40 mg total) by mouth daily (before breakfast)., Disp: 90 capsule, Rfl: 2    gabapentin 600 mg tablet, Take 1 tablet (600 mg total) by mouth 3 times daily., Disp: 270 tablet, Rfl: 2    fluticasone (FLONASE) 50 MCG/ACT nasal spray,  Spray 1 spray into nostril daily, Disp: 16 g, Rfl: 5  Shellfish-derived products    CURRENT RESIDENCE:   Home independently    DIETARY STATUS:  Current Nutritional Status: By mouth  Current Diet: thin liquid and regular solid  Current Administration of Medications: Medications whole one at a time  Current PO Administration: Independently self feeds    RESPIRATORY STATUS:  Room air    CRANIAL NERVE EXAM  - Limited assessment due to Pt's medical condition: No    Cranial Nerve V, VII    Facial Sensation    Opthalmic (V1): WFL   Maxillary (V2): WFL   Mandibular (V3): Reduced left- marg mandibular nerve weak on left   Facial Motor    Nasolabial fold at rest: WFL   Nasolabial fold upon movement:  WFL   Brow: WFL   Lingual Sensation: Intact bilaterally   Labial symmetry upon Retraction: Asymmetric- marg mandibular nerve wean  on L   Labial Strength: Reduced seal with resistance on left   Mandible Range of Motion (ROM):  Full   Mandible Strength: WFL   Cranial Nerve IX/X    Gag Reflex:  Unable to assess   Velum:  Symmetric at rest and Symmetric on phonation   Volitional Cough: Strong   Vocal Quality:  WFL   Resonance:  WFL   Volitional Dry Swallow/Laryngeal Movement:  WFL   Cranial Nerve XI    Shoulder Shrug: WFL   Head Rotation:  Full range of motion   Cranial Nerve XII    Lingual ROM:  Tongue deviation to the left   Lingual Strength:  Reduced left   General Oral Cavity Inspection     Dentition:  Natural dentition   Mandible:  WNL   Oral hygiene and Saliva Control:  Thin, clear saliva; well-managed   Reflex Testing:  Not Observed     SPEECH  WFL    VOICE  WFL    MODIFIED BARIUM SWALLOW STUDY  Positional view: Lateral and AP  Consistencies administered: thin liquid, pureed (Level 4), regular solid, and Tablet/pill  Method of Delivery: Spoon, Cup, and By hand    Clinical Findings      Oral Prepatory Phase:     Labial Seal:  WFL   Mastication: WFL   Bolus Formulation: WFL   Oral Phase:     Posterior Oral Containment: WFL   Anterior-Posterior Transit: WFL   Oral Clearance WFL   Pharyngeal Phase       Swallow Initiation: TIMELY: -- Valleculae-(Thin liquids, Pureed (Level 4), Regular solids)   Velopharyngeal Closure: Complete   Hyoid Movement: Reduced anteriorly and Reduced elevation   Tongue Base Retraction: Reduced   Epiglottic Inversion: Partial and Variable   Pharyngeal Wall Contraction: Reduced   Pharyngeal Residue: Eisenhuber Scale   Eisenhuber et al. (2002)   (pharyngeal residue of valleculae and pyriforms sinuses after swallow)  Consistency Valleculae Pyriform Sinuses    Thin liquids 1 1   Mildly thick liquids     Moderately thick liquids     Puree 1 1   Soft solids     Solids 3 2   Barium tablet       0 = no residue  1 = <25% of the height of the space  2 = 25-50% of the height of the space  3 = >50% of the height of the space    Penetration and Aspiration: No airway penetration or aspiration  Penetration-Aspiration Scale  J.C. Rosenbek et al, Dysphagia 11:93-98 (  April 1996).  (1 = no penetration/aspiration, 8 = silent aspiration)    Consistency Score / Description   Thin liquid 1 - Material does not enter the airway.   Pureed  1 - Material does not enter the airway.   Regular solid 1 - Material does not enter the airway.   Tablet/pill 1 - Material does not enter the airway.          Upper Esophageal Sphincter: Perhaps very small non-obstructive CP bar   Other Anatomical Findings: Post-surgical changes noted- surgical clips   Esophageal Phase    Esophageal clearance Upright Position WFL     A full barium pill was given with water.Results revealed hold-up in the valleculae.    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 Pt with reduced pharyngeal driving pressure lending to poor clearance of food and liquids from pharynx resulting in residue. No significant area of narrowing to suggest dilation would be an effective intervention at this juncture. SLP recommends swallowing therapy. SLP support to contact pt to schedule.    RECOMMENDATIONS  Diet: Regular (Level 7) and Thin liquids  Administration of Medications: Medications whole one at a time  Aspiration Precautions: Alternate solids/liquids, Double swallow, Effortful swallow, and Slow rate  Reflux Precautions: As per Physician Instruction  Patient may benefit from the following consults/services: Dysphagia therapy    Speech language pathology services are indicated to target: Education, Strategy implementation, and Strengthening     Results and recommendations were reviewed. All questions were answered within the Speech-Language Pathologist's scope of practice to  the patient's satisfaction.     Thank you for this referral. Please call [phone] with any questions.    PLAN OF CARE    Referring Physician: Jules Schick, MD    I have reviewed your evaluation and agree wit

## 2023-11-08 ENCOUNTER — Other Ambulatory Visit: Payer: Self-pay

## 2023-11-08 ENCOUNTER — Encounter: Payer: Self-pay | Admitting: Primary Care

## 2023-11-08 ENCOUNTER — Ambulatory Visit: Payer: Medicare Other | Attending: Primary Care | Admitting: Primary Care

## 2023-11-08 VITALS — BP 144/102 | HR 85 | Temp 98.0°F | Ht 70.0 in | Wt 195.2 lb

## 2023-11-08 DIAGNOSIS — R0989 Other specified symptoms and signs involving the circulatory and respiratory systems: Secondary | ICD-10-CM | POA: Insufficient documentation

## 2023-11-08 DIAGNOSIS — E785 Hyperlipidemia, unspecified: Secondary | ICD-10-CM | POA: Insufficient documentation

## 2023-11-08 DIAGNOSIS — F419 Anxiety disorder, unspecified: Secondary | ICD-10-CM | POA: Insufficient documentation

## 2023-11-08 DIAGNOSIS — G540 Brachial plexus disorders: Secondary | ICD-10-CM | POA: Insufficient documentation

## 2023-11-08 NOTE — Progress Notes (Signed)
 Patient ID: James Oneill is a 48 y.o. year old male.    Chief Complaint   Patient presents with    Follow-up     2 week     HPI:  James Oneill is here today for reevaluation of his labile hypertension history of syncope anxiety and chronic left neck pain  Lab work below was reviewed with him.  He is now wearing compression stockings.  Although he still gets lightheaded he rises from lying or sitting too rapidly he has not had any further syncopal episodes.  He believes the quetiapine is helping his anxiety and depression.  He finds himself smiling at things now.  Continues to have chronic left neck and shoulder pain.  However does not take hydrocodone on a daily basis as it tends to cause constipation      The medication and allergy list was reviewed and is accurate  Allergy / Social History / Medications:  Allergies   Allergen Reactions    Shellfish-Derived Products Hives     Social History     Tobacco Use    Smoking status: Never    Smokeless tobacco: Never   Substance Use Topics    Alcohol use: Yes     Alcohol/week: 1.0 standard drink of alcohol     Types: 1 Glasses of wine per week     Patient's Medications   New Prescriptions    No medications on file   Previous Medications    ATORVASTATIN (LIPITOR) 40 MG TABLET    Take 1 tablet (40 mg total) by mouth daily.    CARVEDILOL (COREG) 6.25 MG TABLET    Take 1 tablet (6.25 mg total) by mouth 2 times daily (with meals) for High Blood Pressure.    CYCLOBENZAPRINE (FLEXERIL) 5 MG TABLET    Take 1 tablet (5 mg total) by mouth 3 times daily as needed for Muscle spasms.    ESOMEPRAZOLE MAGNESIUM (NEXIUM) 40 MG CAPSULE    Take 1 capsule (40 mg total) by mouth daily (before breakfast).    FENOFIBRATE (TRICOR) 145 MG TABLET    Take 1 tablet (145 mg total) by mouth daily.    FLUOXETINE (PROZAC) 40 MG CAPSULE    Take 1 capsule (40 mg total) by mouth daily.    FLUTICASONE (FLONASE) 50 MCG/ACT NASAL SPRAY    Spray 1 spray into nostril daily    GABAPENTIN 600 MG TABLET    Take 1  tablet (600 mg total) by mouth 3 times daily.    HYDROCODONE-ACETAMINOPHEN (NORCO) 5-325 MG PER TABLET    Take 1 tablet by mouth every 6 hours as needed for Pain. Max daily dose: 4 tablets    LEVOTHYROXINE (SYNTHROID, LEVOTHROID) 25 MCG TABLET    TAKE 1 TABLET BY MOUTH EVERY DAY BEFORE BREAKFAST    MUPIROCIN (BACTROBAN) 2 % OINTMENT    Apply topically 3 times daily for nasal vestibulitis. to the following a2reas: nasal vestibule    NON-SYSTEM MEDICATION    Medication/Supply: below knee compression stockings  Directions for Use: remove at night    QUETIAPINE (SEROQUEL) 25 MG TABLET    Take 1 tablet (25 mg total) by mouth nightly.   Modified Medications    No medications on file   Discontinued Medications    No medications on file          Physical Exam:  Vitals:    11/08/23 1123   BP: (!) 144/102   Pulse: 85   Temp: 36.7 C (98 F)  Weight: 88.5 kg (195 lb 3.2 oz)   Height: 1.778 m (5\' 10" )     SpO2 Readings from Last 3 Encounters:   11/08/23 97%   10/26/23 97%   10/23/23 98%      Estimated body mass index is 28.01 kg/m as calculated from the following:    Height as of this encounter: 1.778 m (5\' 10" ).    Weight as of this encounter: 88.5 kg (195 lb 3.2 oz).  BP Readings from Last 3 Encounters:   11/08/23 (!) 144/102   10/26/23 (!) 150/105   10/23/23 (!) 180/98     Wt Readings from Last 3 Encounters:   11/08/23 88.5 kg (195 lb 3.2 oz)   10/26/23 87.3 kg (192 lb 6.4 oz)   10/23/23 86.2 kg (190 lb)     Looks well.  Not in any acute distress  Neck-supple no adenopathy or thyromegaly  Cardiovascular-heart rate regular.  Heart sounds 1 and 2 with no added sounds no carotid bruits no edema  Respiratory-normal respiratory effort.  Good bilateral air entry.  Lungs are clear to palpation percussion and auscultation    Recent Lab Results:   Latest Reference Range & Units 10/28/23 07:00   Dopamine/Creat 0 - 250 ug/g CRT 78   Epinephrine/Creat 0 - 20 ug/g CRT 1   Date Start  10/27/2023   Time Start  07 00   Date End   10/28/2023   Time End  07 00   Collection Period hr 24.0 (C)   Total Volume,UR mL 2,800   Creat,Ur mg/dL 69   Creat,Ur 16XW 9,604 - 2,500 mg/d 1,932 (C)   (C): Corrected      Assessment and Plan:     1.  Labile hypertension.  Blood pressure today is more acceptable no adjustments made  2.  Orthostatic hypotension.  Continue with compression stockings.  Again reminded the importance of taking precautions when rising from lying or sitting  3.  Improved anxiety.  Continue with quetiapine  4.  Hyperlipidemia.  Check lipids before next visit      Continue current medications low-fat low-salt diet    Plan follow-up 1 month  Orders this visit  Orders Placed This Encounter   Procedures    Lipid Panel (Reflex to Direct  LDL if Triglycerides more than 400)    Comprehensive metabolic panel

## 2023-11-12 ENCOUNTER — Encounter: Payer: Self-pay | Admitting: Gastroenterology

## 2023-11-13 ENCOUNTER — Encounter: Payer: Self-pay | Admitting: Gastroenterology

## 2023-11-13 ENCOUNTER — Ambulatory Visit: Payer: Medicare Other | Admitting: Speech-Language Pathologist

## 2023-11-13 DIAGNOSIS — C099 Malignant neoplasm of tonsil, unspecified: Secondary | ICD-10-CM

## 2023-11-13 DIAGNOSIS — R1312 Dysphagia, oropharyngeal phase: Secondary | ICD-10-CM

## 2023-11-13 NOTE — Progress Notes (Signed)
 Department of Speech Pathology    Telephone Encounter    Pt arrived for telemed appt early this date. SLP dialed into Zoom telemed video encounter at 10:35 but pt was no longer on the call. Mychart message was sent with no response. At 10:41, SLP called pt to request he dial back in for therapy session. Slp apologized for dialing in 5 minutes late to appt. James Oneill let clinician know that he had scheduled an OT appt very close to the video SLP appt and he no longer had time to see SLP this date. He will look ahead at future visits to ensure that he has a full hour allotted to SLP appts.  SLP will see pt on 2/4.    Thank you.     Tomi Likens, M.S., CCC-SLP  she/her  Speech Language Pathology  Department of Otolaryngology  Phone: [phone]

## 2023-11-20 ENCOUNTER — Ambulatory Visit: Payer: Medicare Other | Admitting: Speech-Language Pathologist

## 2023-11-20 ENCOUNTER — Ambulatory Visit: Payer: Medicare Other | Admitting: Primary Care

## 2023-11-21 ENCOUNTER — Other Ambulatory Visit: Payer: Self-pay | Admitting: Primary Care

## 2023-11-22 ENCOUNTER — Telehealth: Payer: Self-pay | Admitting: Primary Care

## 2023-11-22 NOTE — Telephone Encounter (Signed)
 FYI

## 2023-11-22 NOTE — Telephone Encounter (Signed)
Irving Burton, [facility]     -States she has discharged patient from OT and the [facility]. She states he is doing well at home and is moving around in the community.

## 2023-11-26 ENCOUNTER — Encounter: Payer: Self-pay | Admitting: Gastroenterology

## 2023-11-27 ENCOUNTER — Ambulatory Visit: Payer: Medicare Other | Admitting: Speech-Language Pathologist

## 2023-11-28 ENCOUNTER — Ambulatory Visit: Payer: Medicare Other | Admitting: Cardiology

## 2023-11-30 ENCOUNTER — Other Ambulatory Visit: Payer: Self-pay

## 2023-11-30 ENCOUNTER — Other Ambulatory Visit: Payer: Self-pay | Admitting: Primary Care

## 2023-11-30 ENCOUNTER — Ambulatory Visit: Payer: Medicare Other | Attending: Cardiology

## 2023-11-30 VITALS — BP 130/102 | HR 83 | Ht 70.0 in | Wt 203.0 lb

## 2023-11-30 DIAGNOSIS — E785 Hyperlipidemia, unspecified: Secondary | ICD-10-CM

## 2023-11-30 DIAGNOSIS — I10 Essential (primary) hypertension: Secondary | ICD-10-CM

## 2023-11-30 NOTE — Progress Notes (Signed)
[facility]        Cardiology Office Revisit Note    Date of Visit: 11/30/2023 Patient: James Oneill   Patients PCP: Eustace Quail, MD Patient DOB: Dec 14, 1975  EMRN: [id number]     Subjective/Reason For Visit     I had the pleasure of seeing James Oneill in cardiology followup on 11/30/2023. 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    History of Present Illness    The patient came in to discuss issues with managing his blood pressure.    He mentioned that he still feels dizzy and sees floaters in his vision. His blood pressure has been all over the place, ranging from as low as 71/50 to as high as 130/100. During a recent visit from a nurse, it even shot up to 160/110. He's been trying to manage these swings by moving more slowly and drinking water, especially in the morning. He usually checks his blood pressure around lunchtime. He uses a newer blood pressure machine which he thinks works better when used without clothes. He noticed that his blood pressure goes up when he's doing things like helping his mom with groceries or feeding the dog, and also when he's stressed. His pulse rate tends to go up while his blood pressure drops. He drinks about four bottles of water a day, including one overnight because his mouth gets dry from taking quetiapine. He wakes up around 8 AM and takes his medications, including levothyroxine, in the morning, with his second dose of Carvedilol at dinner around 5:30 PM. He takes quetiapine just before bed. He used to check his blood pressure in different positions (lying, sitting, standing) because of orthostatic hypotension but stopped after his nurse visits ended. He now takes pictures of his blood pressure readings. He gets short of breath when exerting himself and has two types of chest pain:  one that he thinks is related to his mitral valve prolapse and another that comes with a fast heart rate. He hasn't noticed any big changes in his heart condition, which he describes as still erratic. 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     He's gained weight and now weighs 203 pounds, which is 60 pounds more than his healthy weight of 155 pounds. He has unexplained pains and takes quetiapine at night for anxiety, which has kept him from going to the gym and led to a more sedentary lifestyle. He's worried about how hard it will be to lose the extra weight. He's been on a low dose of levothyroxine for about 5 to 6 months and thinks it might be causing the weight gain. He plans to get blood work done before his next appointment.    He's been dealing with constipation and takes 3 to 4 Senokot tablets at night because he doesn't feel the urge to go to the bathroom. He adds a scoop of MiraLAX to his coffee every morning, which results in one soft bowel movement.    He has had multiple episodes of fainting, which have caused facial injuries and unconsciousness. He has radiation-induced brachial plexopathy and isn't sure if his thyroid or vagal nerve were affected. He has trouble sleeping because he frequently changes positions and wakes up often.        Past Medical History:   Diagnosis Date  Cancer     Throat    Depression     Hypertension     MVP (mitral valve prolapse)     Orthostatic hypotension      Past Surgical History:   Procedure Laterality Date    ELBOW FRACTURE SURGERY      GALLBLADDER SURGERY  2019    HERNIA REPAIR  2019    umbilical    NECK SURGERY Left 2018    radical dissection    TONSILLECTOMY  2017    SCC Left tonsil     Review of Systems   Constitutional:  Negative for malaise/fatigue.   Respiratory:  Positive for shortness of breath.    Cardiovascular:  Positive for chest pain and palpitations. Negative for orthopnea, claudication, leg swelling and PND.    Gastrointestinal:  Positive for abdominal pain.   Neurological:  Positive for dizziness. Negative for loss of consciousness.   All other systems reviewed and are negative.    Medications     Current Outpatient Medications   Medication Sig    QUEtiapine (SEROQUEL) 25 mg tablet TAKE 1 TABLET BY MOUTH NIGHTLY    carvedilol (COREG) 6.25 mg tablet Take 1 tablet (6.25 mg total) by mouth 2 times daily (with meals) for High Blood Pressure.    cyclobenzaprine (FLEXERIL) 5 mg tablet Take 1 tablet (5 mg total) by mouth 3 times daily as needed for Muscle spasms.    Non-System Medication Medication/Supply: below knee compression stockings  Directions for Use: remove at night    atorvastatin (LIPITOR) 40 mg tablet Take 1 tablet (40 mg total) by mouth daily.    fenofibrate (TRICOR) 145 mg tablet Take 1 tablet (145 mg total) by mouth daily.    FLUoxetine (PROZAC) 40 mg capsule Take 1 capsule (40 mg total) by mouth daily.    HYDROcodone-acetaminophen (NORCO) 5-325 mg per tablet Take 1 tablet by mouth every 6 hours as needed for Pain. Max daily dose: 4 tablets    levothyroxine (SYNTHROID, LEVOTHROID) 25 mcg tablet TAKE 1 TABLET BY MOUTH EVERY DAY BEFORE BREAKFAST    mupirocin (BACTROBAN) 2 % ointment Apply topically 3 times daily for nasal vestibulitis. to the following a2reas: nasal vestibule    esomeprazole magnesium (NEXIUM) 40 mg capsule Take 1 capsule (40 mg total) by mouth daily (before breakfast).    gabapentin 600 mg tablet Take 1 tablet (600 mg total) by mouth 3 times daily.    fluticasone (FLONASE) 50 MCG/ACT nasal spray Spray 1 spray into nostril daily     Vitals and Physical Exam     Dj's  height is 1.778 m (5\' 10" ) and weight is 92.1 kg (203 lb). His blood pressure is 130/102 (abnormal) and his pulse is 83. His oxygen saturation is 94%.  Body mass index is 29.13 kg/m.    Physical Exam  Vitals and nursing note reviewed.   Constitutional:       General: He is not in acute distress.     Appearance: Normal appearance.  He is not ill-appearing.   HENT:      Head: Normocephalic and atraumatic.   Pulmonary:      Effort: Pulmonary effort is normal. No respiratory distress.   Skin:     General: Skin is warm and dry.   Neurological:      Mental Status: He is alert and oriented to person, place, and time.   Psychiatric:         Mood and Affect: Mood normal.  Behavior: Behavior normal.       Laboratory Data     Hematology:   Results in Past 730 Days  Result Component Current Result Previous Result   WBC 4.8 (10/22/2023) 4.8 (10/07/2023)   Hemoglobin 13.9 (10/22/2023) 13.8 (10/07/2023)   Hematocrit 40 (10/22/2023) 39 (10/07/2023)   Platelets 284 (10/22/2023) 246 (10/07/2023)     Chemistry:   Results in Past 730 Days  Result Component Current Result Previous Result   Sodium 138 (10/08/2023) 140 (10/07/2023)   Potassium 4.1 (10/08/2023) 3.8 (10/07/2023)   Creatinine 1.38 (H) (10/22/2023) 1.36 (H) (10/08/2023)   Glucose 97 (10/08/2023) 123 (H) (10/07/2023)   Calcium 9.7 (10/08/2023) 9.4 (10/07/2023)   Magnesium 1.7 (10/08/2023) 1.8 (10/07/2023)   Hemoglobin A1C 5.6 (10/08/2023) Not in Time Range   AST 34 (10/05/2023) 49 (08/20/2023)   ALT 43 (10/05/2023) 51 (H) (08/20/2023)   TSH 3.05 (10/05/2023) 2.16 (08/20/2023)     Coagulation Studies:   Results in Past 730 Days  Result Component Current Result Previous Result   INR 1.0 (10/05/2023) Not in Time Range     Cardiac:   Results in Past 730 Days  Result Component Current Result Previous Result   TROP T 0 HR High Sensitivity <6 (10/22/2023) <6 (10/05/2023)   TROP T 1 HR High Sensitivity <6 (10/22/2023) <6 (10/05/2023)   TROP T 3 HR High Sensitivity 7 (06/30/2022) Not in Time Range   NT-pro BNP <50 (10/05/2023) Not in Time Range     Lipids:   Results in Past 730 Days  Result Component Current Result Previous Result   Cholesterol 182 (08/20/2023) 316 (!) (05/23/2023)   HDL 40 (08/20/2023) 34 (L) (05/23/2023)   Triglycerides 429 (!) (08/20/2023) 1,017 (!) (05/23/2023)   LDL Calculated see below (16/10/958) see below  (05/23/2023)   Chol/HDL Ratio 4.6 (08/20/2023) 9.3 (05/23/2023)     Cardiac/Imaging Data & Risk Scores              DOBUTAMINE STRESS ECHO COMPLETE 10/09/2023    Interpretation Summary  1.  Normal LV size, mass, and EF without regional wall motion abnormalities  2.  Trace aortic valve regurgitation  3.  Borderline mild bi-leaflet mitral valve prolapse with trace mitral valve regurgitation  4.  Normal right heart size and function with indeterminate pulmonary artery systolic pressure  5.  Dobutamine-atropine protocol discontinued due to associated hypotensive BP response  6.  No ECG or echocardiographic evidence of ischemia at the low to moderate cardiac workload achieved.  The sensitivity of this test to detect obstructive coronary disease is reduced due to the submaximal cardiac workload achieved.               EPATCH 7-DAY MONITOR 11/06/2022    Narrative  ePatch 7 day monitoring:    Patient monitored for 6d 22h, analyzable time was 6d 21h starting on 10/23/2022  Primary rhythm was Sinus Rhythm. Average heart rate was 68 bpm (range 53 - 158 bpm).  SVE(s): Burden was 0.02 %, 142 total SVE(s)  SVT (AT, RT): 1 events, longest event 14 beats & fastest event 115 bpm  PVC(s): Burden was 0.01 %, 79 total PVC(s), 2 disparate morphologies             Impression and Plan     Patient Active Problem List   Diagnosis Code    Tonsillar cancer C09.9    History of cholecystectomy Z90.49    MVP (mitral valve prolapse) I34.1    Depression F32.A    Neuropathy G62.9  Chronic fatigue R53.82    Hypertension I10    ED (erectile dysfunction) N52.9    monitoring for cancer therapy related  cardiac dysfunction Z91.89    Syncope and collapse R55    Dyslipidemia E78.5    Hypothyroid E03.9     Assessment & Plan    1 - Blood pressure management - His blood pressure readings have been inconsistent, with some measurements indicating hypotension and others suggesting hypertension. The potential impact of thyroid function on blood pressure regulation  was discussed. He was advised to continue monitoring his blood pressure twice daily at varying times and to maintain a record of these readings. We discussed that it may be beneficial to have more blood pressure readings at various times to see if his difficulties are throughout the day or if they are tied to certain periods. No alterations to his current medication regimen were made during this visit. He has been taking his Levothyroxine with all of his other medications in the morning though and I have asked him to ensure that he separates out that medication from the rest and takes it at least thirty minutes prior to the others. Following the testing we will see them back in the office for reevaluation and to determine next steps of care.  Patient was agreeable to this plan and will reach out if they have any further questions or concerns prior to their visit.    2 - Dyslipidemia - Continue with current medical therapy and no changes necessary at this time.    Dyslipidemia Medications       HMG CoA Reductase Inhibitors       atorvastatin (LIPITOR) 40 mg tablet Take 1 tablet (40 mg total) by mouth daily.       Fibric Acid Derivatives       fenofibrate (TRICOR) 145 mg tablet Take 1 tablet (145 mg total) by mouth daily.           3 - Valvular Heart Disease - [ Mitral Valve Prolapse / Regurgitation ] - We will continue to monitor with surveillance echocardiograms and will plan to auscultate at next visit.      We will follow-up next with the patient for a blood pressure check. Patient of Dr Robley Fries    Creation of this note may have been assisted through dictation using [employer] voice recognition software and/or [employer] which is an Engineer, building services. A reasonable effort was made to proofread this note but there may be minor transcription/typographical errors.      Nyoka Cowden, PA  Electronically signed on 11/30/2023 at 12:00 PM.

## 2023-12-03 MED ORDER — CYCLOBENZAPRINE HCL 5 MG PO TABS *I*
5.0000 mg | ORAL_TABLET | Freq: Three times a day (TID) | ORAL | 2 refills | Status: DC | PRN
Start: 2023-12-03 — End: 2024-01-02

## 2023-12-03 MED ORDER — HYDROCODONE-ACETAMINOPHEN 5-325 MG PO TABS *I*
1.0000 | ORAL_TABLET | Freq: Four times a day (QID) | ORAL | 0 refills | Status: DC | PRN
Start: 2023-12-03 — End: 2024-01-02

## 2023-12-03 NOTE — Telephone Encounter (Signed)
12/17 x 7 day

## 2023-12-06 ENCOUNTER — Ambulatory Visit: Payer: Medicare Other | Admitting: Speech-Language Pathologist

## 2023-12-12 ENCOUNTER — Other Ambulatory Visit: Payer: Self-pay | Admitting: Primary Care

## 2023-12-13 ENCOUNTER — Other Ambulatory Visit: Payer: Self-pay | Admitting: Primary Care

## 2023-12-15 ENCOUNTER — Other Ambulatory Visit: Payer: Self-pay | Admitting: Otolaryngology

## 2023-12-17 ENCOUNTER — Ambulatory Visit: Payer: Medicare Other | Admitting: Primary Care

## 2023-12-19 ENCOUNTER — Other Ambulatory Visit: Payer: Self-pay

## 2023-12-19 ENCOUNTER — Encounter: Payer: Self-pay | Admitting: Primary Care

## 2023-12-19 ENCOUNTER — Ambulatory Visit: Attending: Primary Care | Admitting: Primary Care

## 2023-12-19 VITALS — BP 150/100 | HR 97 | Temp 97.3°F | Ht 70.0 in | Wt 197.6 lb

## 2023-12-19 DIAGNOSIS — J069 Acute upper respiratory infection, unspecified: Secondary | ICD-10-CM | POA: Insufficient documentation

## 2023-12-19 MED ORDER — BENZONATATE 100 MG PO CAPS *I*
100.0000 mg | ORAL_CAPSULE | Freq: Three times a day (TID) | ORAL | 0 refills | Status: DC | PRN
Start: 2023-12-19 — End: 2023-12-26

## 2023-12-19 NOTE — Progress Notes (Signed)
 Patient ID: James Oneill is a 48 y.o. year old male.    Chief Complaint   Patient presents with    Other     Runny nose, ear pain and sore throat     HPI: James Oneill has felt unwell for the last 3 days with nasal congestion and a cough productive of green sputum.  No fever no chills.  No chest pain or dyspnea        The medication and allergy list was reviewed and is accurate  Allergy / Social History / Medications:  Allergies[1]  Social History     Tobacco Use    Smoking status: Never    Smokeless tobacco: Never   Substance Use Topics    Alcohol use: Yes     Alcohol/week: 1.0 standard drink of alcohol     Types: 1 Glasses of wine per week     Patient's Medications   New Prescriptions    BENZONATATE (TESSALON) 100 MG CAPSULE    Take 1 capsule (100 mg total) by mouth 3 times daily as needed for Cough.  Swallow whole. Do not crush or chew.   Previous Medications    ATORVASTATIN (LIPITOR) 40 MG TABLET    Take 1 tablet (40 mg total) by mouth daily.    CARVEDILOL (COREG) 6.25 MG TABLET    Take 1 tablet (6.25 mg total) by mouth 2 times daily (with meals) for High Blood Pressure.    CYCLOBENZAPRINE (FLEXERIL) 5 MG TABLET    Take 1 tablet (5 mg total) by mouth 3 times daily as needed for Muscle spasms.    ESOMEPRAZOLE MAGNESIUM (NEXIUM) 40 MG CAPSULE    Take 1 capsule (40 mg total) by mouth daily (before breakfast).    FENOFIBRATE (TRICOR) 145 MG TABLET    Take 1 tablet (145 mg total) by mouth daily.    FLUOXETINE (PROZAC) 40 MG CAPSULE    TAKE 1 CAPSULE BY MOUTH EVERY DAY    FLUTICASONE (FLONASE) 50 MCG/ACT NASAL SPRAY    Spray 1 spray into nostril daily    GABAPENTIN 600 MG TABLET    Take 1 tablet (600 mg total) by mouth 3 times daily.    HYDROCODONE-ACETAMINOPHEN (NORCO) 5-325 MG PER TABLET    Take 1 tablet by mouth every 6 hours as needed for Pain. Max daily dose: 4 tablets    LEVOTHYROXINE (SYNTHROID, LEVOTHROID) 25 MCG TABLET    TAKE 1 TABLET BY MOUTH EVERY DAY BEFORE BREAKFAST    MUPIROCIN (BACTROBAN) 2 % OINTMENT     APPLY TOPICALLY THREE TIMES DAILY TO NASAL VESTIBULE    NON-SYSTEM MEDICATION    Medication/Supply: below knee compression stockings  Directions for Use: remove at night    QUETIAPINE (SEROQUEL) 25 MG TABLET    TAKE 1 TABLET BY MOUTH NIGHTLY   Modified Medications    No medications on file   Discontinued Medications    No medications on file          Physical Exam:  Vitals:    12/19/23 1147   BP: (!) 200/170   Pulse: 97   Temp: 36.3 C (97.3 F)   Weight: 89.6 kg (197 lb 9.6 oz)   Height: 1.778 m (5\' 10" )     SpO2 Readings from Last 3 Encounters:   12/19/23 97%   11/30/23 94%   11/08/23 97%      Estimated body mass index is 28.35 kg/m as calculated from the following:    Height as of this encounter:  1.778 m (5\' 10" ).    Weight as of this encounter: 89.6 kg (197 lb 9.6 oz).  BP Readings from Last 3 Encounters:   12/19/23 (!) 200/170   11/30/23 (!) 130/102   11/08/23 (!) 144/102     Wt Readings from Last 3 Encounters:   12/19/23 89.6 kg (197 lb 9.6 oz)   11/30/23 92.1 kg (203 lb)   11/08/23 88.5 kg (195 lb 3.2 oz)     Looks well.  Not in any acute distress  ENT-normal.  No sinus tenderness  Neck-supple no adenopathy or thyromegaly  Cardiovascular-heart rate regular.  Heart sounds 1 and 2 with no added sounds no carotid bruits no edema  Respiratory-normal respiratory effort.  Good bilateral air entry.  Lungs are clear to palpation percussion and auscultation    Recent Lab Results:none      Assessment and Plan:     Respiratory infection.  Discussed symptomatic treatment.  Prescription for Jerilynn Som sent to pharmacy for him    Has plan to follow-up in 1 week.  Call sooner if concerns  Orders this visit  No orders of the defined types were placed in this encounter.         [1]   Allergies  Allergen Reactions    Shellfish-Derived Products Hives

## 2023-12-22 ENCOUNTER — Other Ambulatory Visit: Payer: Self-pay | Admitting: Primary Care

## 2023-12-23 NOTE — Telephone Encounter (Signed)
 Med Refill Note    Historical medication refill order pended     12/23/23  [name]  [birth year]  MRN: [id number]      Requested Prescriptions     Pending Prescriptions Disp Refills    levothyroxine (SYNTHROID, LEVOTHROID) 25 mcg tablet [Pharmacy Med Name: LEVOTHYROXINE 25 MCG TABLET] 30 tablet 2     Sig: TAKE 1 TABLET BY MOUTH EVERY DAY BEFORE BREAKFAST       Last Fill Date: 2.19.25  Last Order Date: 12.9.24      Last office visit with doctor:   12/19/2023  Last office visit with APP:   Visit date not found      Patients upcoming appointments:  Future Appointments   Date Time Provider Department Center   12/26/2023 10:30 AM Eustace Quail, MD [facility] None   01/04/2024  2:00 PM Nyoka Cowden, PA [facility]   03/14/2024 11:00 AM Sabescumar, Bryson Ha, MD [facility] None   04/07/2024 10:00 AM Daphene Jaeger, Oneta Rack, MD [facility] None   09/02/2024 10:30 AM Leighton Roach, MD [facility] None       Recent Lab results:  GENERAL CHEMISTRY   Recent Labs     10/22/23  1321 10/08/23  0434 10/07/23  0005 10/05/23  1525   NA  --  138 140 138   K  --  4.1 3.8 4.2   CL  --  103 103 100   CO2  --  25 29* 30*   GAP  --  10 8 8    UN  --  16 18 21*   CREAT 1.38* 1.36* 1.34* 1.32*   GLU  --  97 123* 97   CA  --  9.7 9.4 10.2      LIPID PROFILE   Recent Labs     08/20/23  1000 05/23/23  0933 01/01/23  1036   CHOL 182 316* 266*   TRIG 429* 1,017* 351*   HDL 40 34* 47   LDLC see below see below 149*      LIVER PROFILE   Recent Labs     10/05/23  1525 08/20/23  1000 05/23/23  0933   ALT 43 51* 40   AST 34 49 32   ALK 47 57 62   TB 1.0 1.6* 1.4*      DIABETES THYROID   Recent Labs     10/08/23  0434   HA1C 5.6    Recent Labs     10/05/23  1525 08/20/23  1000 05/23/23  0933   TSH 3.05 2.16 4.84*                   Simran Mannis Cherene Altes, RN

## 2023-12-24 ENCOUNTER — Ambulatory Visit: Admitting: Primary Care

## 2023-12-25 ENCOUNTER — Other Ambulatory Visit
Admission: RE | Admit: 2023-12-25 | Discharge: 2023-12-25 | Disposition: A | Discharge status: Home / Self Care | Source: Ambulatory Visit | Attending: Primary Care | Admitting: Primary Care

## 2023-12-25 DIAGNOSIS — E785 Hyperlipidemia, unspecified: Secondary | ICD-10-CM | POA: Insufficient documentation

## 2023-12-25 DIAGNOSIS — R7989 Other specified abnormal findings of blood chemistry: Secondary | ICD-10-CM | POA: Insufficient documentation

## 2023-12-25 LAB — COMPREHENSIVE METABOLIC PANEL
ALT: 50 U/L (ref 0–50)
AST: 41 U/L (ref 0–50)
Albumin: 5 g/dL (ref 3.5–5.2)
Alk Phos: 51 U/L (ref 40–130)
Anion Gap: 13 (ref 7–16)
Bilirubin,Total: 1 mg/dL (ref 0.0–1.2)
CO2: 25 mmol/L (ref 20–28)
Calcium: 9.9 mg/dL (ref 8.6–10.2)
Chloride: 102 mmol/L (ref 96–108)
Creatinine: 1.39 mg/dL — ABNORMAL HIGH (ref 0.67–1.17)
Glucose: 110 mg/dL — ABNORMAL HIGH (ref 60–99)
Lab: 18 mg/dL (ref 6–20)
Potassium: 4.6 mmol/L (ref 3.3–4.6)
Sodium: 140 mmol/L (ref 133–145)
Total Protein: 7.6 g/dL (ref 6.3–7.7)
eGFR BY CREAT: 62 *

## 2023-12-25 LAB — LIPID PANEL
Chol/HDL Ratio: 3.4
Cholesterol: 139 mg/dL
HDL: 41 mg/dL (ref 40–60)
LDL Calculated: 66 mg/dL
Non HDL Cholesterol: 98 mg/dL
Triglycerides: 195 mg/dL — AB

## 2023-12-25 LAB — TSH: TSH: 3.91 u[IU]/mL (ref 0.27–4.20)

## 2023-12-25 LAB — T4, FREE: Free T4: 1.3 ng/dL (ref 0.9–1.7)

## 2023-12-26 ENCOUNTER — Ambulatory Visit: Attending: Primary Care | Admitting: Primary Care

## 2023-12-26 ENCOUNTER — Encounter: Payer: Self-pay | Admitting: Primary Care

## 2023-12-26 ENCOUNTER — Other Ambulatory Visit: Payer: Self-pay

## 2023-12-26 VITALS — BP 125/90 | HR 86 | Temp 96.4°F | Ht 70.0 in | Wt 196.4 lb

## 2023-12-26 DIAGNOSIS — F419 Anxiety disorder, unspecified: Secondary | ICD-10-CM | POA: Insufficient documentation

## 2023-12-26 DIAGNOSIS — G540 Brachial plexus disorders: Secondary | ICD-10-CM | POA: Insufficient documentation

## 2023-12-26 DIAGNOSIS — R7989 Other specified abnormal findings of blood chemistry: Secondary | ICD-10-CM | POA: Insufficient documentation

## 2023-12-26 DIAGNOSIS — E785 Hyperlipidemia, unspecified: Secondary | ICD-10-CM | POA: Insufficient documentation

## 2023-12-26 DIAGNOSIS — R0989 Other specified symptoms and signs involving the circulatory and respiratory systems: Secondary | ICD-10-CM | POA: Insufficient documentation

## 2023-12-26 MED ORDER — QUETIAPINE FUMARATE 50 MG PO TABS *I*
50.0000 mg | ORAL_TABLET | Freq: Every evening | ORAL | 2 refills | Status: DC
Start: 2023-12-26 — End: 2024-03-18

## 2023-12-26 NOTE — Progress Notes (Signed)
 Patient ID: James Oneill is a 48 y.o. year old male.    Chief Complaint   Patient presents with    Follow-up     Four week followup     HPI: James Oneill is here today for evaluation of his labile essential hypertension elevated TSH, depression chronic brachial plexus neuropathy.  No chest pain palpitations dyspnea orthopnea PND  No further blackouts but he does report that he can get quite dizzy rising from lying or sitting too quickly or indeed in the shower if he bends his head to wash it.  Still feels depressed and anxious.  Is concerned about losing his disability [employer].  Does not sleep well at night because of the pain from his brachial plexus neuropathy which he describes a throbbing sensation affecting the left side of his neck and shoulder        The medication and allergy list was reviewed and is accurate  Allergy / Social History / Medications:  Allergies[1]  Social History     Tobacco Use    Smoking status: Never    Smokeless tobacco: Never   Substance Use Topics    Alcohol use: Yes     Alcohol/week: 1.0 standard drink of alcohol     Types: 1 Glasses of wine per week     Patient's Medications   New Prescriptions    QUETIAPINE (SEROQUEL) 50 MG TABLET    Take 1 tablet (50 mg total) by mouth nightly.   Previous Medications    ATORVASTATIN (LIPITOR) 40 MG TABLET    Take 1 tablet (40 mg total) by mouth daily.    CARVEDILOL (COREG) 6.25 MG TABLET    Take 1 tablet (6.25 mg total) by mouth 2 times daily (with meals) for High Blood Pressure.    CYCLOBENZAPRINE (FLEXERIL) 5 MG TABLET    Take 1 tablet (5 mg total) by mouth 3 times daily as needed for Muscle spasms.    ESOMEPRAZOLE MAGNESIUM (NEXIUM) 40 MG CAPSULE    Take 1 capsule (40 mg total) by mouth daily (before breakfast).    FENOFIBRATE (TRICOR) 145 MG TABLET    Take 1 tablet (145 mg total) by mouth daily.    FLUOXETINE (PROZAC) 40 MG CAPSULE    TAKE 1 CAPSULE BY MOUTH EVERY DAY    FLUTICASONE (FLONASE) 50 MCG/ACT NASAL SPRAY    Spray 1 spray into  nostril daily    GABAPENTIN 600 MG TABLET    Take 1 tablet (600 mg total) by mouth 3 times daily.    HYDROCODONE-ACETAMINOPHEN (NORCO) 5-325 MG PER TABLET    Take 1 tablet by mouth every 6 hours as needed for Pain. Max daily dose: 4 tablets    LEVOTHYROXINE (SYNTHROID, LEVOTHROID) 25 MCG TABLET    TAKE 1 TABLET BY MOUTH EVERY DAY BEFORE BREAKFAST    MUPIROCIN (BACTROBAN) 2 % OINTMENT    APPLY TOPICALLY THREE TIMES DAILY TO NASAL VESTIBULE    NON-SYSTEM MEDICATION    Medication/Supply: below knee compression stockings  Directions for Use: remove at night   Modified Medications    No medications on file   Discontinued Medications    BENZONATATE (TESSALON) 100 MG CAPSULE    Take 1 capsule (100 mg total) by mouth 3 times daily as needed for Cough.  Swallow whole. Do not crush or chew.    QUETIAPINE (SEROQUEL) 25 MG TABLET    TAKE 1 TABLET BY MOUTH NIGHTLY          Physical Exam:  Vitals:    12/26/23 1021   BP: 125/90   Pulse: 86   Temp: 35.8 C (96.4 F)   Weight: 89.1 kg (196 lb 6.4 oz)   Height: 1.778 m (5\' 10" )     SpO2 Readings from Last 3 Encounters:   12/26/23 98%   12/19/23 97%   11/30/23 94%      Estimated body mass index is 28.18 kg/m as calculated from the following:    Height as of this encounter: 1.778 m (5\' 10" ).    Weight as of this encounter: 89.1 kg (196 lb 6.4 oz).  BP Readings from Last 3 Encounters:   12/26/23 125/90   12/19/23 (!) 150/100   11/30/23 (!) 130/102     Wt Readings from Last 3 Encounters:   12/26/23 89.1 kg (196 lb 6.4 oz)   12/19/23 89.6 kg (197 lb 9.6 oz)   11/30/23 92.1 kg (203 lb)     Looks well.  Not in any acute distress  Neck-supple no adenopathy or thyromegaly  Cardiovascular-heart rate regular.  Heart sounds 1 and 2 with no added sounds no carotid bruits no edema  Respiratory-normal respiratory effort.  Good bilateral air entry.  Lungs are clear to palpation percussion and auscultation    Recent Lab Results:   Latest Reference Range & Units 12/25/23 12:30   Sodium 133 - 145  mmol/L 140   Potassium 3.3 - 4.6 mmol/L 4.6   Chloride 96 - 108 mmol/L 102   CO2 20 - 28 mmol/L 25   Anion Gap 7 - 16  13   UN 6 - 20 mg/dL 18   Creatinine 9.14 - 1.17 mg/dL 7.82 (H)   eGFR BY CREAT * 62   Glucose 60 - 99 mg/dL 956 (H)   Calcium 8.6 - 10.2 mg/dL 9.9   Total Protein 6.3 - 7.7 g/dL 7.6   Albumin 3.5 - 5.2 g/dL 5.0   ALT 0 - 50 U/L 50   AST 0 - 50 U/L 41   Alk Phos 40 - 130 U/L 51   Bilirubin,Total 0.0 - 1.2 mg/dL 1.0   Cholesterol mg/dL 213   Triglycerides mg/dL 086 !   HDL Cholesterol 40 - 60 mg/dL 41   LDL Calculated mg/dL 66   Non HDL Cholesterol mg/dL 98   Chol/HDL Ratio  3.4   TSH 0.27 - 4.20 uIU/mL 3.91   Free T4 0.9 - 1.7 ng/dL 1.3   (H): Data is abnormally high  !: Data is abnormal      Assessment and Plan:     1.  Hypertension.  Blood pressure at goal  2.  History of orthostasis.  Already knows the precautions he should take  3.  Hyperlipidemia good LDL at goal  4.  Elevated creatinine.  Recommend renal ultrasound to evaluate for kidney disease  5.  Elevated TSH  6.  Depression with insomnia.  Increase quetiapine to 50 mg    Plan follow-up in 3 months.  Continue low-fat low-salt diet  Orders this visit  Orders Placed This Encounter   Procedures    US renal retroperitoneal complete    Lipid Panel (Reflex to Direct  LDL if Triglycerides more than 400)    Comprehensive metabolic panel    TSH    T4, free          [1]   Allergies  Allergen Reactions    Shellfish-Derived Products Hives

## 2024-01-01 ENCOUNTER — Ambulatory Visit
Admission: RE | Admit: 2024-01-01 | Discharge: 2024-01-01 | Disposition: A | Discharge status: Home / Self Care | Source: Ambulatory Visit | Attending: Primary Care | Admitting: Primary Care

## 2024-01-01 DIAGNOSIS — R7989 Other specified abnormal findings of blood chemistry: Secondary | ICD-10-CM | POA: Insufficient documentation

## 2024-01-02 ENCOUNTER — Other Ambulatory Visit: Payer: Self-pay | Admitting: Cardiology

## 2024-01-02 ENCOUNTER — Other Ambulatory Visit: Payer: Self-pay | Admitting: Otolaryngology

## 2024-01-02 ENCOUNTER — Other Ambulatory Visit: Payer: Self-pay | Admitting: Primary Care

## 2024-01-02 MED ORDER — CARVEDILOL 6.25 MG PO TABS *I*
6.2500 mg | ORAL_TABLET | Freq: Two times a day (BID) | ORAL | 3 refills | Status: DC
Start: 2024-01-02 — End: 2024-06-04

## 2024-01-02 MED ORDER — MUPIROCIN 2 % EX OINT *I*
TOPICAL_OINTMENT | Freq: Every day | CUTANEOUS | 3 refills | Status: AC
Start: 2024-01-02 — End: ?

## 2024-01-03 MED ORDER — HYDROCODONE-ACETAMINOPHEN 5-325 MG PO TABS *I*
1.0000 | ORAL_TABLET | Freq: Four times a day (QID) | ORAL | 0 refills | Status: DC | PRN
Start: 2024-01-03 — End: 2024-02-20

## 2024-01-03 MED ORDER — LEVOTHYROXINE SODIUM 25 MCG PO TABS *I*
25.0000 ug | ORAL_TABLET | Freq: Every day | ORAL | 2 refills | Status: DC
Start: 2024-01-03 — End: 2024-05-21

## 2024-01-03 MED ORDER — FLUOXETINE HCL 40 MG PO CAPS *I*
40.0000 mg | ORAL_CAPSULE | Freq: Every day | ORAL | 2 refills | Status: DC
Start: 2024-01-03 — End: 2024-02-20

## 2024-01-03 MED ORDER — CYCLOBENZAPRINE HCL 5 MG PO TABS *I*
5.0000 mg | ORAL_TABLET | Freq: Three times a day (TID) | ORAL | 2 refills | Status: DC | PRN
Start: 2024-01-03 — End: 2024-02-20

## 2024-01-04 ENCOUNTER — Ambulatory Visit: Payer: Medicare Other | Attending: Cardiology

## 2024-01-04 ENCOUNTER — Other Ambulatory Visit: Payer: Self-pay

## 2024-01-04 VITALS — BP 130/100 | HR 93 | Resp 18 | Ht 70.0 in | Wt 195.0 lb

## 2024-01-04 DIAGNOSIS — I10 Essential (primary) hypertension: Secondary | ICD-10-CM

## 2024-01-04 DIAGNOSIS — E785 Hyperlipidemia, unspecified: Secondary | ICD-10-CM

## 2024-01-04 DIAGNOSIS — I38 Endocarditis, valve unspecified: Secondary | ICD-10-CM

## 2024-01-04 NOTE — Progress Notes (Signed)
 [facility]        Cardiology Office Revisit Note    Date of Visit: 01/04/2024 Patient: James Oneill   Patients PCP: Eustace Quail, MD Patient DOB: [birth year]  EMRN: [id number]     Subjective/Reason For Visit     I had the pleasure of seeing James Oneill in cardiology followup on 01/04/2024. 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     History of Present Illness    The patient is a 48 year old male who came in to discuss his blood pressure, syncope, fatigue, and medications.    He mentions that his blood pressure readings have been all over the place, with a recent high of 200/170 a few weeks ago. He has had some blackouts, dizzy spells, and headaches, which he thinks are due to his fluctuating blood pressure. He stopped keeping a log of his blood pressure because it was too frustrating. His symptoms seem random with no clear pattern. He recalls falling while getting up from the couch to go to the bathroom, without feeling dizzy beforehand. He stays well-hydrated, drinking water regularly because of a dry mouth from past radiation therapy for cancer. He also feels lightheaded, heavy, and gets headaches, especially when his blood pressure is high. During these times, he is sensitive to light and feels very agitated. He doesn't have any swelling in his legs and uses compression socks on and off. He feels dizzy a few times a week and falls about once a month. He has a cane but doesn't like using it because he's not used to it and often has his hands full. He also feels dizzy in the dark and when closing his eyes in the shower. He takes his medications regularly, with all but levothyroxine in the morning, which he takes first, and quetiapine at bedtime. He drinks one cup of coffee a day and doesn't use alcohol, tobacco, or drugs.  He avoids soda and milk because they upset his stomach. He is currently on carvedilol, which he takes in the morning and at dinner.    He recently had a kidney ultrasound because his creatinine levels have been rising over the last 7 to 8 blood tests. The ultrasound showed no issues, stones, or blockages.    He reports gaining weight and feeling possibly depressed. He has two types of chest pain, one related to his mitral valve and another that gets worse when he breathes. He also has palpitations and tachycardia. He continues to feel fatigued and has sleep apnea, often waking up at night due to cramps and pain, and doesn't feel rested in the morning. He usually goes to bed at 7:00 PM and wakes up at 7:00 AM but still feels tired.    He takes fenofibrate for cholesterol, which has lowered his triglycerides from over 1000 to around 200. He takes quetiapine at bedtime, which helps him sleep but causes bowel issues. He takes levothyroxine at night if he wakes up to use the bathroom, or first thing in the morning with water if he doesn't wake up during the night.        Past Medical History:   Diagnosis Date    Cancer     Throat    Depression     Hypertension     MVP (mitral valve prolapse)     Orthostatic hypotension  Past Surgical History:   Procedure Laterality Date    ELBOW FRACTURE SURGERY      GALLBLADDER SURGERY  2019    HERNIA REPAIR  2019    umbilical    NECK SURGERY Left 2018    radical dissection    TONSILLECTOMY  2017    SCC Left tonsil     Review of Systems   Constitutional:  Negative for malaise/fatigue.   Respiratory:  Negative for shortness of breath.    Cardiovascular:  Positive for chest pain and palpitations. Negative for orthopnea, claudication, leg swelling and PND.   Neurological:  Positive for dizziness, loss of consciousness and headaches.   All other systems reviewed and are negative.    Medications     Current Outpatient Medications   Medication Sig    HYDROcodone-acetaminophen (NORCO)  5-325 mg per tablet Take 1 tablet by mouth every 6 hours as needed for Pain. Max daily dose: 4 tablets    FLUoxetine (PROZAC) 40 mg capsule Take 1 capsule (40 mg total) by mouth daily.    levothyroxine (SYNTHROID, LEVOTHROID) 25 mcg tablet Take 1 tablet (25 mcg total) by mouth daily (before breakfast).    mupirocin (BACTROBAN) 2 % ointment Apply topically daily for Inflammation of Hair Follicles in the Nose. to the following areas: nose - only if area still infected    carvedilol (COREG) 6.25 mg tablet Take 1 tablet (6.25 mg total) by mouth 2 times daily (with meals) for High Blood Pressure.    QUEtiapine (SEROQUEL) 50 mg tablet Take 1 tablet (50 mg total) by mouth nightly.    atorvastatin (LIPITOR) 40 mg tablet Take 1 tablet (40 mg total) by mouth daily.    fenofibrate (TRICOR) 145 mg tablet Take 1 tablet (145 mg total) by mouth daily.    esomeprazole magnesium (NEXIUM) 40 mg capsule Take 1 capsule (40 mg total) by mouth daily (before breakfast).    gabapentin 600 mg tablet Take 1 tablet (600 mg total) by mouth 3 times daily.    fluticasone (FLONASE) 50 MCG/ACT nasal spray Spray 1 spray into nostril daily    cyclobenzaprine (FLEXERIL) 5 mg tablet Take 1 tablet (5 mg total) by mouth 3 times daily as needed for Muscle spasms.    Non-System Medication Medication/Supply: below knee compression stockings  Directions for Use: remove at night     Vitals and Physical Exam     Jayshon's  height is 1.778 m (5\' 10" ) and weight is 88.5 kg (195 lb). His blood pressure is 108/62 and his pulse is 93. His respiration is 18 and oxygen saturation is 98%.  Body mass index is 27.98 kg/m.    Physical Exam  Vitals and nursing note reviewed.   Constitutional:       General: He is not in acute distress.     Appearance: Normal appearance. He is not ill-appearing.   HENT:      Head: Normocephalic and atraumatic.   Pulmonary:      Effort: Pulmonary effort is normal. No respiratory distress.   Skin:     General: Skin is warm and dry.    Neurological:      Mental Status: He is alert and oriented to person, place, and time.   Psychiatric:         Mood and Affect: Mood normal.         Behavior: Behavior normal.       Laboratory Data     Hematology:   Results in Past 730 Days  Result Component Current Result Previous Result   WBC 4.8 (10/22/2023) 4.8 (10/07/2023)   Hemoglobin 13.9 (10/22/2023) 13.8 (10/07/2023)   Hematocrit 40 (10/22/2023) 39 (10/07/2023)   Platelets 284 (10/22/2023) 246 (10/07/2023)     Chemistry:   Results in Past 730 Days  Result Component Current Result Previous Result   Sodium 140 (12/25/2023) 138 (10/08/2023)   Potassium 4.6 (12/25/2023) 4.1 (10/08/2023)   Creatinine 1.39 (H) (12/25/2023) 1.38 (H) (10/22/2023)   Glucose 110 (H) (12/25/2023) 97 (10/08/2023)   Calcium 9.9 (12/25/2023) 9.7 (10/08/2023)   Magnesium 1.7 (10/08/2023) 1.8 (10/07/2023)   Hemoglobin A1C 5.6 (10/08/2023) Not in Time Range   AST 41 (12/25/2023) 34 (10/05/2023)   ALT 50 (12/25/2023) 43 (10/05/2023)   TSH 3.91 (12/25/2023) 3.05 (10/05/2023)     Coagulation Studies:   Results in Past 730 Days  Result Component Current Result Previous Result   INR 1.0 (10/05/2023) Not in Time Range     Cardiac:   Results in Past 730 Days  Result Component Current Result Previous Result   TROP T 0 HR High Sensitivity <6 (10/22/2023) <6 (10/05/2023)   TROP T 1 HR High Sensitivity <6 (10/22/2023) <6 (10/05/2023)   TROP T 3 HR High Sensitivity 7 (06/30/2022) Not in Time Range   NT-pro BNP <50 (10/05/2023) Not in Time Range     Lipids:   Results in Past 730 Days  Result Component Current Result Previous Result   Cholesterol 139 (12/25/2023) 182 (08/20/2023)   HDL 41 (12/25/2023) 40 (08/20/2023)   Triglycerides 195 (!) (12/25/2023) 429 (!) (08/20/2023)   LDL Calculated 66 (12/25/2023) see below (08/20/2023)   Chol/HDL Ratio 3.4 (12/25/2023) 4.6 (08/20/2023)     Cardiac/Imaging Data & Risk Scores              DOBUTAMINE STRESS ECHO COMPLETE 10/09/2023    Interpretation Summary  1.  Normal LV size, mass, and EF  without regional wall motion abnormalities  2.  Trace aortic valve regurgitation  3.  Borderline mild bi-leaflet mitral valve prolapse with trace mitral valve regurgitation  4.  Normal right heart size and function with indeterminate pulmonary artery systolic pressure  5.  Dobutamine-atropine protocol discontinued due to associated hypotensive BP response  6.  No ECG or echocardiographic evidence of ischemia at the low to moderate cardiac workload achieved.  The sensitivity of this test to detect obstructive coronary disease is reduced due to the submaximal cardiac workload achieved.               EPATCH 7-DAY MONITOR 11/06/2022    Narrative  ePatch 7 day monitoring:    Patient monitored for 6d 22h, analyzable time was 6d 21h starting on 10/23/2022  Primary rhythm was Sinus Rhythm. Average heart rate was 68 bpm (range 53 - 158 bpm).  SVE(s): Burden was 0.02 %, 142 total SVE(s)  SVT (AT, RT): 1 events, longest event 14 beats & fastest event 115 bpm  PVC(s): Burden was 0.01 %, 79 total PVC(s), 2 disparate morphologies             Impression and Plan     Patient Active Problem List   Diagnosis Code    Tonsillar cancer C09.9    History of cholecystectomy Z90.49    MVP (mitral valve prolapse) I34.1    Depression F32.A    Neuropathy G62.9    Chronic fatigue R53.82    Hypertension I10    ED (erectile dysfunction) N52.9    monitoring for cancer therapy related  cardiac dysfunction  Z91.89    Syncope and collapse R55    Dyslipidemia E78.5    Hypothyroid E03.9     1 - Blood pressure management - His blood pressure readings have been inconsistent, ranging from extremely low to excessively high values. Despite normal kidney function and lab results, the cause of these fluctuations remains unclear. His mitral valve appears healthy based on recent echocardiogram results. It is hypothesized that his general aches and pains may be contributing to some of his elevated blood pressure. We also discussed sleep apnea and he does admit to  some sleeping difficulty although that is more related to his aches and pains that will continually wake him up. A referral to a hypertension clinic was suggested for further evaluation and management of his condition. A 24-hour blood pressure monitoring cuff was discussed to possibly provide a more comprehensive understanding of his blood pressure fluctuations. He was advised to maintain a symptom journal to identify any potential triggers or patterns in his symptoms. He will continue his current regimen of carvedilol.    2 - Dyslipidemia - Continue with current medical therapy and no changes necessary at this time.        Lab results: 12/25/23  1230   LDL Calculated 66        Dyslipidemia Medications       HMG CoA Reductase Inhibitors       atorvastatin (LIPITOR) 40 mg tablet Take 1 tablet (40 mg total) by mouth daily.       Fibric Acid Derivatives       fenofibrate (TRICOR) 145 mg tablet Take 1 tablet (145 mg total) by mouth daily.           3 - Valvular Heart Disease - [ Mitral Valve Prolapse / Regurgitation ] - We will continue to monitor with surveillance echocardiograms and will plan to auscultate at next visit.    We will follow-up next with the patient for their routine 2 month appointment. Patient of Dr Robley Fries    Creation of this note may have been assisted through dictation using [employer] voice recognition software and/or [employer] which is an Engineer, building services. A reasonable effort was made to proofread this note but there may be minor transcription/typographical errors.      Nyoka Cowden, PA  Electronically signed on 01/04/2024 at 2:19 PM.

## 2024-01-11 ENCOUNTER — Emergency Department
Admission: EM | Admit: 2024-01-11 | Discharge: 2024-01-11 | Disposition: A | Discharge status: Home / Self Care | Source: Ambulatory Visit | Attending: Emergency Medicine | Admitting: Emergency Medicine

## 2024-01-11 ENCOUNTER — Other Ambulatory Visit: Payer: Self-pay

## 2024-01-11 ENCOUNTER — Emergency Department

## 2024-01-11 DIAGNOSIS — K529 Noninfective gastroenteritis and colitis, unspecified: Secondary | ICD-10-CM | POA: Insufficient documentation

## 2024-01-11 DIAGNOSIS — E860 Dehydration: Secondary | ICD-10-CM | POA: Insufficient documentation

## 2024-01-11 DIAGNOSIS — R55 Syncope and collapse: Secondary | ICD-10-CM | POA: Insufficient documentation

## 2024-01-11 DIAGNOSIS — G939 Disorder of brain, unspecified: Secondary | ICD-10-CM

## 2024-01-11 LAB — TROPONIN T 1 HR W/ DELTA HIGH SENSITIVITY
TROP T 0-1 HR DELTA High Sensitivity: -1 — ABNORMAL LOW (ref 0–2)
TROP T 1 HR High Sensitivity: 7 ng/L (ref 0–11)

## 2024-01-11 LAB — CBC AND DIFFERENTIAL
Baso # K/uL: 0 10*3/uL (ref 0.0–0.2)
Eos # K/uL: 0 10*3/uL (ref 0.0–0.5)
Hematocrit: 39 % (ref 37–52)
Hemoglobin: 13.9 g/dL (ref 12.0–17.0)
IMM Granulocytes #: 0 10*3/uL (ref 0.0–0.0)
IMM Granulocytes: 0.5 %
Lymph # K/uL: 0.6 10*3/uL — ABNORMAL LOW (ref 1.0–5.0)
MCV: 88 fL (ref 75–100)
Mono # K/uL: 0.8 10*3/uL (ref 0.1–1.0)
Neut # K/uL: 6 10*3/uL (ref 1.5–6.5)
Nucl RBC # K/uL: 0 10*3/uL (ref 0.0–0.0)
Nucl RBC %: 0 /100{WBCs} (ref 0.0–0.2)
Platelets: 192 10*3/uL (ref 150–450)
RBC: 4.5 MIL/uL (ref 4.0–6.0)
RDW: 11.9 % (ref 0.0–15.0)
Seg Neut %: 80.9 %
WBC: 7.4 10*3/uL (ref 3.5–11.0)

## 2024-01-11 LAB — BASIC METABOLIC PANEL
Anion Gap: 12 (ref 7–16)
CO2: 23 mmol/L (ref 20–28)
Calcium: 9.1 mg/dL (ref 8.6–10.2)
Chloride: 100 mmol/L (ref 96–108)
Creatinine: 1.26 mg/dL — ABNORMAL HIGH (ref 0.67–1.17)
Glucose: 130 mg/dL — ABNORMAL HIGH (ref 60–99)
Lab: 14 mg/dL (ref 6–20)
Potassium: 3.7 mmol/L (ref 3.3–4.6)
Sodium: 135 mmol/L (ref 133–145)
eGFR BY CREAT: 69 *

## 2024-01-11 LAB — COVID/INFLUENZA A & B/RSV NAAT (PCR)
COVID-19 NAAT (PCR): NEGATIVE
Influenza A NAAT (PCR): NEGATIVE
Influenza B NAAT (PCR): NEGATIVE
RSV NAAT (PCR): NEGATIVE

## 2024-01-11 LAB — PERFORMING LAB

## 2024-01-11 LAB — TROPONIN T 0 HR HIGH SENSITIVITY (IP/ED ONLY): TROP T 0 HR High Sensitivity: 8 ng/L (ref 0–11)

## 2024-01-11 MED ORDER — SODIUM CHLORIDE 0.9 % IV BOLUS *I*
1000.0000 mL | Freq: Once | Status: AC
Start: 2024-01-11 — End: 2024-01-11
  Administered 2024-01-11: 1000 mL via INTRAVENOUS

## 2024-01-11 NOTE — ED Triage Notes (Signed)
 Pt arrives to ER via [facility] with c/o syncopal episode today. States he had diarrhea yesterday and vomiting today.     Prehospital medications given: No

## 2024-01-11 NOTE — Discharge Instructions (Addendum)
 You need to follow up with neurology and cardiology  Drink plenty of fluids

## 2024-01-11 NOTE — ED Provider Notes (Signed)
 History     Chief Complaint   Patient presents with    Syncope     48 yo known hypertension, hypothyroid, depression  Now with nausea vomiting dehydration  Had syncope today  No fever no chills  No cp no sob      History provided by:  Patient  Language interpreter used: No          Medical/Surgical/Family History     Past Medical History:   Diagnosis Date    Cancer     Throat    Depression     Hypertension     MVP (mitral valve prolapse)     Orthostatic hypotension         Patient Active Problem List   Diagnosis Code    Tonsillar cancer C09.9    History of cholecystectomy Z90.49    MVP (mitral valve prolapse) I34.1    Depression F32.A    Neuropathy G62.9    Chronic fatigue R53.82    Hypertension I10    ED (erectile dysfunction) N52.9    monitoring for cancer therapy related  cardiac dysfunction Z91.89    Syncope and collapse R55    Dyslipidemia E78.5    Hypothyroid E03.9            Past Surgical History:   Procedure Laterality Date    ELBOW FRACTURE SURGERY      GALLBLADDER SURGERY  2019    HERNIA REPAIR  2019    umbilical    NECK SURGERY Left 2018    radical dissection    TONSILLECTOMY  2017    SCC Left tonsil          Social History[1]          Review of Systems   Constitutional:  Negative for chills and fever.   HENT:  Negative for congestion and sore throat.    Respiratory:  Negative for cough and shortness of breath.    Cardiovascular:  Negative for chest pain, palpitations and leg swelling.   Gastrointestinal:  Positive for diarrhea, nausea and vomiting.   Genitourinary: Negative.    Musculoskeletal: Negative.    Skin: Negative.    Neurological:  Positive for syncope. Negative for weakness and headaches.   Psychiatric/Behavioral:  Negative for agitation and behavioral problems.    All other systems reviewed and are negative.      Physical Exam     Triage Vitals  Triage Start: Start, (01/11/24 1533)  First Recorded BP: (!) 143/98, Resp: 18, Temp: 36 C (96.8 F), Temp src: TEMPORAL Oxygen Therapy SpO2: 100  %, O2 Device: None (Room air), Heart Rate: 93, (01/11/24 1536)  .  First Pain Reported  0-10 Scale: 4, Pain Location/Orientation: Neck, (01/11/24 1536)       Physical Exam  Vitals and nursing note reviewed.   Constitutional:       Appearance: Normal appearance.   HENT:      Head: Normocephalic and atraumatic.      Nose: Nose normal.      Mouth/Throat:      Mouth: Mucous membranes are dry.   Eyes:      Extraocular Movements: Extraocular movements intact.      Pupils: Pupils are equal, round, and reactive to light.   Cardiovascular:      Rate and Rhythm: Normal rate and regular rhythm.   Pulmonary:      Effort: Pulmonary effort is normal.      Breath sounds: Normal breath sounds.   Abdominal:  Palpations: Abdomen is soft.      Tenderness: There is no abdominal tenderness.   Musculoskeletal:         General: Normal range of motion.      Cervical back: Normal range of motion and neck supple.   Skin:     General: Skin is warm and dry.   Neurological:      General: No focal deficit present.      Mental Status: He is alert and oriented to person, place, and time.         Medical Decision Making   Patient seen by me on:  01/11/2024    Assessment:  48 yo known hypertension hypothyroid depression, now with nausea vomiting diarrhea   Presents with syncope  No chest pain no sob  No seizure like activity    Differential diagnosis:  Syncope  Dehydration  Gastroenteritis  ICB   Brain mass      Plan:  Orders Placed This Encounter      COVID/Influenza A & B/RSV NAAT (PCR)      CT head without contrast      *Chest STANDARD single view      CBC and differential      Basic metabolic panel      Troponin T 0 HR High Sensitivity      Troponin T 1 HR W/ Delta High Sensitivity      Performing lab      Initiate COVID precautions      Initiate droplet isolation      EKG 12 lead         EKG Interpretation:  Tracing reviewed by myself, normal sinus rhythm, no ischemic changes    ED Course and Disposition:  48 yo with syncope and  gastroenteritis , creat 1.26 little dehydration  Troponin neg, rest of labs normal normal EKG  Head ct neg  Pt feeling better after hydration  Will discharge home will follow up with pcp           Caro Hight, MD              [1]   Social History  Tobacco Use    Smoking status: Never    Smokeless tobacco: Never   Substance Use Topics    Alcohol use: Yes     Alcohol/week: 1.0 standard drink of alcohol     Types: 1 Glasses of wine per week    Drug use: Not Currently        Caro Hight, MD  01/23/24 [phone]

## 2024-01-23 ENCOUNTER — Encounter: Payer: Self-pay | Admitting: Emergency Medicine

## 2024-02-15 ENCOUNTER — Other Ambulatory Visit: Payer: Medicare Other

## 2024-02-16 NOTE — Telephone Encounter (Signed)
Sent MYCHART message to the patient about upcoming appointment. Also sent message to the nurse to have labs added to chart

## 2024-02-18 ENCOUNTER — Encounter: Payer: Self-pay | Admitting: Nephrology

## 2024-02-18 ENCOUNTER — Other Ambulatory Visit: Payer: Self-pay | Admitting: Nephrology

## 2024-02-18 DIAGNOSIS — N189 Chronic kidney disease, unspecified: Secondary | ICD-10-CM

## 2024-02-18 NOTE — Telephone Encounter (Signed)
 James Oneill has been scheduled for a new patient visit in nephrology clinic. New patient lab orders have been placed today: 02/18/2024.

## 2024-02-20 ENCOUNTER — Other Ambulatory Visit: Payer: Self-pay | Admitting: Primary Care

## 2024-02-20 MED ORDER — CYCLOBENZAPRINE HCL 5 MG PO TABS *I*
5.0000 mg | ORAL_TABLET | Freq: Three times a day (TID) | ORAL | 2 refills | Status: DC | PRN
Start: 2024-02-20 — End: 2024-04-01

## 2024-02-20 MED ORDER — FLUOXETINE HCL 40 MG PO CAPS *I*
40.0000 mg | ORAL_CAPSULE | Freq: Every day | ORAL | 2 refills | Status: DC
Start: 2024-02-20 — End: 2024-06-11

## 2024-02-20 MED ORDER — HYDROCODONE-ACETAMINOPHEN 5-325 MG PO TABS *I*
1.0000 | ORAL_TABLET | Freq: Four times a day (QID) | ORAL | 0 refills | Status: DC | PRN
Start: 2024-02-20 — End: 2024-04-01

## 2024-02-20 NOTE — Telephone Encounter (Signed)
 Last filled 3/25 for 7 days

## 2024-02-28 ENCOUNTER — Other Ambulatory Visit
Admission: RE | Admit: 2024-02-28 | Discharge: 2024-02-28 | Disposition: A | Discharge status: Home / Self Care | Source: Ambulatory Visit | Attending: Nephrology | Admitting: Nephrology

## 2024-02-28 DIAGNOSIS — N189 Chronic kidney disease, unspecified: Secondary | ICD-10-CM | POA: Insufficient documentation

## 2024-02-28 LAB — RENAL FUNCTION PANEL
Albumin: 4.7 g/dL (ref 3.5–5.2)
Anion Gap: 10 (ref 7–16)
CO2: 27 mmol/L (ref 20–28)
Calcium: 9.9 mg/dL (ref 8.6–10.2)
Chloride: 101 mmol/L (ref 96–108)
Creatinine: 1.24 mg/dL — ABNORMAL HIGH (ref 0.67–1.17)
Glucose: 99 mg/dL (ref 60–99)
Lab: 9 mg/dL (ref 6–20)
Phosphorus: 3 mg/dL (ref 2.7–4.5)
Potassium: 4.1 mmol/L (ref 3.3–4.6)
Sodium: 138 mmol/L (ref 133–145)
eGFR BY CREAT: 71 *

## 2024-02-28 LAB — URINALYSIS WITH MICROSCOPIC
Blood,UA: NEGATIVE
Glucose,UA: NEGATIVE
Ketones, UA: NEGATIVE
Leuk Esterase,UA: NEGATIVE
Nitrite,UA: NEGATIVE
Protein,UA: NEGATIVE
RBC,UA: NONE SEEN /HPF (ref 0–2)
Specific Gravity,UA: <=1.005 (ref 1.002–1.030)
WBC,UA: NONE SEEN /HPF (ref 0–5)
pH,UA: 6.5 (ref 5.0–8.0)

## 2024-02-28 LAB — CYSTATIN C WITH EGFR
Cystatin C: 0.8 mg/L (ref 0.6–1.0)
eGFR by Cystatin C: 109

## 2024-02-28 LAB — MICROALBUMIN, URINE, RANDOM
Creatinine,UR: 139 mg/dL (ref 20–300)
Microalbumin,UR: <1.2 mg/dL

## 2024-02-28 LAB — TIBC
Iron: 83 ug/dL (ref 45–170)
TIBC: 296 ug/dL (ref 250–450)
Transferrin Saturation: 28 % (ref 20–55)

## 2024-02-28 LAB — PROTEIN,UR + CREAT,UR WITH RATIO
Protein,UR: 10 mg/dL (ref 0–11)
TP Creatinine ratio,UR: 0.07

## 2024-02-28 LAB — VITAMIN D: 25-OH Vit Total: 58 ng/mL (ref 30–60)

## 2024-02-28 LAB — PTH, INTACT: Intact PTH: 34.4 pg/mL (ref 15.0–65.0)

## 2024-02-28 LAB — FERRITIN: Ferritin: 299 ng/mL — ABNORMAL HIGH (ref 20–250)

## 2024-02-28 LAB — HCT AND HGB
Hematocrit: 43 % (ref 37–52)
Hemoglobin: 15 g/dL (ref 12.0–17.0)

## 2024-02-28 LAB — TRANSFERRIN: Transferrin: 219 mg/dL (ref 200–360)

## 2024-03-07 ENCOUNTER — Other Ambulatory Visit: Payer: Self-pay

## 2024-03-07 ENCOUNTER — Ambulatory Visit
Admission: RE | Admit: 2024-03-07 | Discharge: 2024-03-07 | Disposition: A | Discharge status: Home / Self Care | Source: Ambulatory Visit

## 2024-03-07 ENCOUNTER — Ambulatory Visit: Attending: Cardiology

## 2024-03-07 VITALS — BP 114/96 | HR 104 | Ht 70.0 in | Wt 170.0 lb

## 2024-03-07 DIAGNOSIS — R55 Syncope and collapse: Secondary | ICD-10-CM

## 2024-03-07 DIAGNOSIS — I10 Essential (primary) hypertension: Secondary | ICD-10-CM

## 2024-03-07 DIAGNOSIS — E785 Hyperlipidemia, unspecified: Secondary | ICD-10-CM

## 2024-03-07 NOTE — Progress Notes (Signed)
 [facility]        Cardiology Office Revisit Note    Date of Visit: 03/07/2024 Patient: James Oneill   Patients PCP: Nelma Band, MD Patient DOB: 1976/07/04  EMRN: [id number]     Subjective/Reason For Visit     I had the pleasure of seeing James Oneill in cardiology followup on 03/07/2024. 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     History of Present Illness    He reports a recent hospital admission due to episodes of syncope, characterized by erratic blood pressure readings. He has discontinued home blood pressure monitoring due to the associated stress. He describes an episode of collapse, followed by confusion and vomiting, which led to a suspicion of stroke. However, subsequent tests, including the [place] Stroke Scale, did not confirm this diagnosis. He also experienced facial paralysis during this episode. Prior to the collapse, he experienced vertigo and difficulty focusing, and upon regaining consciousness, he was found cyanotic and confused. He occasionally experiences similar symptoms upon waking in the morning. He also reports dizziness upon standing, necessitating support for balance. His primary care physician has suggested a possible fight-or-flight response.    He has a history of mitral valve prolapse and experiences palpitations, which vary in severity. He also reports sharp chest pain upon inhalation and shortness of breath during minor activities such as showering or cleaning his dog. He has a 7-day heart monitor placed in January 2024.    He has been experiencing significant weight loss and muscle atrophy, which he attributes to prolonged bed rest. He has an upcoming appointment with primary care in early June 2025.    He has an upcoming appointment with nephrology on Mar 14, 2024, and neurology next  month. He has not had any appointments with anyone else since the hospital. He has an appointment with his oncologist on April 14, 2024. He has been diagnosed with radiation-induced brachial plexopathy and neuropathy, which is progressive. He also reports neck pain and cramps.        Past Medical History:   Diagnosis Date    Cancer     Throat    Depression     Hypertension     MVP (mitral valve prolapse)     Orthostatic hypotension      Past Surgical History:   Procedure Laterality Date    ELBOW FRACTURE SURGERY      GALLBLADDER SURGERY  2019    HERNIA REPAIR  2019    umbilical    NECK SURGERY Left 2018    radical dissection    TONSILLECTOMY  2017    SCC Left tonsil     Review of Systems   Constitutional:  Positive for malaise/fatigue and weight loss.   Respiratory:  Positive for shortness of breath.    Cardiovascular:  Positive for chest pain and palpitations. Negative for orthopnea, claudication, leg swelling and PND.   Neurological:  Positive for dizziness and loss of consciousness.   All other systems reviewed and are negative.    Medications     Current Outpatient Medications   Medication Sig    HYDROcodone -acetaminophen  (NORCO) 5-325 mg per tablet Take 1 tablet by mouth every 6 hours as needed for Pain. Max daily dose: 4 tablets    cyclobenzaprine  (FLEXERIL ) 5 mg tablet Take 1 tablet (5 mg total) by mouth 3 times daily  as needed for Muscle spasms.    FLUoxetine  (PROZAC ) 40 mg capsule Take 1 capsule (40 mg total) by mouth daily.    levothyroxine  (SYNTHROID , LEVOTHROID) 25 mcg tablet Take 1 tablet (25 mcg total) by mouth daily (before breakfast).    mupirocin  (BACTROBAN ) 2 % ointment Apply topically daily for Inflammation of Hair Follicles in the Nose. to the following areas: nose - only if area still infected    carvedilol  (COREG ) 6.25 mg tablet Take 1 tablet (6.25 mg total) by mouth 2 times daily (with meals) for High Blood Pressure.    QUEtiapine  (SEROQUEL ) 50 mg tablet Take 1 tablet (50 mg total) by mouth  nightly.    Non-System Medication Medication/Supply: below knee compression stockings  Directions for Use: remove at night    atorvastatin  (LIPITOR) 40 mg tablet Take 1 tablet (40 mg total) by mouth daily.    fenofibrate  (TRICOR ) 145 mg tablet Take 1 tablet (145 mg total) by mouth daily.    esomeprazole  magnesium  (NEXIUM ) 40 mg capsule Take 1 capsule (40 mg total) by mouth daily (before breakfast).    gabapentin  600 mg tablet Take 1 tablet (600 mg total) by mouth 3 times daily.    fluticasone  (FLONASE ) 50 MCG/ACT nasal spray Spray 1 spray into nostril daily     Vitals and Physical Exam     James Oneill's  height is 1.778 m (5\' 10" ) and weight is 77.1 kg (170 lb). His blood pressure is 114/96 (abnormal) and his pulse is 104. His oxygen saturation is 98%.  Body mass index is 24.39 kg/m.    Physical Exam  Vitals and nursing note reviewed.   Constitutional:       General: He is not in acute distress.     Appearance: Normal appearance. He is not ill-appearing.   HENT:      Head: Normocephalic and atraumatic.   Cardiovascular:      Rate and Rhythm: Normal rate and regular rhythm.      Pulses: Normal pulses.      Heart sounds: Normal heart sounds. No murmur heard.  Pulmonary:      Effort: Pulmonary effort is normal. No respiratory distress.      Breath sounds: Normal breath sounds. No wheezing, rhonchi or rales.   Skin:     General: Skin is warm and dry.   Neurological:      Mental Status: He is alert and oriented to person, place, and time.   Psychiatric:         Mood and Affect: Mood normal.         Behavior: Behavior normal.       Laboratory Data     Hematology:   Results in Past 730 Days  Result Component Current Result Previous Result   WBC 7.4 (01/11/2024) 4.8 (10/22/2023)   Hemoglobin 15.0 (02/28/2024) 13.9 (01/11/2024)   Hematocrit 43 (02/28/2024) 39 (01/11/2024)   Platelets 192 (01/11/2024) 284 (10/22/2023)     Chemistry:   Results in Past 730 Days  Result Component Current Result Previous Result   Sodium 138 (02/28/2024) 135  (01/11/2024)   Potassium 4.1 (02/28/2024) 3.7 (01/11/2024)   Creatinine 1.24 (H) (02/28/2024) 1.26 (H) (01/11/2024)   Glucose 99 (02/28/2024) 130 (H) (01/11/2024)   Calcium  9.9 (02/28/2024) 9.1 (01/11/2024)   Magnesium  1.7 (10/08/2023) 1.8 (10/07/2023)   Hemoglobin A1C 5.6 (10/08/2023) Not in Time Range   AST 41 (12/25/2023) 34 (10/05/2023)   ALT 50 (12/25/2023) 43 (10/05/2023)   TSH 3.91 (12/25/2023) 3.05 (10/05/2023)  Coagulation Studies:   Results in Past 730 Days  Result Component Current Result Previous Result   INR 1.0 (10/05/2023) Not in Time Range     Cardiac:   Results in Past 730 Days  Result Component Current Result Previous Result   TROP T 0 HR High Sensitivity 8 (01/11/2024) <6 (10/22/2023)   TROP T 1 HR High Sensitivity 7 (01/11/2024) <6 (10/22/2023)   TROP T 3 HR High Sensitivity 7 (06/30/2022) Not in Time Range   NT-pro BNP <50 (10/05/2023) Not in Time Range     Lipids:   Results in Past 730 Days  Result Component Current Result Previous Result   Cholesterol 139 (12/25/2023) 182 (08/20/2023)   HDL 41 (12/25/2023) 40 (08/20/2023)   Triglycerides 195 (!) (12/25/2023) 429 (!) (08/20/2023)   LDL Calculated 66 (12/25/2023) see below (08/20/2023)   Chol/HDL Ratio 3.4 (12/25/2023) 4.6 (08/20/2023)     Cardiac/Imaging Data & Risk Scores              DOBUTAMINE  STRESS ECHO COMPLETE 10/09/2023    Interpretation Summary  1.  Normal LV size, mass, and EF without regional wall motion abnormalities  2.  Trace aortic valve regurgitation  3.  Borderline mild bi-leaflet mitral valve prolapse with trace mitral valve regurgitation  4.  Normal right heart size and function with indeterminate pulmonary artery systolic pressure  5.  Dobutamine -atropine  protocol discontinued due to associated hypotensive BP response  6.  No ECG or echocardiographic evidence of ischemia at the low to moderate cardiac workload achieved.  The sensitivity of this test to detect obstructive coronary disease is reduced due to the submaximal cardiac workload  achieved.               EPATCH 7-DAY MONITOR 11/06/2022    Narrative  ePatch 7 day monitoring:    Patient monitored for 6d 22h, analyzable time was 6d 21h starting on 10/23/2022  Primary rhythm was Sinus Rhythm. Average heart rate was 68 bpm (range 53 - 158 bpm).  SVE(s): Burden was 0.02 %, 142 total SVE(s)  SVT (AT, RT): 1 events, longest event 14 beats & fastest event 115 bpm  PVC(s): Burden was 0.01 %, 79 total PVC(s), 2 disparate morphologies             Impression and Plan     Patient Active Problem List   Diagnosis Code    Tonsillar cancer C09.9    History of cholecystectomy Z90.49    MVP (mitral valve prolapse) I34.1    Depression F32.A    Neuropathy G62.9    Chronic fatigue R53.82    Hypertension I10    ED (erectile dysfunction) N52.9    monitoring for cancer therapy related  cardiac dysfunction Z91.89    Syncope and collapse R55    Dyslipidemia E78.5    Hypothyroid E03.9     1 - Hypertension - Blood pressure in the office today is (!) 114/96. Blood pressure readings have been inconsistent but improved, with some readings in the 150s and 155s, but not reaching previous highs of 200/170. As he has been having significant difficulties monitoring his own blood pressures due to anxiety we discussed having him complete an ambulatory blood pressure monitoring. Patient advised to pick up the cuff from [facility] when convenient. Patient is agreeable to this plan and will continue to monitor. If they have any new or changing symptoms as well as any questions or concerns they will reach out to the office for further guidance.  Antihypertensive Medications              carvedilol  (COREG ) 6.25 mg tablet (Taking) Take 1 tablet (6.25 mg total) by mouth 2 times daily (with meals) for High Blood Pressure.           2 -Syncope - Patient had a recent syncopal episode of unknown etiology. Apply a heart monitor today for one week to investigate the cause of syncope. If abnormalities are detected, further cardiac  evaluation will be considered. Differential diagnosis includes neurological causes, potentially related to radiation-induced conditions. Patient is agreeable to this plan and will continue to monitor. If they have any new or changing symptoms as well as any questions or concerns they will reach out to the office for further guidance.     3 - Dyslipidemia - Continue with current medical therapy and no changes necessary at this time.        Lab results: 12/25/23  1230   LDL Calculated 66        Dyslipidemia Medications       HMG CoA Reductase Inhibitors       atorvastatin  (LIPITOR) 40 mg tablet Take 1 tablet (40 mg total) by mouth daily.       Fibric Acid Derivatives       fenofibrate  (TRICOR ) 145 mg tablet Take 1 tablet (145 mg total) by mouth daily.           4 - Valvular Heart Disease - [ Mitral Valve Prolapse / Regurgitation ] - We will continue to monitor with surveillance echocardiograms and will plan to auscultate at next visit.    We will follow-up next with the patient for their routine 6 month appointment. Patient of Dr Cheron Corning    Creation of this note may have been assisted through dictation using [employer] voice recognition software and/or [employer] which is an Engineer, building services. A reasonable effort was made to proofread this note but there may be minor transcription/typographical errors.      Agustin Aldo, PA  Electronically signed on 03/07/2024 at 1:52 PM.

## 2024-03-11 ENCOUNTER — Telehealth: Payer: Self-pay

## 2024-03-11 NOTE — Telephone Encounter (Signed)
 Called patient to schedule ambulatory blood pressure monitor.  Patient stated he is unable to come to [place] two days in a row and asked that the order be cancelled.

## 2024-03-14 ENCOUNTER — Encounter: Payer: Self-pay | Admitting: Nephrology

## 2024-03-14 ENCOUNTER — Other Ambulatory Visit: Payer: Self-pay

## 2024-03-14 ENCOUNTER — Ambulatory Visit: Payer: Medicare Other | Attending: Nephrology | Admitting: Nephrology

## 2024-03-14 VITALS — BP 111/82 | HR 107 | Temp 97.0°F | Resp 17 | Ht 70.0 in | Wt 167.2 lb

## 2024-03-14 DIAGNOSIS — I959 Hypotension, unspecified: Secondary | ICD-10-CM | POA: Insufficient documentation

## 2024-03-15 ENCOUNTER — Other Ambulatory Visit
Admission: RE | Admit: 2024-03-15 | Discharge: 2024-03-15 | Disposition: A | Discharge status: Home / Self Care | Source: Ambulatory Visit | Attending: Primary Care | Admitting: Primary Care

## 2024-03-15 DIAGNOSIS — I959 Hypotension, unspecified: Secondary | ICD-10-CM | POA: Insufficient documentation

## 2024-03-15 DIAGNOSIS — R7989 Other specified abnormal findings of blood chemistry: Secondary | ICD-10-CM | POA: Insufficient documentation

## 2024-03-15 DIAGNOSIS — E785 Hyperlipidemia, unspecified: Secondary | ICD-10-CM | POA: Insufficient documentation

## 2024-03-15 LAB — COMPREHENSIVE METABOLIC PANEL
ALT: 22 U/L (ref 0–50)
AST: 22 U/L (ref 0–50)
Albumin: 4.8 g/dL (ref 3.5–5.2)
Alk Phos: 48 U/L (ref 40–130)
Anion Gap: 12 (ref 7–16)
Bilirubin,Total: 2.8 mg/dL — ABNORMAL HIGH (ref 0.0–1.2)
CO2: 28 mmol/L (ref 20–28)
Calcium: 9.9 mg/dL (ref 8.6–10.2)
Chloride: 101 mmol/L (ref 96–108)
Creatinine: 1.33 mg/dL — ABNORMAL HIGH (ref 0.67–1.17)
Glucose: 99 mg/dL (ref 60–99)
Lab: 14 mg/dL (ref 6–20)
Potassium: 3.9 mmol/L (ref 3.3–4.6)
Sodium: 141 mmol/L (ref 133–145)
Total Protein: 7 g/dL (ref 6.3–7.7)
eGFR BY CREAT: 65 *

## 2024-03-15 LAB — LIPID PANEL
Chol/HDL Ratio: 2.2
Cholesterol: 102 mg/dL
HDL: 47 mg/dL (ref 40–60)
LDL Calculated: 31 mg/dL
Non HDL Cholesterol: 55 mg/dL
Triglycerides: 137 mg/dL

## 2024-03-15 LAB — CORTISOL: CORTISOL,AM: 16.7 ug/dL (ref 6.0–18.4)

## 2024-03-15 LAB — MULTIPLE ORDERING DOCS

## 2024-03-15 LAB — T4, FREE: Free T4: 1.3 ng/dL (ref 0.9–1.7)

## 2024-03-15 LAB — TSH: TSH: 3.25 u[IU]/mL (ref 0.27–4.20)

## 2024-03-16 ENCOUNTER — Other Ambulatory Visit: Payer: Self-pay | Admitting: Nephrology

## 2024-03-16 ENCOUNTER — Telehealth: Payer: Self-pay | Admitting: Nephrology

## 2024-03-16 DIAGNOSIS — I959 Hypotension, unspecified: Secondary | ICD-10-CM

## 2024-03-16 DIAGNOSIS — E2749 Other adrenocortical insufficiency: Secondary | ICD-10-CM

## 2024-03-16 NOTE — Telephone Encounter (Signed)
 Spoke with James Oneill AM cortisol < 18, will need ACTH stim testing to confirm adrenal insufficiency    Cystatin C based eGFR was 103 which is quite high for his age, but average of the two he likely has normal kidney function    Referral to Endocrinology made    Patient was udnerstanding, concerned about elevated bilirubin, prior testing reavealing fatty liver, will defer to Dr. Clayborne Cunning for further management

## 2024-03-16 NOTE — Progress Notes (Signed)
 DEPARTMENT OF NEPHROLOGY  FOLLOW UP VISIT        Dear James Band, MD ,    Today, 03/16/2024, I had the pleasure of seeing James Oneill in follow up     Assessment:      Assessment & Plan  1. Renal insufficiency.  His renal function has been progressively declining since March 2024, coinciding with the onset of his blood pressure issues. In the absence of any anatomical abnormalities, his renal function appears satisfactory. However, given his fluctuating blood pressure, it is plausible that his kidneys are not receiving adequate blood flow, leading to suboptimal performance. This condition does not meet the criteria for chronic kidney disease but rather suggests an acute issue related to insufficient blood supply. His ferritin levels are elevated, indicating an inflammatory response. His creatinine based eGFR is currently at 71 percent and cystatin C is at 100 which is not alarming but warrants monitoring to prevent further decline. His bicarb levels also appear to be intermittently elevated, likely in response to inadequate renal perfusion. The primary concern is identifying the underlying cause of his hypotension, which could be neurological, cardiac, hormonal, or glandular in nature. It is crucial to address this issue promptly as it could potentially affect other organs beyond the kidneys. A comprehensive evaluation across all systems is necessary due to the complexity of his condition. Will rule out addisons/AI d/t hypotension and weight loss and he has history of hyperK. He has been advised to wear an abdominal binder during the day to help elevate his blood pressure upon standing.    2. Hypertension.  He has a history of hypertension with episodes of extremely high blood pressure (200/170) Dec 2024. His blood pressure medications have been adjusted multiple times, which may have contributed to his syncope and dizziness. He is currently on a blood pressure medication regimen that includes doses in the  morning and at dinner time. He will continue this regimen. A 24-hour ambulatory blood pressure monitor will be used to assess his blood pressure fluctuations throughout the day    3. Supraventricular tachycardia (SVT).  He has experienced episodes of SVT, sometimes triggered by emotional stress or physical activity. He is currently under the care of a cardiologist and has undergone stress testing and monitoring. He will continue to follow up with cardiology for further evaluation and management.    Follow-up  The patient will follow up in approximately one month via telemedicine.        History of Present Illness:  James Oneill is a 48 y.o. 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    Interval history since last visit:  History of Present Illness  The patient is a 48 year old male who presents to the kidney clinic for evaluation of renal insufficiency.    Renal Insufficiency  - Referred by Ivie Maroon, a urologist PA, following several emergency room visits  - Admitted to the hospital in 09/2023 due to syncope, during which he sustained a collapse and required stitches  - Informed of chronic kidney disease during this admission  - Condition had been under observation by his primary care physician but had not yet necessitated a referral  - Reports no family history of renal issues  - Maintains a high water  intake, occasionally consumes juice, and limits himself to one cup of coffee daily  Thyroid  Issues  - On thyroid  medication for less than a year, initiated due to voice changes post-radiation  - Underwent hormone testing, including testosterone  levels  - 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    Pain Management  - Previously on fentanyl  for pain management but discontinued its use due to inconsistent relief  - Currently  manages pain with gabapentin  and cyclobenzaprine     Tonsillar Cancer  - History of tonsillar cancer, treated with radiation in 2018  - Subsequent neck dissection involving the removal of 25 lymph nodes following a recurrence a year later  - Currently at the 5-year mark post-treatment  - Never smoked and tested positive for the HPV marker  - Experienced significant difficulty during the final two weeks of radiation treatment  - Speculation about a genetic predisposition to poor radiation tolerance  - Has not undergone genetic testing    Hypertension  - History of hypertension, with blood pressure readings as high as 200/170  - Blood pressure was very high in 2023  - Experienced syncope episodes, characterized by dizziness, blackouts, and extreme fatigue  - Symptoms have been present for several years but have persisted over the past year  - Currently under the care of a cardiologist  - Underwent stress testing and monitoring, which revealed PVCs and tachycardia  - Experiences labored breathing and exhaustion after minor physical exertion  - Diagnosed with supraventricular tachycardia (SVT), which may be triggered by emotional stress or PTSD    Radiation-Induced Brachial Plexopathy  - Diagnosed with radiation-induced brachial plexopathy  - Resulting in muscle loss and limited use of his left arm  - Experiences constant numbness and tingling in his left arm, which he supports with his right hand  - Condition initially presented on the left side but has since progressed to the right side  - Causes neck cramping and occasional paralysis  - Prescribed gabapentin  and pain medication for sleep  - Physical therapy has exacerbated his symptoms    Supplemental information: None    SOCIAL HISTORY  He does not smoke.    FAMILY HISTORY  His father died of throat cancer. His maternal grandmother had a couple of different types of cancer. No family history of kidney issues.    MEDICATIONS  Current: Gabapentin ,  cyclobenzaprine .  Past: Fentanyl .      Pertinent negatives:      General: Fatigue;  Weight Loss; Sweats; Anorexia;  Weakness; Fevers; Sweats;   Eyes:  Blurred Vision;  Diabetic retinopathy; Dry Eyes; Vision Loss;  ENT;  Dry Mouth; Metalic Taste in Mouth; hiccups; Nose Bleeds; Hearing Loss;   Heme:  Anemia; Swollen Glands; Easy Bruising  Lungs:  Cough; Sputum; Hemoptysis; Shortness of Breath; DOE  Cardiac: Chest Pain; Orthostasis; Claudication; syncope; Palpatations; Edema  GI:  Diarrhea; Constipation; Nausea; Vomiting; Dysgeusia  Musk:  Backpain; hot red tender joints, stiff joints; leg cramps; muscle cramps  Endo:  Polydipsia;  Polyuria; Temperature Intolerance  Skin:  Rashes; ulcers; Itching; Hair Loss; Lumps  Neuro:  Numbness, tingling, weakness; Headache; History of stroke/TIA    Renal:  NSAID use, PPI use, Contrast dye, antibiotics, supplements; hematuria,                          kidney stones, flank pain, urinary hesitancy, urinary tract infections;   nocturia, urinary frequency,  premature delivery, childhood illnesses    Patient Active Problem List   Diagnosis Code    Tonsillar cancer C09.9  History of cholecystectomy Z90.49    MVP (mitral valve prolapse) I34.1    Depression F32.A    Neuropathy G62.9    Chronic fatigue R53.82    Hypertension I10    ED (erectile dysfunction) N52.9    monitoring for cancer therapy related  cardiac dysfunction Z91.89    Syncope and collapse R55    Dyslipidemia E78.5    Hypothyroid E03.9         Past Medical History:   Diagnosis Date    Cancer     Throat    Depression     Hypertension     MVP (mitral valve prolapse)     Orthostatic hypotension           Allergies: Shellfish-derived products    Medications:   Current Outpatient Medications   Medication    FLUoxetine  (PROZAC ) 40 mg capsule    levothyroxine  (SYNTHROID , LEVOTHROID) 25 mcg tablet    mupirocin  (BACTROBAN ) 2 % ointment    carvedilol  (COREG ) 6.25 mg tablet    QUEtiapine  (SEROQUEL ) 50 mg tablet    atorvastatin   (LIPITOR) 40 mg tablet    esomeprazole  magnesium  (NEXIUM ) 40 mg capsule    gabapentin  600 mg tablet    fluticasone  (FLONASE ) 50 MCG/ACT nasal spray    HYDROcodone -acetaminophen  (NORCO) 5-325 mg per tablet    cyclobenzaprine  (FLEXERIL ) 5 mg tablet    Non-System Medication    fenofibrate  (TRICOR ) 145 mg tablet     No current facility-administered medications for this visit.         The Medications were reconciled in eRecord EMR today.    Social/Occupational:  KALIL WOESSNER  reports that he has never smoked. He has never used smokeless tobacco. He reports current alcohol use of about 1.0 standard drink of alcohol per week. He reports that he does not currently use drugs. He reports that he is not currently sexually active..    Family histories: There is no family history of kidney disease.    Review of Systems: The remainder of a comprehensive point review of systems was negative except as noted above.      Physical Examination:    CONSTITUTIONAL: Well developed, well nourished, in no apparent distress  BP 111/82 (BP Location: Left arm, Patient Position: Sitting, Cuff Size: adult)   Pulse 107   Temp 36.1 C (97 F) (Temporal)   Resp 17   Ht 1.778 m (5\' 10" )   Wt 75.8 kg (167 lb 3.2 oz)   SpO2 95%   BMI 23.99 kg/m       Physical Exam  General: Normal.  Vital signs: Within normal limits.  HEENT: Within normal limits.  Respiratory: Within normal limits.  Skin: Warm and dry, no rash.  Neurological: Normal.      Data:        Component Value Date/Time    NA 141 03/15/2024 0707    K 3.9 03/15/2024 0707    CL 101 03/15/2024 0707    CO2 28 03/15/2024 0707    UN 14 03/15/2024 0707    CREAT 1.33 (H) 03/15/2024 0707    CREAT 1.38 (H) 10/22/2023 1321    GFRC 72.06 08/23/2020 0933    GFRB 87.20 08/23/2020 0933    GLU 99 03/15/2024 0707    HA1C 5.6 10/08/2023 0434    CA 9.9 03/15/2024 0707    PO4 3.0 02/28/2024 1249    PTH 34.4 02/28/2024 1249    VID25 58 02/28/2024 1249    UTPR 10 02/28/2024 1253  UCRR 139 02/28/2024 1253     MALBR <1.20 02/28/2024 1253    MACR see below 02/28/2024 1253          Component Value Date/Time    WBC 7.4 01/11/2024 1601    HGB 15.0 02/28/2024 1249    HCT 43 02/28/2024 1249    PLT 192 01/11/2024 1601    FE 83 02/28/2024 1249    IBC 296 02/28/2024 1249    FESAT 28 02/28/2024 1249    FER 299 (H) 02/28/2024 1249        Results  Laboratory Studies  Creatinine levels have been increasing since March 2024. GFR is at 71%. Ferritin levels are high.    Imaging  CT of head with angiography shows no occlusion or dissection, but a cavernous anomaly is present. 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      Author: Ebelyn Bohnet Jansy Roslind Michaux, MD  03/16/2024

## 2024-03-17 ENCOUNTER — Telehealth: Payer: Self-pay

## 2024-03-17 NOTE — Telephone Encounter (Signed)
 suspecting adrenal insufficiency, hypotension, dizziness, hyperkalemia, AM cortisol < 18 need ACTH stim testing, prior hx of neck cancer with radiation     Please review and advise.

## 2024-03-18 ENCOUNTER — Other Ambulatory Visit: Payer: Self-pay | Admitting: Primary Care

## 2024-03-18 NOTE — Telephone Encounter (Signed)
 As a policy, the Division of Endocrinology makes a concerted effort to streamline our referral process to offer the best service for our patients and referring providers. One of our faculty has examined the medical record for this patient, and we feel that a referral to an endocrinology specialist is not necessary at this time.   Based on normal morning cortisol 16 (AM cortisol >10) and normal electrolytes patient does not appear to have adrenal insufficiency.

## 2024-03-19 ENCOUNTER — Telehealth: Payer: Self-pay | Admitting: Nephrology

## 2024-03-19 ENCOUNTER — Telehealth: Payer: Self-pay

## 2024-03-19 NOTE — Telephone Encounter (Signed)
 Copied from CRM #[id number]. Topic: Access to Care - Speak to Provider/Office Staff  >> Mar 19, 2024 12:50 PM Ardyth Kroner wrote:        James Oneill called and stated that he has reached out to Endocrinology regards to the referral that was sent and was advised Endocrinology would not be able to assist further regards to scheduling an appointment;     James Oneill would like to know what would be next steps regards to plan of care;    James Oneill can be reached at [phone];

## 2024-03-19 NOTE — Telephone Encounter (Unsigned)
 Copied from CRM #[id number]. Topic: Access to Care - Speak to Provider/Office Staff  >> Mar 19, 2024 12:39 PM Sharrell Deck wrote:  James Oneill is following up on his urgent referral to endocrinology placed by Dr. Sabescumar.  He can be reached at [phone].

## 2024-03-20 ENCOUNTER — Telehealth: Payer: Self-pay | Admitting: Nephrology

## 2024-03-20 NOTE — Telephone Encounter (Signed)
 Called patient and he said he doesn't want to go to [facility] for the Ambulatory BP Monitoring test that was ordered and he was going to call the office

## 2024-03-24 ENCOUNTER — Encounter: Payer: Self-pay | Admitting: Nephrology

## 2024-03-25 ENCOUNTER — Telehealth: Payer: Self-pay

## 2024-03-25 NOTE — Telephone Encounter (Signed)
 Writer called and spoke with James Oneill to schedule a 2 month follow up with Dr. Sabescumar. Scheduled on Friday, 05/23/2024 at 10:00 am.

## 2024-03-27 ENCOUNTER — Encounter: Payer: Self-pay | Admitting: Primary Care

## 2024-03-27 ENCOUNTER — Other Ambulatory Visit: Payer: Self-pay

## 2024-03-27 ENCOUNTER — Ambulatory Visit: Attending: Primary Care | Admitting: Primary Care

## 2024-03-27 VITALS — BP 122/76 | HR 75 | Temp 96.9°F | Ht 70.0 in | Wt 168.6 lb

## 2024-03-27 DIAGNOSIS — R7989 Other specified abnormal findings of blood chemistry: Secondary | ICD-10-CM | POA: Insufficient documentation

## 2024-03-27 DIAGNOSIS — E785 Hyperlipidemia, unspecified: Secondary | ICD-10-CM | POA: Insufficient documentation

## 2024-03-27 DIAGNOSIS — G540 Brachial plexus disorders: Secondary | ICD-10-CM | POA: Insufficient documentation

## 2024-03-27 DIAGNOSIS — R0989 Other specified symptoms and signs involving the circulatory and respiratory systems: Secondary | ICD-10-CM | POA: Insufficient documentation

## 2024-03-27 NOTE — Progress Notes (Signed)
 Patient ID: James Oneill is a 48 y.o. year old male.    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  Intermittent palpitations currently being worked up by cardiology and fatigue.  He is no longer fit for gainful employment.  He also continues to have chest pains.  He is not related to exercise.  This too is being worked up by cardiology.  Occasional dizziness.  No recent syncopal episodes.  Ruled out for adrenal insufficiency by his nephrologist.  Mood currently stable.  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  Apparently patient is under investigation for contaminated water .  He does not current the receive any benefits from the [place].  I encouraged him to establish care with them        The medication and allergy list was reviewed and is accurate  Allergy / Social History / Medications:  Allergies[1]  Social History     Tobacco Use    Smoking status: Never    Smokeless tobacco: Never   Substance Use Topics    Alcohol use: Yes     Alcohol/week: 1.0 standard drink of alcohol     Types: 1 Glasses of wine per week     Patient's Medications   New Prescriptions    No medications on file   Previous Medications    ATORVASTATIN  (LIPITOR) 40 MG TABLET    Take 1 tablet (40 mg total) by mouth daily.    CARVEDILOL  (COREG ) 6.25 MG TABLET    Take 1 tablet (6.25 mg total) by mouth 2 times daily (with meals) for High Blood Pressure.    CYCLOBENZAPRINE  (FLEXERIL ) 5 MG TABLET    Take 1 tablet (5 mg total) by mouth 3 times daily as needed for Muscle  spasms.    ESOMEPRAZOLE  MAGNESIUM  (NEXIUM ) 40 MG CAPSULE    TAKE 1 CAPSULE BY MOUTH DAILY BEFORE BREAKFAST    FENOFIBRATE  (TRICOR ) 145 MG TABLET    Take 1 tablet (145 mg total) by mouth daily.    FLUOXETINE  (PROZAC ) 40 MG CAPSULE    Take 1 capsule (40 mg total) by mouth daily.    FLUTICASONE  (FLONASE ) 50 MCG/ACT NASAL SPRAY    Spray 1 spray into nostril daily    GABAPENTIN  600 MG TABLET    Take 1 tablet (600 mg total) by mouth 3 times daily.    HYDROCODONE -ACETAMINOPHEN  (NORCO) 5-325 MG PER TABLET    Take 1 tablet by mouth every 6 hours as needed for Pain. Max daily dose: 4 tablets    LEVOTHYROXINE  (SYNTHROID , LEVOTHROID) 25 MCG TABLET    Take 1 tablet (25 mcg total) by mouth daily (before breakfast).    MUPIROCIN  (BACTROBAN ) 2 % OINTMENT    Apply topically daily for Inflammation of Hair Follicles in the Nose. to the following areas: nose - only if area still infected    NON-SYSTEM MEDICATION    Medication/Supply: below knee compression stockings  Directions for Use: remove at night  QUETIAPINE  (SEROQUEL ) 50 MG TABLET    TAKE 1 TABLET BY MOUTH NIGHTLY   Modified Medications    No medications on file   Discontinued Medications    No medications on file          Physical Exam:  Vitals:    03/27/24 1112   BP: 122/76   Pulse: 75   Temp: 36.1 C (96.9 F)   Weight: 76.5 kg (168 lb 9.6 oz)   Height: 1.778 m (5' 10)     SpO2 Readings from Last 3 Encounters:   03/27/24 99%   03/14/24 95%   03/07/24 98%      Estimated body mass index is 24.19 kg/m as calculated from the following:    Height as of this encounter: 1.778 m (5' 10).    Weight as of this encounter: 76.5 kg (168 lb 9.6 oz).  BP Readings from Last 3 Encounters:   03/27/24 122/76   03/14/24 111/82   03/07/24 (!) 114/96     Wt Readings from Last 3 Encounters:   03/27/24 76.5 kg (168 lb 9.6 oz)   03/14/24 75.8 kg (167 lb 3.2 oz)   03/07/24 77.1 kg (170 lb)     Looks well.  Not in any acute distress  Neck-induration left side no adenopathy or  thyromegaly  Cardiovascular-heart rate regular.  Heart sounds 1 and 2 with no added sounds no carotid bruits no edema  Respiratory-normal respiratory effort.  Good bilateral air entry.  Lungs are clear to palpation percussion and auscultation    Recent Lab Results:   Latest Reference Range & Units 03/15/24 07:07   Sodium 133 - 145 mmol/L 141   Potassium 3.3 - 4.6 mmol/L 3.9   Chloride 96 - 108 mmol/L 101   CO2 20 - 28 mmol/L 28   Anion Gap 7 - 16  12   UN 6 - 20 mg/dL 14   Creatinine 1.61 - 1.17 mg/dL 0.96 (H)   eGFR BY CREAT * 65   Glucose 60 - 99 mg/dL 99   Calcium  8.6 - 10.2 mg/dL 9.9   Total Protein 6.3 - 7.7 g/dL 7.0   Albumin 3.5 - 5.2 g/dL 4.8   ALT 0 - 50 U/L 22   AST 0 - 50 U/L 22   Alk Phos 40 - 130 U/L 48   Bilirubin,Total 0.0 - 1.2 mg/dL 2.8 (H)   Cholesterol mg/dL 045   Triglycerides mg/dL 409   HDL Cholesterol 40 - 60 mg/dL 47   LDL Calculated mg/dL 31   Non HDL Cholesterol mg/dL 55   Chol/HDL Ratio  2.2   TSH 0.27 - 4.20 uIU/mL 3.25   Free T4 0.9 - 1.7 ng/dL 1.3   CORTISOL,AM 6.0 - 18.4 ug/dL 81.1   (H): Data is abnormally high      Assessment and Plan:     1.  Labile blood pressure  2.  Controlled mixed hyperlipidemia  3.  Hypothyroidism  4.  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  Reactive depression    Continue current medications low-fat low-salt diet follow-up in 3 months with lab work  Orders this visit  Orders Placed This Encounter   Procedures    TSH    T4, free    Comprehensive metabolic panel    Lipid Panel (Reflex to Direct  LDL if Triglycerides more than 400)          [1]   Allergies  Allergen Reactions  Shellfish-Derived Products Hives

## 2024-04-01 ENCOUNTER — Other Ambulatory Visit: Payer: Self-pay | Admitting: Primary Care

## 2024-04-01 NOTE — Telephone Encounter (Signed)
 5/12 x 7 day

## 2024-04-02 MED ORDER — CYCLOBENZAPRINE HCL 5 MG PO TABS *I*
5.0000 mg | ORAL_TABLET | Freq: Three times a day (TID) | ORAL | 2 refills | Status: DC | PRN
Start: 2024-04-02 — End: 2024-05-16

## 2024-04-02 MED ORDER — HYDROCODONE-ACETAMINOPHEN 5-325 MG PO TABS *I*
1.0000 | ORAL_TABLET | Freq: Four times a day (QID) | ORAL | 0 refills | Status: DC | PRN
Start: 2024-04-02 — End: 2024-05-16

## 2024-04-04 NOTE — Progress Notes (Unsigned)
 Neurology Clinic: New Patient    CC: syncope    HPI:  James Oneill is a 48 y.o.  male with a history of labile HTN, throat cancer presenting to neurology clinic for evaluation of syncope.    Patient met with PCP in December when he reported several episodes of LOC upon standing, which were considered consistent with syncope secondary to orthostatic hypotension, in patient with labile hypertension. He was referred to neurology.     Today patient reports he as been presenting episodes of LOC about 2 years ago. The first this happened he was lying down, got up to answer the door and immediately passed out. He was out for seconds. He might feel somewhat confused for a few seconds, but within seconds he is able to reorient himself. He has had several of these episodes, almost always triggered by standing up, followed by light headedness and a sensation he is about to faint. He has never had incontinence, no abnormal movements. He is having these about 1 per month, but feels light headed upon standing daily.     About 2 months ago he one episode that was a bit unusual because it was associated with vomiting and he felt tired and hung over upon waking up.     He has checked his BP several times when he becomes dizzy on standing, and several times it has been as low as 60s/40s. However his BP will often raise to the 180s as well. He is following with Cardiology.      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 In 2017 he had throat cancer and was treated with radiation to the neck. In 2018 he had recurrence which was treated with radical neck dissection.     When he started radiation, prior to surgery, he was already experiencing significant neck pain, which almost made him quit RT. He also started noticing pain and fasciculations in his L shoulder, but also spreading towards his tongue and into his legs. After surgery, symptoms progressed, and he developed some  atrophy in his L shoulder. He was seen at [facility] in 2018 and there had been concern for possible motor neuron disease. He met with Dr Alfrieda in 2020, and main concern was for radiation induced plexopathy, as progression over the prior 2 years did not appear consistent with motor neuron disease. During EMG that visit he had only mildly reduced recruitment in the L deltoid, otherwise normal EMG/NCS.     Over the last 5 years he feels weakness continued to progress, though he still has functional use of his L UE. He has reduced range of motion with his L shoulder, but not the rest of his arm. Walks without difficulty or assistance.     He occasionally feels some tingling on the medial aspect of his LUE, but otherwise normal sensation    No known family history of neurological issues.     Used to drink wine daily, but not in recent years.       Past Medical History:  Past Medical History:   Diagnosis Date    Cancer     Throat    Depression     Hypertension     MVP (mitral valve prolapse)     Orthostatic hypotension        Medications:  Current Outpatient Medications on File Prior to Visit   Medication Sig Dispense Refill    HYDROcodone -acetaminophen  (NORCO) 5-325 mg per  tablet Take 1 tablet by mouth every 6 hours as needed for Pain. Max daily dose: 4 tablets 28 tablet 0    cyclobenzaprine  (FLEXERIL ) 5 mg tablet Take 1 tablet (5 mg total) by mouth 3 times daily as needed for Muscle spasms. 30 tablet 2    QUEtiapine  (SEROQUEL ) 50 mg tablet TAKE 1 TABLET BY MOUTH NIGHTLY 30 tablet 2    esomeprazole  magnesium  (NEXIUM ) 40 mg capsule TAKE 1 CAPSULE BY MOUTH DAILY BEFORE BREAKFAST 90 capsule 2    FLUoxetine  (PROZAC ) 40 mg capsule Take 1 capsule (40 mg total) by mouth daily. 30 capsule 2    levothyroxine  (SYNTHROID , LEVOTHROID) 25 mcg tablet Take 1 tablet (25 mcg total) by mouth daily (before breakfast). 30 tablet 2    mupirocin  (BACTROBAN ) 2 % ointment Apply topically daily for Inflammation of Hair Follicles in the Nose. to the  following areas: nose - only if area still infected 22 g 3    carvedilol  (COREG ) 6.25 mg tablet Take 1 tablet (6.25 mg total) by mouth 2 times daily (with meals) for High Blood Pressure. 180 tablet 3    Non-System Medication Medication/Supply: below knee compression stockings  Directions for Use: remove at night 1 each 0    atorvastatin  (LIPITOR) 40 mg tablet Take 1 tablet (40 mg total) by mouth daily. 90 tablet 3    gabapentin  600 mg tablet Take 1 tablet (600 mg total) by mouth 3 times daily. 270 tablet 2    fluticasone  (FLONASE ) 50 MCG/ACT nasal spray Spray 1 spray into nostril daily 16 g 5     No current facility-administered medications on file prior to visit.        Allergies:  Allergies[1]    Family History:  Family History   Problem Relation Age of Onset    Cancer Father     Dementia Maternal Grandmother     Heart surgery Maternal Grandfather        Social History:  Social History[2]        Physical Exam:  Vitals:    04/07/24 0943   BP: (!) 140/105   Pulse: 92   Resp: 18   Weight: 77.1 kg (170 lb)     General: No apparent distress. Normal habitus. Well groomed and dressed  Respiratory: Normal WOB    Neuro Exam (R/L):  MS: Awake and alert. Fluent. Naming, repetition & comprehension intact. No dysarthria. Oriented to person, place, and time.    CN:     II: Pupils 3/3 to 2/2, fields intact to confrontation,     III/IV/VI: Versions conjugate without nystagmus, no gaze preference.    V: Facial sensation symmetric to light touch    VII: Facial expression with reduced activation on the lower L hemiface, fasciculations on the L chin    VIII: Hearing intact to voice    IX/X: Palate elevates symmetrically    XII: Tongue deviates to the L, L hemi tongue atrophy with fasciculations    Motor: reduced bulk L shoulder and LUE  pronator drift is negative, no spontaneous movements, no fasciculations seen in the shoulders, UE or LE    Upper extremities R  L   Grip 5 4+   Finger abduction 5 4+   Finger extension 5 5   Wrist  extension 5 5   Elbow flexion 5 5   Elbow extension 5 5   Shoulder abduction 5 4   Shoulder flexion  5  4-    Lower extremities R  L  Hip flexion 5 5   Knee extension 5 5   Knee flexion 5 5   Ankle plantarflexion 5 5   Ankle dorsiflexion 5 5     Reflexes R  L   Biceps 2 1   Triceps 2 2   Brachioradialis 2 1   Patellar 2 2   Achilles 2 2     Plantar response: mute bilaterally    Sensation: intact and symmetric to light touch, temperature and pinprick in the upper and lower extremities. Proprioception sensation intact at the great toe bilaterally. Vibratory sense preserved at the great toes. Negative Romberg    Coordination: finger to nose intact, rapid alternating movements intact in UE. Heel to shin intact.     Gait: normal stride length, arm swing, normal base. Toe, heel and tandem walking intact      Labs:      Imaging:  MRI head 09/2023  No acute intracranial abnormality.      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      Hypoplastic versus atelectatic bilateral maxillary sinuses. Findings are nonspecific, but can be seen in the setting of silent sinus syndrome in the appropriate clinical context.       Assessment:  James Oneill is a 48 y.o.  male with a history of HTN, throat cancer presenting for evaluation of syncope and LUE symptoms.     Regarding episodes of LOC, they are consistent with established diagnosis of syncope, in patient with known labile hypertension complicated by postural hypotension. Following with Cardiology, to whom I would defer management. No further neurological work up at this time.     Regarding question of brachial plexopathy, though several aspects of his presentation suggest this, his symptoms spread beyond the anatomical distribution of the brachila plexus. He has symptoms involving the contralateral shoulder and LE. Though I could not see signs on exam of weakness in the LE or contra lateral UE, he clearly has facial asymmetry,  with chin fasciculation, hemi tongue atrophy and tongue fasciculations, which can not be accounted by brachial plexus lesion. 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 I will request MRI of the C spine. Time course over the last 7-8 years is not suggestive of motor neuron disease such as ALS. I will refer patient to neuromuscular clinic for consideration of repeat EMG and further management.      Plan:    #?Brachial plexopathy vs radiation induced myelopathy  -MRI C spine  -neuromuscular referral for consideration of repeat EMG and further management    #Syncope  -secondary to orthostatic hypotension, labile BP  -following with cardiology  -no neurological work up at this time      I personally spent 70 minutes on the calendar day of the encounter, including pre and post visit work.         This note was typed and/or dictated via Dragon Naturally Speaking at point of care. A reasonable attempt at proofreading has been made, however, transcription errors may occur    Eric Norman Shelia Phoebe, MD   Assistant Professor of Neurology         [1]   Allergies  Allergen Reactions    Shellfish-Derived Products Hives   [2]   Social History  Socioeconomic History    Marital status: Divorced   Tobacco Use    Smoking status: Never    Smokeless tobacco:  Never   Substance and Sexual Activity    Alcohol use: Yes     Alcohol/week: 1.0 standard drink of alcohol     Types: 1 Glasses of wine per week    Drug use: Not Currently    Sexual activity: Not Currently

## 2024-04-07 ENCOUNTER — Encounter: Payer: Self-pay | Admitting: Neurology

## 2024-04-07 ENCOUNTER — Other Ambulatory Visit: Payer: Self-pay

## 2024-04-07 ENCOUNTER — Ambulatory Visit: Payer: Medicare Other | Attending: Neurology | Admitting: Neurology

## 2024-04-07 VITALS — BP 140/105 | HR 92 | Resp 18 | Wt 170.0 lb

## 2024-04-07 DIAGNOSIS — G9589 Other specified diseases of spinal cord: Secondary | ICD-10-CM | POA: Insufficient documentation

## 2024-04-07 DIAGNOSIS — G540 Brachial plexus disorders: Secondary | ICD-10-CM | POA: Insufficient documentation

## 2024-04-07 DIAGNOSIS — R55 Syncope and collapse: Secondary | ICD-10-CM | POA: Insufficient documentation

## 2024-04-20 ENCOUNTER — Other Ambulatory Visit: Payer: Self-pay | Admitting: Primary Care

## 2024-05-02 ENCOUNTER — Ambulatory Visit
Admission: RE | Admit: 2024-05-02 | Discharge: 2024-05-02 | Disposition: A | Discharge status: Home / Self Care | Source: Ambulatory Visit | Attending: Neurology | Admitting: Neurology

## 2024-05-02 DIAGNOSIS — M5030 Other cervical disc degeneration, unspecified cervical region: Secondary | ICD-10-CM | POA: Insufficient documentation

## 2024-05-02 DIAGNOSIS — G9589 Other specified diseases of spinal cord: Secondary | ICD-10-CM

## 2024-05-02 DIAGNOSIS — M2578 Osteophyte, vertebrae: Secondary | ICD-10-CM | POA: Insufficient documentation

## 2024-05-02 DIAGNOSIS — M9931 Osseous stenosis of neural canal of cervical region: Secondary | ICD-10-CM | POA: Insufficient documentation

## 2024-05-02 DIAGNOSIS — M4712 Other spondylosis with myelopathy, cervical region: Secondary | ICD-10-CM | POA: Insufficient documentation

## 2024-05-02 MED ORDER — GADOPICLENOL 0.5 MMOL/ML (VUEWAY) IV SOLN *I*
0.1000 mL/kg | Freq: Once | INTRAVENOUS | Status: AC
Start: 2024-05-02 — End: 2024-05-02
  Administered 2024-05-02: 7.5 mL via INTRAVENOUS

## 2024-05-06 ENCOUNTER — Telehealth: Payer: Self-pay

## 2024-05-06 NOTE — Telephone Encounter (Signed)
 Patient called stating he received the results of his MRI and wanted to discuss them. Patient also scheduled a follow up visit for August 7th. Patient also stated he is waiting on his Neurosurgery referral that was sent out, he states that he has not heard anything yet.

## 2024-05-07 DIAGNOSIS — G540 Brachial plexus disorders: Secondary | ICD-10-CM

## 2024-05-07 NOTE — Telephone Encounter (Signed)
 Called patient and went over MRI results, which was unremarkable with the exception of mild spondylosis, not accounting to patient's presentation.  Noticeably no evidence of myelopathy or cord atrophy, to suggest ongoing or past radiation-induced myelopathy.    Etiology of presentation remains unclear.  Has a prior diagnosis of brachial plexopathy, but symptoms now exceed the brachial plexus, including contralateral and fascial involvement, with fasciculations in the chin, tongue atrophy tongue fasciculations.  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  Neuromuscular referral was placed a month ago, but patient has not yet been contacted.  Will attempt to facilitate patient being reconnected with neuromuscular division (prior patient of Dr. Alfrieda)

## 2024-05-09 ENCOUNTER — Emergency Department

## 2024-05-09 ENCOUNTER — Other Ambulatory Visit: Payer: Self-pay

## 2024-05-09 ENCOUNTER — Emergency Department
Admission: EM | Admit: 2024-05-09 | Discharge: 2024-05-10 | Disposition: A | Discharge status: Home / Self Care | Source: Ambulatory Visit | Attending: Student in an Organized Health Care Education/Training Program | Admitting: Student in an Organized Health Care Education/Training Program

## 2024-05-09 ENCOUNTER — Encounter: Payer: Self-pay | Admitting: Student in an Organized Health Care Education/Training Program

## 2024-05-09 DIAGNOSIS — R936 Abnormal findings on diagnostic imaging of limbs: Secondary | ICD-10-CM | POA: Insufficient documentation

## 2024-05-09 DIAGNOSIS — M79605 Pain in left leg: Secondary | ICD-10-CM

## 2024-05-09 DIAGNOSIS — M25552 Pain in left hip: Secondary | ICD-10-CM | POA: Insufficient documentation

## 2024-05-09 DIAGNOSIS — M79652 Pain in left thigh: Secondary | ICD-10-CM | POA: Insufficient documentation

## 2024-05-09 DIAGNOSIS — M25562 Pain in left knee: Secondary | ICD-10-CM | POA: Insufficient documentation

## 2024-05-09 DIAGNOSIS — M1712 Unilateral primary osteoarthritis, left knee: Secondary | ICD-10-CM

## 2024-05-09 DIAGNOSIS — M1612 Unilateral primary osteoarthritis, left hip: Secondary | ICD-10-CM

## 2024-05-09 MED ORDER — KETOROLAC TROMETHAMINE 30 MG/ML IJ SOLN *I*
15.0000 mg | Freq: Once | INTRAMUSCULAR | Status: AC
Start: 2024-05-10 — End: 2024-05-09
  Administered 2024-05-09: 15 mg via INTRAMUSCULAR
  Filled 2024-05-09: qty 1

## 2024-05-09 MED ORDER — ACETAMINOPHEN 500 MG PO TABS *I*
1000.0000 mg | ORAL_TABLET | Freq: Once | ORAL | Status: AC
Start: 2024-05-10 — End: 2024-05-09
  Administered 2024-05-09: 1000 mg via ORAL
  Filled 2024-05-09: qty 2

## 2024-05-09 NOTE — ED Provider Notes (Signed)
 History     Chief Complaint   Patient presents with    Hip Pain       History provided by:  Patient and medical records  Language interpreter used: No      James Oneill is a 48 y.o. male who presents with the above chief complaint.  Reports for the past year, he has been experiencing persistent left hip pain.  When asked to point to his area of pain, he points to his left lateral mid thigh.  States his pain somewhat shoots down to his knee and then upwards to his hip.  Denies any trauma or falls.  Did not see any physicians or medical providers for this issue.  Did not take any medications for discomfort.  Denies any numbness/tingling or weakness.  Denies any lower back pain.  Denies any issues with urination or defecation.  Denies fevers.  Reports that he has had been having difficulty with ambulation due to this pain.      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    History of cholecystectomy Z90.49    MVP (mitral valve prolapse) I34.1    Depression F32.A    Neuropathy G62.9    Chronic fatigue R53.82    Hypertension I10    ED (erectile dysfunction) N52.9    monitoring for cancer therapy related  cardiac dysfunction Z91.89    Syncope and collapse R55    Dyslipidemia E78.5    Hypothyroid E03.9            Past Surgical History[2]       Social History[3]          Review of Systems    Physical Exam     Triage Vitals  Triage Start: Start, (05/09/24 2304)  First Recorded BP: (!) 138/100, Resp: 18, Temp: 36 C (96.8 F) Oxygen Therapy SpO2: 99 %, O2 Device: None (Room air), Heart Rate: 80, (05/09/24 2306)  .  First Pain Reported 0-10  Pain Scale: 9, Pain Location/Orientation: Hip Left, (05/09/24 2306)       Physical Exam  Vitals and nursing note reviewed.   HENT:      Head: Normocephalic and atraumatic.      Nose: No congestion or rhinorrhea.   Eyes:      General:         Right eye: No discharge.         Left eye: No  discharge.      Conjunctiva/sclera: Conjunctivae normal.   Neck:      Comments: No midline cervical spinal tenderness.  No thoracic or lumbar midline tenderness  Cardiovascular:      Rate and Rhythm: Normal rate and regular rhythm.      Pulses: Normal pulses.   Pulmonary:      Effort: Pulmonary effort is normal. No respiratory distress.   Abdominal:      General: Abdomen is flat. There is no distension.   Musculoskeletal:         General: Tenderness present. Normal range of motion.      Cervical back: Normal range of motion and neck supple.      Comments: No trochanteric tenderness bilaterally.  Some left lateral mid thigh tenderness on palpation.  No skin changes.  Distally neurovascular intact, palpable DP TP pulses   Skin:     General: Skin is warm.  Capillary Refill: Capillary refill takes less than 2 seconds.   Neurological:      General: No focal deficit present.      Mental Status: He is alert.      Comments: Moving all 4 extremities w/o difficulty   Psychiatric:         Mood and Affect: Mood normal.         Behavior: Behavior normal.         Medical Decision Making   Patient seen by me on:  05/09/2024    Assessment:  James Oneill is a 48 y.o. male who presents with 1 year of left lateral thigh discomfort, no trauma or falls, no red flag symptoms, including ambulation due to pain, vital signs stable, physical exam as above, concerning for iliotibial band syndrome.  Could be femoral stress fracture or malignancy.  Reassuringly, he has a reassuring neurovascular exam.  Will obtain left femur x-ray, treat symptomatically and reassess.    Differential diagnosis:  Iliotibial band syndrome  Muscle strain  Inflammation  Calcification  Stress fracture  Bone spur  Bone malignancy  Doubt neurovascular injury    Plan:  Orders Placed This Encounter      Crutches      * Femur LEFT standard AP and Lateral      ED/UC REFERRAL TO PHYSICAL THERAPY      Please Provide Supply: Crutches      Apply ace wrap       acetaminophen  (TYLENOL ) tablet 1,000 mg      ketorolac  (TORADOL ) 30 mg/mL injection 15 mg      predniSONE  (DELTASONE ) tablet 20 mg      predniSONE  (DELTASONE ) 10 mg tablet      acetaminophen  (TYLENOL ) 325 mg tablet      ibuprofen  (ADVIL ,MOTRIN ) 600 mg tablet      lidocaine  (LIDODERM ) 5 % patch        ED Course and Disposition:  .      ED Course as of 05/10/24 0149   Fri May 09, 2024   2308 BP(!): 138/100   2308 Temp: 36 C (96.8 F)   2308 Heart Rate: 80   2308 Resp: 18   2308 SpO2: 99 %   2308 O2 Device: None (Room air)   2308 Weight: 77.1 kg (170 lb)   2328 * Femur LEFT standard AP and Lateral  No acute bony fracture though possible ossification near the upper shaft of the mid femur per my independent review, pending radiology read   Sat May 10, 2024   0014 Upon reevaluation the patient, reports his pain has somewhat improved.  Could be experiencing a component of iliotibial band syndrome though could be experiencing pain from this calcification on his x-ray.  Counseled on following up with his PCP for the final radiology read and further follow-up.  He is understanding agreeable.  Start patient on steroid taper and analgesia.  Will give crutches and placed in Ace wrap.  He is understanding and agreeable to go home.  Vital signs reassuring.  Return precautions given       [name], MD              [1]   Past Medical History:  Diagnosis Date    Cancer     Throat    Depression     Hypertension     MVP (mitral valve prolapse)     Orthostatic hypotension    [2]   Past Surgical History:  Procedure  Laterality Date    ELBOW FRACTURE SURGERY      GALLBLADDER SURGERY  2019    HERNIA REPAIR  2019    umbilical    NECK SURGERY Left 2018    radical dissection    TONSILLECTOMY  2017    SCC Left tonsil   [3]   Social History  Tobacco Use    Smoking status: Never    Smokeless tobacco: Never   Substance Use Topics    Alcohol use: Yes     Alcohol/week: 1.0 standard drink of alcohol     Types: 1 Glasses of wine per  week    Drug use: Not Currently        [name], MD  05/10/24 [phone]

## 2024-05-09 NOTE — ED Triage Notes (Signed)
 Patient c/o left hip pain for about 1 year but much worse over last few days and having difficulty walking, denies injury or overuse but has muscular disorder/ disease

## 2024-05-09 NOTE — ED Notes (Addendum)
 Patient c/o left sided pain that has kept him up tonight. 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 Patient states that the pain is usually concentrated in his left hip, but has been shooting down into his thigh tonight. Patient endorses being unable to put any weight on the left leg, states he has been hobbling to get around. Patient states that he has not done anything out of the ordinary tonight, he walks as a form of exercise. Patient states he has had cortisone injections in the past which have proved helpful for him. Patient provided with an additional ice pack for comfort, currently resting quietly in his room. Call light within reach, bed in lowest position, patient able to make needs known.

## 2024-05-10 MED ORDER — PREDNISONE 10 MG PO TABS *I*
ORAL_TABLET | ORAL | 0 refills | Status: AC
Start: 2024-05-10 — End: 2024-05-25

## 2024-05-10 MED ORDER — PREDNISONE 20 MG PO TABS *I*
20.0000 mg | ORAL_TABLET | Freq: Once | ORAL | Status: AC
Start: 2024-05-10 — End: 2024-05-10
  Administered 2024-05-10: 20 mg via ORAL
  Filled 2024-05-10: qty 1

## 2024-05-10 MED ORDER — LIDOCAINE 5 % EX PTCH *I*: 1.0000 | MEDICATED_PATCH | CUTANEOUS | 0 refills | Status: DC

## 2024-05-10 MED ORDER — IBUPROFEN 600 MG PO TABS *I*
600.0000 mg | ORAL_TABLET | Freq: Three times a day (TID) | ORAL | 0 refills | Status: DC | PRN
Start: 2024-05-10 — End: 2024-05-23

## 2024-05-10 MED ORDER — ACETAMINOPHEN 325 MG PO TABS *I*
650.0000 mg | ORAL_TABLET | Freq: Four times a day (QID) | ORAL | 0 refills | Status: AC | PRN
Start: 2024-05-10 — End: 2025-05-10

## 2024-05-10 NOTE — Discharge Instructions (Addendum)
 James Oneill, your ED visit today was for left lateral thigh pain.     From our interview, physical exam, and diagnostic testing, we suspect that your symptoms are likely associated with mild inflammation for musculoskeletal pain.      I, the EM physician, read your left femur x-ray is negative for acute bony fracture though please follow-up with your primary care doctor for the final radiology read.       For your complaint, we sent prescriptions for steroids to your pharmacy on file to help, please take as directed.    You may take acetaminophen  and/or ibuprofen  as needed to help with your pain symptoms.    Please continue taking your home medications as prescribed.    We recommend that you read and follow the attached information to help with your home management of this issue.    James Oneill, Please call and follow-up with your primary care doctor/regular provider and/or your specialist as soon as possible to ensure your symptoms are closely followed and under control.     If you start to develop any concerning symptoms such as trauma/falls or fevers, please return to the ED to be re-evaluated.

## 2024-05-12 ENCOUNTER — Emergency Department

## 2024-05-12 ENCOUNTER — Emergency Department
Admission: EM | Admit: 2024-05-12 | Discharge: 2024-05-12 | Disposition: A | Discharge status: Home / Self Care | Source: Ambulatory Visit | Attending: Emergency Medicine | Admitting: Emergency Medicine

## 2024-05-12 ENCOUNTER — Other Ambulatory Visit: Payer: Self-pay

## 2024-05-12 DIAGNOSIS — M19121 Post-traumatic osteoarthritis, right elbow: Secondary | ICD-10-CM

## 2024-05-12 DIAGNOSIS — S40211A Abrasion of right shoulder, initial encounter: Secondary | ICD-10-CM | POA: Insufficient documentation

## 2024-05-12 DIAGNOSIS — S52612A Displaced fracture of left ulna styloid process, initial encounter for closed fracture: Secondary | ICD-10-CM | POA: Insufficient documentation

## 2024-05-12 DIAGNOSIS — S298XXA Other specified injuries of thorax, initial encounter: Secondary | ICD-10-CM

## 2024-05-12 DIAGNOSIS — S3991XA Unspecified injury of abdomen, initial encounter: Secondary | ICD-10-CM

## 2024-05-12 DIAGNOSIS — M19032 Primary osteoarthritis, left wrist: Secondary | ICD-10-CM

## 2024-05-12 DIAGNOSIS — Y998 Other external cause status: Secondary | ICD-10-CM | POA: Insufficient documentation

## 2024-05-12 DIAGNOSIS — Y9389 Activity, other specified: Secondary | ICD-10-CM | POA: Insufficient documentation

## 2024-05-12 DIAGNOSIS — S3993XA Unspecified injury of pelvis, initial encounter: Secondary | ICD-10-CM

## 2024-05-12 DIAGNOSIS — M549 Dorsalgia, unspecified: Secondary | ICD-10-CM | POA: Insufficient documentation

## 2024-05-12 DIAGNOSIS — S59901A Unspecified injury of right elbow, initial encounter: Secondary | ICD-10-CM | POA: Insufficient documentation

## 2024-05-12 DIAGNOSIS — R0781 Pleurodynia: Secondary | ICD-10-CM | POA: Insufficient documentation

## 2024-05-12 DIAGNOSIS — Y9289 Other specified places as the place of occurrence of the external cause: Secondary | ICD-10-CM | POA: Insufficient documentation

## 2024-05-12 DIAGNOSIS — S0990XA Unspecified injury of head, initial encounter: Secondary | ICD-10-CM

## 2024-05-12 DIAGNOSIS — S199XXA Unspecified injury of neck, initial encounter: Secondary | ICD-10-CM

## 2024-05-12 DIAGNOSIS — M7989 Other specified soft tissue disorders: Secondary | ICD-10-CM

## 2024-05-12 HISTORY — DX: Essential (primary) hypertension: I10

## 2024-05-12 LAB — CBC AND DIFFERENTIAL
Baso # K/uL: 0 THOU/uL (ref 0.0–0.2)
Eos # K/uL: 0 THOU/uL (ref 0.0–0.5)
Hematocrit: 38 % (ref 37–52)
Hemoglobin: 13.3 g/dL (ref 12.0–17.0)
IMM Granulocytes #: 0.1 THOU/uL — ABNORMAL HIGH
IMM Granulocytes: 1.1 %
Lymph # K/uL: 0.4 THOU/uL — ABNORMAL LOW (ref 1.0–5.0)
MCV: 89 fL (ref 75–100)
Mono # K/uL: 0.3 THOU/uL (ref 0.1–1.0)
Neut # K/uL: 5.7 THOU/uL (ref 1.5–6.5)
Nucl RBC # K/uL: 0 THOU/uL
Nucl RBC %: 0 /100{WBCs} (ref 0.0–0.2)
Platelets: 200 THOU/uL (ref 150–450)
RBC: 4.3 MIL/uL (ref 4.0–6.0)
RDW: 12.3 % (ref 0.0–15.0)
Seg Neut %: 87.6 %
WBC: 6.5 THOU/uL (ref 3.5–11.0)

## 2024-05-12 LAB — BASIC METABOLIC PANEL
Anion Gap: 13 (ref 7–16)
CO2: 24 mmol/L (ref 20–28)
Calcium: 9.8 mg/dL (ref 8.6–10.2)
Chloride: 101 mmol/L (ref 96–108)
Creatinine: 1.04 mg/dL (ref 0.67–1.17)
Glucose: 121 mg/dL — ABNORMAL HIGH (ref 60–99)
Lab: 16 mg/dL (ref 6–20)
Sodium: 138 mmol/L (ref 133–145)
eGFR BY CREAT: 87

## 2024-05-12 MED ORDER — KETOROLAC TROMETHAMINE 15 MG/ML IJ SOLN *I*
15.0000 mg | Freq: Once | INTRAMUSCULAR | Status: AC
Start: 2024-05-12 — End: 2024-05-12
  Administered 2024-05-12: 15 mg via INTRAVENOUS
  Filled 2024-05-12: qty 1

## 2024-05-12 MED ORDER — OXYCODONE HCL 5 MG PO TABS *I*: 5.0000 mg | ORAL_TABLET | ORAL | 0 refills | Status: DC | PRN

## 2024-05-12 MED ORDER — MORPHINE SULFATE 2 MG/ML IV SOLN *WRAPPED*
2.0000 mg | Status: DC | PRN
Start: 2024-05-12 — End: 2024-05-12
  Administered 2024-05-12 (×2): 2 mg via INTRAVENOUS
  Filled 2024-05-12 (×2): qty 1

## 2024-05-12 MED ORDER — IOHEXOL 350 MG/ML (OMNIPAQUE) IV SOLN 500ML BOTTLE *I*
1.0000 mL | Freq: Once | INTRAVENOUS | Status: AC
Start: 2024-05-12 — End: 2024-05-12
  Administered 2024-05-12: 110 mL via INTRAVENOUS

## 2024-05-12 MED ORDER — SODIUM CHLORIDE 0.9 % IV BOLUS *I*
1000.0000 mL | Freq: Once | Status: AC
Start: 2024-05-12 — End: 2024-05-12
  Administered 2024-05-12: 1000 mL via INTRAVENOUS

## 2024-05-12 MED ORDER — LIDOCAINE 5 % EX PTCH *I*
2.0000 | MEDICATED_PATCH | Freq: Once | CUTANEOUS | Status: DC
Start: 2024-05-12 — End: 2024-05-12
  Administered 2024-05-12: 2 via TRANSDERMAL
  Filled 2024-05-12: qty 2

## 2024-05-12 MED ORDER — ONDANSETRON HCL 2 MG/ML IV SOLN *I*
4.0000 mg | Freq: Once | INTRAMUSCULAR | Status: AC
Start: 2024-05-12 — End: 2024-05-12
  Administered 2024-05-12: 4 mg via INTRAVENOUS
  Filled 2024-05-12: qty 2
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
## 2024-05-13 ENCOUNTER — Ambulatory Visit: Attending: Orthopedic Surgery | Admitting: Orthopedic Surgery

## 2024-05-13 ENCOUNTER — Ambulatory Visit: Attending: Primary Care | Admitting: Primary Care

## 2024-05-13 ENCOUNTER — Encounter: Payer: Self-pay | Admitting: Primary Care

## 2024-05-13 VITALS — HR 91 | Temp 97.0°F | Resp 17 | Wt 178.0 lb

## 2024-05-13 VITALS — BP 151/102 | HR 71 | Temp 96.7°F | Ht 70.0 in | Wt 178.0 lb

## 2024-05-13 DIAGNOSIS — M7062 Trochanteric bursitis, left hip: Secondary | ICD-10-CM | POA: Insufficient documentation

## 2024-05-13 DIAGNOSIS — S52612A Displaced fracture of left ulna styloid process, initial encounter for closed fracture: Secondary | ICD-10-CM

## 2024-05-13 DIAGNOSIS — S52613A Displaced fracture of unspecified ulna styloid process, initial encounter for closed fracture: Secondary | ICD-10-CM | POA: Insufficient documentation
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
## 2024-05-14 NOTE — ED Procedure Documentation (Signed)
 Procedures   Orthopedic injury treatment    Performed by: Bennett Fonda Agent, MD  Authorized by: Bennett Fonda Agent, MD  Consent: Verbal consent obtained. Written consent not obtained  Risks and benefits: risks, benefits and alternatives were discussed  Consent given by: patient  Patient understanding: patient states understanding of the procedure being performed  Test results: test results available and properly labeled  Site marked: the operative site was marked  Imaging studies: imaging studies available  Patient identity confirmed: verbally with patient and arm band  Time out: Immediately prior to procedure a time out was called to verify the correct patient, procedure, equipment, support staff and site/side marked as required.  Injury location: wrist  Location details: left wrist  Injury type: fracture  Pre-procedure neurovascular assessment: neurovascularly intact  Pre-procedure distal perfusion: normal  Pre-procedure neurological function: normal  Pre-procedure range of motion: normal  Local anesthesia used: no    Anesthesia:  Local anesthesia used: no  Manipulation performed: no  Immobilization: splint  Splint type: ulnar gutter  Supplies used: plaster  Splinting: splinting material cut to the appropriate size and molded to the patient, elastic bandage applied to secure the splint, splint was allowed to harden by air drying and splinting    Post Procedure Assessment Post-procedure neurovascular assessment: post-procedure neurovascularly intact  Post-procedure distal perfusion: normal  Post-procedure neurological function: normal  Post-procedure range of motion: normal  Patient tolerance: patient tolerated the procedure well with no immediate complications    Fracture Care: I provided definitive fracture care including splinting material cut to the appropriate size and molded to the patient, elastic bandage applied to secure the splint, splint was allowed to harden by air drying and  splinting  Fracture Care: I provided restorative fracture care including MANIPULATION and splinting  Referral:   Referral for follow-up made to: orthopedics        Fonda Agent Bennett, MD     Bennett Fonda Agent, MD  05/14/24 1114

## 2024-05-16 ENCOUNTER — Other Ambulatory Visit: Payer: Self-pay | Admitting: Primary Care

## 2024-05-16 ENCOUNTER — Telehealth: Payer: Self-pay | Admitting: Primary Care

## 2024-05-16 MED ORDER — HYDROCODONE-ACETAMINOPHEN 5-325 MG PO TABS *I*
1.0000 | ORAL_TABLET | Freq: Four times a day (QID) | ORAL | 0 refills | Status: DC | PRN
Start: 2024-05-16 — End: 2024-06-11

## 2024-05-16 MED ORDER — CYCLOBENZAPRINE HCL 5 MG PO TABS *I*
5.0000 mg | ORAL_TABLET | Freq: Three times a day (TID) | ORAL | 2 refills | Status: DC | PRN
Start: 2024-05-16 — End: 2024-06-28
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
## 2024-05-16 NOTE — Telephone Encounter (Signed)
 Last disp date 04/04/2024 28 tabs for 7 days
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
## 2024-05-19 ENCOUNTER — Telehealth: Payer: Self-pay | Admitting: Primary Care

## 2024-05-19 NOTE — Telephone Encounter (Signed)
 Patient scheduled and aware of Future Encounters    05/21/2024  3:15 PM Sch  FOLLOW UP VISIT  [name]  James Bottoms, MD
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
## 2024-05-21 ENCOUNTER — Encounter: Payer: Self-pay | Admitting: Primary Care

## 2024-05-21 ENCOUNTER — Other Ambulatory Visit: Payer: Self-pay | Admitting: Primary Care

## 2024-05-21 ENCOUNTER — Other Ambulatory Visit: Payer: Self-pay

## 2024-05-21 ENCOUNTER — Ambulatory Visit: Payer: Auto Insurance (includes no fault) | Attending: Primary Care | Admitting: Primary Care

## 2024-05-21 VITALS — BP 162/116 | HR 111 | Temp 97.1°F | Ht 70.0 in | Wt 174.6 lb

## 2024-05-21 DIAGNOSIS — M25521 Pain in right elbow: Secondary | ICD-10-CM

## 2024-05-21 DIAGNOSIS — K219 Gastro-esophageal reflux disease without esophagitis: Secondary | ICD-10-CM
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
## 2024-05-22 ENCOUNTER — Encounter: Payer: Self-pay | Admitting: Neurology

## 2024-05-22 ENCOUNTER — Ambulatory Visit
Admission: RE | Admit: 2024-05-22 | Discharge: 2024-05-22 | Disposition: A | Discharge status: Home / Self Care | Source: Ambulatory Visit

## 2024-05-22 ENCOUNTER — Ambulatory Visit: Attending: Medical | Admitting: Medical

## 2024-05-22 ENCOUNTER — Ambulatory Visit: Attending: Neurology | Admitting: Neurology

## 2024-05-22 VITALS — BP 166/100 | HR 73 | Temp 96.2°F | Resp 18 | Wt 174.0 lb

## 2024-05-22 VITALS — BP 126/89 | HR 95 | Temp 97.8°F | Resp 12 | Ht 70.0 in

## 2024-05-22 DIAGNOSIS — S52613A Displaced fracture of unspecified ulna styloid process, initial encounter for closed fracture: Secondary | ICD-10-CM

## 2024-05-22 DIAGNOSIS — M25521 Pain in right elbow: Secondary | ICD-10-CM

## 2024-05-22 DIAGNOSIS — G9589 Other specified diseases of spinal cord: Secondary | ICD-10-CM | POA: Insufficient documentation

## 2024-05-22 DIAGNOSIS — G540 Brachial plexus disorders: Secondary | ICD-10-CM | POA: Insufficient documentation

## 2024-05-22 DIAGNOSIS — M19021 Primary osteoarthritis, right elbow: Secondary | ICD-10-CM | POA: Insufficient documentation

## 2024-05-22 DIAGNOSIS — S5001XA Contusion of right elbow, initial encounter: Secondary | ICD-10-CM | POA: Insufficient documentation

## 2024-05-22 DIAGNOSIS — S52121A Displaced fracture of head of right radius, initial encounter for closed fracture: Secondary | ICD-10-CM | POA: Insufficient documentation

## 2024-05-22 DIAGNOSIS — Z9889 Other specified postprocedural states: Secondary | ICD-10-CM | POA: Insufficient documentation

## 2024-05-22 DIAGNOSIS — R55 Syncope and collapse: Secondary | ICD-10-CM | POA: Insufficient documentation

## 2024-05-22 MED ORDER — GENERIC DME *A*
0 refills | Status: DC
Start: 2024-05-22 — End: 2024-07-29

## 2024-05-22 MED ORDER — LEVOTHYROXINE SODIUM 25 MCG PO TABS *I*
25.0000 ug | ORAL_TABLET | Freq: Every day | ORAL | 2 refills | Status: DC
Start: 2024-05-22 — End: 2024-08-19
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
## 2024-05-22 NOTE — Patient Instructions (Signed)
 Our office does not get notified of insurance changes automatically. You are required to notify this office immediately if there have been any insurance changes that could effect your current treatment. You should also let the office know if you have a separate  or specialty drug Prescription coverage. Failure to do so could delay your care. Please let us  know if you have a prescription coverage besides your regular insurance.If you are experiencing any of the following; current illness, fever, start of antibiotics in the last 48 hours, pending surgery or any wounds, you are required to contact our office as soon as possible as this could potentially effect your scheduled infusion. We also encourage all patients to sign up for MyChart.  This allows for appointment reminders, communication and can allow for TeleHealth visits, if one is necessary. You may receive a survey regarding your visit today with us . Please take the time to fill it out as we appreciate the feedback.

## 2024-05-23 ENCOUNTER — Ambulatory Visit: Attending: Nephrology | Admitting: Nephrology

## 2024-05-23 ENCOUNTER — Other Ambulatory Visit: Payer: Self-pay

## 2024-05-23 VITALS — BP 132/96 | HR 81 | Temp 96.6°F | Resp 18 | Ht 70.0 in | Wt 174.1 lb

## 2024-05-23 DIAGNOSIS — E875 Hyperkalemia: Secondary | ICD-10-CM | POA: Insufficient documentation

## 2024-05-23 DIAGNOSIS — R0989 Other specified symptoms and signs involving the circulatory and respiratory systems: Secondary | ICD-10-CM | POA: Insufficient documentation

## 2024-05-23 DIAGNOSIS — I959 Hypotension, unspecified: Secondary | ICD-10-CM | POA: Insufficient documentation
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
## 2024-05-26 ENCOUNTER — Encounter: Payer: Self-pay | Admitting: Otolaryngology

## 2024-05-27 ENCOUNTER — Encounter: Payer: Self-pay | Admitting: Primary Care

## 2024-05-30 ENCOUNTER — Ambulatory Visit
Admission: RE | Admit: 2024-05-30 | Discharge: 2024-05-30 | Disposition: A | Discharge status: Home / Self Care | Payer: Self-pay | Source: Ambulatory Visit | Attending: Medical | Admitting: Medical

## 2024-05-30 DIAGNOSIS — M19121 Post-traumatic osteoarthritis, right elbow: Secondary | ICD-10-CM | POA: Insufficient documentation

## 2024-05-30 DIAGNOSIS — Z9889 Other specified postprocedural states: Secondary | ICD-10-CM

## 2024-05-30 DIAGNOSIS — M24021 Loose body in right elbow: Secondary | ICD-10-CM | POA: Insufficient documentation

## 2024-05-30 DIAGNOSIS — S5001XA Contusion of right elbow, initial encounter: Secondary | ICD-10-CM

## 2024-06-02 ENCOUNTER — Ambulatory Visit: Payer: Self-pay | Admitting: Medical

## 2024-06-02 NOTE — Telephone Encounter (Signed)
 CT Right elbow reviewed with patient, no acute fractures or hardware instability. He may come out of the sling and work on gentle range of motion. He will keep scheduled follow up for the 28th. He is in agreement with the plan.

## 2024-06-03 ENCOUNTER — Other Ambulatory Visit: Payer: Self-pay | Admitting: Student in an Organized Health Care Education/Training Program

## 2024-06-03 LAB — CBC AND DIFFERENTIAL
Baso # K/uL: 0 X10 3/uL (ref 0.0–0.2)
Basophil %: 1 % (ref 0–2)
Eos # K/uL: 0.1 X10 3/uL (ref 0.0–0.7)
Eosinophil %: 2 % (ref 0–8)
Hematocrit: 45.1 % (ref 39.0–52.0)
Hemoglobin: 15.3 g/dL (ref 14.0–18.0)
Immature Granulocytes Absolute: 0.01 X10 3/uL (ref 0.0–0.031)
Immature Granulocytes: 0.2 % (ref 0.0–0.4)
Lymph # K/uL: 1.6 X10 3/uL (ref 1.0–5.2)
Lymphocyte %: 27 % (ref 13–42)
MCH: 30.7 pg (ref 26.4–33.6)
MCHC: 33.9 g/dL (ref 31.9–37.3)
MCV: 91 fL (ref 80–93)
Mean Platelet Volume: 10.2 fL (ref 10.1–10.4)
Mono # K/uL: 0.7 X10 3/uL (ref 0.3–1.1)
Monocyte %: 12 % (ref 2–12)
Neut # K/uL: 3.6 X10 3/uL (ref 2.1–8.0)
Nucl RBC # K/uL: 0 x10 3/uL (ref 0.0–0.012)
Nucl RBC %: 0 % (ref 0.0–0.2)
Platelets: 289 10*3 (ref 144–366)
RBC Distribution Width-SD: 39.3 fL — ABNORMAL LOW (ref 41.0–42.7)
RBC: 4.98 10*6 (ref 4.60–6.20)
RDW: 11.9 % — ABNORMAL LOW (ref 12.8–14.2)
Seg Neut %: 59 % (ref 44–79)
WBC: 6.04 10*3 (ref 4.3–11.0)

## 2024-06-03 LAB — HEPATITIS B SURFACE ANTIBODY

## 2024-06-03 LAB — BASIC METABOLIC PANEL
Anion Gap: 9
CO2: 27 mmol/L (ref 22–29)
Calcium: 9.3 mg/dL (ref 8.6–10.4)
Chloride: 102 mmol/L (ref 98–107)
Glucose: 84 mg/dL (ref 70–100)
Lab: 12 mg/dL (ref 6–20)
Potassium: 3.7 mmol/L (ref 3.5–5.1)
Sodium: 138 mmol/L (ref 136–145)

## 2024-06-03 LAB — HEPATITIS B SURFACE ANTIGEN: HBV S Ag: NONREACTIVE

## 2024-06-03 LAB — HIV: HIV: NONREACTIVE

## 2024-06-03 LAB — HEPATITIS C VIRUS ANTIBODY WITH REFLEX TO HEPATITIS C QUANTITATIVE: Hep C Ab: NONREACTIVE

## 2024-06-03 LAB — APTT: aPTT: 33.5 s (ref 25.1–37.9)

## 2024-06-04 ENCOUNTER — Other Ambulatory Visit: Payer: Self-pay

## 2024-06-04 MED ORDER — CARVEDILOL 6.25 MG PO TABS *I*
6.2500 mg | ORAL_TABLET | Freq: Two times a day (BID) | ORAL | 3 refills | Status: AC
Start: 2024-06-04 — End: ?
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
## 2024-06-05 ENCOUNTER — Encounter: Payer: Self-pay | Admitting: Nephrology

## 2024-06-05 NOTE — Telephone Encounter (Signed)
 Please check on status of his endoscopy referral and also make sure that he has a ER follow-up with Dr. Leamon he was recently in the ER with severe headache

## 2024-06-11 ENCOUNTER — Other Ambulatory Visit: Payer: Self-pay | Admitting: Primary Care

## 2024-06-11 MED ORDER — HYDROCODONE-ACETAMINOPHEN 5-325 MG PO TABS *I*
1.0000 | ORAL_TABLET | Freq: Four times a day (QID) | ORAL | 0 refills | Status: DC | PRN
Start: 2024-06-11 — End: 2024-06-28

## 2024-06-11 MED ORDER — QUETIAPINE FUMARATE 50 MG PO TABS *I*: 50.0000 mg | ORAL_TABLET | Freq: Every evening | ORAL | 2 refills | Status: DC

## 2024-06-11 MED ORDER — ESOMEPRAZOLE MAGNESIUM 40 MG PO CPDR *I*: 40.0000 mg | DELAYED_RELEASE_CAPSULE | Freq: Every day | ORAL | 2 refills | Status: DC

## 2024-06-11 MED ORDER — FLUOXETINE HCL 40 MG PO CAPS *I*: 40.0000 mg | ORAL_CAPSULE | Freq: Every day | ORAL | 2 refills | Status: DC

## 2024-06-11 NOTE — Telephone Encounter (Signed)
 Last filled 8/1 for 7 days

## 2024-06-12 ENCOUNTER — Ambulatory Visit
Admission: RE | Admit: 2024-06-12 | Discharge: 2024-06-12 | Disposition: A | Discharge status: Home / Self Care | Source: Ambulatory Visit

## 2024-06-12 ENCOUNTER — Other Ambulatory Visit
Admission: RE | Admit: 2024-06-12 | Discharge: 2024-06-12 | Disposition: A | Discharge status: Home / Self Care | Source: Ambulatory Visit

## 2024-06-12 ENCOUNTER — Ambulatory Visit: Attending: Medical | Admitting: Medical

## 2024-06-12 VITALS — BP 118/70 | HR 85 | Temp 97.0°F | Resp 18

## 2024-06-12 DIAGNOSIS — M25532 Pain in left wrist: Secondary | ICD-10-CM | POA: Insufficient documentation

## 2024-06-12 DIAGNOSIS — S52612K Displaced fracture of left ulna styloid process, subsequent encounter for closed fracture with nonunion: Secondary | ICD-10-CM | POA: Insufficient documentation

## 2024-06-12 DIAGNOSIS — S52614D Nondisplaced fracture of right ulna styloid process, subsequent encounter for closed fracture with routine healing: Secondary | ICD-10-CM | POA: Insufficient documentation

## 2024-06-12 DIAGNOSIS — R7989 Other specified abnormal findings of blood chemistry: Secondary | ICD-10-CM | POA: Insufficient documentation

## 2024-06-12 DIAGNOSIS — E785 Hyperlipidemia, unspecified: Secondary | ICD-10-CM

## 2024-06-12 LAB — COMPREHENSIVE METABOLIC PANEL
ALT: 32 U/L (ref 0–50)
AST: 28 U/L (ref 0–50)
Albumin: 4.6 g/dL (ref 3.5–5.2)
Alk Phos: 63 U/L (ref 40–130)
Anion Gap: 11 (ref 7–16)
Bilirubin,Total: 1.5 mg/dL — ABNORMAL HIGH (ref 0.0–1.2)
CO2: 27 mmol/L (ref 20–28)
Calcium: 9.9 mg/dL (ref 8.6–10.2)
Chloride: 102 mmol/L (ref 96–108)
Creatinine: 1.18 mg/dL — ABNORMAL HIGH (ref 0.67–1.17)
Glucose: 94 mg/dL (ref 60–99)
Lab: 16 mg/dL (ref 6–20)
Potassium: 4.4 mmol/L (ref 3.3–4.6)
Sodium: 140 mmol/L (ref 133–145)
Total Protein: 6.7 g/dL (ref 6.3–7.7)
eGFR BY CREAT: 75

## 2024-06-12 LAB — T4, FREE: Free T4: 1.1 ng/dL (ref 0.9–1.7)

## 2024-06-12 LAB — LIPID PANEL
Chol/HDL Ratio: 2.8
Cholesterol: 122 mg/dL
HDL: 43 mg/dL (ref 40–60)
LDL Calculated: 47 mg/dL
Non HDL Cholesterol: 79 mg/dL
Triglycerides: 197 mg/dL — AB

## 2024-06-12 LAB — TSH: TSH: 4.47 u[IU]/mL — ABNORMAL HIGH (ref 0.27–4.20)

## 2024-06-12 MED ORDER — GENERIC DME *A*
0 refills | Status: AC
Start: 2024-06-12 — End: ?

## 2024-06-12 NOTE — Progress Notes (Signed)
 Patient: James CentoreDOB: [birth year]HISTORY:History of Present IllnessThe patient is a 48 year old male who comes in today for a follow-up visit of his ulna styloid fracture, approximately 4 weeks post-injury.He reports an improvement in his condition, although he still experiences some stiffness and discomfort. He also notes that his elbow feels better than before. He has been using OTC medications as needed for pain control. Past medical, surgical, social, and family histories were updated and reviewed. Medications and drug allergies also updated and reviewed.MND:Qnlmuzzw system review is updated and reviewed. There are no significant changes.PHYSICAL EXAM:BP 118/70   Pulse 85   Temp 36.1 C (97 F)   Resp 18   SpO2 100% On examination, the patient is well-developed well-nourished in no apparent distress. Alert and oriented.  Answers questions appropriately.SPECIFIC LEFT UPPER EXTREMITY EXAMSkin intactWarm and well perfusedLess than 2 second capillary refill.Baseline motor and sensory exam intact.SILT is normal to ulnar, median, and radial nerve.Able to make complete composite fist Wrist Flexion 50; Wrist Extension 50 nontender at the ulnar styloid of the wrist.  Mild swelling.  No bruising.DIAGNOSTIC TESTS: X-Rays of the Left wrist  including AP, Lateral, and Oblique views were taken by outside source. Per my interpretation, they reveal stable alignment with interval callous formation over the ulna styloid. No acute fractures, dislocations, or bony osseous lesions.ASSESSMENT:1. Left wrist pain- Wrist LEFT standard PA, Lateral, and both Oblique views; Future2. Closed nondisplaced fracture of styloid process of right ulna with routine healing, subsequent encounter- generic DME; Item to dispense: cock up wrist brace  Instructions for use: daily  Dispense: 1 each; Refill: 0- AMB REFERRAL TO PHYSICAL / OCCUPATIONAL THERAPY  PLAN:Assessment & Plan1. Ulna styloid fracture:The x-ray results indicate satisfactory progress in the healing process. Swelling may persist for up to 6 months, which is a normal part of recovery. Bruising is also expected to gradually resolve over time. The treatment plan includes transitioning from a cast to a cock-up wrist brace for 2 weeks. This brace should be worn consistently, similar to the cast, but can be removed for hand washing and showering. After this period, the use of the brace will be gradually reduced, and range of motion exercises will begin, with the brace only being used when lifting heavy objects. Occupational therapy will be initiated to aid in regaining strength. A brace and therapy sessions will be arranged before departure today.Follow up in 6 weeks, xrays left wrist. Advised patient to call our office with any questions or concerns in the interim.James Oneill, PAThank you for this consultation. Please do not hesitate to contact with any questions regarding today's evaluation. This note was dictated using speech recognition software. A thorough attempt was made to proof read and correct any errors.

## 2024-06-13 ENCOUNTER — Ambulatory Visit: Payer: Self-pay | Attending: Surgery | Admitting: Surgery

## 2024-06-13 ENCOUNTER — Other Ambulatory Visit: Payer: Self-pay

## 2024-06-13 VITALS — BP 124/87 | HR 90 | Temp 97.7°F | Resp 16 | Ht 70.0 in | Wt 177.0 lb

## 2024-06-13 DIAGNOSIS — K5900 Constipation, unspecified: Secondary | ICD-10-CM | POA: Insufficient documentation

## 2024-06-13 DIAGNOSIS — R1310 Dysphagia, unspecified: Secondary | ICD-10-CM | POA: Insufficient documentation

## 2024-06-13 MED ORDER — PEG 3350-KCL-NABCB-NACL-NASULF 236 GM PO SOLR *I*
4.0000 L | Freq: Once | ORAL | 0 refills | Status: AC
Start: 2024-06-13 — End: 2024-06-13

## 2024-06-13 NOTE — H&P (Signed)
 History and PhysicalHISTORY:Chief Complaint Patient presents with  New Patient Visit History of Present Illness:  James Oneill is 48 y.o. male who presented with multiple concerns. 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 States that he has had worsening dysphagia over the past few years. He has seen speech pathology at [facility] and had a pharyngogram in January 2025 and per note it was recommended that he eat a regular diet with thin liquids and alternate solids with liquids. James Oneill states that if he doesn't eat something like Jell-o after each meal that he can feel the food bolus stuck for several hours and sometimes forcefully regurgitates the food in order to make himself feel better. He states that he was told at the last swallow eval it is not to the point where they need to do the balloon but his symptoms have gotten worse since then. He also has a chronic cough now and has been drooling on himself. Notes that last EGD and colonoscopy were around 2021 and he thinks were normal, other than polyps. Additionally James Oneill states that over the past several months he has been constipated. He will go 2 days without having a bowel movement, then will take copious amount of dulcolax and have a large BM followed by soft stools. He states that he feels very bloated and uncomfortable between bowel movements. This is new for him. His weight has been stable but often fluctuates.Problems:Patient Active Problem List Diagnosis Code  Tonsillar cancer C09.9  History of cholecystectomy Z90.49  MVP (mitral valve prolapse) I34.1  Depression F32.A  Neuropathy G62.9  Chronic fatigue R53.82  Hypertension I10  ED (erectile dysfunction) N52.9  monitoring for cancer therapy related  cardiac dysfunction Z91.89  Syncope and collapse R55  Dyslipidemia  E78.5  Hypothyroid E03.9  Dysphagia, unspecified type R13.10  Constipation, unspecified constipation type K59.00  Past Medical/Surgical History: Past Medical History[1]Past Surgical History[2]Allergies:  Allergies[3]Current medications:  Current Outpatient Medications Medication Sig  generic DME Item to dispense: cock up wrist brace  Instructions for use: daily  FLUoxetine  (PROZAC ) 40 mg capsule Take 1 capsule (40 mg total) by mouth daily.  QUEtiapine  (SEROQUEL ) 50 mg tablet Take 1 tablet (50 mg total) by mouth nightly.  esomeprazole  magnesium  (NEXIUM ) 40 mg capsule Take 1 capsule (40 mg total) by mouth daily (before breakfast).  HYDROcodone -acetaminophen  (NORCO) 5-325 mg per tablet Take 1 tablet by mouth every 6 hours as needed for Pain. Max daily dose: 4 tablets  carvedilol  (COREG ) 6.25 mg tablet Take 1 tablet (6.25 mg total) by mouth 2 times daily (with meals) for High Blood Pressure.  levothyroxine  (SYNTHROID , LEVOTHROID) 25 mcg tablet Take 1 tablet (25 mcg total) by mouth daily (before breakfast).  generic DME Item to dispense: sling  Instructions for use: daily  cyclobenzaprine  (FLEXERIL ) 5 mg tablet Take 1 tablet (5 mg total) by mouth 3 times daily as needed for Muscle spasms.  oxyCODONE  (ROXICODONE ) 5 mg immediate release tablet Take 1 tablet (5 mg total) by mouth every 4-6 hours as needed for Pain. Max daily dose: 30 mg  lidocaine  (LIDODERM ) 5 % patch Apply 1 patch onto the skin every 24 hours over 12 hours. to the following areas: left thigh pain area. Remove and discard patch within 12 hours or as directed.  GABAPENTIN  600 MG tablet TAKE 1 TABLET BY MOUTH THREE TIMES DAILY  mupirocin  (BACTROBAN ) 2 % ointment Apply  topically daily for Inflammation of Hair Follicles in the Nose. to the following areas: nose - only if area still infected  Non-System Medication Medication/Supply: below knee compression stockingsDirections for Use: remove at night   atorvastatin  (LIPITOR) 40 mg tablet Take 1 tablet (40 mg total) by mouth daily.  fluticasone  (FLONASE ) 50 MCG/ACT nasal spray Spray 1 spray into nostril daily  polyethylene glycol (GOLYTELY ) 236 GM solution Take 4,000 mLs by mouth once for 1 dose.  acetaminophen  (TYLENOL ) 325 mg tablet Take 2 tablets (650 mg total) by mouth every 6 hours as needed. Family History:  Family History[4]Social/Occupational History: Social History Socioeconomic History  Marital status: Single Tobacco Use  Smoking status: Never  Smokeless tobacco: Never Substance and Sexual Activity  Alcohol use: Yes   Alcohol/week: 1.0 standard drink of alcohol   Types: 1 Glasses of wine per week  Drug use: Not Currently  Sexual activity: Not Currently Review of Systems:  Review of Systems Constitutional:  Negative for chills, fever and weight loss. Respiratory:  Negative for shortness of breath.  Cardiovascular:  Negative for chest pain. Gastrointestinal:  Positive for abdominal pain and constipation. Negative for blood in stool, diarrhea, heartburn, melena, nausea and vomiting.      + dysphagia All other systems reviewed and are negative.Vital Signs: BP 124/87 (BP Location: Left arm, Patient Position: Sitting, Cuff Size: adult)   Pulse 90   Temp 36.5 C (97.7 F) (Temporal)   Resp 16   Ht 1.778 m (5' 10)   Wt 80.3 kg (177 lb)   SpO2 98%   BMI 25.40 kg/m PHYSICAL EXAM:Physical ExamVitals reviewed. Constitutional:     General: He is not in acute distress.   Appearance: Normal appearance. He is well-developed and normal weight. He is not ill-appearing. HENT:    Head: Normocephalic and atraumatic. Eyes:    Extraocular Movements: Extraocular movements intact.    Conjunctiva/sclera: Conjunctivae normal. Cardiovascular:    Rate and Rhythm: Normal rate and regular rhythm.    Pulses: Normal pulses.    Heart sounds: Normal heart sounds. No murmur  heard.Pulmonary:    Effort: Pulmonary effort is normal. No respiratory distress.    Breath sounds: Normal breath sounds. No wheezing. Abdominal:    General: Bowel sounds are normal. There is no distension.    Palpations: Abdomen is soft. There is no mass.    Tenderness: There is no abdominal tenderness. There is no guarding or rebound. Musculoskeletal:       General: Normal range of motion.    Cervical back: Normal range of motion and neck supple. Skin:   General: Skin is warm and dry. Neurological:    General: No focal deficit present.    Mental Status: He is alert and oriented to person, place, and time. Mental status is at baseline. Psychiatric:       Mood and Affect: Mood normal.       Behavior: Behavior normal.       Thought Content: Thought content normal.       Judgment: Judgment normal. Assessment:  Ashe was seen today for new patient visit.Diagnoses and all orders for this visit:Dysphagia, unspecified type-     Case Request Operating Room: COLONOSCOPY, EGD, WITH DILATION USING SAVARY-GILLIARD DILATOR OVER GUIDEWIRE Anesthesia Pre-Operative Assessment? NoConstipation, unspecified constipation type-     Case Request Operating Room: COLONOSCOPY, EGD, WITH DILATION USING SAVARY-GILLIARD DILATOR OVER GUIDEWIRE Anesthesia Pre-Operative Assessment? NoOther orders-     polyethylene glycol (GOLYTELY ) 236 GM solution; Take 4,000 mLs by mouth once for  1 dose. .  Plan:   At this time we will proceed with an EGD and colonoscopy under MAC. The preparation, procedure, benefits and risks including bleeding, esophageal injury, bowel injury, and internal organ damage have been discussed in detail. All of the patients questions have been answered. They are agreeable to proceed. Charmaine Macario Rao, PA   [1] Past Medical History:Diagnosis Date  Cancer   Throat  Depression   High blood pressure    Hypertension   MVP (mitral valve prolapse)   Orthostatic hypotension  [2] Past Surgical History:Procedure Laterality Date  ELBOW FRACTURE SURGERY    GALLBLADDER SURGERY  2019  HERNIA REPAIR  2019  umbilical  NECK SURGERY Left 2018  radical dissection  TONSILLECTOMY  2017  [facility] Left tonsil [3] AllergiesAllergen Reactions  Shellfish-Derived Products Hives [4] Family HistoryProblem Relation Name Age of Onset  Cancer Father    Dementia Maternal Grandmother    Heart surgery Maternal Grandfather

## 2024-06-17 ENCOUNTER — Ambulatory Visit
Attending: Student in an Organized Health Care Education/Training Program | Admitting: Student in an Organized Health Care Education/Training Program

## 2024-06-17 ENCOUNTER — Other Ambulatory Visit: Payer: Self-pay

## 2024-06-17 DIAGNOSIS — G1229 Other motor neuron disease: Secondary | ICD-10-CM | POA: Insufficient documentation

## 2024-06-17 DIAGNOSIS — G609 Hereditary and idiopathic neuropathy, unspecified: Secondary | ICD-10-CM | POA: Insufficient documentation

## 2024-06-17 DIAGNOSIS — R253 Fasciculation: Secondary | ICD-10-CM | POA: Insufficient documentation

## 2024-06-17 DIAGNOSIS — G540 Brachial plexus disorders: Secondary | ICD-10-CM | POA: Insufficient documentation

## 2024-06-17 NOTE — Progress Notes (Signed)
 Patient underwent an EMG/NCS with Consultation with the [facility] [place] [facility]. For internal UR providers, please refer to the uploaded EMG/NCS under the Procedure tab for full details of the consult, nerve conduction studies, and needle EMG findings. For external providers, a full EMG/Consult report is to follow.James Oneill, MDNeurology PGY-5Clinical Neurophysiology Fellow09/02/25 at 6:28 PM

## 2024-06-18 ENCOUNTER — Telehealth: Payer: Self-pay

## 2024-06-18 ENCOUNTER — Encounter: Payer: Self-pay | Admitting: Student in an Organized Health Care Education/Training Program

## 2024-06-18 NOTE — Addendum Note (Signed)
 Addended by: REATHA DOVER A on: 06/18/2024 04:03 PM Modules accepted: Level of Service

## 2024-06-18 NOTE — Telephone Encounter (Signed)
 Writer called Endocrinology in [place] to confirm receipt of referral faxed on 05/26/21. Referral was received and a message is being sent to their scheduler to contact pt.

## 2024-06-18 NOTE — Procedures (Signed)
 Exam location: [address] Attending physician: Dr. Belvie Ice Patient: James Oneill  Patient ID: [id number]  Date of Birth: [birth year] Height: 5'10 Gender: Male Report ID: [id number] Exam Date: 06/17/2024 Referring Physician(s):  Dr. Eric Rong / Dr. Yancy Sheller Provider(s):  Dr. Belvie Ice / Vaughn Romberg / Dr. 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 male who was referred by Dr. Carlos Sollero for electrodiagnostic evaluation and focused neuromuscular consultation for progressive weakness and fasciculations.  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 He then had left neck dissection in September 2017 and September 2018. He apparently had a left tonsillectomy and base of tongue biopsy in June 2017. He was seen by Dr. Eduard Hanlon in 2018 for electrical sensations in his legs, arms, and body, along with headaches. At that time he had reduced sensation below his knees and left trapezius atrophy. He was seen by Dr. Arthor Gerri Dellen at [facility] on 05/15/2018 for evaluation of fasciculations. 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 He was seen by Dr. Alfrieda at [facility] neuromuscular clinic on 11/13/18 where he was noted to have left shoulder abduction weakness, left finger abduction weakness, rare fasciculations in the calves, reduced light touch in the left forearm and hand. EMG performed by Dr. Dorlene was essentially normal with isolated reduced motor unit recruitment in the left deltoid. 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   At this visit, he reports he developed pain and twitching over his neck and face after completing radiation in  2017. He noticed some difficulty swallowing as well after radiation. Over the years, the fasciculations have spread to his tongue and rest of his body. He has noticed slowly progressive weakness in his body but mainly over the left upper and lower extremities compared to right. He has started to have trips/near falls.   More recently, within the last year he has had more difficulty swallowing. He had trouble with food getting stuck in his throat after radiation, but this new trouble swallowing feels different. He will feel weak in the tongue or jaw muscles now, and be unable to chew/swallow his food. He has had to start drinking protein shakes to supplement calories because he is unable to eat regular meals. He has also started to have trouble with drooling/controlling his secretions. When he lays down he feels a tickle in his throat and has to cough, so he props himself up to sleep. He has an endoscopy planned at the end of this month. He gets winded easily but no trouble breathing at rest.   He has recently had trouble with orthostatic hypotension, and passes out about once a month. He gets lightheaded on standing. He also now notes that he is unable to sweat in the sauna, and does not need to shower as often because he does not produce sweat anywhere on his body except the top of his head.   He notes intermittent numbness maybe every other week for an hour of the left posterior lower leg. This resolves spontaneously.   His maternal grandmother had dementia with prominent behavioral/personality changes, and his mother may be starting to develop a similar clinical picture.   MRI c spine 2025 and MRI brain 2024 were unrevealing.  [  Past Medical History] Diagnosis Date Cancer   Throat Depression  High blood pressure  Hypertension  MVP (mitral valve prolapse)  Orthostatic hypotension   [Past Surgical History] Procedure Laterality Date ELBOW FRACTURE SURGERY    GALLBLADDER SURGERY  2019 HERNIA REPAIR  2019  umbilical NECK SURGERY Left 2018  radical dissection TONSILLECTOMY  2017  SCC Left tonsil  Family History: family history includes Cancer in his father; Dementia in his maternal grandmother; Heart surgery in his maternal grandfather. Social History:  reports that he has never smoked. He has never used smokeless tobacco. No alcohol use. He reports that he does not currently use drugs.   Allergies:  Allergies as of 06/17/2024 - Up to Date 06/13/2024 Allergen Reaction Noted Shellfish-derived products Hives 08/01/2021   Medication: He has a current medication list which includes the following prescription(s): generic dme, fluoxetine , quetiapine , esomeprazole  magnesium , hydrocodone -acetaminophen , carvedilol , levothyroxine , generic dme, cyclobenzaprine , oxycodone , acetaminophen , lidocaine , gabapentin , mupirocin , non-system medication, atorvastatin , and fluticasone .  A time out procedure was completed to verify the patient's identity including first and last name, date of birth and confirmed with id band. The correct procedure and site were reviewed with the patient.  Verbal consent was obtained prior to testing.  During performance of nerve conduction studies, distal limb temperature was maintained between 32 to 36 degrees Centigrade. Clinical Examination Focused Neurologic Exam MS: Alert and interactive. Language appears intact.  CN: VF full to finger counting. Pupils are 4/4 to 2/2. Ocular versions full and conjugate. Pursuits are smooth. Facial sensation intact to light touch.  Able to raise eye brows symmetrically. Able to bury lashes on eye closure with symmetrical resistance to opening. Difficulty puffing out cheeks against resistance. Hearing diminished to finger rub on the left. Palate elevates symmetrically. Shoulder shrug weak on the left. Tongue deviates to the left with left tongue hemiatrophy.  Abundant  fasciculations in the left greater than right tongue. Slight dysarthria.  Motor: Tone : normal Muscle bulk: atrophy in the left trapezius and diffusely through the left arm compared to the right.  Involuntary movements: fasciculations over the chin, left neck, vastus medius. Dr. Violetta also observed fasciculations in arms, calves and left medial foot during her exam.  Strength (R/L):   Shoulder abduction 5/4, forward flexion 5/4, external rotation 5/4, Internal rotation 5/4.  Elbow flexion 5/5, extension 5/5.  Wrist flexion 5/nt, extension 5/nt (broken wrist).  Finger flexion 5/5, extension 5/5, abduction 5/5.  Thumb abduction 5/5.  Hip flexion 5/4, abduction 5/4.  Knee flexion 5/5, extension 5/5.  Ankle dorsiflexion 5/5, plantarflexion 5/5, inversion 5/5, eversion 5/5.  Great toe extension 5/5.  Reflexes (R/L): Biceps 2+/2+, triceps 2+/2+, brachioradialis 2+/2+, knee jerk 2+/2+, ankle jerk 2+/2+.   Jaw jerk is increased.  Plantar response is mute.  No clonus.   Sensory: Pin Prick: Intact and symmetric in upper and lower extremities except for diminished in the left foot from the mid foot down. Light touch: Intact and symmetric in upper and lower extremities. Vibration (R/L, in seconds)  Great toe: 17/17  Medial malleolus: 20/20  Index finger: 25/25 Position Sense: Intact at the great toes bilaterally Romberg's sign: Negative  Coordination: Finger-Nose-Finger: Intact Heel to Shin: Intact  Gait: Stable on standing and turns. Narrow based gait with normal stride length and arm swing. Able to perform heel walk, toe walk. Able to squat and rise.   Motor Nerve Conduction Nerve and Stimulus Site  Onset Latency  Amplitude  Conduction Velocity  Distance  Normal  Fibular.L/EDB  Ankle  4.9 ms  7.4 mV       90 mm  yes  Fib head  12.6 ms  6.4 mV  43 m/s  330 mm  yes  Pop fossa  14.4 ms  6.6 mV  44 m/s  80 mm  yes  Median.L/APB  Wrist  3.7 ms  15.6 mV       70  mm  yes  Elbow  8.5 ms  15.2 mV  60 m/s  290 mm  yes  Tibial.L/AHB  Ankle  5.8 ms  16.6 mV       90 mm  yes  Pop fossa  14.1 ms  12.3 mV  45 m/s  370 mm  yes  Ulnar.L/ADM  Wrist  3.2 ms  13.3 mV       70 mm  yes  Bel elbow  6.8 ms  10.9 mV  58 m/s  210 mm  yes  Ab elbow  8.7 ms  10.7 mV  53 m/s  100 mm  yes  Elbow (M)  9.8 ms  2.8 mV       absent  yes  Sensory Nerve Conduction Nerve and Stimulus Site  Segment  Onset Latency  Amplitude  Conduction Velocity  Distance  Normal  Lateral Antebrachial Cutaneous.L  Elbow  Elbow-Lat forearm  1.2 ms  26.6 uV  67 m/s  80 mm  yes  Medial Antebrachial Cutaneous.L  Elbow  Elbow-Med forearm  1.6 ms  7 uV  absent  absent  yes  Medial Antebrachial Cutaneous.R  Elbow  Elbow-Med forearm  1 ms  8.4 uV  79 m/s  75 mm  yes  Medial plantar.L  Med sole  Med sole-Ankle  2.9 ms  6.8 uV  49 m/s  140 mm  yes  Median (Dig II).L  Mid palm  Mid palm-Dig II  1 ms  24.4 uV  62 m/s  60 mm  yes  Wrist  Wrist-Dig II  2.5 ms  20.5 uV  53 m/s  130 mm  yes       Wrist-Mid palm            47 m/s  70 mm  yes  Sural.L  Calf  Calf-Lat mall  2.2 ms  14.3 uV  56 m/s  120 mm  yes  Ulnar (Dig V).L  Wrist  Wrist-Dig V  2.2 ms  15.8 uV  53 m/s  115 mm  yes  Needle EMG Data Muscle S i d e Spontaneous Activity Motor Unit Morphology Interference Pattern   Insertional Activity Fibs/Pos. Waves Fascics Duration Amplitude Phases Activation Recruit ment Trapezius  L  CRD  1  2  2   Normal  1  Normal  -2  Infraspinatus  L  Normal  0  0  1  Normal  Normal  Normal  -1  Deltoid  L  Normal  0  0  Normal  Normal  Normal  Normal  Normal  Biceps Brachii  L  Normal  0  1  Normal  Normal  Normal  Normal  Normal  Triceps  L  Normal  0  0  1  Normal  Normal  Normal  -1  Extensor Digitorum Communis  L  Normal  0  0  1  Normal  Normal  Normal  -1  Extensor Indicis Proprius  L  Normal  0  0  1  Normal  Normal  Normal  -1  First Dorsal Interosseous  L  Normal  0  0  1  Normal  Normal  Normal  -1  Tongue  N  Increase  0  2  2  Normal  Normal  Normal  -2  Masseter  L                    Normal  Normal  Normal  Normal  Normal     Could not relax  Thoracic paraspinal (low)  L  Normal  0  0  Normal  Normal  Normal  Normal  Normal     T8  Thoracic paraspinal (low)  L  Normal  0  0  Normal  Normal  Normal  Normal  Normal     T9  Tensor Fasciae Latae  L  Normal  0  0  Normal  Normal  Normal  Normal  Normal  Biceps Femoris (long)  L  Normal  0  0  Normal  Normal  Normal  Normal  Normal  Vastus Medius  L  Normal  0  Slight Increase  Normal  Normal  Normal  Normal  Normal  Tibialis Anterior  L  Normal  0  0  Normal  Normal  Normal  Normal  Normal  Medial Gastrocnemius   L  Normal  0  0  Normal  Normal  Normal  Normal  Normal      Interpretation & Conclusions Electrodiagnostic Interpretation: This is an abnormal study.   The left tibial and fibular motor responses were normal. The left median and ulnar motor responses were normal. The left sural and medial plantar sensory responses were normal. The left median and ulnar sensory responses were normal. The bilateral medial antebrachial responses were normal and symmetric. The left lateral antebrachial response was normal.   Needle EMG demonstrated active and chronic denervation in the left trapezius and the tongue.  Chronic reinnervation was observed in the left infraspinatus, triceps, EDC, EIP, and FDI muscles. T8 and T9 thoracic paraspinal muscles were normal. Selected left lower extremity muscles were normal. There were no myokymic discharges observed in any muscle.   Overall, there is electrodiagnostic evidence most consistent with a chronic neurogenic process affecting motor neurons or motor axons in the bulbar and cervical regions with uncompensated denervation observed in the left tongue and trapezius muscles.  There is no evidence of a left brachial plexopathy, a left  median or ulnar neuropathy, a left lumbosacral radiculopathy, or a large fiber sensory neuropathy.   Compared to his prior study from 2020, he now has chronic reinnervation in multiple left upper extremity muscles. The left thoracic paraspinal and left lower extremity muscles remain normal. The tongue and trapezius muscles were not tested at that time.    Clinical Impression and Recommendations: Suraj Ramdass is a 48 y.o. 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 His exam is notable for left tongue hemiatrophy and fasiculations, left trapezius, shoulder, and hip weakness, normal tone, intact reflexes, and relatively intact sensation other than sensory changes over the left foot. His EMG/NCS today was consistent with a chronic neurogenic process affecting motor neurons or motor axons affecting bulbar and cervical regions with uncompensated denervation observed in the left tongue and trapezius muscles. The thoracic  paraspinal muscles and left lumbosacral muscles tested were normal. The differential diagnosis is post radiation lower motor neuron syndrome vs a progressive lower motor neuron disease such as progressive muscular atrophy (PMA).  It is also possible he had hypoglossal and accessory nerve injury with radiation and neck surgery contributing to his findings. He had no clear upper motor neuron findings other than a brisk jaw jerk to suggest ALS. In addition, we would have expected him to progress faster if he had ALS or PMA. 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 Unfortunately this condition can slowly progress and there are no treatments available. We recommend clinically monitoring him for changes and providing supportive care. Regarding his dysphagia, he has already had a barium swallow and is undergoing an endoscopy soon.  - will arrange for neuromuscular follow up in 3 months - Can consider repeat EMG/NCS in the future   As the teaching physician identified below, I was physically present for the key portions of the electrodiagnostic examination and I prepared the interpretation at the time of the examination.  Signature This report signed electronically by  Dr. Belvie Ice on 06/18/2024 4:01 PM   Dr. Belvie Ice / Vaughn Romberg / Dr. Almarie Byes

## 2024-06-20 ENCOUNTER — Telehealth: Payer: Self-pay | Admitting: Primary Care

## 2024-06-20 DIAGNOSIS — G9589 Other specified diseases of spinal cord: Secondary | ICD-10-CM

## 2024-06-20 MED ORDER — NON-SYSTEM MEDICATION *A*
0 refills | Status: AC
Start: 2024-06-20 — End: ?

## 2024-06-20 NOTE — Telephone Encounter (Signed)
 [facility] is requesting an electric hospital bed.  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

## 2024-06-20 NOTE — Telephone Encounter (Signed)
 Hospital bed ordered on Parachute through [facility] [place].

## 2024-06-20 NOTE — Telephone Encounter (Signed)
 Patient is needing a prescription as he is now needing a hospital bed to take to DME.

## 2024-06-23 NOTE — Telephone Encounter (Signed)
 James Oneill [facility]9/5 ? 2:[email or web address]  Sherren We are going to need the actual face to face notes for the bed.  We can't accept parachute notes for MCR.  Please update and advise.  Thank you.Please see note from DME.  Patient has an appointment on Friday, can we bring him in sooner? After that appt I  can upload OV note.

## 2024-06-23 NOTE — Telephone Encounter (Signed)
 I spoke with James Oneill's mother - patient already has an appointment 9/12 - I offered to move appointment sooner but she declined

## 2024-06-24 ENCOUNTER — Other Ambulatory Visit: Payer: Self-pay

## 2024-06-24 ENCOUNTER — Ambulatory Visit: Payer: Self-pay | Attending: Otolaryngology | Admitting: Otolaryngology

## 2024-06-24 VITALS — BP 125/85 | HR 92 | Temp 97.7°F | Resp 16 | Wt 178.6 lb

## 2024-06-24 DIAGNOSIS — R490 Dysphonia: Secondary | ICD-10-CM | POA: Insufficient documentation

## 2024-06-24 DIAGNOSIS — R1312 Dysphagia, oropharyngeal phase: Secondary | ICD-10-CM | POA: Insufficient documentation

## 2024-06-24 DIAGNOSIS — C109 Malignant neoplasm of oropharynx, unspecified: Secondary | ICD-10-CM | POA: Insufficient documentation

## 2024-06-24 DIAGNOSIS — G959 Disease of spinal cord, unspecified: Secondary | ICD-10-CM | POA: Insufficient documentation

## 2024-06-24 NOTE — Progress Notes (Signed)
 [name] returns for followup. [name] is six year s/p surgery and radiation for a tonsillar carcinoma. From a cancer standpoing he has been doing well. However, he has had progressive issues with pain, spasticity, and weakness. He was seen by neurology and diagnosed with radiation induced neuropathy. He is taking multiple medications for pain and spasticity from different providers and is experiencing side effects such as constipation. Over the past few months he has been intermittently hoarse to a moderate/severe degree. He does lose his voice completely and feels that it doesn't come back to normal. It is worse as the day goes on. There has been no stridor. He denies any aspiration. His swallowing has been poor but stable. The remainder of his history was negative today. A complete and comprehensive otolaryngologic examination including the ears, nose, mouth, throat, and neck was performed. Blood pressure 125/85, pulse 92, temperature 36.5 C (97.7 F), temperature source Temporal, resp. rate 16, weight 81 kg (178 lb 9.6 oz), SpO2 99%. His voice is mildly raspy. There is no stridorExamination of the neck reveals postsurgical and radiation changes. There is no adenopathy. Normal tracheal and laryngeal crepitus is intact. The ears are clear. The oral cavity, oropharynx, and nasal cavity are unremarkable. Due to his active gag reflex, mirror examination was inadequate. Flexible laryngoscopy revealed mobile vocal folds and no evidence of recurrent disease or other abnormalities.  The remainder of my examination revealed no significant abnormalities His TSH is 4.47 but his FT4 is 1.1I did review the notes from the neurology team. Alamin is doing well from a cancer standpoint. I will continue to see him annually for second primary and late effect surveillance. His dysphonia is likely multifactorial and potentially related to his myelopathy. I have referred him to speech pathology  for evaluation and therapy. I also believe he would benefit from a formal pain management consultation given the complexity of his situation. He is agreeable and I have made a referral. PROCEDURE NOTE:The nasal cavity was anesthetized with half percent tetracaine and epinephrine . The flexible laryngoscope was inserted through the nasal cavity and passed atraumatically into the pharynx. The pharynx and larynx were visualized. The vocal folds were mobile. The airway is widely patent. There are mild to moderate post-radiation changes but no sign of recurrent disease or other abnormalities. There were no masses or lesions. The scope was withdrawn. He tolerated the procedure well.

## 2024-06-25 ENCOUNTER — Ambulatory Visit: Payer: Self-pay | Attending: Medical | Admitting: Physical Therapy

## 2024-06-25 DIAGNOSIS — M6281 Muscle weakness (generalized): Secondary | ICD-10-CM | POA: Insufficient documentation

## 2024-06-25 DIAGNOSIS — S52615D Nondisplaced fracture of left ulna styloid process, subsequent encounter for closed fracture with routine healing: Secondary | ICD-10-CM | POA: Insufficient documentation
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
## 2024-06-27 ENCOUNTER — Encounter: Payer: Self-pay | Admitting: Primary Care

## 2024-06-27 ENCOUNTER — Telehealth: Payer: Self-pay | Admitting: Primary Care

## 2024-06-27 ENCOUNTER — Other Ambulatory Visit: Payer: Self-pay

## 2024-06-27 ENCOUNTER — Ambulatory Visit: Attending: Primary Care | Admitting: Primary Care

## 2024-06-27 VITALS — BP 118/86 | HR 75 | Temp 97.3°F | Ht 70.0 in | Wt 178.8 lb

## 2024-06-27 DIAGNOSIS — G9589 Other specified diseases of spinal cord: Secondary | ICD-10-CM

## 2024-06-27 DIAGNOSIS — R0989 Other specified symptoms and signs involving the circulatory and respiratory systems: Secondary | ICD-10-CM

## 2024-06-27 DIAGNOSIS — E039 Hypothyroidism, unspecified: Secondary | ICD-10-CM

## 2024-06-27 DIAGNOSIS — E785 Hyperlipidemia, unspecified: Secondary | ICD-10-CM

## 2024-06-27 DIAGNOSIS — Z0000 Encounter for general adult medical examination without abnormal findings: Secondary | ICD-10-CM

## 2024-06-27 MED ORDER — NON-SYSTEM MEDICATION *A*
0 refills | Status: AC
Start: 2024-06-27 — End: ?

## 2024-06-27 NOTE — Progress Notes (Addendum)
 Patient ID: James Oneill is a 48 y.o. year old male.Chief Complaint Patient presents with  Initial Annual Medicare visit   Medicare wellness HPI: James Oneill is here today for reevaluation of his labile hypertension hyperlipidemia hypothyroidismHe was recently diagnosed with radiation-induced motor neuron disease.He has progressive weakness in his left upper extremity with increasing hoarseness and difficulty swallowing.He would like a hospital bed.  He needs to be able to change frequently and needs to be able to sit upright at least 30 degrees because lying flat can often cause him to cough.  I therefore recommend he has an electric hospital bedLab work below was reviewed with him.  One of his providers questions whether we need to increase the dose of levothyroxineThe medication and allergy list was reviewed and is accurateAllergy / Social History / Medications:Allergies[1]Social History Tobacco Use  Smoking status: Never  Smokeless tobacco: Never Substance Use Topics  Alcohol use: Yes   Alcohol/week: 1.0 standard drink of alcohol   Types: 1 Glasses of wine per week Patient's Medications New Prescriptions  NON-SYSTEM MEDICATION    Medication/Supply: Hospital bed with electric operated headrestDirections for Use: Previous Medications  ACETAMINOPHEN  (TYLENOL ) 325 MG TABLET    Take 2 tablets (650 mg total) by mouth every 6 hours as needed.  ATORVASTATIN  (LIPITOR) 40 MG TABLET    Take 1 tablet (40 mg total) by mouth daily.  CARVEDILOL  (COREG ) 6.25 MG TABLET    Take 1 tablet (6.25 mg total) by mouth 2 times daily (with meals) for High Blood Pressure.  CYCLOBENZAPRINE  (FLEXERIL ) 5 MG TABLET    Take 1 tablet (5 mg total) by mouth 3 times daily as needed for Muscle spasms.  ESOMEPRAZOLE  MAGNESIUM  (NEXIUM ) 40 MG CAPSULE    Take 1 capsule (40 mg total) by mouth daily (before breakfast).  FLUOXETINE  (PROZAC ) 40 MG CAPSULE    Take 1 capsule (40 mg  total) by mouth daily.  FLUTICASONE  (FLONASE ) 50 MCG/ACT NASAL SPRAY    Spray 1 spray into nostril daily  GABAPENTIN  600 MG TABLET    TAKE 1 TABLET BY MOUTH THREE TIMES DAILY  GENERIC DME    Item to dispense: sling  Instructions for use: daily  GENERIC DME    Item to dispense: cock up wrist brace  Instructions for use: daily  HYDROCODONE -ACETAMINOPHEN  (NORCO) 5-325 MG PER TABLET    Take 1 tablet by mouth every 6 hours as needed for Pain. Max daily dose: 4 tablets  LEVOTHYROXINE  (SYNTHROID , LEVOTHROID) 25 MCG TABLET    Take 1 tablet (25 mcg total) by mouth daily (before breakfast).  LIDOCAINE  (LIDODERM ) 5 % PATCH    Apply 1 patch onto the skin every 24 hours over 12 hours. to the following areas: left thigh pain area. Remove and discard patch within 12 hours or as directed.  MUPIROCIN  (BACTROBAN ) 2 % OINTMENT    Apply topically daily for Inflammation of Hair Follicles in the Nose. to the following areas: nose - only if area still infected  NON-SYSTEM MEDICATION    Medication/Supply: below knee compression stockingsDirections for Use: remove at night  NON-SYSTEM MEDICATION    Medication/Supply: Electric hospital bed  OXYCODONE  (ROXICODONE ) 5 MG IMMEDIATE RELEASE TABLET    Take 1 tablet (5 mg total) by mouth every 4-6 hours as needed for Pain. Max daily dose: 30 mg  QUETIAPINE  (SEROQUEL ) 50 MG TABLET    Take 1 tablet (50 mg total) by mouth nightly. Modified Medications  No medications on file Discontinued Medications  No medications on file  Physical  Exam:Vitals:  06/27/24 1011 BP: 118/86 Pulse: 75 Temp: 36.3 C (97.3 F) Weight: 81.1 kg (178 lb 12.8 oz) Height: 1.778 m (5' 10) SpO2 Readings from Last 3 Encounters: 06/27/24 98% 06/24/24 99% 06/13/24 98%  Estimated body mass index is 25.66 kg/m as calculated from the following:  Height as of this encounter: 1.778 m (5' 10).  Weight as of this encounter: 81.1 kg (178 lb 12.8 oz).BP Readings  from Last 3 Encounters: 06/27/24 118/86 06/24/24 125/85 06/13/24 124/87 Wt Readings from Last 3 Encounters: 06/27/24 81.1 kg (178 lb 12.8 oz) 06/24/24 81 kg (178 lb 9.6 oz) 06/13/24 80.3 kg (177 lb) Looks well.  Not in any acute distressNeck-supple no adenopathy or thyromegalyCardiovascular-heart rate regular.  Heart sounds 1 and 2 with no added sounds no carotid bruits no edemaRespiratory-normal respiratory effort.  Good bilateral air entry.  Lungs are clear to palpation percussion and auscultationRecent Lab Results: Latest Reference Range & Units 06/12/24 08:54 Sodium 133 - 145 mmol/L 140 Potassium 3.3 - 4.6 mmol/L 4.4 Chloride 96 - 108 mmol/L 102 CO2 20 - 28 mmol/L 27 Anion Gap 7 - 16  11 UN 6 - 20 mg/dL 16 Creatinine 9.32 - 8.82 mg/dL 8.81 (H) eGFR BY CREAT  75 Glucose 60 - 99 mg/dL 94 Calcium  8.6 - 10.2 mg/dL 9.9 Total Protein 6.3 - 7.7 g/dL 6.7 Albumin 3.5 - 5.2 g/dL 4.6 ALT 0 - 50 U/L 32 AST 0 - 50 U/L 28 Alk Phos 40 - 130 U/L 63 Bilirubin,Total 0.0 - 1.2 mg/dL 1.5 (H) Cholesterol mg/dL 877 Triglycerides mg/dL 802 ! HDL Cholesterol 40 - 60 mg/dL 43 LDL Calculated mg/dL 47 Non HDL Cholesterol mg/dL 79 Chol/HDL Ratio  2.8 TSH 0.27 - 5.79 uIU/mL 4.47 (H) Free T4 0.9 - 1.7 ng/dL 1.1 (H): Data is abnormally high!: Data is abnormalAssessment and Plan: 1.  Radiation-induced motor neuron disease.  Because of progressive weakness in the upper extremities and difficulty swallowing.  He is at risk of aspiration.He needs to be able to change his position quickly which would not be feasible without the assistance of an electric bed2.  Labile hypertension.  Blood pressure at goal today3.  Hyperlipidemia.  LDL at goal4.  Hypothyroidism.  TSH is slightly elevated.  However T4 is normal.  I do not recommend any adjustments to levothyroxine  at this timeContinue current medication low-fat low-salt dietPlan follow-up  in 3 monthsOrders this visitOrders Placed This Encounter Procedures  TSH  T4, free  Comprehensive metabolic panel  Lipid Panel (Reflex to Direct  LDL if Triglycerides more than 400)  [1] AllergiesAllergen Reactions  Shellfish-Derived Products Hives

## 2024-06-27 NOTE — Telephone Encounter (Signed)
 Fax number [phone] requesting the signed order  and information faxed to them for a hospital bed? Please send

## 2024-06-27 NOTE — Progress Notes (Signed)
 Visit performed as:        Office Visit, met with patient in personToday we reviewed and updated James Oneill's smoking status, activities of daily living, depression screen, fall risk, medications and allergies. I have counseled the patient in the above areas. Subjective: Chief Complaint: James Oneill is a 48 y.o. male here for a/an Initial Annual Medicare visit (Medicare wellness)In general, James Oneill rates their overall health jd:[id number] Care Team:James Oneill, Yancy, MD as PCP - General (Primary James Cough, MD (Otolaryngology)James Oneill, James Barter, MD (Radiation Oncology) Current Outpatient Medications on File Prior to Visit Medication Sig Dispense Refill  Non-System Medication Medication/Supply: Electric hospital bed 1 each 0  generic DME Item to dispense: cock up wrist brace  Instructions for use: daily 1 each 0  FLUoxetine  (PROZAC ) 40 mg capsule Take 1 capsule (40 mg total) by mouth daily. 30 capsule 2  QUEtiapine  (SEROQUEL ) 50 mg tablet Take 1 tablet (50 mg total) by mouth nightly. 30 tablet 2  esomeprazole  magnesium  (NEXIUM ) 40 mg capsule Take 1 capsule (40 mg total) by mouth daily (before breakfast). 90 capsule 2  carvedilol  (COREG ) 6.25 mg tablet Take 1 tablet (6.25 mg total) by mouth 2 times daily (with meals) for High Blood Pressure. 180 tablet 3  levothyroxine  (SYNTHROID , LEVOTHROID) 25 mcg tablet Take 1 tablet (25 mcg total) by mouth daily (before breakfast). 30 tablet 2  generic DME Item to dispense: sling  Instructions for use: daily 1 each 0  cyclobenzaprine  (FLEXERIL ) 5 mg tablet Take 1 tablet (5 mg total) by mouth 3 times daily as needed for Muscle spasms. 30 tablet 2  oxyCODONE  (ROXICODONE ) 5 mg immediate release tablet Take 1 tablet (5 mg total) by mouth every 4-6 hours as needed for Pain. Max daily dose: 30 mg 8 tablet 0  lidocaine  (LIDODERM ) 5 % patch Apply 1 patch onto the skin every 24 hours over 12 hours. to  the following areas: left thigh pain area. Remove and discard patch within 12 hours or as directed. 10 each 0  GABAPENTIN  600 MG tablet TAKE 1 TABLET BY MOUTH THREE TIMES DAILY 270 tablet 2  mupirocin  (BACTROBAN ) 2 % ointment Apply topically daily for Inflammation of Hair Follicles in the Nose. to the following areas: nose - only if area still infected 22 g 3  Non-System Medication Medication/Supply: below knee compression stockingsDirections for Use: remove at night 1 each 0  atorvastatin  (LIPITOR) 40 mg tablet Take 1 tablet (40 mg total) by mouth daily. 90 tablet 3  fluticasone  (FLONASE ) 50 MCG/ACT nasal spray Spray 1 spray into nostril daily 16 g 5  HYDROcodone -acetaminophen  (NORCO) 5-325 mg per tablet Take 1 tablet by mouth every 6 hours as needed for Pain. Max daily dose: 4 tablets 28 tablet 0  acetaminophen  (TYLENOL ) 325 mg tablet Take 2 tablets (650 mg total) by mouth every 6 hours as needed. 100 tablet 0 No current facility-administered medications on file prior to visit. 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  Chronic fatigue 07/01/2022  MVP (mitral valve prolapse) 06/30/2022  Depression 06/30/2022  History of cholecystectomy 11/13/2018  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 Past Medical History: Diagnosis Date  Cancer   Throat  Depression   High blood pressure   Hypertension   MVP (mitral valve prolapse)   Orthostatic hypotension  Past Surgical History: Procedure  Laterality Date  ELBOW FRACTURE SURGERY    GALLBLADDER SURGERY  2019  HERNIA REPAIR  2019  umbilical  NECK SURGERY Left 2018  radical dissection  TONSILLECTOMY  2017  SCC Left tonsil Family History Problem Relation Name Age of Onset  Cancer Father    Dementia Maternal Grandmother    Heart surgery Maternal Grandfather   Social History Socioeconomic History  Marital status: Single Tobacco Use  Smoking status: Never  Smokeless tobacco: Never Substance and Sexual Activity  Alcohol use: Yes   Alcohol/week: 1.0 standard drink of alcohol   Types: 1 Glasses of wine per week  Drug use: Not Currently  Sexual activity: Not Currently Objective: Vital Signs: BP 118/86 (BP Location: Left arm, Patient Position: Sitting, Cuff Size: adult)   Pulse 75   Temp 36.3 C (97.3 F) (Skin)   Ht 1.778 m (5' 10)   Wt 81.1 kg (178 lb 12.8 oz)   SpO2 98%   BMI 25.66 kg/m  BMI: Body mass index is 25.66 kg/m.Vision Screening Results (Welcome visit only):No results found.Depression Screening Results:Review Flowsheet  More data exists   06/27/2024 06/24/2024 06/12/2024 03/27/2024 03/14/2024 10/12/2023 08/20/2023 PHQ Scores PHQ Q9 - Better Off Dead - - - 0  - 0  - PHQ Calculated Score 0 6 0 24 0 18 0  Details    Data saved with a previous flowsheet row definition   No questionnaires on file. Opioid Use/DAST- 10 Screening Results: How many times in the past year have you used an illegal drug or used a prescription medication for nonmedical reasons?: 0 (06/27/2024 10:13 AM)Activities of Daily Living/Functional Screening Results:Is the person deaf or does he/she have serious difficulty hearing?: N (06/27/2024 10:12 AM)Is this person blind or does he/she have serious difficulty seeing even when wearing glasses?: N (06/27/2024 10:12 AM)Does this person have serious difficulty walking or climbing stairs?: Y (06/27/2024 10:12  AM)*Walks in Home: Needs Assistance (06/27/2024 10:12 AM)*Climbing Stairs: Needs Assistance (06/27/2024 10:12 AM)Does this person have difficulty dressing or bathing?: N (06/27/2024 10:12 AM)*Shopping: Needs Assistance (06/27/2024 10:12 AM)*House Keeping: Needs Assistance (06/27/2024 10:12 AM)*Managing Own Medications: Needs Assistance (06/27/2024 10:12 AM)*Handling Finances: Needs Assistance (06/27/2024 10:12 AM)Difficulty doing errands due to a physicial, mental or emotional condition: No (06/27/2024 10:12 AM)Difficulty remembering or making decisions due to a physicial, mental or emotional condition: No (06/27/2024 10:12 AM)Fall Risk Screening Results:No data recordedAssessment and Plan: Cognitive Function:Recall of recent and remote events appears:Normal  Advanced Care Planning:was discussed and patient received paperwork to review The following health maintenance plan was reviewed with the patient:Health Maintenance Topics with due status: Overdue   Topic Date Due  Colon Cancer Screening Other Never done  HIV Screening [place] Never done  IMM-Hepatitis B Vaccine Never done  IMM-Zoster Never done  IMM Pneumo: 50+ Years Never done  IMM DTaP/Tdap/Td Never done  IMM-Influenza 06/16/2024  COVID-19 Vaccine 06/16/2024 Health Maintenance Topics with due status: Not Due   Topic Last Completion Date  Depression Screen Monthly 06/27/2024 Health Maintenance Topics with due status: Completed   Topic Last Completion Date  Hepatitis C Screening [place] 06/03/2024 Health Maintenance Topics with due status: Aged Out   Topic Date Due  IMM-HIB 0-5 Yrs or At-Risk Patients Aged Out  IMM-HPV 9-26 Yrs or Shared Decision (27-45 Yrs) Aged Out  IMM-MCV4 0-18 Yrs or At-Risk Patients Aged Out  IMM-Rotavirus 0-8  Months Aged Out  IMM-MenB (2 Plans: Shared decision & Increased Risk Plans) Aged Out This health maintenance schedule,  identified risks, a list of orders placed today and patient goals have been provided to James Oneill in the after visit summary. Plan for any concerns identified during screening or risk assessments:Highly recommend influenza and COVID vaccination this seasonHas an upcoming colonoscopy

## 2024-06-27 NOTE — Patient Instructions (Signed)
 Thank you for completing your Initial Annual Medicare visit (Medicare wellness) with us  today. The purpose of this visits was un:Drmzzw for diseaseAssess risk of future medical problemsHelp develop a healthy lifestyleUpdate vaccinesGet to know your doctor in case of an illnessPatient Care Team:Ashaad Gaertner, Yancy, MD as PCP - General (Primary Campbell Cough, MD (Otolaryngology)Cummings, Ozell Barter, MD (Radiation Oncology) Medicare 5 Year PlanThe following items were identified as areas of concern during your screening today:BMI greater than 25 - This is a risk for Heart Attack, Stroke, High Blood Pressure, Diabetes, High Cholesterol and other complications. The Health Maintenance table below identifies screening tests and immunizations recommended by your health care team:Health Maintenance: These screening recommendations are based on [place], [employer], and [place] state guidelines Topic Date Due . Colon Cancer Screening - Other  Never done . HIV Screening  Never done . Hepatitis B Vaccine (1 of 3 - 19+ 3-dose series) Never done . Shingles Vaccine (1 of 2) Never done . Pneumococcal Vaccination (1 of 2 - PCV) Never done . DTaP/Tdap/Td Vaccines (1 - Tdap) Never done . Flu Shot (1) 06/16/2024 . COVID-19 Vaccine (5 - 2025-26 season) 06/16/2024 . Depression - Monthly  07/27/2024 . Hepatitis C Screening  Completed . HIB Vaccine  Aged Out . HPV Vaccine  Aged Out . Meningococcal Vaccine  Aged Out . Rotavirus Vaccine  Aged Out . Meningitis Vaccine  Aged Out In addition, goals and orders placed to address these recommendations are listed in the Today's Visit section.We wish you the best of health and look forward to seeing you again next year for your Annual Medicare Wellness Visit. If you have any health care concerns before then, please do not hesitate to contact us .

## 2024-06-28 ENCOUNTER — Other Ambulatory Visit: Payer: Self-pay | Admitting: Primary Care

## 2024-06-30 ENCOUNTER — Other Ambulatory Visit: Payer: Self-pay | Admitting: Primary Care

## 2024-06-30 MED ORDER — HYDROCODONE-ACETAMINOPHEN 5-325 MG PO TABS *I*: 1.0000 | ORAL_TABLET | Freq: Four times a day (QID) | ORAL | 0 refills | Status: DC | PRN

## 2024-06-30 MED ORDER — CYCLOBENZAPRINE HCL 5 MG PO TABS *I*: 5.0000 mg | ORAL_TABLET | Freq: Three times a day (TID) | ORAL | 2 refills | Status: DC | PRN

## 2024-06-30 MED ORDER — ATORVASTATIN CALCIUM 40 MG PO TABS *I*
40.0000 mg | ORAL_TABLET | Freq: Every day | ORAL | 3 refills | Status: AC
Start: 2024-06-30 — End: 2025-06-30

## 2024-06-30 NOTE — Telephone Encounter (Signed)
 Patient is calling stating the information has not been received yet and they are still not helping him get the bed.

## 2024-06-30 NOTE — Telephone Encounter (Signed)
 OV note has been up loaded to parachute.

## 2024-06-30 NOTE — Telephone Encounter (Signed)
 Please fax to [phone]

## 2024-06-30 NOTE — Telephone Encounter (Deleted)
 Dup encounter. Please see other.

## 2024-06-30 NOTE — Telephone Encounter (Signed)
 Patient aware this is going through [facility], he will follow up with them.

## 2024-07-01 ENCOUNTER — Ambulatory Visit

## 2024-07-01 NOTE — Telephone Encounter (Signed)
Addended note faxed.

## 2024-07-01 NOTE — Telephone Encounter (Signed)
 Dr, can you please addend your note, it must specifically state the patient requires hospital bed for frequent change in body position to alleviate pain or needs head of bed elevated 30 degrees due to his diagnosis. Thank you.

## 2024-07-01 NOTE — Telephone Encounter (Signed)
 Please see response from [facility].@[email or web address] - I am so sorry to be a thorn in your side today. Unfortunately, the notes that we received do not have enough information in order to meet medical necessity. For a bed to be covered by Medicare, notes must include:a). the patient has a medical condition which requires positioning of the body in ways not feasible with an ordinary bed.b). the patient requires positioning of the body in ways not feasible with an ordinary bed in order to alleviate pain.c). the patient requires the head of the bed to be elevated more than 30 degrees most of the time due to congestive heart failure, chronic pulmonary disease, or problems with aspiration.d). the patient requires traction equipment, which can only be attached to a hospital bed.e). when a semi-electric bed is ordered, coverage requires the documentation of one of the above (a-d) plus the need for frequent body changes in body position, or the need for an immediate change in body position.

## 2024-07-01 NOTE — Telephone Encounter (Signed)
 Addended note uploaded to parachute

## 2024-07-02 NOTE — Telephone Encounter (Signed)
 Update from [facility], patient now does not want the bed as he has not met his deductible.

## 2024-07-04 ENCOUNTER — Ambulatory Visit

## 2024-07-08 ENCOUNTER — Telehealth: Payer: Self-pay | Admitting: Primary Care

## 2024-07-08 ENCOUNTER — Other Ambulatory Visit: Payer: Self-pay

## 2024-07-08 ENCOUNTER — Telehealth: Payer: Self-pay | Admitting: Otolaryngology

## 2024-07-08 DIAGNOSIS — C109 Malignant neoplasm of oropharynx, unspecified: Secondary | ICD-10-CM

## 2024-07-08 NOTE — Telephone Encounter (Signed)
 Calling in regards to referral for Speech therapy- they do not do speech therapy there and asking if you want them to pass along the referral elsewhere or dispose of this. Asking for call back- Verified number. [phone]

## 2024-07-08 NOTE — Progress Notes (Signed)
 SLP referral

## 2024-07-08 NOTE — Telephone Encounter (Signed)
 Writer will reach out to [facility] for scheduling.

## 2024-07-08 NOTE — Telephone Encounter (Signed)
 Patient mom called to see if we had active medicaid on fileShe is aware we did not have this on his chart

## 2024-07-09 ENCOUNTER — Ambulatory Visit

## 2024-07-11 ENCOUNTER — Ambulatory Visit

## 2024-07-11 NOTE — Progress Notes (Unsigned)
 [facility] ServicesDischarge From Care Name: James Oneill: Jun 20, 1977Referring Physician: Janus Sinclair Kimber*Today's Date: 9/26/2025Diagnosis: 1. Closed nondisplaced fracture of styloid process of left ulna with routine healing, subsequent encounter    2. Muscle weakness (generalized)      Subjective: James Oneill is a 48 y.o. male is being discharge for Non-compliance to attendance guidelines . Pt has received care for 1 visits to date. Attendance: 3 No Shows  Compliance: Poor Functional abilities assessed on prior visits or phone conversation: unable to assessComment: Discharged due to no showing all follow up appointments.Treatment Plan:James Oneill is currently being discharged from rehabilitation services at this time.  If you should feel further services are appropriate at a future date, please forward an updated referral.  Also, please feel free to contact me with any questions or concerns regarding this patient.  Thank you for referring this patient to [facility]. Jacquline Louder, PT

## 2024-07-11 NOTE — Invasive Procedure Plan of Care (Signed)
 CONSENT FOR MEDICALOR SURGICAL PROCEDURE                            Patient Name: James Oneill [id number] MR                                                              DOB: 21-Dec-1975     Please read this form or have someone read it to you. It's important to understand all parts of this form. If something isn't clear, ask us  to explain. When you sign it, that means you understand the form and give us  permission to do this surgery or procedure. I agree for Reidlinger, Evalene Sharper, MD along with any assistants* they may choose, to treat the following condition(s): Colon cancer screening, GERD, difficulty swallowing, abdominal pain, lower GI abnormalities, bleeding By doing this surgery or procedure on me: Evaluate the upper and lower parts of the GI tract with a scope and a camera; stretch esophagus if needed This is also known as: Colonoscopy and Upper Endoscopy; esophageal dilatation if indicated Laterality: Not applicable *if you'd like a list of the assistants, please ask. We can give that to you.1. The care provider has explained my condition to me. They have told me how the procedure can help me. They have told me about other ways of treating my condition. I understand the care provider cannot guarantee the result of the procedure. If I don't have this procedure, my other choices are: No upper endoscopy/colonoscopy2. The care provider has told me the risks (problems that can happen) of the procedure. I understand there may be unwanted results. The risks that are related to this procedure include: Bleeding, perforation3. I understand that during the procedure, my care provider may find a condition that we didn't know about before the treatment started. Therefore, I agree that my care provider can perform any other treatment which they think is necessary and available.4. I give permission to the hospital and/or its departments to examine and keep tissue, blood, body  parts, fluids or materials removed from my body during the procedure(s) to aid in diagnosis and treatment, after which they may be used for scientific research or teaching by appropriate persons. If these materials are used for science or teaching, my identity will be protected. I will no longer own or have any rights to these materials regardless of how they may be used.5. My care provider might want a representative from a medical device company to be there during my procedure. I understand that person works for:        The ways they might help my care provider during my procedure include:      6. Here are my decisions about receiving blood, blood products, or tissues. I understand my decisions cover the time before, during and after my procedure, my treatment, and my time in the hospital. After my procedure, if my condition changes a lot, my care provider will talk with me again about receiving blood or blood products. At that time, my care provider might need me to review and sign another consent form, about getting or refusing blood.I understand that the blood is from the community blood supply. Volunteers donated the blood, the volunteers were screened for health problems.  The blood was examined with very sensitive and accurate tests to look for hepatitis, HIV/AIDS, and other diseases. Before I receive blood, it is tested again to make sure it is the correct type.My chances of getting a sickness from blood products are small. But no transfusion is 100% safe. I understand that my care provider feels the good I will receive from the blood is greater than the chances of something going wrong. My care provider has answered my questions about blood products.My decisionabout blood orblood products Not applicable.  My decision about tissueImplants Not applicable.  I understand thisform.My care provideror his/herassistants haveexplained: What I am  having done and why I need it.What other choices I can make instead of having this done.The benefits and possible risks (problems) to me of having this done.The benefits and possible risks (problems) to me of receiving transplants, blood, or blood products.There is no guarantee of the results.The care provider may not stay with me the entire time that I am in the operating or procedure room.My provider has explained how this may affect my procedure. My provider has answered myquestions about this. I give mypermission forthis surgery orprocedure.        _________________________________________                                   My signature(or parent or other person authorized to sign for you, if you are unable to sign for yourself or if you are under 67 years old)       _____________   Date    (MM/DD/YYYY)     _______  Time  Electronic Signatures will display at the bottom of the consent form.Care provider's statement: I have discussed the planned procedure, including the possibility for transfusion of bloodproducts or receipt of tissue as necessary; expected benefits; the possible complications and risks; and possible alternativesand their benefits and risks with the patients or the patient's surrogate. In my opinion, the patient or the patient's surrogateunderstands the proposed procedure, its risks, benefits and alternatives.      Electronically signed by: Charmaine Macario Rao, PA                                          07/11/2024       Date    1:27 PM      Time

## 2024-07-14 ENCOUNTER — Encounter: Admission: RE | Payer: Self-pay | Source: Ambulatory Visit

## 2024-07-14 ENCOUNTER — Ambulatory Visit: Admission: RE | Admit: 2024-07-14 | Payer: Self-pay | Source: Ambulatory Visit | Admitting: Surgery

## 2024-07-14 DIAGNOSIS — R1310 Dysphagia, unspecified: Secondary | ICD-10-CM | POA: Insufficient documentation

## 2024-07-14 DIAGNOSIS — K5900 Constipation, unspecified: Secondary | ICD-10-CM | POA: Insufficient documentation

## 2024-07-14 SURGERY — COLONOSCOPY
Anesthesia: Monitor Anesthesia Care

## 2024-07-16 ENCOUNTER — Ambulatory Visit: Payer: Self-pay | Admitting: Surgery

## 2024-07-16 ENCOUNTER — Ambulatory Visit

## 2024-07-18 ENCOUNTER — Ambulatory Visit

## 2024-07-21 ENCOUNTER — Ambulatory Visit: Payer: Self-pay | Admitting: Medical

## 2024-07-22 ENCOUNTER — Other Ambulatory Visit: Payer: Self-pay

## 2024-07-22 ENCOUNTER — Emergency Department

## 2024-07-22 ENCOUNTER — Ambulatory Visit

## 2024-07-22 ENCOUNTER — Emergency Department
Admission: EM | Admit: 2024-07-22 | Discharge: 2024-07-22 | Disposition: A | Discharge status: Home / Self Care | Source: Ambulatory Visit | Attending: Urgent Care | Admitting: Urgent Care

## 2024-07-22 DIAGNOSIS — R9431 Abnormal electrocardiogram [ECG] [EKG]: Secondary | ICD-10-CM

## 2024-07-22 DIAGNOSIS — R000 Tachycardia, unspecified: Secondary | ICD-10-CM | POA: Insufficient documentation

## 2024-07-22 DIAGNOSIS — K5190 Ulcerative colitis, unspecified, without complications: Secondary | ICD-10-CM | POA: Insufficient documentation

## 2024-07-22 DIAGNOSIS — I10 Essential (primary) hypertension: Secondary | ICD-10-CM | POA: Insufficient documentation

## 2024-07-22 DIAGNOSIS — R519 Headache, unspecified: Secondary | ICD-10-CM | POA: Insufficient documentation

## 2024-07-22 LAB — PROTIME-INR
INR: 1 (ref 0.9–1.1)
Protime: 11.7 s (ref 10.0–12.9)

## 2024-07-22 LAB — CBC AND DIFFERENTIAL
Baso # K/uL: 0 THOU/uL (ref 0.0–0.2)
Eos # K/uL: 0 THOU/uL (ref 0.0–0.5)
Hematocrit: 41 % (ref 37–52)
Hemoglobin: 14.4 g/dL (ref 12.0–17.0)
IMM Granulocytes #: 0 THOU/uL (ref 0–0)
IMM Granulocytes: 0.4 %
Lymph # K/uL: 0.7 THOU/uL — ABNORMAL LOW (ref 1.0–5.0)
MCV: 87 fL (ref 75–100)
Mono # K/uL: 0.4 THOU/uL (ref 0.1–1.0)
Neut # K/uL: 8.2 THOU/uL — ABNORMAL HIGH (ref 1.5–6.5)
Nucl RBC # K/uL: 0 THOU/uL
Nucl RBC %: 0 /100{WBCs} (ref 0.0–0.2)
Platelets: 277 THOU/uL (ref 150–450)
RBC: 4.7 MIL/uL (ref 4.0–6.0)
RDW: 11.7 % (ref 0.0–15.0)
Seg Neut %: 88.2 %
WBC: 9.3 THOU/uL (ref 3.5–11.0)

## 2024-07-22 LAB — COMPREHENSIVE METABOLIC PANEL
ALT: 41 U/L (ref 0–50)
AST: 40 U/L (ref 0–50)
Albumin: 5.2 g/dL (ref 3.5–5.2)
Alk Phos: 60 U/L (ref 40–130)
Anion Gap: 14 (ref 7–16)
Bilirubin,Total: 2.4 mg/dL — ABNORMAL HIGH (ref 0.0–1.2)
CO2: 26 mmol/L (ref 20–28)
Calcium: 10.2 mg/dL (ref 8.6–10.2)
Chloride: 99 mmol/L (ref 96–108)
Creatinine: 0.96 mg/dL (ref 0.67–1.17)
Glucose: 116 mg/dL — ABNORMAL HIGH (ref 60–99)
Lab: 10 mg/dL (ref 6–20)
Potassium: 4.2 mmol/L (ref 3.3–4.6)
Sodium: 139 mmol/L (ref 133–145)
Total Protein: 7.9 g/dL — ABNORMAL HIGH (ref 6.3–7.7)
eGFR BY CREAT: 96

## 2024-07-22 LAB — APTT: aPTT: 31.3 s (ref 25.8–37.9)

## 2024-07-22 LAB — MAGNESIUM: Magnesium: 1.8 mg/dL (ref 1.6–2.5)

## 2024-07-22 LAB — HOLD SST

## 2024-07-22 MED ORDER — SODIUM CHLORIDE 0.9 % IV BOLUS *I*
1000.0000 mL | Freq: Once | Status: AC
Start: 2024-07-22 — End: 2024-07-22
  Administered 2024-07-22: 1000 mL via INTRAVENOUS

## 2024-07-22 MED ORDER — IOHEXOL 350 MG/ML (OMNIPAQUE) IV SOLN *I*
70.0000 mL | Freq: Once | INTRAVENOUS | Status: AC
Start: 2024-07-22 — End: 2024-07-22
  Administered 2024-07-22: 70 mL via INTRAVENOUS

## 2024-07-22 MED ORDER — MORPHINE SULFATE 2 MG/ML IV SOLN *WRAPPED*
2.0000 mg | Freq: Once | Status: AC
Start: 2024-07-22 — End: 2024-07-22
  Administered 2024-07-22: 2 mg via INTRAVENOUS
  Filled 2024-07-22: qty 1

## 2024-07-22 MED ORDER — METOCLOPRAMIDE HCL 5 MG/ML IJ SOLN *I*
10.0000 mg | Freq: Once | INTRAMUSCULAR | Status: AC
Start: 2024-07-22 — End: 2024-07-22
  Administered 2024-07-22: 10 mg via INTRAVENOUS
  Filled 2024-07-22: qty 2

## 2024-07-22 MED ORDER — LABETALOL HCL 20 MG/4 ML IV SOLN WRAPPED *I*
10.0000 mg | Freq: Once | INTRAVENOUS | Status: AC
Start: 2024-07-22 — End: 2024-07-22
  Administered 2024-07-22: 10 mg via INTRAVENOUS
  Filled 2024-07-22: qty 20

## 2024-07-22 MED ORDER — DIPHENHYDRAMINE HCL 50 MG/ML IJ SOLN *I*
25.0000 mg | Freq: Once | INTRAMUSCULAR | Status: AC
Start: 2024-07-22 — End: 2024-07-22
  Administered 2024-07-22: 25 mg via INTRAVENOUS
  Filled 2024-07-22: qty 1

## 2024-07-22 MED ORDER — LABETALOL HCL 20 MG/4 ML IV SOLN WRAPPED *I*
10.0000 mg | Freq: Once | INTRAVENOUS | Status: AC
Start: 2024-07-22 — End: 2024-07-22
  Administered 2024-07-22: 10 mg via INTRAVENOUS

## 2024-07-22 NOTE — ED Notes (Signed)
 Discharge education reviewed with patient.  Patient verbalized understanding.  Denies any concerns or complaints at this time.

## 2024-07-22 NOTE — ED Provider Notes (Signed)
 History Chief Complaint Patient presents with  Headache 48 year old male patient history of radiation induced motor neuron disease, tonsillar cancer, neuropathy, hypertension, depression, who presents to the emergency room with a sudden onset of a headache while he was on a jet from [place] to [place] .  He states this occurred about 10 hours ago.  He states recently he has been having migraine type headaches and a similar headache to this a month and a half ago when he was visiting [place] seen in the emergency room given a migraine cocktail had a negative head CT other than finding a cavernoma without a bleed.  He states the headache resolved.Patient states he has been having episodes of syncope with follow-up with cardiology for several months.  He states that he is also having some progressive neurologic weakness.Patient states during today's event he denies any visual changes, states he began to vomit after taking a Vicodin.  Denies any abdominal pain chest pain shortness of breath.Headache: - Neck stiffness: absent - Persistent vomiting: absent - Syncope: absent - Maximum severity at onset: no - Onset during exertion: I checked with my friends.  There not ready to take on a new job right now sorry Hope you find a good home and I know how much you like 10 months are you having to get rid of allMedical/Surgical/Family History Past Medical History[1] Patient Active Problem List Diagnosis Code  Tonsillar cancer C09.9  History of cholecystectomy Z90.49  MVP (mitral valve prolapse) I34.1  Depression F32.A  Neuropathy G62.9  Chronic fatigue R53.82  Hypertension I10  ED (erectile dysfunction) N52.9  monitoring for cancer therapy related  cardiac dysfunction Z91.89  Syncope and collapse R55  Dyslipidemia E78.5  Hypothyroid E03.9  Dysphagia, unspecified type R13.10  Constipation, unspecified constipation type K59.00  Past  Surgical History[2] Social History[3]  Review of SystemsPhysical Exam Triage VitalsTriage Start: Start, (07/22/24 1112)  First Recorded BP: (!) 190/113, Resp: 18, Temp: 36.4 C (97.6 F), Temp src: Oral Oxygen Therapy SpO2: 99 %, Oximetry Source: Rt Hand, O2 Device: None (Room air), Heart Rate: (!) 116, (07/22/24 1113)  .First Pain Reported 0-10  Pain Scale: 10, Pain Location/Orientation: Head, (07/22/24 1113) Physical ExamVitals and nursing note reviewed. Constitutional:     General: He is not in acute distress.HENT:    Head: Normocephalic and atraumatic.    Nose: Nose normal. Eyes:    Extraocular Movements: Extraocular movements intact.    Conjunctiva/sclera: Conjunctivae normal.    Pupils: Pupils are equal, round, and reactive to light. Cardiovascular:    Rate and Rhythm: Regular rhythm. Tachycardia present.    Pulses: Normal pulses.    Heart sounds: Normal heart sounds. Pulmonary:    Effort: Pulmonary effort is normal.    Breath sounds: Normal breath sounds. Musculoskeletal:    Cervical back: Normal range of motion and neck supple. No tenderness. Skin:   Capillary Refill: Capillary refill takes less than 2 seconds.    Findings: No bruising or erythema. Neurological:    Mental Status: He is alert and oriented to person, place, and time.    Comments: No meningeal signs, ambulates without difficulty Psychiatric:       Mood and Affect: Mood normal.       Behavior: Behavior normal. Expanded Neurologic ExamCranial Nerves: II: PERRL, visual field exam: intactIII/IV/VI: EOM intact. No nystagmus. Eyelids open equal.V: Facial sensation symmetric to light touch VII: No Facial droop VIII: Hearing intact bilaterallyIX/X: Normal swallowing, normal voiceXI: Equal shoulder shrugXII: Tongue midlineAOx3Speech: NormalStrength intact x 4Sensation intact  x 4Pronator drift was absentCoordination: Finger to  nose intact Gait: intactTruncal Stability: able to sit upright Medical Decision Making Assessment:  48 year old male patient presents emergency department with sudden onset of a headache while on a long overnight flight from [place] to [place] .  States 2 hours into the flight he started with this headache on both sides of his head with subsequent vomiting.  Patient states that he took a Vicodin as well as lorazepam and symptoms did not improve.  Patient does have baseline weakness from a radiation induced motor neuron dysfunction after he was treated for neck cancer.  He states his symptoms have been progressing.  He he states he has had headaches over the last several months and this headache is similar to 1 that he had about 6 weeks ago while he was in [place].  Patient is hypertensive did not take any of his oral medicines due to the time change in the flight.  There was some concern by a neurologist on board the flight for a possible venous cavernous thrombosis as he has a history of a cavernoma.Differential diagnosis:  Intracranial bleed, migraine headache, venous cavernous thrombosis, uncontrolled hypertension, electrolyte abnormality,Plan:  Exam, nursing orders, CBC with differential, CMP, magnesium , coags, CT angio head, neuro consult, labetalol ,EKG Interpretation:  Sinus tachycardia rate of 105 no acute changes, tracing reviewed by myself, normal sinus rhythm, no ischemic changesED Course and Disposition:  48 year old male patient presents to the emergency department after a long flight out of [place] developed a headache.  Patient came into the emergency room hypertensive was given labetalol  with improvement of blood pressure Toradol  and morphine  for the headache and the headache is much better at this time.  Was not able to get an MRI emergently for the patient today therefore CT angio of the head was ordered and no evidence of a venous sinus  thrombosis at this time.  I had a conversation with neurology on a strong they stated that if the CTA was negative patient can be discharged home and follow-up with neurology.  Had a conversation with the patient as well as his family at 1645 hrs.  Shared decision making reveals that patient would like to attempt to go home Red flag warnings for return.  Patient is resting comfortably eat a meal and at this time is pain-free.  Blood pressure much better controlled after labetalol .  Patient can resume his routine medications at home and return if needed neurologically intact at baseline at the time of dischargeNeuro MDM: Justification for/against neuroimaging: Severe headacheED Course as of 07/22/24 1529 Tue Jul 22, 2024 1318 Checked with MRI at 1315 hrs. attempt to do what MRI with without contrast to rule out a venous cavernous thrombosis however there was no ability to do this today.  Patient was given a migraine cocktail who states his headache was an 8 now down to a 4 but still has a headache with associated nausea.  At this time we will give Toradol  as well as Zofran . 1332 Patient with his reevaluated at 1325 hrs.  Still has a 4 and 10 headache and hypertensive therefore at this time we will give him some IV labetalol . [facility] transfer center notified for neurology consult request 83 Spoke with neurology through the transfer center.  Discussed CT angio head if this is negative and blood pressure and pain are controlled patient can be discharged and follow-up with his neurologist. 1529 Patient did receive the morphine  pain is better. Taft Worthing, PA  [1]  Past Medical History:Diagnosis Date  Cancer   Throat  Depression   High blood pressure   Hypertension   MVP (mitral valve prolapse)   Orthostatic hypotension  [2] Past Surgical History:Procedure Laterality Date  ELBOW FRACTURE SURGERY    GALLBLADDER SURGERY  2019  HERNIA REPAIR  2019   umbilical  NECK SURGERY Left 2018  radical dissection  TONSILLECTOMY  2017  [facility] Left tonsil [3] Social HistoryTobacco Use  Smoking status: Never  Smokeless tobacco: Never Substance Use Topics  Alcohol use: Yes   Alcohol/week: 1.0 standard drink of alcohol   Types: 1 Glasses of wine per week  Drug use: Not Currently  Omega Ivy, PA10/16/25 2014

## 2024-07-22 NOTE — Discharge Instructions (Signed)
 You came into the emergency room today with a headache as well as elevated blood pressure.  You were given labetalol  which is an IV medication for high blood pressure your blood pressure did improve as well as your headache after medications of Toradol  and morphine .CAT scan your head did not show any acute abnormalities other than the cavernous thrombosis that you have been known to have.Please follow-up with your neurologist and cardiologist as scheduled.  Resume your routine medications return back to the emergency department if you develop any severe pain, nausea or vomiting.

## 2024-07-22 NOTE — Provider Consult (Signed)
 Telestroke Telephone Consultation Note Patient:  James CentorePatient's DOB:  February 12, 1977eMRN:  [id number] Physician/APP:  [facility]:  [facility] HospitalI spoke with the provider on 07/22/2024 at 1510 and received the following information.History Obtained Vahan Platter is a 48 y.o. 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  He presented with headache and nausea/vomiting.  The patient was last known well (LKW) about 10-12 hours ago.  He was flying back from [place] and around 2 hours into the flight he developed a severe headache associated with some nausea.  He was evaluated by some physicians on his flight who raised a concern for venous sinus thrombosis, and he came to the emergency department for evaluation upon landing.  He has been treated empirically with some blood pressure control and migraine abortive treatments with improvement in his headache severity from 8/10 to 4/10.  No focal deficits noted by patient.He does have a history of some similar headaches in the past, including a presentation to [place] in August where a CT/CTA was unrevealing.  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  NIHSS was not provided.Stroke/TIA-Specific Data I personally reviewed the pertinent radiology studies.CT head: No ICH or early signs of infarction.  There is a hypodensity in the left lateral basal ganglia that appears most consistent with a dilated perivascular space.His prior MRI brain from December 2024 was also personally reviewed.Impression and Recommendations The cause of this patient's clinical presentation and headache is not entirely clear at the present time.  That being said, my suspicion for a venous sinus thrombosis is not particularly  high, though could be ruled out further by obtaining a CT angiogram to ensure patency of his major dural sinuses.  The fact that he has had similar presentations in the past does raise the possibility of migraine.I recommended the following disposition and management:- Obtain CT angiogram head while in the emergency department.  Can continue with empiric migraine abortive therapies in the meantime.- If his CTA is unrevealing, and his symptoms improve, can likely be discharged home with follow-up in local neurology clinic with Dr. Sollero, whom he has seen in the past.I advised the provider to reach back out to our service if the patient's neurological status changes or any questions arise. The provider understood and agreed with this impression and these recommendations.Author: JULIENE SOR, MD  as of: 07/22/2024  at: 3:20 PMNeurology Attending   of [facility]

## 2024-07-22 NOTE — ED Triage Notes (Signed)
 Pt arrives to ED with chief complaint of headache for about 10 hours. Pt states recently traveled here from [place]. Had this happen 1 month ago as well.

## 2024-07-25 ENCOUNTER — Ambulatory Visit

## 2024-07-29 MED ORDER — GENERIC DME *A*
0 refills | Status: AC
Start: 2024-07-29 — End: ?

## 2024-07-29 NOTE — Addendum Note (Signed)
 Addended byBETHA JANUS SINCLAIR on: 07/29/2024 04:20 PM Modules accepted: Orders

## 2024-08-05 LAB — EKG 12-LEAD
P: 61 deg
PR: 179 ms
QRS: 93 deg
QRSD: 107 ms
QT: 351 ms
QTc: 464 ms
Rate: 105 {beats}/min
T: 130 deg

## 2024-08-19 ENCOUNTER — Other Ambulatory Visit: Payer: Self-pay | Admitting: Primary Care

## 2024-09-02 ENCOUNTER — Ambulatory Visit: Payer: Self-pay | Admitting: Speech-Language Pathologist

## 2024-09-02 ENCOUNTER — Ambulatory Visit: Payer: Medicare Other | Admitting: Otolaryngology

## 2024-09-08 ENCOUNTER — Ambulatory Visit

## 2024-09-16 ENCOUNTER — Encounter: Payer: Self-pay | Admitting: Primary Care

## 2024-09-16 ENCOUNTER — Other Ambulatory Visit: Payer: Self-pay

## 2024-09-16 ENCOUNTER — Ambulatory Visit: Attending: Primary Care | Admitting: Primary Care

## 2024-09-16 VITALS — BP 99/69 | HR 107 | Temp 98.8°F | Ht 70.0 in | Wt 186.0 lb

## 2024-09-16 DIAGNOSIS — Z20822 Contact with and (suspected) exposure to covid-19: Secondary | ICD-10-CM | POA: Insufficient documentation

## 2024-09-16 DIAGNOSIS — J029 Acute pharyngitis, unspecified: Secondary | ICD-10-CM | POA: Insufficient documentation

## 2024-09-16 LAB — POCT AMBULATORY RAPID STREP
Lot #: 937756
Rapid Strep Group A Throat-POC: NEGATIVE

## 2024-09-16 NOTE — Progress Notes (Signed)
 Patient ID: James Oneill is a 48 y.o. year old male.Chief Complaint Patient presents with  Follow-up   Eval sore throat/cough/ear pain/fever YEP:[name] is here this morning reporting been unwell since yesterday with sore throat radiating to both ears.  A cough productive of yellow sputum.  Chills.Had a fever and diaphoresis this morning which has since resolved.  He feels more fatigued than at baseline.  He has generalized myalgiasNo influenza vaccination this yearNo contact with anybody else with similar symptomsThe medication and allergy list was reviewed and is accurateAllergy / Social History / Medications:Allergies[1]Social History Tobacco Use  Smoking status: Never  Smokeless tobacco: Never Substance Use Topics  Alcohol use: Yes   Alcohol/week: 1.0 standard drink of alcohol   Types: 1 Glasses of wine per week Patient's Medications New Prescriptions  No medications on file Previous Medications  ACETAMINOPHEN  (TYLENOL ) 325 MG TABLET    Take 2 tablets (650 mg total) by mouth every 6 hours as needed.  ATORVASTATIN  (LIPITOR) 40 MG TABLET    Take 1 tablet (40 mg total) by mouth daily.  CARVEDILOL  (COREG ) 6.25 MG TABLET    Take 1 tablet (6.25 mg total) by mouth 2 times daily (with meals) for High Blood Pressure.  CYCLOBENZAPRINE  (FLEXERIL ) 5 MG TABLET    Take 1 tablet (5 mg total) by mouth 3 times daily as needed for Muscle spasms.  ESOMEPRAZOLE  MAGNESIUM  (NEXIUM ) 40 MG CAPSULE    Take 1 capsule (40 mg total) by mouth daily (before breakfast).  FLUOXETINE  (PROZAC ) 40 MG CAPSULE    Take 1 capsule (40 mg total) by mouth daily.  FLUTICASONE  (FLONASE ) 50 MCG/ACT NASAL SPRAY    Spray 1 spray into nostril daily  GABAPENTIN  600 MG TABLET    TAKE 1 TABLET BY MOUTH THREE TIMES DAILY  GENERIC DME    Item to dispense: cock up wrist brace  Instructions for use: daily  GENERIC DME    Item to dispense: sling  Instructions for use: daily   HYDROCODONE -ACETAMINOPHEN  (NORCO) 5-325 MG PER TABLET    Take 1 tablet by mouth every 6 hours as needed for Pain. Max daily dose: 4 tablets  LEVOTHYROXINE  (SYNTHROID , LEVOTHROID) 25 MCG TABLET    TAKE 1 TABLET BY MOUTH EVERY DAY BEFORE BREAKFAST  LIDOCAINE  (LIDODERM ) 5 % PATCH    Apply 1 patch onto the skin every 24 hours over 12 hours. to the following areas: left thigh pain area. Remove and discard patch within 12 hours or as directed.  MUPIROCIN  (BACTROBAN ) 2 % OINTMENT    Apply topically daily for Inflammation of Hair Follicles in the Nose. to the following areas: nose - only if area still infected  NON-SYSTEM MEDICATION    Medication/Supply: below knee compression stockingsDirections for Use: remove at night  NON-SYSTEM MEDICATION    Medication/Supply: Electric hospital bed  NON-SYSTEM MEDICATION    Medication/Supply: Hospital bed with electric operated headrestDirections for Use:  OXYCODONE  (ROXICODONE ) 5 MG IMMEDIATE RELEASE TABLET    Take 1 tablet (5 mg total) by mouth every 4-6 hours as needed for Pain. Max daily dose: 30 mg  QUETIAPINE  (SEROQUEL ) 50 MG TABLET    Take 1 tablet (50 mg total) by mouth nightly. Modified Medications  No medications on file Discontinued Medications  No medications on file  Physical Exam:Vitals:  09/16/24 1033 BP: 99/69 Pulse: 107 Temp: 37.1 C (98.8 F) Weight: 84.4 kg (186 lb) Height: 1.778 m (5' 10) SpO2 Readings from Last 3 Encounters: 09/16/24 96% 07/22/24 97% 06/27/24 98%  Estimated body mass  index is 26.69 kg/m as calculated from the following:  Height as of this encounter: 1.778 m (5' 10).  Weight as of this encounter: 84.4 kg (186 lb).BP Readings from Last 3 Encounters: 09/16/24 99/69 07/22/24 (!) 128/95 06/27/24 118/86 Wt Readings from Last 3 Encounters: 09/16/24 84.4 kg (186 lb) 07/22/24 81.6 kg (180 lb) 06/27/24 81.1 kg (178 lb 12.8 oz) Looks well.  Not in any acute  distressENT-normalNeck-supple no adenopathy or thyromegalyCardiovascular-heart rate regular.  Heart sounds 1 and 2 with no added sounds no carotid bruits no edemaRespiratory-normal respiratory effort.  Good bilateral air entry.  Lungs are clear to palpation percussion and auscultationRecent Lab Results: Rapid strep negativeAssessment and Plan: Sore throat.  Likely viral upper respiratory infection.Recommend symptomatic treatment pending results of influenza/COVIDWill call him with results tomorrowOrders this visitOrders Placed This Encounter Procedures  COVID/Influenza A & B/RSV NAAT (PCR)  POCT ambulatory rapid strep  [1] AllergiesAllergen Reactions  Shellfish Protein-Containing Drug Products Hives

## 2024-09-16 NOTE — Addendum Note (Signed)
 Addended by: BONNEY BRUCKNER on: 09/16/2024 11:01 AM Modules accepted: Orders

## 2024-09-16 NOTE — Addendum Note (Signed)
 Addended by: Jenel Gierke on: 09/16/2024 01:06 PM Modules accepted: Orders

## 2024-09-17 ENCOUNTER — Ambulatory Visit: Payer: Self-pay | Admitting: Primary Care

## 2024-09-17 LAB — COVID/INFLUENZA A & B/RSV NAAT (PCR)
COVID-19 NAAT (PCR): NEGATIVE
Influenza A NAAT (PCR): POSITIVE — AB
Influenza B NAAT (PCR): NEGATIVE
RSV NAAT (PCR): NEGATIVE

## 2024-09-17 MED ORDER — OSELTAMIVIR PHOSPHATE 75 MG PO CAPS *I*: 75.0000 mg | ORAL_CAPSULE | Freq: Two times a day (BID) | ORAL | 0 refills | Status: DC

## 2024-09-17 NOTE — Result Encounter Note (Signed)
Called patient and relayed provider message. Patient verbalized understanding.

## 2024-09-17 NOTE — Telephone Encounter (Signed)
 Positive for influenza A.I am sending prescription for an antiviral medication to the pharmacy for him.  Please start taking as soon as he can

## 2024-09-18 LAB — STREP A CULTURE, THROAT: Group A Strep Throat Culture: 0

## 2024-09-20 ENCOUNTER — Other Ambulatory Visit: Payer: Self-pay | Admitting: Primary Care

## 2024-09-20 ENCOUNTER — Other Ambulatory Visit: Payer: Self-pay

## 2024-09-20 DIAGNOSIS — J329 Chronic sinusitis, unspecified: Secondary | ICD-10-CM

## 2024-09-22 MED ORDER — HYDROCODONE-ACETAMINOPHEN 5-325 MG PO TABS *I*: 1.0000 | ORAL_TABLET | Freq: Four times a day (QID) | ORAL | 0 refills | Status: DC | PRN

## 2024-09-22 MED ORDER — CYCLOBENZAPRINE HCL 5 MG PO TABS *I*: 5.0000 mg | ORAL_TABLET | Freq: Three times a day (TID) | ORAL | 2 refills | Status: DC | PRN

## 2024-09-22 MED ORDER — FLUTICASONE PROPIONATE 50 MCG/ACT NA SUSP *I*: 1.0000 | Freq: Every day | NASAL | 5 refills | Status: AC

## 2024-09-22 MED ORDER — LEVOTHYROXINE SODIUM 25 MCG PO TABS *I*: 25.0000 ug | ORAL_TABLET | Freq: Every day | ORAL | 2 refills | Status: DC

## 2024-09-22 NOTE — Telephone Encounter (Signed)
 Last disp date 06/30/2024 28 tabs for 7 days

## 2024-09-25 ENCOUNTER — Ambulatory Visit: Payer: Self-pay | Admitting: Student in an Organized Health Care Education/Training Program

## 2024-10-03 ENCOUNTER — Other Ambulatory Visit
Admission: RE | Admit: 2024-10-03 | Discharge: 2024-10-03 | Disposition: A | Discharge status: Home / Self Care | Source: Ambulatory Visit | Attending: Primary Care | Admitting: Primary Care

## 2024-10-03 ENCOUNTER — Encounter: Payer: Self-pay | Admitting: Primary Care

## 2024-10-03 ENCOUNTER — Ambulatory Visit: Payer: Self-pay | Attending: Primary Care | Admitting: Primary Care

## 2024-10-03 ENCOUNTER — Other Ambulatory Visit: Payer: Self-pay

## 2024-10-03 VITALS — BP 104/73 | HR 82 | Temp 97.4°F | Ht 70.0 in | Wt 185.2 lb

## 2024-10-03 DIAGNOSIS — E785 Hyperlipidemia, unspecified: Secondary | ICD-10-CM | POA: Insufficient documentation

## 2024-10-03 DIAGNOSIS — E039 Hypothyroidism, unspecified: Secondary | ICD-10-CM | POA: Insufficient documentation

## 2024-10-03 DIAGNOSIS — R0989 Other specified symptoms and signs involving the circulatory and respiratory systems: Secondary | ICD-10-CM | POA: Insufficient documentation

## 2024-10-03 DIAGNOSIS — G9589 Other specified diseases of spinal cord: Secondary | ICD-10-CM | POA: Insufficient documentation

## 2024-10-03 DIAGNOSIS — K219 Gastro-esophageal reflux disease without esophagitis: Secondary | ICD-10-CM | POA: Insufficient documentation

## 2024-10-03 LAB — COMPREHENSIVE METABOLIC PANEL
ALT: 29 U/L (ref 0–50)
ALT: 29 U/L (ref 0–50)
AST: 30 U/L (ref 0–50)
AST: 30 U/L (ref 0–50)
Albumin: 4.6 g/dL (ref 3.5–5.2)
Albumin: 4.6 g/dL (ref 3.5–5.2)
Alk Phos: 53 U/L (ref 40–130)
Alk Phos: 53 U/L (ref 40–130)
Anion Gap: 12 (ref 7–16)
Anion Gap: 12 (ref 7–16)
Bilirubin,Total: 2.1 mg/dL — ABNORMAL HIGH (ref 0.0–1.2)
Bilirubin,Total: 2.1 mg/dL — ABNORMAL HIGH (ref 0.0–1.2)
CO2: 26 mmol/L (ref 20–28)
CO2: 26 mmol/L (ref 20–28)
Calcium: 9.7 mg/dL (ref 8.6–10.2)
Calcium: 9.7 mg/dL (ref 8.6–10.2)
Chloride: 102 mmol/L (ref 96–108)
Chloride: 102 mmol/L (ref 96–108)
Creatinine: 1.18 mg/dL — ABNORMAL HIGH (ref 0.67–1.17)
Creatinine: 1.18 mg/dL — ABNORMAL HIGH (ref 0.67–1.17)
Glucose: 103 mg/dL — ABNORMAL HIGH (ref 60–99)
Glucose: 103 mg/dL — ABNORMAL HIGH (ref 60–99)
Lab: 14 mg/dL (ref 6–20)
Lab: 14 mg/dL (ref 6–20)
Potassium: 4.7 mmol/L — ABNORMAL HIGH (ref 3.3–4.6)
Potassium: 4.7 mmol/L — ABNORMAL HIGH (ref 3.3–4.6)
Sodium: 140 mmol/L (ref 133–145)
Sodium: 140 mmol/L (ref 133–145)
Total Protein: 6.9 g/dL (ref 6.3–7.7)
Total Protein: 6.9 g/dL (ref 6.3–7.7)
eGFR BY CREAT: 75
eGFR BY CREAT: 75

## 2024-10-03 LAB — LIPID PANEL
Chol/HDL Ratio: 3.2
Chol/HDL Ratio: 3.2
Cholesterol: 132 mg/dL
Cholesterol: 132 mg/dL
HDL: 41 mg/dL (ref 40–60)
HDL: 41 mg/dL (ref 40–60)
LDL Calculated: 55 mg/dL
LDL Calculated: 55 mg/dL
Non HDL Cholesterol: 91 mg/dL
Non HDL Cholesterol: 91 mg/dL
Triglycerides: 221 mg/dL — AB
Triglycerides: 221 mg/dL — AB

## 2024-10-03 LAB — TSH
TSH: 3.89 u[IU]/mL (ref 0.27–4.20)
TSH: 3.89 u[IU]/mL (ref 0.27–4.20)

## 2024-10-03 LAB — T4, FREE
Free T4: 1.2 ng/dL (ref 0.9–1.7)
Free T4: 1.2 ng/dL (ref 0.9–1.7)

## 2024-10-03 NOTE — Progress Notes (Signed)
 Patient ID: James Oneill is a 48 y.o. year old male.Chief Complaint Patient presents with  Follow-up   Three month followup James Oneill is here today for reevaluation of his hypertension hypothyroidism hyperlipidemia and GERDHe continues to experience chronic left neck and shoulder pain.  He is using OTC lidocaine  patches which do help.  On average he needs to take hydrocodone  twice a week for breakthrough painNo chest pain palpitations dyspnea thumping or PNDAcid reflux is well-controlled.  No dysphagiaWeight is stableThe medication and allergy list was reviewed and is accurateAllergy / Social History / Medications:Allergies[1]Social History Tobacco Use  Smoking status: Never  Smokeless tobacco: Never Substance Use Topics  Alcohol use: Yes   Alcohol/week: 1.0 standard drink of alcohol   Types: 1 Glasses of wine per week Patient's Medications New Prescriptions  No medications on file Previous Medications  ACETAMINOPHEN  (TYLENOL ) 325 MG TABLET    Take 2 tablets (650 mg total) by mouth every 6 hours as needed.  ATORVASTATIN  (LIPITOR) 40 MG TABLET    Take 1 tablet (40 mg total) by mouth daily.  CARVEDILOL  (COREG ) 6.25 MG TABLET    Take 1 tablet (6.25 mg total) by mouth 2 times daily (with meals) for High Blood Pressure.  CYCLOBENZAPRINE  (FLEXERIL ) 5 MG TABLET    Take 1 tablet (5 mg total) by mouth 3 times daily as needed for Muscle spasms.  ESOMEPRAZOLE  MAGNESIUM  (NEXIUM ) 40 MG CAPSULE    Take 1 capsule (40 mg total) by mouth daily (before breakfast).  FLUOXETINE  (PROZAC ) 40 MG CAPSULE    Take 1 capsule (40 mg total) by mouth daily.  FLUTICASONE  (FLONASE ) 50 MCG/ACT NASAL SPRAY    Spray 1 spray into nostril daily.  GABAPENTIN  300 MG CAPSULE    Take 1 capsule (300 mg total) by mouth 3 times daily.  GABAPENTIN  600 MG TABLET    TAKE 1 TABLET BY MOUTH THREE TIMES DAILY  GENERIC DME    Item to dispense: cock up wrist brace  Instructions for  use: daily  GENERIC DME    Item to dispense: sling  Instructions for use: daily  HYDROCODONE -ACETAMINOPHEN  (NORCO) 5-325 MG PER TABLET    Take 1 tablet by mouth every 6 hours as needed for Pain. Max daily dose: 4 tablets  LEVOTHYROXINE  (SYNTHROID , LEVOTHROID) 25 MCG TABLET    Take 1 tablet (25 mcg total) by mouth daily (before breakfast).  LIDOCAINE  (LIDODERM ) 5 % PATCH    Apply 1 patch onto the skin every 24 hours over 12 hours. to the following areas: left thigh pain area. Remove and discard patch within 12 hours or as directed.  MUPIROCIN  (BACTROBAN ) 2 % OINTMENT    Apply topically daily for Inflammation of Hair Follicles in the Nose. to the following areas: nose - only if area still infected  NON-SYSTEM MEDICATION    Medication/Supply: below knee compression stockingsDirections for Use: remove at night  NON-SYSTEM MEDICATION    Medication/Supply: Electric hospital bed  NON-SYSTEM MEDICATION    Medication/Supply: Hospital bed with electric operated headrestDirections for Use:  QUETIAPINE  (SEROQUEL ) 50 MG TABLET    Take 1 tablet (50 mg total) by mouth nightly. Modified Medications  No medications on file Discontinued Medications  OSELTAMIVIR  (TAMIFLU ) 75 MG CAPSULE    Take 1 capsule (75 mg total) by mouth 2 times daily for Influenza A Virus Infection.  OXYCODONE  (ROXICODONE ) 5 MG IMMEDIATE RELEASE TABLET    Take 1 tablet (5 mg total) by mouth every 4-6 hours as needed for Pain. Max daily dose: 30 mg  Physical Exam:Vitals:  10/03/24 1009 BP: 104/73 Pulse: 82 Temp: 36.3 C (97.4 F) Weight: 84 kg (185 lb 3.2 oz) Height: 1.778 m (5' 10) SpO2 Readings from Last 3 Encounters: 10/03/24 97% 09/16/24 96% 07/22/24 97%  Estimated body mass index is 26.57 kg/m as calculated from the following:  Height as of this encounter: 1.778 m (5' 10).  Weight as of this encounter: 84 kg (185 lb 3.2 oz).BP Readings from Last 3 Encounters: 10/03/24 104/73  09/16/24 99/69 07/22/24 (!) 128/95 Wt Readings from Last 3 Encounters: 10/03/24 84 kg (185 lb 3.2 oz) 09/16/24 84.4 kg (186 lb) 07/22/24 81.6 kg (180 lb) Looks well.  Not in any acute distressNeck-supple.  Induration left side of neck.  No adenopathy or thyromegalyCardiovascular-heart rate regular.  Heart sounds 1 and 2 with no added sounds no carotid bruits no edemaRespiratory-normal respiratory effort.  Good bilateral air entry.  Lungs are clear to palpation percussion and auscultationRecent Lab Results:noneAssessment and Plan: 1.  Labile hypertension2.  Hyperlipidemia.  Check lipid3.  Hypothyroidism.  Check TSH free T44.  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

## 2024-10-05 ENCOUNTER — Ambulatory Visit: Payer: Self-pay | Admitting: Primary Care

## 2024-10-06 NOTE — Result Encounter Note (Signed)
 Called patient and left VM to have patient call office back

## 2024-10-08 NOTE — Result Encounter Note (Signed)
 Patient notified

## 2024-10-21 ENCOUNTER — Other Ambulatory Visit: Payer: Self-pay | Admitting: Primary Care

## 2024-10-21 ENCOUNTER — Other Ambulatory Visit: Payer: Self-pay

## 2024-10-21 MED ORDER — FLUOXETINE HCL 40 MG PO CAPS *I*: 40.0000 mg | ORAL_CAPSULE | Freq: Every day | ORAL | 2 refills | Status: AC

## 2024-10-21 MED ORDER — ESOMEPRAZOLE MAGNESIUM 40 MG PO CPDR *I*: 40.0000 mg | DELAYED_RELEASE_CAPSULE | Freq: Every day | ORAL | 2 refills | Status: AC

## 2024-10-21 MED ORDER — HYDROCODONE-ACETAMINOPHEN 5-325 MG PO TABS *I*: 1.0000 | ORAL_TABLET | Freq: Four times a day (QID) | ORAL | 0 refills | Status: AC | PRN

## 2024-10-21 MED ORDER — CYCLOBENZAPRINE HCL 5 MG PO TABS *I*: 5.0000 mg | ORAL_TABLET | Freq: Three times a day (TID) | ORAL | 2 refills | Status: AC | PRN

## 2024-10-21 MED ORDER — QUETIAPINE FUMARATE 50 MG PO TABS *I*: 50.0000 mg | ORAL_TABLET | Freq: Every evening | ORAL | 2 refills | Status: AC

## 2024-10-21 MED ORDER — GABAPENTIN 600 MG PO TABLET *I*: 600.0000 mg | ORAL_TABLET | Freq: Three times a day (TID) | ORAL | 2 refills | Status: AC

## 2024-10-21 NOTE — Telephone Encounter (Signed)
 Last appointment: 12/19/2025Next appointment: Future Appointments Date Time Provider Department Center 12/22/2024  4:30 PM Shelia Phoebe Eric Norman, MD [facility] None 01/15/2025 10:30 AM Reatha Dover, MD [facility] None 01/19/2025  9:00 AM Leamon Bottoms, MD [facility] None 06/23/2025 10:00 AM Cleotilde Cough, MD [facility] None

## 2024-11-19 ENCOUNTER — Other Ambulatory Visit: Payer: Self-pay | Admitting: Primary Care

## 2024-12-22 ENCOUNTER — Ambulatory Visit: Admitting: Neurology

## 2025-01-15 ENCOUNTER — Ambulatory Visit: Payer: Self-pay | Admitting: Student in an Organized Health Care Education/Training Program

## 2025-01-19 ENCOUNTER — Ambulatory Visit: Payer: Self-pay | Admitting: Primary Care

## 2025-06-23 ENCOUNTER — Ambulatory Visit: Payer: Self-pay | Admitting: Otolaryngology
